# Patient Record
Sex: Male | Born: 1940 | Race: Black or African American | Hispanic: No | Smoking: Never smoker
Health system: Southern US, Community
[De-identification: ages and names within clinical notes are randomized; demographics above are authoritative.]

## PROBLEM LIST (undated history)

## (undated) DIAGNOSIS — E119 Type 2 diabetes mellitus without complications: Secondary | ICD-10-CM

## (undated) DIAGNOSIS — R519 Headache, unspecified: Secondary | ICD-10-CM

## (undated) DIAGNOSIS — R351 Nocturia: Secondary | ICD-10-CM

## (undated) DIAGNOSIS — N401 Enlarged prostate with lower urinary tract symptoms: Secondary | ICD-10-CM

## (undated) DIAGNOSIS — H409 Unspecified glaucoma: Secondary | ICD-10-CM

## (undated) DIAGNOSIS — M1990 Unspecified osteoarthritis, unspecified site: Secondary | ICD-10-CM

## (undated) DIAGNOSIS — N138 Other obstructive and reflux uropathy: Secondary | ICD-10-CM

## (undated) DIAGNOSIS — N210 Calculus in bladder: Secondary | ICD-10-CM

## (undated) DIAGNOSIS — C61 Malignant neoplasm of prostate: Secondary | ICD-10-CM

## (undated) DIAGNOSIS — R51 Headache: Secondary | ICD-10-CM

## (undated) DIAGNOSIS — N481 Balanitis: Secondary | ICD-10-CM

## (undated) HISTORY — DX: Benign prostatic hyperplasia with lower urinary tract symptoms: N13.8

## (undated) HISTORY — PX: COLONOSCOPY: SHX174

## (undated) HISTORY — DX: Unspecified glaucoma: H40.9

## (undated) HISTORY — DX: Malignant neoplasm of prostate: C61

## (undated) HISTORY — DX: Calculus in bladder: N21.0

## (undated) HISTORY — DX: Other obstructive and reflux uropathy: N40.1

## (undated) HISTORY — PX: EYE SURGERY: SHX253

## (undated) HISTORY — DX: Nocturia: R35.1

## (undated) HISTORY — DX: Balanitis: N48.1

## (undated) HISTORY — DX: Type 2 diabetes mellitus without complications: E11.9

---
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
## 1965-11-02 HISTORY — PX: HERNIA REPAIR: SHX51

## 2000-11-02 HISTORY — PX: CARDIAC CATHETERIZATION: SHX172

## 2004-08-02 ENCOUNTER — Encounter: Payer: Self-pay | Admitting: Internal Medicine

## 2004-09-02 ENCOUNTER — Encounter: Payer: Self-pay | Admitting: Internal Medicine

## 2004-10-06 ENCOUNTER — Encounter: Payer: Self-pay | Admitting: Internal Medicine

## 2004-10-08 ENCOUNTER — Ambulatory Visit: Payer: Self-pay

## 2004-11-02 ENCOUNTER — Encounter: Payer: Self-pay | Admitting: Internal Medicine

## 2004-12-03 ENCOUNTER — Encounter: Payer: Self-pay | Admitting: Internal Medicine

## 2004-12-31 ENCOUNTER — Encounter: Payer: Self-pay | Admitting: Internal Medicine

## 2005-01-31 ENCOUNTER — Encounter: Payer: Self-pay | Admitting: Internal Medicine

## 2005-03-02 ENCOUNTER — Encounter: Payer: Self-pay | Admitting: Internal Medicine

## 2005-04-02 ENCOUNTER — Encounter: Payer: Self-pay | Admitting: Internal Medicine

## 2005-05-02 ENCOUNTER — Encounter: Payer: Self-pay | Admitting: Internal Medicine

## 2005-06-02 ENCOUNTER — Encounter: Payer: Self-pay | Admitting: Internal Medicine

## 2005-07-03 ENCOUNTER — Encounter: Payer: Self-pay | Admitting: Internal Medicine

## 2005-08-02 ENCOUNTER — Encounter: Payer: Self-pay | Admitting: Internal Medicine

## 2005-09-02 ENCOUNTER — Encounter: Payer: Self-pay | Admitting: Internal Medicine

## 2005-10-02 ENCOUNTER — Encounter: Payer: Self-pay | Admitting: Internal Medicine

## 2005-11-02 ENCOUNTER — Encounter: Payer: Self-pay | Admitting: Internal Medicine

## 2005-12-03 ENCOUNTER — Encounter: Payer: Self-pay | Admitting: Internal Medicine

## 2005-12-31 ENCOUNTER — Encounter: Payer: Self-pay | Admitting: Internal Medicine

## 2006-01-31 ENCOUNTER — Encounter: Payer: Self-pay | Admitting: Internal Medicine

## 2006-03-02 ENCOUNTER — Encounter: Payer: Self-pay | Admitting: Internal Medicine

## 2006-04-02 ENCOUNTER — Encounter: Payer: Self-pay | Admitting: Internal Medicine

## 2007-09-12 DIAGNOSIS — I10 Essential (primary) hypertension: Secondary | ICD-10-CM | POA: Insufficient documentation

## 2008-01-04 ENCOUNTER — Emergency Department: Payer: Self-pay | Admitting: Emergency Medicine

## 2008-01-12 DIAGNOSIS — R6882 Decreased libido: Secondary | ICD-10-CM | POA: Insufficient documentation

## 2008-02-21 ENCOUNTER — Ambulatory Visit (HOSPITAL_COMMUNITY): Admission: RE | Admit: 2008-02-21 | Discharge: 2008-02-21 | Payer: Self-pay | Admitting: Specialist

## 2009-02-11 DIAGNOSIS — N400 Enlarged prostate without lower urinary tract symptoms: Secondary | ICD-10-CM | POA: Insufficient documentation

## 2009-04-03 DIAGNOSIS — I2510 Atherosclerotic heart disease of native coronary artery without angina pectoris: Secondary | ICD-10-CM | POA: Insufficient documentation

## 2009-06-21 DIAGNOSIS — K2100 Gastro-esophageal reflux disease with esophagitis, without bleeding: Secondary | ICD-10-CM | POA: Insufficient documentation

## 2010-05-13 ENCOUNTER — Ambulatory Visit: Payer: Self-pay | Admitting: Family Medicine

## 2010-06-25 ENCOUNTER — Ambulatory Visit: Payer: Self-pay | Admitting: Ophthalmology

## 2010-08-20 DIAGNOSIS — R634 Abnormal weight loss: Secondary | ICD-10-CM | POA: Insufficient documentation

## 2011-02-25 LAB — HM COLONOSCOPY: HM Colonoscopy: ABNORMAL

## 2011-03-17 NOTE — Procedures (Signed)
EEG NUMBER:  [id number].   HISTORY:  The patient is a 70 year old with a post concussive headache  and occipital neuralgia.  The patient fell on ice January 04, 2008,  striking the back of his head and the right side and has had a daily  headache since that time. (784.0)   PROCEDURE:  The tracing is carried out on a 32-channel digital Cadwell  recorder reformatted into 16 channel montages with 1 devoted to EKG.  The patient was awake and asleep during the recording.  Medications  include Fortamet , Zetia and Altace.   DESCRIPTION OF FINDINGS:  The dominant frequency is a 9 Hz, 40-50  microvolt activity that is well-regulated.  Background activity is  predominantly alpha and beta range components with essentially  predominant 10 Hz, 80 microvolt activity.  The patient drifts into  natural sleep with polymorphic delta range activity, vertex sharp waves  and fragmentary sleep spindles.  Prior to that the patient undergoes  hyperventilation, which caused rhythmic slowing into the delta range.   There was no interictal epileptiform activity in the form of spikes or  sharp waves.  EKG showed a regular sinus rhythm with ventricular  responsive 66 beats per minute.   IMPRESSION:  Normal record with the patient awake and asleep.      Deanna Artis. Sharene Skeans, M.D.  Electronically Signed     ZOX:[id number]  D:  02/21/2008 20:57:04  T:  02/21/2008 21:57:08  Job #:  [id number]   cc:   Annia Friendly. Loleta Chance, MD  Fax: [phone]

## 2011-04-08 ENCOUNTER — Ambulatory Visit: Payer: Self-pay | Admitting: Family Medicine

## 2011-10-24 ENCOUNTER — Emergency Department: Payer: Self-pay | Admitting: Emergency Medicine

## 2011-11-06 DIAGNOSIS — N210 Calculus in bladder: Secondary | ICD-10-CM | POA: Diagnosis not present

## 2011-11-25 DIAGNOSIS — K4020 Bilateral inguinal hernia, without obstruction or gangrene, not specified as recurrent: Secondary | ICD-10-CM | POA: Diagnosis not present

## 2011-12-16 DIAGNOSIS — N210 Calculus in bladder: Secondary | ICD-10-CM | POA: Diagnosis not present

## 2011-12-16 DIAGNOSIS — N200 Calculus of kidney: Secondary | ICD-10-CM | POA: Diagnosis not present

## 2011-12-16 DIAGNOSIS — R310 Gross hematuria: Secondary | ICD-10-CM | POA: Diagnosis not present

## 2011-12-16 DIAGNOSIS — N281 Cyst of kidney, acquired: Secondary | ICD-10-CM | POA: Diagnosis not present

## 2012-01-12 DIAGNOSIS — E119 Type 2 diabetes mellitus without complications: Secondary | ICD-10-CM | POA: Diagnosis not present

## 2012-01-12 DIAGNOSIS — J018 Other acute sinusitis: Secondary | ICD-10-CM | POA: Diagnosis not present

## 2012-01-12 DIAGNOSIS — H6690 Otitis media, unspecified, unspecified ear: Secondary | ICD-10-CM | POA: Diagnosis not present

## 2012-01-12 DIAGNOSIS — J029 Acute pharyngitis, unspecified: Secondary | ICD-10-CM | POA: Diagnosis not present

## 2012-01-13 DIAGNOSIS — M79609 Pain in unspecified limb: Secondary | ICD-10-CM | POA: Diagnosis not present

## 2012-01-13 DIAGNOSIS — B351 Tinea unguium: Secondary | ICD-10-CM | POA: Diagnosis not present

## 2012-01-14 DIAGNOSIS — H4010X0 Unspecified open-angle glaucoma, stage unspecified: Secondary | ICD-10-CM | POA: Diagnosis not present

## 2012-01-18 DIAGNOSIS — H6690 Otitis media, unspecified, unspecified ear: Secondary | ICD-10-CM | POA: Diagnosis not present

## 2012-02-02 DIAGNOSIS — E7800 Pure hypercholesterolemia, unspecified: Secondary | ICD-10-CM | POA: Diagnosis not present

## 2012-02-02 DIAGNOSIS — E119 Type 2 diabetes mellitus without complications: Secondary | ICD-10-CM | POA: Diagnosis not present

## 2012-02-02 DIAGNOSIS — I10 Essential (primary) hypertension: Secondary | ICD-10-CM | POA: Diagnosis not present

## 2012-02-02 DIAGNOSIS — N400 Enlarged prostate without lower urinary tract symptoms: Secondary | ICD-10-CM | POA: Diagnosis not present

## 2012-02-03 DIAGNOSIS — H2510 Age-related nuclear cataract, unspecified eye: Secondary | ICD-10-CM | POA: Diagnosis not present

## 2012-02-05 ENCOUNTER — Ambulatory Visit: Payer: Self-pay | Admitting: Family Medicine

## 2012-02-05 DIAGNOSIS — R918 Other nonspecific abnormal finding of lung field: Secondary | ICD-10-CM | POA: Diagnosis not present

## 2012-02-05 DIAGNOSIS — R911 Solitary pulmonary nodule: Secondary | ICD-10-CM | POA: Diagnosis not present

## 2012-02-09 DIAGNOSIS — K4090 Unilateral inguinal hernia, without obstruction or gangrene, not specified as recurrent: Secondary | ICD-10-CM | POA: Diagnosis not present

## 2012-02-10 ENCOUNTER — Ambulatory Visit: Payer: Self-pay | Admitting: Ophthalmology

## 2012-02-10 DIAGNOSIS — Z79899 Other long term (current) drug therapy: Secondary | ICD-10-CM | POA: Diagnosis not present

## 2012-02-10 DIAGNOSIS — E7800 Pure hypercholesterolemia, unspecified: Secondary | ICD-10-CM | POA: Diagnosis not present

## 2012-02-10 DIAGNOSIS — R12 Heartburn: Secondary | ICD-10-CM | POA: Diagnosis not present

## 2012-02-10 DIAGNOSIS — Z7982 Long term (current) use of aspirin: Secondary | ICD-10-CM | POA: Diagnosis not present

## 2012-02-10 DIAGNOSIS — H5703 Miosis: Secondary | ICD-10-CM | POA: Diagnosis not present

## 2012-02-10 DIAGNOSIS — M542 Cervicalgia: Secondary | ICD-10-CM | POA: Diagnosis not present

## 2012-02-10 DIAGNOSIS — I2510 Atherosclerotic heart disease of native coronary artery without angina pectoris: Secondary | ICD-10-CM | POA: Diagnosis not present

## 2012-02-10 DIAGNOSIS — M47812 Spondylosis without myelopathy or radiculopathy, cervical region: Secondary | ICD-10-CM | POA: Diagnosis not present

## 2012-02-10 DIAGNOSIS — Z9109 Other allergy status, other than to drugs and biological substances: Secondary | ICD-10-CM | POA: Diagnosis not present

## 2012-02-10 DIAGNOSIS — H2510 Age-related nuclear cataract, unspecified eye: Secondary | ICD-10-CM | POA: Diagnosis not present

## 2012-02-10 DIAGNOSIS — Z9861 Coronary angioplasty status: Secondary | ICD-10-CM | POA: Diagnosis not present

## 2012-02-10 DIAGNOSIS — K219 Gastro-esophageal reflux disease without esophagitis: Secondary | ICD-10-CM | POA: Diagnosis not present

## 2012-03-16 DIAGNOSIS — N411 Chronic prostatitis: Secondary | ICD-10-CM | POA: Diagnosis not present

## 2012-03-16 DIAGNOSIS — N410 Acute prostatitis: Secondary | ICD-10-CM | POA: Diagnosis not present

## 2012-03-16 DIAGNOSIS — K4090 Unilateral inguinal hernia, without obstruction or gangrene, not specified as recurrent: Secondary | ICD-10-CM | POA: Diagnosis not present

## 2012-03-16 DIAGNOSIS — N400 Enlarged prostate without lower urinary tract symptoms: Secondary | ICD-10-CM | POA: Diagnosis not present

## 2012-03-16 DIAGNOSIS — H20039 Secondary infectious iridocyclitis, unspecified eye: Secondary | ICD-10-CM | POA: Diagnosis not present

## 2012-03-24 DIAGNOSIS — H20049 Secondary noninfectious iridocyclitis, unspecified eye: Secondary | ICD-10-CM | POA: Diagnosis not present

## 2012-04-06 DIAGNOSIS — S0180XA Unspecified open wound of other part of head, initial encounter: Secondary | ICD-10-CM | POA: Diagnosis not present

## 2012-04-06 DIAGNOSIS — W2209XA Striking against other stationary object, initial encounter: Secondary | ICD-10-CM | POA: Diagnosis not present

## 2012-04-08 DIAGNOSIS — I2510 Atherosclerotic heart disease of native coronary artery without angina pectoris: Secondary | ICD-10-CM | POA: Diagnosis not present

## 2012-04-08 DIAGNOSIS — R0609 Other forms of dyspnea: Secondary | ICD-10-CM | POA: Diagnosis not present

## 2012-04-08 DIAGNOSIS — R011 Cardiac murmur, unspecified: Secondary | ICD-10-CM | POA: Diagnosis not present

## 2012-04-18 DIAGNOSIS — B351 Tinea unguium: Secondary | ICD-10-CM | POA: Diagnosis not present

## 2012-04-18 DIAGNOSIS — M79609 Pain in unspecified limb: Secondary | ICD-10-CM | POA: Diagnosis not present

## 2012-05-06 DIAGNOSIS — H4010X0 Unspecified open-angle glaucoma, stage unspecified: Secondary | ICD-10-CM | POA: Diagnosis not present

## 2012-05-06 DIAGNOSIS — I2510 Atherosclerotic heart disease of native coronary artery without angina pectoris: Secondary | ICD-10-CM | POA: Diagnosis not present

## 2012-05-06 DIAGNOSIS — E7800 Pure hypercholesterolemia, unspecified: Secondary | ICD-10-CM | POA: Diagnosis not present

## 2012-05-06 DIAGNOSIS — I10 Essential (primary) hypertension: Secondary | ICD-10-CM | POA: Diagnosis not present

## 2012-06-02 DIAGNOSIS — M542 Cervicalgia: Secondary | ICD-10-CM | POA: Diagnosis not present

## 2012-06-02 DIAGNOSIS — R490 Dysphonia: Secondary | ICD-10-CM | POA: Diagnosis not present

## 2012-06-06 DIAGNOSIS — H4010X0 Unspecified open-angle glaucoma, stage unspecified: Secondary | ICD-10-CM | POA: Diagnosis not present

## 2012-06-13 ENCOUNTER — Observation Stay: Payer: Self-pay | Admitting: Internal Medicine

## 2012-06-13 DIAGNOSIS — H409 Unspecified glaucoma: Secondary | ICD-10-CM | POA: Diagnosis not present

## 2012-06-13 DIAGNOSIS — Z87820 Personal history of traumatic brain injury: Secondary | ICD-10-CM | POA: Diagnosis not present

## 2012-06-13 DIAGNOSIS — E119 Type 2 diabetes mellitus without complications: Secondary | ICD-10-CM | POA: Diagnosis not present

## 2012-06-13 DIAGNOSIS — N400 Enlarged prostate without lower urinary tract symptoms: Secondary | ICD-10-CM | POA: Diagnosis not present

## 2012-06-13 DIAGNOSIS — Z79899 Other long term (current) drug therapy: Secondary | ICD-10-CM | POA: Diagnosis not present

## 2012-06-13 DIAGNOSIS — E785 Hyperlipidemia, unspecified: Secondary | ICD-10-CM | POA: Diagnosis not present

## 2012-06-13 DIAGNOSIS — R55 Syncope and collapse: Secondary | ICD-10-CM | POA: Diagnosis not present

## 2012-06-13 DIAGNOSIS — Z880 Allergy status to penicillin: Secondary | ICD-10-CM | POA: Diagnosis not present

## 2012-06-13 DIAGNOSIS — I10 Essential (primary) hypertension: Secondary | ICD-10-CM | POA: Diagnosis not present

## 2012-06-13 LAB — CBC
HCT: 42.2 % (ref 40.0–52.0)
HGB: 14.3 g/dL (ref 13.0–18.0)
MCH: 29.9 pg (ref 26.0–34.0)
MCHC: 33.8 g/dL (ref 32.0–36.0)
MCV: 89 fL (ref 80–100)
Platelet: 209 10*3/uL (ref 150–440)
RBC: 4.77 10*6/uL (ref 4.40–5.90)
RDW: 13.7 % (ref 11.5–14.5)
WBC: 4.6 10*3/uL (ref 3.8–10.6)

## 2012-06-13 LAB — COMPREHENSIVE METABOLIC PANEL
Albumin: 3.9 g/dL (ref 3.4–5.0)
Alkaline Phosphatase: 90 U/L (ref 50–136)
Anion Gap: 11 (ref 7–16)
BUN: 15 mg/dL (ref 7–18)
Bilirubin,Total: 0.4 mg/dL (ref 0.2–1.0)
Calcium, Total: 8.9 mg/dL (ref 8.5–10.1)
Chloride: 103 mmol/L (ref 98–107)
Co2: 25 mmol/L (ref 21–32)
Creatinine: 0.9 mg/dL (ref 0.60–1.30)
EGFR (African American): >60
EGFR (Non-African Amer.): >60
Glucose: 180 mg/dL — ABNORMAL HIGH (ref 65–99)
Osmolality: 283 (ref 275–301)
Potassium: 3.5 mmol/L (ref 3.5–5.1)
SGOT(AST): 29 U/L (ref 15–37)
SGPT (ALT): 20 U/L (ref 12–78)
Sodium: 139 mmol/L (ref 136–145)
Total Protein: 7.7 g/dL (ref 6.4–8.2)

## 2012-06-13 LAB — URINALYSIS, COMPLETE
Bacteria: NONE SEEN
Bilirubin,UR: NEGATIVE
Blood: NEGATIVE
Glucose,UR: 50 mg/dL (ref 0–75)
Leukocyte Esterase: NEGATIVE
Nitrite: NEGATIVE
Ph: 5 (ref 4.5–8.0)
Protein: NEGATIVE
RBC,UR: 1 /HPF (ref 0–5)
Specific Gravity: 1.021 (ref 1.003–1.030)
Squamous Epithelial: <1
WBC UR: <1 /HPF (ref 0–5)

## 2012-06-13 LAB — TROPONIN I: Troponin-I: <0.02 ng/mL

## 2012-06-14 DIAGNOSIS — R55 Syncope and collapse: Secondary | ICD-10-CM | POA: Diagnosis not present

## 2012-06-14 DIAGNOSIS — E119 Type 2 diabetes mellitus without complications: Secondary | ICD-10-CM | POA: Diagnosis not present

## 2012-06-14 DIAGNOSIS — I658 Occlusion and stenosis of other precerebral arteries: Secondary | ICD-10-CM | POA: Diagnosis not present

## 2012-06-14 DIAGNOSIS — I6529 Occlusion and stenosis of unspecified carotid artery: Secondary | ICD-10-CM | POA: Diagnosis not present

## 2012-06-14 DIAGNOSIS — I10 Essential (primary) hypertension: Secondary | ICD-10-CM | POA: Diagnosis not present

## 2012-06-14 DIAGNOSIS — N400 Enlarged prostate without lower urinary tract symptoms: Secondary | ICD-10-CM | POA: Diagnosis not present

## 2012-06-14 LAB — CK TOTAL AND CKMB (NOT AT ARMC)
CK, Total: 137 U/L (ref 35–232)
CK, Total: 129 U/L (ref 35–232)
CK, Total: 122 U/L (ref 35–232)
CK-MB: 1.9 ng/mL (ref 0.5–3.6)
CK-MB: 1.9 ng/mL (ref 0.5–3.6)
CK-MB: 1.6 ng/mL (ref 0.5–3.6)

## 2012-06-14 LAB — TROPONIN I
Troponin-I: <0.02 ng/mL
Troponin-I: <0.02 ng/mL

## 2012-06-15 DIAGNOSIS — I10 Essential (primary) hypertension: Secondary | ICD-10-CM | POA: Diagnosis not present

## 2012-06-15 DIAGNOSIS — I2510 Atherosclerotic heart disease of native coronary artery without angina pectoris: Secondary | ICD-10-CM | POA: Diagnosis not present

## 2012-06-15 DIAGNOSIS — E7800 Pure hypercholesterolemia, unspecified: Secondary | ICD-10-CM | POA: Diagnosis not present

## 2012-06-15 DIAGNOSIS — B356 Tinea cruris: Secondary | ICD-10-CM | POA: Diagnosis not present

## 2012-06-21 DIAGNOSIS — F529 Unspecified sexual dysfunction not due to a substance or known physiological condition: Secondary | ICD-10-CM | POA: Diagnosis not present

## 2012-06-21 DIAGNOSIS — N400 Enlarged prostate without lower urinary tract symptoms: Secondary | ICD-10-CM | POA: Diagnosis not present

## 2012-07-13 DIAGNOSIS — R490 Dysphonia: Secondary | ICD-10-CM | POA: Diagnosis not present

## 2012-07-13 DIAGNOSIS — M542 Cervicalgia: Secondary | ICD-10-CM | POA: Diagnosis not present

## 2012-07-15 ENCOUNTER — Encounter: Payer: Self-pay | Admitting: Unknown Physician Specialty

## 2012-07-15 DIAGNOSIS — R490 Dysphonia: Secondary | ICD-10-CM | POA: Diagnosis not present

## 2012-07-15 DIAGNOSIS — IMO0001 Reserved for inherently not codable concepts without codable children: Secondary | ICD-10-CM | POA: Diagnosis not present

## 2012-07-21 DIAGNOSIS — R351 Nocturia: Secondary | ICD-10-CM | POA: Diagnosis not present

## 2012-07-21 DIAGNOSIS — F529 Unspecified sexual dysfunction not due to a substance or known physiological condition: Secondary | ICD-10-CM | POA: Diagnosis not present

## 2012-07-21 DIAGNOSIS — N400 Enlarged prostate without lower urinary tract symptoms: Secondary | ICD-10-CM | POA: Diagnosis not present

## 2012-07-22 DIAGNOSIS — M7989 Other specified soft tissue disorders: Secondary | ICD-10-CM | POA: Diagnosis not present

## 2012-07-22 DIAGNOSIS — Z23 Encounter for immunization: Secondary | ICD-10-CM | POA: Diagnosis not present

## 2012-07-22 DIAGNOSIS — R42 Dizziness and giddiness: Secondary | ICD-10-CM | POA: Diagnosis not present

## 2012-07-22 DIAGNOSIS — E7800 Pure hypercholesterolemia, unspecified: Secondary | ICD-10-CM | POA: Diagnosis not present

## 2012-07-22 DIAGNOSIS — I10 Essential (primary) hypertension: Secondary | ICD-10-CM | POA: Diagnosis not present

## 2012-07-27 DIAGNOSIS — R4789 Other speech disturbances: Secondary | ICD-10-CM | POA: Diagnosis not present

## 2012-07-27 DIAGNOSIS — M542 Cervicalgia: Secondary | ICD-10-CM | POA: Diagnosis not present

## 2012-07-27 DIAGNOSIS — R51 Headache: Secondary | ICD-10-CM | POA: Diagnosis not present

## 2012-08-02 ENCOUNTER — Encounter: Payer: Self-pay | Admitting: Unknown Physician Specialty

## 2012-08-02 DIAGNOSIS — R911 Solitary pulmonary nodule: Secondary | ICD-10-CM | POA: Diagnosis not present

## 2012-08-02 DIAGNOSIS — R490 Dysphonia: Secondary | ICD-10-CM | POA: Diagnosis not present

## 2012-08-02 DIAGNOSIS — IMO0001 Reserved for inherently not codable concepts without codable children: Secondary | ICD-10-CM | POA: Diagnosis not present

## 2012-08-08 ENCOUNTER — Ambulatory Visit: Payer: Self-pay | Admitting: Family Medicine

## 2012-08-08 DIAGNOSIS — R911 Solitary pulmonary nodule: Secondary | ICD-10-CM | POA: Diagnosis not present

## 2012-08-08 LAB — CREATININE, SERUM
Creatinine: 0.84 mg/dL (ref 0.60–1.30)
EGFR (African American): >60
EGFR (Non-African Amer.): >60

## 2012-08-15 DIAGNOSIS — B351 Tinea unguium: Secondary | ICD-10-CM | POA: Diagnosis not present

## 2012-08-15 DIAGNOSIS — M79609 Pain in unspecified limb: Secondary | ICD-10-CM | POA: Diagnosis not present

## 2012-08-24 DIAGNOSIS — M542 Cervicalgia: Secondary | ICD-10-CM | POA: Diagnosis not present

## 2012-08-24 DIAGNOSIS — R490 Dysphonia: Secondary | ICD-10-CM | POA: Diagnosis not present

## 2012-09-06 DIAGNOSIS — I2510 Atherosclerotic heart disease of native coronary artery without angina pectoris: Secondary | ICD-10-CM | POA: Diagnosis not present

## 2012-09-06 DIAGNOSIS — I10 Essential (primary) hypertension: Secondary | ICD-10-CM | POA: Diagnosis not present

## 2012-09-06 DIAGNOSIS — E7800 Pure hypercholesterolemia, unspecified: Secondary | ICD-10-CM | POA: Diagnosis not present

## 2012-09-07 DIAGNOSIS — R51 Headache: Secondary | ICD-10-CM | POA: Diagnosis not present

## 2012-09-07 DIAGNOSIS — IMO0001 Reserved for inherently not codable concepts without codable children: Secondary | ICD-10-CM | POA: Diagnosis not present

## 2012-09-07 DIAGNOSIS — M542 Cervicalgia: Secondary | ICD-10-CM | POA: Diagnosis not present

## 2012-09-16 ENCOUNTER — Ambulatory Visit: Payer: Self-pay | Admitting: Neurology

## 2012-09-16 DIAGNOSIS — M4802 Spinal stenosis, cervical region: Secondary | ICD-10-CM | POA: Diagnosis not present

## 2012-09-16 DIAGNOSIS — M47812 Spondylosis without myelopathy or radiculopathy, cervical region: Secondary | ICD-10-CM | POA: Diagnosis not present

## 2012-09-16 DIAGNOSIS — M5020 Other cervical disc displacement, unspecified cervical region: Secondary | ICD-10-CM | POA: Diagnosis not present

## 2012-09-16 DIAGNOSIS — R51 Headache: Secondary | ICD-10-CM | POA: Diagnosis not present

## 2012-09-17 ENCOUNTER — Encounter: Payer: Self-pay | Admitting: Unknown Physician Specialty

## 2012-09-22 DIAGNOSIS — R51 Headache: Secondary | ICD-10-CM | POA: Diagnosis not present

## 2012-09-22 DIAGNOSIS — E785 Hyperlipidemia, unspecified: Secondary | ICD-10-CM | POA: Diagnosis not present

## 2012-09-22 DIAGNOSIS — M531 Cervicobrachial syndrome: Secondary | ICD-10-CM | POA: Diagnosis not present

## 2012-09-22 DIAGNOSIS — I10 Essential (primary) hypertension: Secondary | ICD-10-CM | POA: Diagnosis not present

## 2012-10-20 DIAGNOSIS — E291 Testicular hypofunction: Secondary | ICD-10-CM | POA: Diagnosis not present

## 2012-10-20 DIAGNOSIS — N210 Calculus in bladder: Secondary | ICD-10-CM | POA: Diagnosis not present

## 2012-10-20 DIAGNOSIS — Z125 Encounter for screening for malignant neoplasm of prostate: Secondary | ICD-10-CM | POA: Diagnosis not present

## 2012-10-20 DIAGNOSIS — N400 Enlarged prostate without lower urinary tract symptoms: Secondary | ICD-10-CM | POA: Diagnosis not present

## 2012-11-15 DIAGNOSIS — M531 Cervicobrachial syndrome: Secondary | ICD-10-CM | POA: Diagnosis not present

## 2012-11-15 DIAGNOSIS — M542 Cervicalgia: Secondary | ICD-10-CM | POA: Diagnosis not present

## 2012-11-15 DIAGNOSIS — R51 Headache: Secondary | ICD-10-CM | POA: Diagnosis not present

## 2012-11-15 DIAGNOSIS — IMO0001 Reserved for inherently not codable concepts without codable children: Secondary | ICD-10-CM | POA: Diagnosis not present

## 2012-11-16 DIAGNOSIS — B351 Tinea unguium: Secondary | ICD-10-CM | POA: Diagnosis not present

## 2012-11-16 DIAGNOSIS — M79609 Pain in unspecified limb: Secondary | ICD-10-CM | POA: Diagnosis not present

## 2012-11-28 DIAGNOSIS — R011 Cardiac murmur, unspecified: Secondary | ICD-10-CM | POA: Diagnosis not present

## 2012-11-28 DIAGNOSIS — I10 Essential (primary) hypertension: Secondary | ICD-10-CM | POA: Diagnosis not present

## 2012-11-28 DIAGNOSIS — J00 Acute nasopharyngitis [common cold]: Secondary | ICD-10-CM | POA: Diagnosis not present

## 2012-11-29 DIAGNOSIS — I2510 Atherosclerotic heart disease of native coronary artery without angina pectoris: Secondary | ICD-10-CM | POA: Diagnosis not present

## 2012-11-29 DIAGNOSIS — I428 Other cardiomyopathies: Secondary | ICD-10-CM | POA: Diagnosis not present

## 2012-11-29 DIAGNOSIS — R011 Cardiac murmur, unspecified: Secondary | ICD-10-CM | POA: Diagnosis not present

## 2012-12-05 DIAGNOSIS — H4010X0 Unspecified open-angle glaucoma, stage unspecified: Secondary | ICD-10-CM | POA: Diagnosis not present

## 2012-12-06 DIAGNOSIS — M542 Cervicalgia: Secondary | ICD-10-CM | POA: Diagnosis not present

## 2012-12-06 DIAGNOSIS — M531 Cervicobrachial syndrome: Secondary | ICD-10-CM | POA: Diagnosis not present

## 2012-12-06 DIAGNOSIS — E119 Type 2 diabetes mellitus without complications: Secondary | ICD-10-CM | POA: Diagnosis not present

## 2012-12-06 DIAGNOSIS — R51 Headache: Secondary | ICD-10-CM | POA: Diagnosis not present

## 2013-01-03 ENCOUNTER — Emergency Department: Payer: Self-pay | Admitting: Emergency Medicine

## 2013-01-03 DIAGNOSIS — R079 Chest pain, unspecified: Secondary | ICD-10-CM | POA: Diagnosis not present

## 2013-01-03 DIAGNOSIS — Z79899 Other long term (current) drug therapy: Secondary | ICD-10-CM | POA: Diagnosis not present

## 2013-01-03 DIAGNOSIS — IMO0002 Reserved for concepts with insufficient information to code with codable children: Secondary | ICD-10-CM | POA: Diagnosis not present

## 2013-01-03 DIAGNOSIS — R55 Syncope and collapse: Secondary | ICD-10-CM | POA: Diagnosis not present

## 2013-01-03 DIAGNOSIS — E119 Type 2 diabetes mellitus without complications: Secondary | ICD-10-CM | POA: Diagnosis not present

## 2013-01-03 DIAGNOSIS — R5381 Other malaise: Secondary | ICD-10-CM | POA: Diagnosis not present

## 2013-01-03 LAB — COMPREHENSIVE METABOLIC PANEL
Albumin: 3.7 g/dL (ref 3.4–5.0)
Alkaline Phosphatase: 83 U/L (ref 50–136)
Anion Gap: 4 — ABNORMAL LOW (ref 7–16)
BUN: 18 mg/dL (ref 7–18)
Bilirubin,Total: 0.3 mg/dL (ref 0.2–1.0)
Calcium, Total: 8.8 mg/dL (ref 8.5–10.1)
Chloride: 104 mmol/L (ref 98–107)
Co2: 30 mmol/L (ref 21–32)
Creatinine: 0.84 mg/dL (ref 0.60–1.30)
EGFR (African American): >60
EGFR (Non-African Amer.): >60
Glucose: 185 mg/dL — ABNORMAL HIGH (ref 65–99)
Osmolality: 282 (ref 275–301)
Potassium: 3.8 mmol/L (ref 3.5–5.1)
SGOT(AST): 23 U/L (ref 15–37)
SGPT (ALT): 20 U/L (ref 12–78)
Sodium: 138 mmol/L (ref 136–145)
Total Protein: 7.8 g/dL (ref 6.4–8.2)

## 2013-01-03 LAB — APTT: Activated PTT: 29.3 secs (ref 23.6–35.9)

## 2013-01-03 LAB — CBC
HCT: 42.9 % (ref 40.0–52.0)
HGB: 13.9 g/dL (ref 13.0–18.0)
MCH: 28.1 pg (ref 26.0–34.0)
MCHC: 32.4 g/dL (ref 32.0–36.0)
MCV: 87 fL (ref 80–100)
Platelet: 192 10*3/uL (ref 150–440)
RBC: 4.96 10*6/uL (ref 4.40–5.90)
RDW: 14.6 % — ABNORMAL HIGH (ref 11.5–14.5)
WBC: 4.9 10*3/uL (ref 3.8–10.6)

## 2013-01-03 LAB — CK TOTAL AND CKMB (NOT AT ARMC)
CK, Total: 113 U/L (ref 35–232)
CK-MB: 1.5 ng/mL (ref 0.5–3.6)

## 2013-01-03 LAB — PROTIME-INR
INR: 0.9
Prothrombin Time: 12.8 secs (ref 11.5–14.7)

## 2013-01-03 LAB — TROPONIN I: Troponin-I: <0.02 ng/mL

## 2013-01-10 DIAGNOSIS — E119 Type 2 diabetes mellitus without complications: Secondary | ICD-10-CM | POA: Diagnosis not present

## 2013-01-10 DIAGNOSIS — R51 Headache: Secondary | ICD-10-CM | POA: Diagnosis not present

## 2013-01-10 DIAGNOSIS — M531 Cervicobrachial syndrome: Secondary | ICD-10-CM | POA: Diagnosis not present

## 2013-01-10 DIAGNOSIS — M542 Cervicalgia: Secondary | ICD-10-CM | POA: Diagnosis not present

## 2013-01-13 DIAGNOSIS — I10 Essential (primary) hypertension: Secondary | ICD-10-CM | POA: Diagnosis not present

## 2013-01-13 DIAGNOSIS — E7800 Pure hypercholesterolemia, unspecified: Secondary | ICD-10-CM | POA: Diagnosis not present

## 2013-01-13 DIAGNOSIS — IMO0001 Reserved for inherently not codable concepts without codable children: Secondary | ICD-10-CM | POA: Diagnosis not present

## 2013-01-16 DIAGNOSIS — E7800 Pure hypercholesterolemia, unspecified: Secondary | ICD-10-CM | POA: Diagnosis not present

## 2013-01-20 DIAGNOSIS — M9981 Other biomechanical lesions of cervical region: Secondary | ICD-10-CM | POA: Diagnosis not present

## 2013-01-20 DIAGNOSIS — M542 Cervicalgia: Secondary | ICD-10-CM | POA: Diagnosis not present

## 2013-01-20 DIAGNOSIS — M546 Pain in thoracic spine: Secondary | ICD-10-CM | POA: Diagnosis not present

## 2013-01-20 DIAGNOSIS — M545 Low back pain, unspecified: Secondary | ICD-10-CM | POA: Diagnosis not present

## 2013-01-20 DIAGNOSIS — M5020 Other cervical disc displacement, unspecified cervical region: Secondary | ICD-10-CM | POA: Diagnosis not present

## 2013-01-20 DIAGNOSIS — M999 Biomechanical lesion, unspecified: Secondary | ICD-10-CM | POA: Diagnosis not present

## 2013-01-23 DIAGNOSIS — M999 Biomechanical lesion, unspecified: Secondary | ICD-10-CM | POA: Diagnosis not present

## 2013-01-23 DIAGNOSIS — M545 Low back pain, unspecified: Secondary | ICD-10-CM | POA: Diagnosis not present

## 2013-01-23 DIAGNOSIS — M546 Pain in thoracic spine: Secondary | ICD-10-CM | POA: Diagnosis not present

## 2013-01-23 DIAGNOSIS — M9981 Other biomechanical lesions of cervical region: Secondary | ICD-10-CM | POA: Diagnosis not present

## 2013-01-23 DIAGNOSIS — M5020 Other cervical disc displacement, unspecified cervical region: Secondary | ICD-10-CM | POA: Diagnosis not present

## 2013-01-23 DIAGNOSIS — M542 Cervicalgia: Secondary | ICD-10-CM | POA: Diagnosis not present

## 2013-01-25 DIAGNOSIS — M545 Low back pain, unspecified: Secondary | ICD-10-CM | POA: Diagnosis not present

## 2013-01-25 DIAGNOSIS — M546 Pain in thoracic spine: Secondary | ICD-10-CM | POA: Diagnosis not present

## 2013-01-25 DIAGNOSIS — M542 Cervicalgia: Secondary | ICD-10-CM | POA: Diagnosis not present

## 2013-01-25 DIAGNOSIS — M999 Biomechanical lesion, unspecified: Secondary | ICD-10-CM | POA: Diagnosis not present

## 2013-01-25 DIAGNOSIS — M9981 Other biomechanical lesions of cervical region: Secondary | ICD-10-CM | POA: Diagnosis not present

## 2013-01-25 DIAGNOSIS — M5020 Other cervical disc displacement, unspecified cervical region: Secondary | ICD-10-CM | POA: Diagnosis not present

## 2013-01-27 DIAGNOSIS — M542 Cervicalgia: Secondary | ICD-10-CM | POA: Diagnosis not present

## 2013-01-27 DIAGNOSIS — M545 Low back pain, unspecified: Secondary | ICD-10-CM | POA: Diagnosis not present

## 2013-01-27 DIAGNOSIS — M5020 Other cervical disc displacement, unspecified cervical region: Secondary | ICD-10-CM | POA: Diagnosis not present

## 2013-01-27 DIAGNOSIS — M999 Biomechanical lesion, unspecified: Secondary | ICD-10-CM | POA: Diagnosis not present

## 2013-01-27 DIAGNOSIS — M546 Pain in thoracic spine: Secondary | ICD-10-CM | POA: Diagnosis not present

## 2013-01-27 DIAGNOSIS — M9981 Other biomechanical lesions of cervical region: Secondary | ICD-10-CM | POA: Diagnosis not present

## 2013-01-30 DIAGNOSIS — M999 Biomechanical lesion, unspecified: Secondary | ICD-10-CM | POA: Diagnosis not present

## 2013-01-30 DIAGNOSIS — M545 Low back pain, unspecified: Secondary | ICD-10-CM | POA: Diagnosis not present

## 2013-01-30 DIAGNOSIS — M546 Pain in thoracic spine: Secondary | ICD-10-CM | POA: Diagnosis not present

## 2013-01-30 DIAGNOSIS — M5020 Other cervical disc displacement, unspecified cervical region: Secondary | ICD-10-CM | POA: Diagnosis not present

## 2013-01-30 DIAGNOSIS — M9981 Other biomechanical lesions of cervical region: Secondary | ICD-10-CM | POA: Diagnosis not present

## 2013-01-30 DIAGNOSIS — M542 Cervicalgia: Secondary | ICD-10-CM | POA: Diagnosis not present

## 2013-02-01 DIAGNOSIS — M546 Pain in thoracic spine: Secondary | ICD-10-CM | POA: Diagnosis not present

## 2013-02-01 DIAGNOSIS — M542 Cervicalgia: Secondary | ICD-10-CM | POA: Diagnosis not present

## 2013-02-01 DIAGNOSIS — M545 Low back pain, unspecified: Secondary | ICD-10-CM | POA: Diagnosis not present

## 2013-02-01 DIAGNOSIS — M999 Biomechanical lesion, unspecified: Secondary | ICD-10-CM | POA: Diagnosis not present

## 2013-02-01 DIAGNOSIS — M9981 Other biomechanical lesions of cervical region: Secondary | ICD-10-CM | POA: Diagnosis not present

## 2013-02-01 DIAGNOSIS — M5020 Other cervical disc displacement, unspecified cervical region: Secondary | ICD-10-CM | POA: Diagnosis not present

## 2013-02-03 DIAGNOSIS — M9981 Other biomechanical lesions of cervical region: Secondary | ICD-10-CM | POA: Diagnosis not present

## 2013-02-03 DIAGNOSIS — M546 Pain in thoracic spine: Secondary | ICD-10-CM | POA: Diagnosis not present

## 2013-02-03 DIAGNOSIS — M545 Low back pain, unspecified: Secondary | ICD-10-CM | POA: Diagnosis not present

## 2013-02-03 DIAGNOSIS — M999 Biomechanical lesion, unspecified: Secondary | ICD-10-CM | POA: Diagnosis not present

## 2013-02-03 DIAGNOSIS — M542 Cervicalgia: Secondary | ICD-10-CM | POA: Diagnosis not present

## 2013-02-03 DIAGNOSIS — M5020 Other cervical disc displacement, unspecified cervical region: Secondary | ICD-10-CM | POA: Diagnosis not present

## 2013-02-06 DIAGNOSIS — M546 Pain in thoracic spine: Secondary | ICD-10-CM | POA: Diagnosis not present

## 2013-02-06 DIAGNOSIS — M5020 Other cervical disc displacement, unspecified cervical region: Secondary | ICD-10-CM | POA: Diagnosis not present

## 2013-02-06 DIAGNOSIS — M542 Cervicalgia: Secondary | ICD-10-CM | POA: Diagnosis not present

## 2013-02-06 DIAGNOSIS — M545 Low back pain, unspecified: Secondary | ICD-10-CM | POA: Diagnosis not present

## 2013-02-06 DIAGNOSIS — M999 Biomechanical lesion, unspecified: Secondary | ICD-10-CM | POA: Diagnosis not present

## 2013-02-06 DIAGNOSIS — M9981 Other biomechanical lesions of cervical region: Secondary | ICD-10-CM | POA: Diagnosis not present

## 2013-02-08 DIAGNOSIS — M545 Low back pain, unspecified: Secondary | ICD-10-CM | POA: Diagnosis not present

## 2013-02-08 DIAGNOSIS — M5020 Other cervical disc displacement, unspecified cervical region: Secondary | ICD-10-CM | POA: Diagnosis not present

## 2013-02-08 DIAGNOSIS — M999 Biomechanical lesion, unspecified: Secondary | ICD-10-CM | POA: Diagnosis not present

## 2013-02-08 DIAGNOSIS — M542 Cervicalgia: Secondary | ICD-10-CM | POA: Diagnosis not present

## 2013-02-08 DIAGNOSIS — M546 Pain in thoracic spine: Secondary | ICD-10-CM | POA: Diagnosis not present

## 2013-02-08 DIAGNOSIS — M9981 Other biomechanical lesions of cervical region: Secondary | ICD-10-CM | POA: Diagnosis not present

## 2013-02-10 DIAGNOSIS — M542 Cervicalgia: Secondary | ICD-10-CM | POA: Diagnosis not present

## 2013-02-10 DIAGNOSIS — M5020 Other cervical disc displacement, unspecified cervical region: Secondary | ICD-10-CM | POA: Diagnosis not present

## 2013-02-10 DIAGNOSIS — M9981 Other biomechanical lesions of cervical region: Secondary | ICD-10-CM | POA: Diagnosis not present

## 2013-02-10 DIAGNOSIS — M545 Low back pain, unspecified: Secondary | ICD-10-CM | POA: Diagnosis not present

## 2013-02-10 DIAGNOSIS — M546 Pain in thoracic spine: Secondary | ICD-10-CM | POA: Diagnosis not present

## 2013-02-10 DIAGNOSIS — M999 Biomechanical lesion, unspecified: Secondary | ICD-10-CM | POA: Diagnosis not present

## 2013-02-13 DIAGNOSIS — M542 Cervicalgia: Secondary | ICD-10-CM | POA: Diagnosis not present

## 2013-02-13 DIAGNOSIS — M545 Low back pain, unspecified: Secondary | ICD-10-CM | POA: Diagnosis not present

## 2013-02-13 DIAGNOSIS — M546 Pain in thoracic spine: Secondary | ICD-10-CM | POA: Diagnosis not present

## 2013-02-13 DIAGNOSIS — M5020 Other cervical disc displacement, unspecified cervical region: Secondary | ICD-10-CM | POA: Diagnosis not present

## 2013-02-13 DIAGNOSIS — M9981 Other biomechanical lesions of cervical region: Secondary | ICD-10-CM | POA: Diagnosis not present

## 2013-02-13 DIAGNOSIS — M999 Biomechanical lesion, unspecified: Secondary | ICD-10-CM | POA: Diagnosis not present

## 2013-02-15 DIAGNOSIS — M5020 Other cervical disc displacement, unspecified cervical region: Secondary | ICD-10-CM | POA: Diagnosis not present

## 2013-02-15 DIAGNOSIS — M545 Low back pain, unspecified: Secondary | ICD-10-CM | POA: Diagnosis not present

## 2013-02-15 DIAGNOSIS — M542 Cervicalgia: Secondary | ICD-10-CM | POA: Diagnosis not present

## 2013-02-15 DIAGNOSIS — M9981 Other biomechanical lesions of cervical region: Secondary | ICD-10-CM | POA: Diagnosis not present

## 2013-02-15 DIAGNOSIS — M999 Biomechanical lesion, unspecified: Secondary | ICD-10-CM | POA: Diagnosis not present

## 2013-02-15 DIAGNOSIS — M546 Pain in thoracic spine: Secondary | ICD-10-CM | POA: Diagnosis not present

## 2013-02-17 DIAGNOSIS — M542 Cervicalgia: Secondary | ICD-10-CM | POA: Diagnosis not present

## 2013-02-17 DIAGNOSIS — M546 Pain in thoracic spine: Secondary | ICD-10-CM | POA: Diagnosis not present

## 2013-02-17 DIAGNOSIS — M545 Low back pain, unspecified: Secondary | ICD-10-CM | POA: Diagnosis not present

## 2013-02-17 DIAGNOSIS — M999 Biomechanical lesion, unspecified: Secondary | ICD-10-CM | POA: Diagnosis not present

## 2013-02-17 DIAGNOSIS — M5020 Other cervical disc displacement, unspecified cervical region: Secondary | ICD-10-CM | POA: Diagnosis not present

## 2013-02-17 DIAGNOSIS — M9981 Other biomechanical lesions of cervical region: Secondary | ICD-10-CM | POA: Diagnosis not present

## 2013-02-22 DIAGNOSIS — M999 Biomechanical lesion, unspecified: Secondary | ICD-10-CM | POA: Diagnosis not present

## 2013-02-22 DIAGNOSIS — M545 Low back pain, unspecified: Secondary | ICD-10-CM | POA: Diagnosis not present

## 2013-02-22 DIAGNOSIS — M542 Cervicalgia: Secondary | ICD-10-CM | POA: Diagnosis not present

## 2013-02-22 DIAGNOSIS — M546 Pain in thoracic spine: Secondary | ICD-10-CM | POA: Diagnosis not present

## 2013-02-22 DIAGNOSIS — M5020 Other cervical disc displacement, unspecified cervical region: Secondary | ICD-10-CM | POA: Diagnosis not present

## 2013-02-22 DIAGNOSIS — M9981 Other biomechanical lesions of cervical region: Secondary | ICD-10-CM | POA: Diagnosis not present

## 2013-02-24 DIAGNOSIS — M5020 Other cervical disc displacement, unspecified cervical region: Secondary | ICD-10-CM | POA: Diagnosis not present

## 2013-02-24 DIAGNOSIS — M542 Cervicalgia: Secondary | ICD-10-CM | POA: Diagnosis not present

## 2013-02-24 DIAGNOSIS — M546 Pain in thoracic spine: Secondary | ICD-10-CM | POA: Diagnosis not present

## 2013-02-24 DIAGNOSIS — M9981 Other biomechanical lesions of cervical region: Secondary | ICD-10-CM | POA: Diagnosis not present

## 2013-02-24 DIAGNOSIS — M999 Biomechanical lesion, unspecified: Secondary | ICD-10-CM | POA: Diagnosis not present

## 2013-02-24 DIAGNOSIS — M545 Low back pain, unspecified: Secondary | ICD-10-CM | POA: Diagnosis not present

## 2013-02-27 DIAGNOSIS — M542 Cervicalgia: Secondary | ICD-10-CM | POA: Diagnosis not present

## 2013-02-27 DIAGNOSIS — M5020 Other cervical disc displacement, unspecified cervical region: Secondary | ICD-10-CM | POA: Diagnosis not present

## 2013-02-27 DIAGNOSIS — M545 Low back pain, unspecified: Secondary | ICD-10-CM | POA: Diagnosis not present

## 2013-02-27 DIAGNOSIS — M546 Pain in thoracic spine: Secondary | ICD-10-CM | POA: Diagnosis not present

## 2013-02-27 DIAGNOSIS — M9981 Other biomechanical lesions of cervical region: Secondary | ICD-10-CM | POA: Diagnosis not present

## 2013-02-27 DIAGNOSIS — M999 Biomechanical lesion, unspecified: Secondary | ICD-10-CM | POA: Diagnosis not present

## 2013-03-01 DIAGNOSIS — M542 Cervicalgia: Secondary | ICD-10-CM | POA: Diagnosis not present

## 2013-03-01 DIAGNOSIS — M546 Pain in thoracic spine: Secondary | ICD-10-CM | POA: Diagnosis not present

## 2013-03-01 DIAGNOSIS — M545 Low back pain, unspecified: Secondary | ICD-10-CM | POA: Diagnosis not present

## 2013-03-01 DIAGNOSIS — M9981 Other biomechanical lesions of cervical region: Secondary | ICD-10-CM | POA: Diagnosis not present

## 2013-03-01 DIAGNOSIS — M5020 Other cervical disc displacement, unspecified cervical region: Secondary | ICD-10-CM | POA: Diagnosis not present

## 2013-03-01 DIAGNOSIS — M999 Biomechanical lesion, unspecified: Secondary | ICD-10-CM | POA: Diagnosis not present

## 2013-03-03 DIAGNOSIS — M542 Cervicalgia: Secondary | ICD-10-CM | POA: Diagnosis not present

## 2013-03-03 DIAGNOSIS — M545 Low back pain, unspecified: Secondary | ICD-10-CM | POA: Diagnosis not present

## 2013-03-03 DIAGNOSIS — I10 Essential (primary) hypertension: Secondary | ICD-10-CM | POA: Diagnosis not present

## 2013-03-03 DIAGNOSIS — M999 Biomechanical lesion, unspecified: Secondary | ICD-10-CM | POA: Diagnosis not present

## 2013-03-03 DIAGNOSIS — R011 Cardiac murmur, unspecified: Secondary | ICD-10-CM | POA: Diagnosis not present

## 2013-03-03 DIAGNOSIS — M5020 Other cervical disc displacement, unspecified cervical region: Secondary | ICD-10-CM | POA: Diagnosis not present

## 2013-03-03 DIAGNOSIS — E119 Type 2 diabetes mellitus without complications: Secondary | ICD-10-CM | POA: Diagnosis not present

## 2013-03-03 DIAGNOSIS — M546 Pain in thoracic spine: Secondary | ICD-10-CM | POA: Diagnosis not present

## 2013-03-03 DIAGNOSIS — M9981 Other biomechanical lesions of cervical region: Secondary | ICD-10-CM | POA: Diagnosis not present

## 2013-03-03 DIAGNOSIS — I2510 Atherosclerotic heart disease of native coronary artery without angina pectoris: Secondary | ICD-10-CM | POA: Diagnosis not present

## 2013-03-07 DIAGNOSIS — M5020 Other cervical disc displacement, unspecified cervical region: Secondary | ICD-10-CM | POA: Diagnosis not present

## 2013-03-07 DIAGNOSIS — M9981 Other biomechanical lesions of cervical region: Secondary | ICD-10-CM | POA: Diagnosis not present

## 2013-03-07 DIAGNOSIS — M999 Biomechanical lesion, unspecified: Secondary | ICD-10-CM | POA: Diagnosis not present

## 2013-03-07 DIAGNOSIS — M545 Low back pain, unspecified: Secondary | ICD-10-CM | POA: Diagnosis not present

## 2013-03-07 DIAGNOSIS — M542 Cervicalgia: Secondary | ICD-10-CM | POA: Diagnosis not present

## 2013-03-07 DIAGNOSIS — M546 Pain in thoracic spine: Secondary | ICD-10-CM | POA: Diagnosis not present

## 2013-03-08 DIAGNOSIS — M531 Cervicobrachial syndrome: Secondary | ICD-10-CM | POA: Diagnosis not present

## 2013-03-08 DIAGNOSIS — M542 Cervicalgia: Secondary | ICD-10-CM | POA: Diagnosis not present

## 2013-03-08 DIAGNOSIS — E119 Type 2 diabetes mellitus without complications: Secondary | ICD-10-CM | POA: Diagnosis not present

## 2013-03-08 DIAGNOSIS — R51 Headache: Secondary | ICD-10-CM | POA: Diagnosis not present

## 2013-03-10 DIAGNOSIS — M999 Biomechanical lesion, unspecified: Secondary | ICD-10-CM | POA: Diagnosis not present

## 2013-03-10 DIAGNOSIS — M9981 Other biomechanical lesions of cervical region: Secondary | ICD-10-CM | POA: Diagnosis not present

## 2013-03-10 DIAGNOSIS — M545 Low back pain, unspecified: Secondary | ICD-10-CM | POA: Diagnosis not present

## 2013-03-10 DIAGNOSIS — M546 Pain in thoracic spine: Secondary | ICD-10-CM | POA: Diagnosis not present

## 2013-03-10 DIAGNOSIS — M5020 Other cervical disc displacement, unspecified cervical region: Secondary | ICD-10-CM | POA: Diagnosis not present

## 2013-03-10 DIAGNOSIS — M542 Cervicalgia: Secondary | ICD-10-CM | POA: Diagnosis not present

## 2013-03-13 DIAGNOSIS — B351 Tinea unguium: Secondary | ICD-10-CM | POA: Diagnosis not present

## 2013-03-13 DIAGNOSIS — M79609 Pain in unspecified limb: Secondary | ICD-10-CM | POA: Diagnosis not present

## 2013-03-14 DIAGNOSIS — M545 Low back pain, unspecified: Secondary | ICD-10-CM | POA: Diagnosis not present

## 2013-03-14 DIAGNOSIS — M546 Pain in thoracic spine: Secondary | ICD-10-CM | POA: Diagnosis not present

## 2013-03-14 DIAGNOSIS — M5020 Other cervical disc displacement, unspecified cervical region: Secondary | ICD-10-CM | POA: Diagnosis not present

## 2013-03-14 DIAGNOSIS — M999 Biomechanical lesion, unspecified: Secondary | ICD-10-CM | POA: Diagnosis not present

## 2013-03-14 DIAGNOSIS — M9981 Other biomechanical lesions of cervical region: Secondary | ICD-10-CM | POA: Diagnosis not present

## 2013-03-14 DIAGNOSIS — M542 Cervicalgia: Secondary | ICD-10-CM | POA: Diagnosis not present

## 2013-03-17 DIAGNOSIS — M542 Cervicalgia: Secondary | ICD-10-CM | POA: Diagnosis not present

## 2013-03-17 DIAGNOSIS — M9981 Other biomechanical lesions of cervical region: Secondary | ICD-10-CM | POA: Diagnosis not present

## 2013-03-17 DIAGNOSIS — M545 Low back pain, unspecified: Secondary | ICD-10-CM | POA: Diagnosis not present

## 2013-03-17 DIAGNOSIS — M5020 Other cervical disc displacement, unspecified cervical region: Secondary | ICD-10-CM | POA: Diagnosis not present

## 2013-03-17 DIAGNOSIS — M546 Pain in thoracic spine: Secondary | ICD-10-CM | POA: Diagnosis not present

## 2013-03-17 DIAGNOSIS — M999 Biomechanical lesion, unspecified: Secondary | ICD-10-CM | POA: Diagnosis not present

## 2013-03-31 DIAGNOSIS — R634 Abnormal weight loss: Secondary | ICD-10-CM | POA: Diagnosis not present

## 2013-03-31 DIAGNOSIS — F0781 Postconcussional syndrome: Secondary | ICD-10-CM | POA: Diagnosis not present

## 2013-03-31 DIAGNOSIS — R51 Headache: Secondary | ICD-10-CM | POA: Diagnosis not present

## 2013-04-06 DIAGNOSIS — H4010X0 Unspecified open-angle glaucoma, stage unspecified: Secondary | ICD-10-CM | POA: Diagnosis not present

## 2013-04-20 DIAGNOSIS — N400 Enlarged prostate without lower urinary tract symptoms: Secondary | ICD-10-CM | POA: Diagnosis not present

## 2013-04-20 DIAGNOSIS — R351 Nocturia: Secondary | ICD-10-CM | POA: Diagnosis not present

## 2013-05-02 DIAGNOSIS — S1096XA Insect bite of unspecified part of neck, initial encounter: Secondary | ICD-10-CM | POA: Diagnosis not present

## 2013-05-17 DIAGNOSIS — IMO0001 Reserved for inherently not codable concepts without codable children: Secondary | ICD-10-CM | POA: Diagnosis not present

## 2013-05-17 DIAGNOSIS — E7800 Pure hypercholesterolemia, unspecified: Secondary | ICD-10-CM | POA: Diagnosis not present

## 2013-05-17 DIAGNOSIS — N529 Male erectile dysfunction, unspecified: Secondary | ICD-10-CM | POA: Diagnosis not present

## 2013-05-17 DIAGNOSIS — I10 Essential (primary) hypertension: Secondary | ICD-10-CM | POA: Diagnosis not present

## 2013-06-12 DIAGNOSIS — B351 Tinea unguium: Secondary | ICD-10-CM | POA: Diagnosis not present

## 2013-06-12 DIAGNOSIS — M79609 Pain in unspecified limb: Secondary | ICD-10-CM | POA: Diagnosis not present

## 2013-06-16 DIAGNOSIS — I10 Essential (primary) hypertension: Secondary | ICD-10-CM | POA: Diagnosis not present

## 2013-06-16 DIAGNOSIS — M531 Cervicobrachial syndrome: Secondary | ICD-10-CM | POA: Diagnosis not present

## 2013-06-16 DIAGNOSIS — E119 Type 2 diabetes mellitus without complications: Secondary | ICD-10-CM | POA: Diagnosis not present

## 2013-06-16 DIAGNOSIS — R51 Headache: Secondary | ICD-10-CM | POA: Diagnosis not present

## 2013-07-11 DIAGNOSIS — Z23 Encounter for immunization: Secondary | ICD-10-CM | POA: Diagnosis not present

## 2013-08-10 DIAGNOSIS — H4010X0 Unspecified open-angle glaucoma, stage unspecified: Secondary | ICD-10-CM | POA: Diagnosis not present

## 2013-08-18 DIAGNOSIS — M542 Cervicalgia: Secondary | ICD-10-CM | POA: Diagnosis not present

## 2013-08-18 DIAGNOSIS — E119 Type 2 diabetes mellitus without complications: Secondary | ICD-10-CM | POA: Diagnosis not present

## 2013-08-18 DIAGNOSIS — I10 Essential (primary) hypertension: Secondary | ICD-10-CM | POA: Diagnosis not present

## 2013-08-18 DIAGNOSIS — R51 Headache: Secondary | ICD-10-CM | POA: Diagnosis not present

## 2013-09-08 DIAGNOSIS — H4010X0 Unspecified open-angle glaucoma, stage unspecified: Secondary | ICD-10-CM | POA: Diagnosis not present

## 2013-09-13 ENCOUNTER — Encounter: Payer: Self-pay | Admitting: Podiatry

## 2013-09-13 ENCOUNTER — Ambulatory Visit (INDEPENDENT_AMBULATORY_CARE_PROVIDER_SITE_OTHER): Payer: Medicare Other | Admitting: Podiatry

## 2013-09-13 VITALS — BP 138/72 | HR 72 | Resp 18 | Ht 69.0 in | Wt 144.0 lb

## 2013-09-13 DIAGNOSIS — B351 Tinea unguium: Secondary | ICD-10-CM

## 2013-09-13 DIAGNOSIS — M79609 Pain in unspecified limb: Secondary | ICD-10-CM | POA: Diagnosis not present

## 2013-09-13 NOTE — Progress Notes (Signed)
Austin Hunt presents today with a chief complaint of painful toenails bilateral.  Objective: Vital signs are stable he is alert and oriented x3. Nails are thick yellow dystrophic clinically mycotic.  Assessment: Pain in limb secondary to onychomycosis bilateral.  Plan: Debridement of nails thickness and length as a covered service secondary to pain followup with him in 3 months

## 2013-09-19 DIAGNOSIS — R5381 Other malaise: Secondary | ICD-10-CM | POA: Diagnosis not present

## 2013-09-19 DIAGNOSIS — E119 Type 2 diabetes mellitus without complications: Secondary | ICD-10-CM | POA: Diagnosis not present

## 2013-09-19 DIAGNOSIS — I10 Essential (primary) hypertension: Secondary | ICD-10-CM | POA: Diagnosis not present

## 2013-09-21 DIAGNOSIS — R5381 Other malaise: Secondary | ICD-10-CM | POA: Diagnosis not present

## 2013-09-21 DIAGNOSIS — M2550 Pain in unspecified joint: Secondary | ICD-10-CM | POA: Diagnosis not present

## 2013-09-21 DIAGNOSIS — I10 Essential (primary) hypertension: Secondary | ICD-10-CM | POA: Diagnosis not present

## 2013-09-22 LAB — LIPID PANEL
Cholesterol: 240 mg/dL — AB (ref 0–200)
HDL: 106 mg/dL — AB (ref 35–70)
LDL Cholesterol: 161 mg/dL
Triglycerides: 58 mg/dL (ref 40–160)

## 2013-10-18 DIAGNOSIS — R52 Pain, unspecified: Secondary | ICD-10-CM | POA: Diagnosis not present

## 2013-10-18 DIAGNOSIS — E119 Type 2 diabetes mellitus without complications: Secondary | ICD-10-CM | POA: Diagnosis not present

## 2013-10-18 DIAGNOSIS — I10 Essential (primary) hypertension: Secondary | ICD-10-CM | POA: Diagnosis not present

## 2013-10-18 DIAGNOSIS — R109 Unspecified abdominal pain: Secondary | ICD-10-CM | POA: Diagnosis not present

## 2013-10-19 ENCOUNTER — Ambulatory Visit: Payer: Self-pay | Admitting: Family Medicine

## 2013-10-19 DIAGNOSIS — R351 Nocturia: Secondary | ICD-10-CM | POA: Diagnosis not present

## 2013-10-19 DIAGNOSIS — R109 Unspecified abdominal pain: Secondary | ICD-10-CM | POA: Diagnosis not present

## 2013-10-19 DIAGNOSIS — N281 Cyst of kidney, acquired: Secondary | ICD-10-CM | POA: Diagnosis not present

## 2013-10-19 DIAGNOSIS — N400 Enlarged prostate without lower urinary tract symptoms: Secondary | ICD-10-CM | POA: Diagnosis not present

## 2013-10-19 DIAGNOSIS — E119 Type 2 diabetes mellitus without complications: Secondary | ICD-10-CM | POA: Diagnosis not present

## 2013-10-19 DIAGNOSIS — Q619 Cystic kidney disease, unspecified: Secondary | ICD-10-CM | POA: Diagnosis not present

## 2013-11-01 ENCOUNTER — Ambulatory Visit: Payer: Self-pay | Admitting: Gastroenterology

## 2013-11-01 DIAGNOSIS — A048 Other specified bacterial intestinal infections: Secondary | ICD-10-CM | POA: Diagnosis not present

## 2013-11-01 DIAGNOSIS — R748 Abnormal levels of other serum enzymes: Secondary | ICD-10-CM | POA: Diagnosis not present

## 2013-11-01 LAB — HEPATIC FUNCTION PANEL A (ARMC)
Albumin: 3.9 g/dL (ref 3.4–5.0)
Alkaline Phosphatase: 91 U/L
Bilirubin, Direct: 0.1 mg/dL (ref 0.00–0.20)
Bilirubin,Total: 0.4 mg/dL (ref 0.2–1.0)
SGOT(AST): 22 U/L (ref 15–37)
SGPT (ALT): 24 U/L (ref 12–78)
Total Protein: 7.9 g/dL (ref 6.4–8.2)

## 2013-11-01 LAB — CREATININE, SERUM
Creatinine: 0.93 mg/dL (ref 0.60–1.30)
EGFR (African American): >60
EGFR (Non-African Amer.): >60

## 2013-11-01 LAB — LIPASE, BLOOD: Lipase: 234 U/L (ref 73–393)

## 2013-11-01 LAB — AMYLASE: Amylase: 119 U/L — ABNORMAL HIGH (ref 25–115)

## 2013-11-07 ENCOUNTER — Ambulatory Visit: Payer: Self-pay | Admitting: Gastroenterology

## 2013-11-07 DIAGNOSIS — I7090 Unspecified atherosclerosis: Secondary | ICD-10-CM | POA: Diagnosis not present

## 2013-11-07 DIAGNOSIS — N200 Calculus of kidney: Secondary | ICD-10-CM | POA: Diagnosis not present

## 2013-11-07 DIAGNOSIS — R748 Abnormal levels of other serum enzymes: Secondary | ICD-10-CM | POA: Diagnosis not present

## 2013-11-07 DIAGNOSIS — N281 Cyst of kidney, acquired: Secondary | ICD-10-CM | POA: Diagnosis not present

## 2013-11-07 DIAGNOSIS — N400 Enlarged prostate without lower urinary tract symptoms: Secondary | ICD-10-CM | POA: Diagnosis not present

## 2013-11-09 ENCOUNTER — Ambulatory Visit: Payer: Self-pay | Admitting: Gastroenterology

## 2013-11-09 DIAGNOSIS — K5732 Diverticulitis of large intestine without perforation or abscess without bleeding: Secondary | ICD-10-CM | POA: Diagnosis not present

## 2013-11-09 LAB — CREATININE, SERUM
Creatinine: 0.99 mg/dL (ref 0.60–1.30)
EGFR (African American): >60
EGFR (Non-African Amer.): >60

## 2013-12-13 ENCOUNTER — Ambulatory Visit (INDEPENDENT_AMBULATORY_CARE_PROVIDER_SITE_OTHER): Payer: Medicare Other | Admitting: Podiatry

## 2013-12-13 DIAGNOSIS — M79609 Pain in unspecified limb: Secondary | ICD-10-CM

## 2013-12-13 DIAGNOSIS — B351 Tinea unguium: Secondary | ICD-10-CM

## 2013-12-13 NOTE — Progress Notes (Signed)
He presents today with a chief complaint of painful toenails bilateral. He denies fever chills nausea vomiting muscle aches and pains.  Objective: Vital signs are stable he is alert and oriented x3. Pulses are palpable bilateral. His nails are thick yellow dystrophic clinically mycotic and painful palpation.  Assessment: Pain in limb secondary to onychomycosis 1 through 5 bilateral.  Plan: Debridement of nails 1 through 5 bilateral is cover service secondary to pain.

## 2013-12-18 DIAGNOSIS — I10 Essential (primary) hypertension: Secondary | ICD-10-CM | POA: Diagnosis not present

## 2013-12-18 DIAGNOSIS — R51 Headache: Secondary | ICD-10-CM | POA: Diagnosis not present

## 2013-12-18 DIAGNOSIS — M531 Cervicobrachial syndrome: Secondary | ICD-10-CM | POA: Diagnosis not present

## 2013-12-18 DIAGNOSIS — IMO0001 Reserved for inherently not codable concepts without codable children: Secondary | ICD-10-CM | POA: Diagnosis not present

## 2014-01-10 DIAGNOSIS — M9981 Other biomechanical lesions of cervical region: Secondary | ICD-10-CM | POA: Diagnosis not present

## 2014-01-10 DIAGNOSIS — M546 Pain in thoracic spine: Secondary | ICD-10-CM | POA: Diagnosis not present

## 2014-01-10 DIAGNOSIS — M545 Low back pain, unspecified: Secondary | ICD-10-CM | POA: Diagnosis not present

## 2014-01-10 DIAGNOSIS — M5020 Other cervical disc displacement, unspecified cervical region: Secondary | ICD-10-CM | POA: Diagnosis not present

## 2014-01-10 DIAGNOSIS — M542 Cervicalgia: Secondary | ICD-10-CM | POA: Diagnosis not present

## 2014-01-10 DIAGNOSIS — M999 Biomechanical lesion, unspecified: Secondary | ICD-10-CM | POA: Diagnosis not present

## 2014-01-15 DIAGNOSIS — M546 Pain in thoracic spine: Secondary | ICD-10-CM | POA: Diagnosis not present

## 2014-01-15 DIAGNOSIS — M545 Low back pain, unspecified: Secondary | ICD-10-CM | POA: Diagnosis not present

## 2014-01-15 DIAGNOSIS — M999 Biomechanical lesion, unspecified: Secondary | ICD-10-CM | POA: Diagnosis not present

## 2014-01-15 DIAGNOSIS — M5020 Other cervical disc displacement, unspecified cervical region: Secondary | ICD-10-CM | POA: Diagnosis not present

## 2014-01-15 DIAGNOSIS — M542 Cervicalgia: Secondary | ICD-10-CM | POA: Diagnosis not present

## 2014-01-15 DIAGNOSIS — M9981 Other biomechanical lesions of cervical region: Secondary | ICD-10-CM | POA: Diagnosis not present

## 2014-01-16 DIAGNOSIS — H269 Unspecified cataract: Secondary | ICD-10-CM | POA: Insufficient documentation

## 2014-01-16 DIAGNOSIS — H409 Unspecified glaucoma: Secondary | ICD-10-CM | POA: Insufficient documentation

## 2014-01-17 DIAGNOSIS — M546 Pain in thoracic spine: Secondary | ICD-10-CM | POA: Diagnosis not present

## 2014-01-17 DIAGNOSIS — M5020 Other cervical disc displacement, unspecified cervical region: Secondary | ICD-10-CM | POA: Diagnosis not present

## 2014-01-17 DIAGNOSIS — M545 Low back pain, unspecified: Secondary | ICD-10-CM | POA: Diagnosis not present

## 2014-01-17 DIAGNOSIS — M542 Cervicalgia: Secondary | ICD-10-CM | POA: Diagnosis not present

## 2014-01-17 DIAGNOSIS — M999 Biomechanical lesion, unspecified: Secondary | ICD-10-CM | POA: Diagnosis not present

## 2014-01-17 DIAGNOSIS — M9981 Other biomechanical lesions of cervical region: Secondary | ICD-10-CM | POA: Diagnosis not present

## 2014-01-19 DIAGNOSIS — M545 Low back pain, unspecified: Secondary | ICD-10-CM | POA: Diagnosis not present

## 2014-01-19 DIAGNOSIS — M546 Pain in thoracic spine: Secondary | ICD-10-CM | POA: Diagnosis not present

## 2014-01-19 DIAGNOSIS — M999 Biomechanical lesion, unspecified: Secondary | ICD-10-CM | POA: Diagnosis not present

## 2014-01-19 DIAGNOSIS — M9981 Other biomechanical lesions of cervical region: Secondary | ICD-10-CM | POA: Diagnosis not present

## 2014-01-19 DIAGNOSIS — M542 Cervicalgia: Secondary | ICD-10-CM | POA: Diagnosis not present

## 2014-01-19 DIAGNOSIS — M5020 Other cervical disc displacement, unspecified cervical region: Secondary | ICD-10-CM | POA: Diagnosis not present

## 2014-01-22 ENCOUNTER — Encounter: Payer: Self-pay | Admitting: General Surgery

## 2014-01-22 DIAGNOSIS — E119 Type 2 diabetes mellitus without complications: Secondary | ICD-10-CM | POA: Diagnosis not present

## 2014-01-22 DIAGNOSIS — E785 Hyperlipidemia, unspecified: Secondary | ICD-10-CM | POA: Diagnosis not present

## 2014-01-22 DIAGNOSIS — I2510 Atherosclerotic heart disease of native coronary artery without angina pectoris: Secondary | ICD-10-CM | POA: Diagnosis not present

## 2014-01-22 DIAGNOSIS — I10 Essential (primary) hypertension: Secondary | ICD-10-CM | POA: Diagnosis not present

## 2014-01-22 DIAGNOSIS — Z23 Encounter for immunization: Secondary | ICD-10-CM | POA: Diagnosis not present

## 2014-01-23 DIAGNOSIS — M542 Cervicalgia: Secondary | ICD-10-CM | POA: Diagnosis not present

## 2014-01-23 DIAGNOSIS — M9981 Other biomechanical lesions of cervical region: Secondary | ICD-10-CM | POA: Diagnosis not present

## 2014-01-23 DIAGNOSIS — M546 Pain in thoracic spine: Secondary | ICD-10-CM | POA: Diagnosis not present

## 2014-01-23 DIAGNOSIS — M5020 Other cervical disc displacement, unspecified cervical region: Secondary | ICD-10-CM | POA: Diagnosis not present

## 2014-01-23 DIAGNOSIS — M545 Low back pain, unspecified: Secondary | ICD-10-CM | POA: Diagnosis not present

## 2014-01-23 DIAGNOSIS — M999 Biomechanical lesion, unspecified: Secondary | ICD-10-CM | POA: Diagnosis not present

## 2014-01-24 DIAGNOSIS — M542 Cervicalgia: Secondary | ICD-10-CM | POA: Diagnosis not present

## 2014-01-24 DIAGNOSIS — M9981 Other biomechanical lesions of cervical region: Secondary | ICD-10-CM | POA: Diagnosis not present

## 2014-01-24 DIAGNOSIS — M546 Pain in thoracic spine: Secondary | ICD-10-CM | POA: Diagnosis not present

## 2014-01-24 DIAGNOSIS — M5020 Other cervical disc displacement, unspecified cervical region: Secondary | ICD-10-CM | POA: Diagnosis not present

## 2014-01-24 DIAGNOSIS — M999 Biomechanical lesion, unspecified: Secondary | ICD-10-CM | POA: Diagnosis not present

## 2014-01-24 DIAGNOSIS — M545 Low back pain, unspecified: Secondary | ICD-10-CM | POA: Diagnosis not present

## 2014-01-26 DIAGNOSIS — M545 Low back pain, unspecified: Secondary | ICD-10-CM | POA: Diagnosis not present

## 2014-01-26 DIAGNOSIS — M546 Pain in thoracic spine: Secondary | ICD-10-CM | POA: Diagnosis not present

## 2014-01-26 DIAGNOSIS — M999 Biomechanical lesion, unspecified: Secondary | ICD-10-CM | POA: Diagnosis not present

## 2014-01-26 DIAGNOSIS — M542 Cervicalgia: Secondary | ICD-10-CM | POA: Diagnosis not present

## 2014-01-26 DIAGNOSIS — M9981 Other biomechanical lesions of cervical region: Secondary | ICD-10-CM | POA: Diagnosis not present

## 2014-01-26 DIAGNOSIS — M5020 Other cervical disc displacement, unspecified cervical region: Secondary | ICD-10-CM | POA: Diagnosis not present

## 2014-01-29 DIAGNOSIS — M545 Low back pain, unspecified: Secondary | ICD-10-CM | POA: Diagnosis not present

## 2014-01-29 DIAGNOSIS — M546 Pain in thoracic spine: Secondary | ICD-10-CM | POA: Diagnosis not present

## 2014-01-29 DIAGNOSIS — M542 Cervicalgia: Secondary | ICD-10-CM | POA: Diagnosis not present

## 2014-01-29 DIAGNOSIS — M5020 Other cervical disc displacement, unspecified cervical region: Secondary | ICD-10-CM | POA: Diagnosis not present

## 2014-01-29 DIAGNOSIS — M9981 Other biomechanical lesions of cervical region: Secondary | ICD-10-CM | POA: Diagnosis not present

## 2014-01-29 DIAGNOSIS — M999 Biomechanical lesion, unspecified: Secondary | ICD-10-CM | POA: Diagnosis not present

## 2014-01-31 DIAGNOSIS — M545 Low back pain, unspecified: Secondary | ICD-10-CM | POA: Diagnosis not present

## 2014-01-31 DIAGNOSIS — M542 Cervicalgia: Secondary | ICD-10-CM | POA: Diagnosis not present

## 2014-01-31 DIAGNOSIS — M546 Pain in thoracic spine: Secondary | ICD-10-CM | POA: Diagnosis not present

## 2014-01-31 DIAGNOSIS — M5020 Other cervical disc displacement, unspecified cervical region: Secondary | ICD-10-CM | POA: Diagnosis not present

## 2014-01-31 DIAGNOSIS — M999 Biomechanical lesion, unspecified: Secondary | ICD-10-CM | POA: Diagnosis not present

## 2014-01-31 DIAGNOSIS — M9981 Other biomechanical lesions of cervical region: Secondary | ICD-10-CM | POA: Diagnosis not present

## 2014-02-01 DIAGNOSIS — H4010X0 Unspecified open-angle glaucoma, stage unspecified: Secondary | ICD-10-CM | POA: Diagnosis not present

## 2014-02-05 ENCOUNTER — Encounter: Payer: Self-pay | Admitting: General Surgery

## 2014-02-05 ENCOUNTER — Ambulatory Visit (INDEPENDENT_AMBULATORY_CARE_PROVIDER_SITE_OTHER): Payer: Medicare Other | Admitting: General Surgery

## 2014-02-05 VITALS — BP 120/60 | HR 78 | Resp 14 | Ht 69.0 in | Wt 144.0 lb

## 2014-02-05 DIAGNOSIS — K4090 Unilateral inguinal hernia, without obstruction or gangrene, not specified as recurrent: Secondary | ICD-10-CM | POA: Diagnosis not present

## 2014-02-05 NOTE — Progress Notes (Signed)
Patient ID: Austin Hunt, male   DOB: Aug 06, 1941, 73 y.o.   MRN: [id number]  Chief Complaint  Patient presents with  . Other    left inguinal hernia    HPI Austin Hunt is a 73 y.o. male here for right inguinal hernia. He first noticed this about 3 years ago. He reports that the right hernia started to hurt about 2 months ago. He states that the last 2 days it has not been bothering him. The patient was seen here in April 2013 for a suspected right inguinal hernia. At that time there were significant prostate symptoms for which deferred repair was recommended. He reports that he is no longer having nocturia x3 and has no difficulty with his urinary stream. He reports his last PSA was less than 1.0. At that time had had intermittent mild discomfort in the right groin and had never had any left-sided symptoms after his 1967 repair. 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 No history of mumps. HPI  Past Medical History  Diagnosis Date  . Diabetes mellitus without complication   . Glaucoma     Past Surgical History  Procedure Laterality Date  . Appendectomy  1954  . Eye surgery  2011, 2013  . Hernia repair Left 1967    No family history on file.  Social History History  Substance Use Topics  . Smoking status: Never Smoker   . Smokeless tobacco: Never Used  . Alcohol Use: No    Allergies  Allergen Reactions  . Penicillins Itching    Current Outpatient Prescriptions  Medication Sig Dispense Refill  . aspirin 81 MG tablet Take 81 mg by mouth daily.      . brimonidine-timolol (COMBIGAN) 0.2-0.5 % ophthalmic solution Place 1 drop into both eyes every 12 (twelve) hours.      . Cholecalciferol (VITAMIN D PO) Take by mouth daily.      . Cyanocobalamin (VITAMIN B-12 PO) Take by mouth daily.      . dorzolamide (TRUSOPT) 2 % ophthalmic solution 1 drop 2 (two) times daily.      . Dutasteride-Tamsulosin HCl (JALYN) 0.5-0.4 MG CAPS Take by mouth daily.      Marland Kitchen  erythromycin with ethanol (EMGEL) 2 % gel Apply topically daily.      . metFORMIN (GLUCOPHAGE) 500 MG tablet Take 500 mg by mouth 3 (three) times daily. One in the morning and two at bedtime      . ramipril (ALTACE) 2.5 MG capsule Take 2.5 mg by mouth daily.      . travoprost, benzalkonium, (TRAVATAN) 0.004 % ophthalmic solution 1 drop at bedtime.      . gabapentin (NEURONTIN) 600 MG tablet Take 600 mg by mouth 2 (two) times daily.       No current facility-administered medications for this visit.    Review of Systems Review of Systems  Constitutional: Negative.   Respiratory: Negative.   Cardiovascular: Negative.   Gastrointestinal: Positive for constipation. Negative for nausea, vomiting, abdominal pain, diarrhea, blood in stool, abdominal distention, anal bleeding and rectal pain.    Blood pressure 120/60, pulse 78, resp. rate 14, height 5\' 9"  (1.753 m), weight 144 lb (65.318 kg).  Physical Exam Physical Exam  Constitutional: He is oriented to person, place, and time. He appears well-developed and well-nourished.  Neck: Neck supple.  Cardiovascular: Normal rate, regular rhythm and normal heart sounds.   Pulmonary/Chest: Effort normal and breath sounds normal.  Abdominal: A hernia is present.  Hernia confirmed positive in the right inguinal area.    Genitourinary:  Right testicle of normal size and consistency, nontender. Left side markedly diminished. No left groin hernias appreciated.  Lymphadenopathy:    He has no cervical adenopathy.  Neurological: He is alert and oriented to person, place, and time.    Data Reviewed 10/22/2011 CT showed prostate enlargement, a large left renal cyst and no bowel abnormalities.  Assessment    Symptomatic right inguinal hernia appeared    Plan    Elective repair is warranted. The procedure was reviewed. Plans for prosthetic mesh placement was discussed.     Patient's surgery has been scheduled for 02-22-14 at [facility]. It is okay for  patient to continue 81 mg aspirin.   PCP& REF Phy: Dr Salvatore Marvel    Robert Bellow 02/06/2014, 12:52 PM

## 2014-02-05 NOTE — Patient Instructions (Addendum)

## 2014-02-06 ENCOUNTER — Other Ambulatory Visit: Payer: Self-pay | Admitting: General Surgery

## 2014-02-06 DIAGNOSIS — K4090 Unilateral inguinal hernia, without obstruction or gangrene, not specified as recurrent: Secondary | ICD-10-CM

## 2014-02-08 DIAGNOSIS — E119 Type 2 diabetes mellitus without complications: Secondary | ICD-10-CM | POA: Diagnosis not present

## 2014-02-08 DIAGNOSIS — N419 Inflammatory disease of prostate, unspecified: Secondary | ICD-10-CM | POA: Diagnosis not present

## 2014-02-12 ENCOUNTER — Ambulatory Visit: Payer: Self-pay | Admitting: General Surgery

## 2014-02-12 DIAGNOSIS — Z01810 Encounter for preprocedural cardiovascular examination: Secondary | ICD-10-CM | POA: Diagnosis not present

## 2014-02-12 DIAGNOSIS — I10 Essential (primary) hypertension: Secondary | ICD-10-CM | POA: Diagnosis not present

## 2014-02-16 DIAGNOSIS — H4010X0 Unspecified open-angle glaucoma, stage unspecified: Secondary | ICD-10-CM | POA: Diagnosis not present

## 2014-02-19 ENCOUNTER — Emergency Department: Payer: Self-pay | Admitting: Emergency Medicine

## 2014-02-19 DIAGNOSIS — S0280XA Fracture of other specified skull and facial bones, unspecified side, initial encounter for closed fracture: Secondary | ICD-10-CM | POA: Diagnosis not present

## 2014-02-19 DIAGNOSIS — S1093XA Contusion of unspecified part of neck, initial encounter: Secondary | ICD-10-CM | POA: Diagnosis not present

## 2014-02-19 DIAGNOSIS — S020XXA Fracture of vault of skull, initial encounter for closed fracture: Secondary | ICD-10-CM | POA: Diagnosis not present

## 2014-02-19 DIAGNOSIS — E785 Hyperlipidemia, unspecified: Secondary | ICD-10-CM | POA: Diagnosis not present

## 2014-02-19 DIAGNOSIS — S0003XA Contusion of scalp, initial encounter: Secondary | ICD-10-CM | POA: Diagnosis not present

## 2014-02-19 DIAGNOSIS — N400 Enlarged prostate without lower urinary tract symptoms: Secondary | ICD-10-CM | POA: Diagnosis not present

## 2014-02-19 DIAGNOSIS — I10 Essential (primary) hypertension: Secondary | ICD-10-CM | POA: Diagnosis not present

## 2014-02-19 DIAGNOSIS — Z79899 Other long term (current) drug therapy: Secondary | ICD-10-CM | POA: Diagnosis not present

## 2014-02-19 DIAGNOSIS — E119 Type 2 diabetes mellitus without complications: Secondary | ICD-10-CM | POA: Diagnosis not present

## 2014-02-20 ENCOUNTER — Telehealth: Payer: Self-pay | Admitting: *Deleted

## 2014-02-20 NOTE — Telephone Encounter (Signed)
Message left for patient to call back to speak about his eye bone fracture.

## 2014-02-20 NOTE — Telephone Encounter (Signed)
Message copied by Lesly Rubenstein on Tue Feb 20, 2014  4:33 PM ------      Message from: Robert Bellow      Created: Tue Feb 20, 2014  4:14 PM       Please notify the patient that I spoke to the anesthesia staff, and that it will be best if we postpone surgery for 2-3 weeks until things are healing.  If the ENT doctors will need to do any surgery, we can coordinate with them to do the hernia at that same time.  Ask him to let us know what Dr. Tami Ribas thinks about the timing of surgery.      Thanks.  ------

## 2014-02-20 NOTE — Telephone Encounter (Signed)
Pt called and stated that he is having surgery on Thursday 02/22/14 with Dr.Byrnett, he may need to reschedule, but he wasn't sure yet, he fell and fracture his left eye bone. He just wanted to talk to you about this.

## 2014-02-20 NOTE — Telephone Encounter (Signed)
Spoke with patient about postponing his surgery for 2-3 weeks while he heals and he is amendable to this. He will be seeing Dr Beverly Gust on 02/23/14 and will ask him about this timeline for his surgery. He will call us once his appointment with Dr Tami Ribas is complete to let us know what his recommendations are.

## 2014-02-23 DIAGNOSIS — S022XXA Fracture of nasal bones, initial encounter for closed fracture: Secondary | ICD-10-CM | POA: Diagnosis not present

## 2014-02-23 DIAGNOSIS — S0003XA Contusion of scalp, initial encounter: Secondary | ICD-10-CM | POA: Diagnosis not present

## 2014-02-23 DIAGNOSIS — S1093XA Contusion of unspecified part of neck, initial encounter: Secondary | ICD-10-CM | POA: Diagnosis not present

## 2014-02-26 ENCOUNTER — Ambulatory Visit: Payer: Self-pay | Admitting: Unknown Physician Specialty

## 2014-02-26 DIAGNOSIS — S02109A Fracture of base of skull, unspecified side, initial encounter for closed fracture: Secondary | ICD-10-CM | POA: Diagnosis not present

## 2014-02-26 DIAGNOSIS — S0280XA Fracture of other specified skull and facial bones, unspecified side, initial encounter for closed fracture: Secondary | ICD-10-CM | POA: Diagnosis not present

## 2014-03-08 ENCOUNTER — Telehealth: Payer: Self-pay | Admitting: General Surgery

## 2014-03-08 NOTE — Telephone Encounter (Signed)
x

## 2014-03-09 ENCOUNTER — Telehealth: Payer: Self-pay

## 2014-03-09 NOTE — Telephone Encounter (Signed)
Left message for patient to return my call about his wanting to reschedule his postponed surgery. Patient will need a short pre op exam in office prior to his surgery.

## 2014-03-12 ENCOUNTER — Telehealth: Payer: Self-pay

## 2014-03-12 NOTE — Telephone Encounter (Signed)
Patient has been rescheduled for surgery at [facility] on 05/10/14. He will have a pre op appointment with Dr Bary Castilla in office on 05/01/14 at 9:00 am. He will pre admit by phone on 05/02/14. Patient is aware of dates, times, and all instructions.

## 2014-03-13 DIAGNOSIS — I209 Angina pectoris, unspecified: Secondary | ICD-10-CM | POA: Diagnosis not present

## 2014-03-13 DIAGNOSIS — I10 Essential (primary) hypertension: Secondary | ICD-10-CM | POA: Diagnosis not present

## 2014-03-13 DIAGNOSIS — I2510 Atherosclerotic heart disease of native coronary artery without angina pectoris: Secondary | ICD-10-CM | POA: Diagnosis not present

## 2014-03-13 DIAGNOSIS — E785 Hyperlipidemia, unspecified: Secondary | ICD-10-CM | POA: Diagnosis not present

## 2014-03-14 ENCOUNTER — Encounter: Payer: Self-pay | Admitting: Podiatry

## 2014-03-14 ENCOUNTER — Ambulatory Visit (INDEPENDENT_AMBULATORY_CARE_PROVIDER_SITE_OTHER): Payer: Medicare Other | Admitting: Podiatry

## 2014-03-14 VITALS — BP 116/86 | HR 88 | Resp 18

## 2014-03-14 DIAGNOSIS — B351 Tinea unguium: Secondary | ICD-10-CM

## 2014-03-14 DIAGNOSIS — M79609 Pain in unspecified limb: Secondary | ICD-10-CM

## 2014-03-14 NOTE — Progress Notes (Signed)
He presents today with a chief complaint of painful elongated toenails. ° °Objective: Nails are thick yellow dystrophic clinically mycotic and painful on palpation and debridement. ° °Assessment: Pain in limb secondary to onychomycosis 1 through 5 bilateral. ° °Plan: Debridement of nails in thickness and length as cover service. °

## 2014-03-22 DIAGNOSIS — K5900 Constipation, unspecified: Secondary | ICD-10-CM | POA: Diagnosis not present

## 2014-03-22 DIAGNOSIS — N419 Inflammatory disease of prostate, unspecified: Secondary | ICD-10-CM | POA: Diagnosis not present

## 2014-03-22 DIAGNOSIS — S022XXA Fracture of nasal bones, initial encounter for closed fracture: Secondary | ICD-10-CM | POA: Diagnosis not present

## 2014-03-22 DIAGNOSIS — F411 Generalized anxiety disorder: Secondary | ICD-10-CM | POA: Diagnosis not present

## 2014-03-28 IMAGING — CT CT HEAD WITHOUT CONTRAST
2 series · 16 of 30 positions shown, 20 images · non-contrast
Comparison: none

REASON FOR EXAM: syncope
COMMENTS:

[Series 2: without · axial · non-contrast · 0.41mm/px · z∈[-22,+98]mm · 13 of 30 slices shown, 17 images]
[im 3/30  brain]
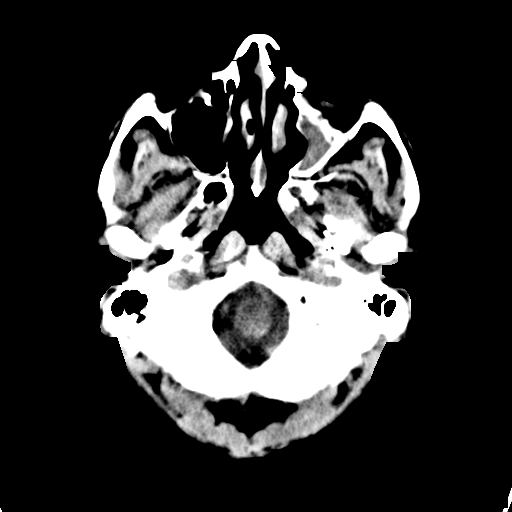
[im 3/30  bone]
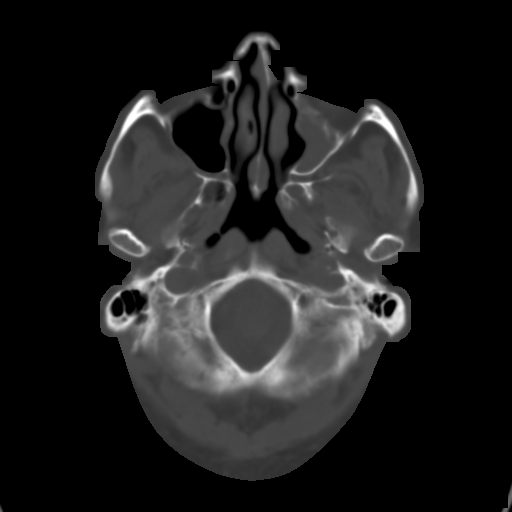
[im 5/30  brain]
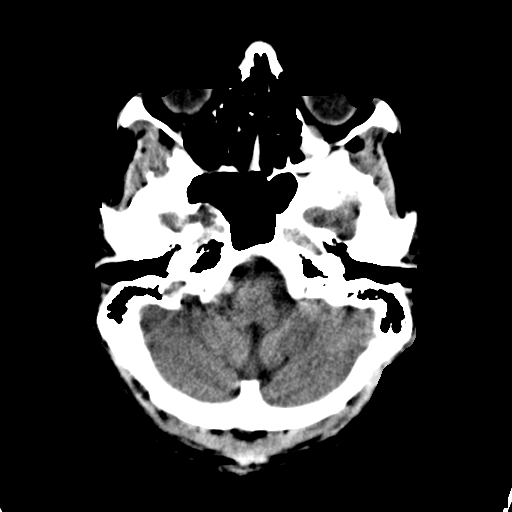
[im 7/30  brain]
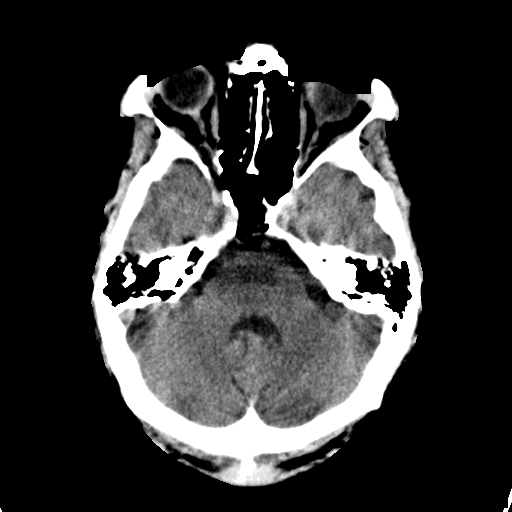
[im 9/30  brain]
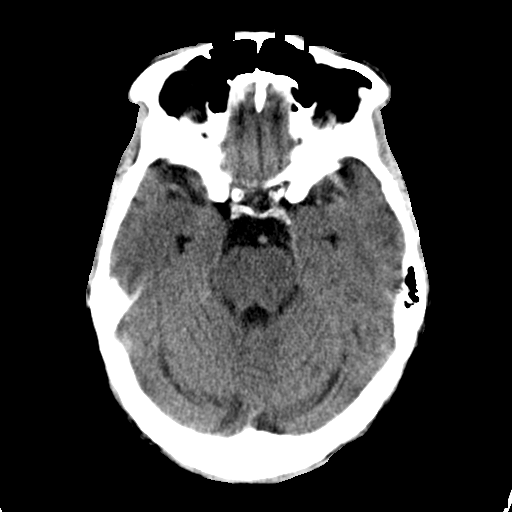
[im 11/30  brain]
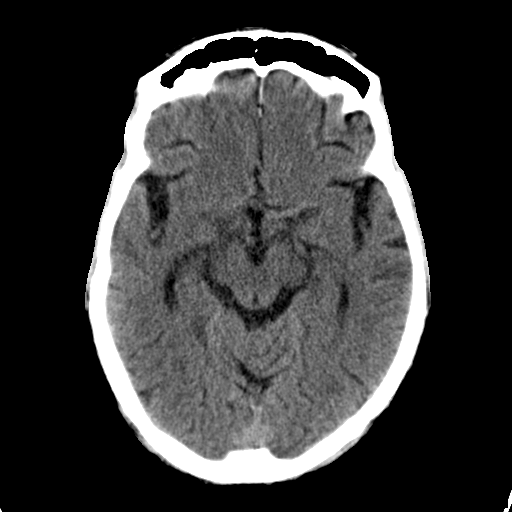
[im 11/30  bone]
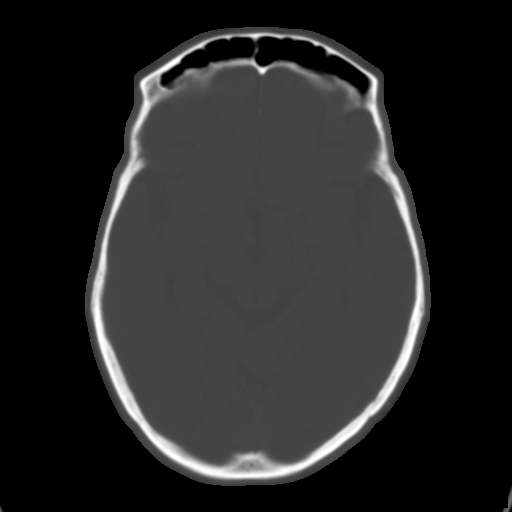
[im 13/30  brain]
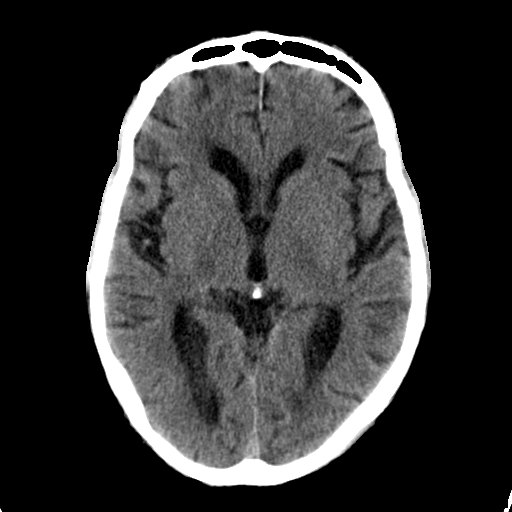
[im 15/30  brain]
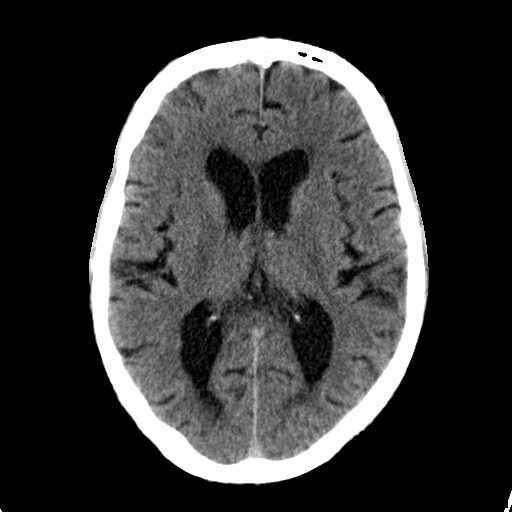
[im 17/30  brain]
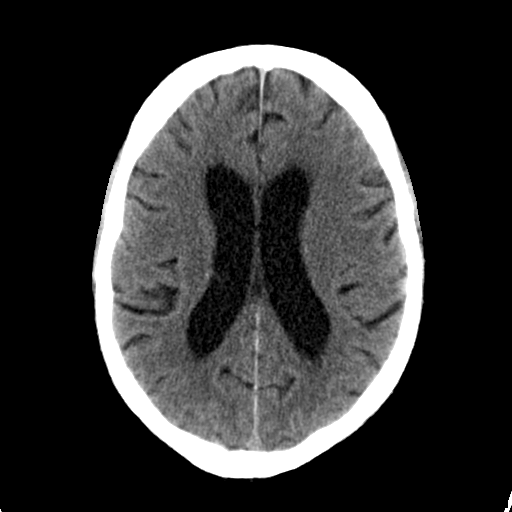
[im 19/30  brain]
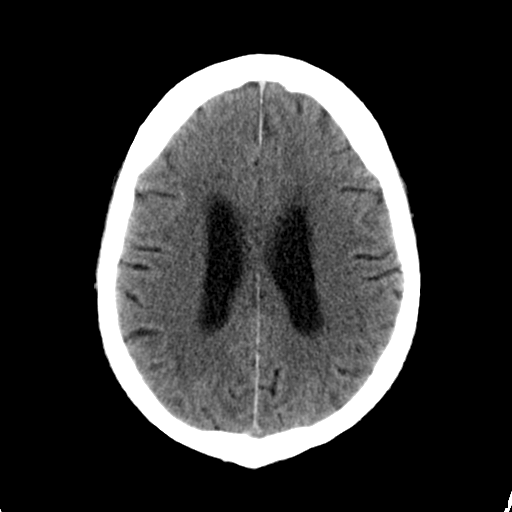
[im 19/30  bone]
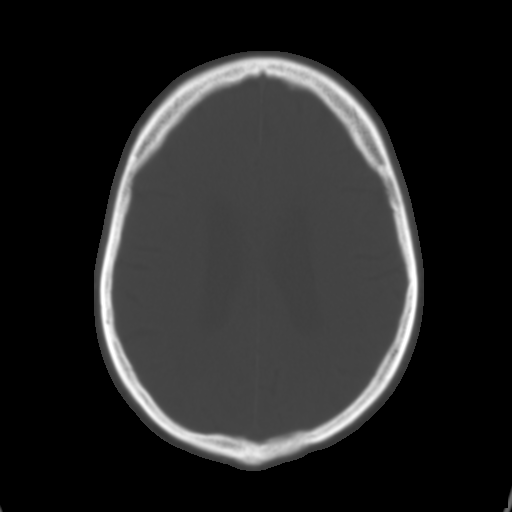
[im 21/30  brain]
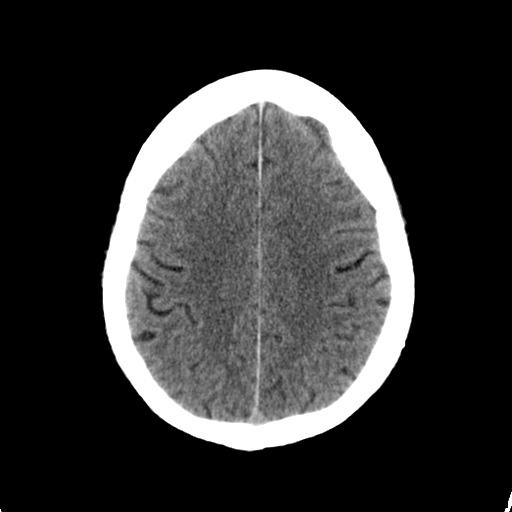
[im 23/30  brain]
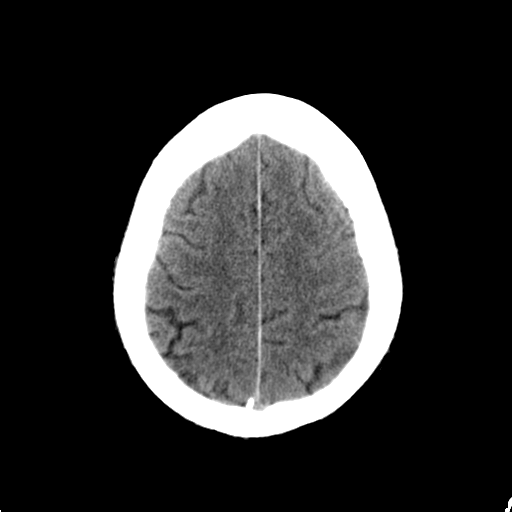
[im 25/30  brain]
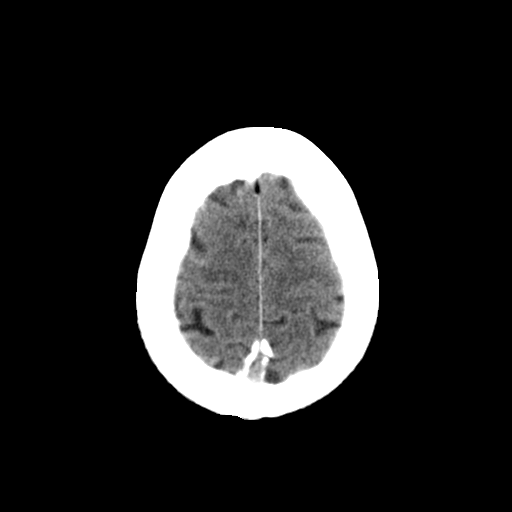
[im 27/30  brain]
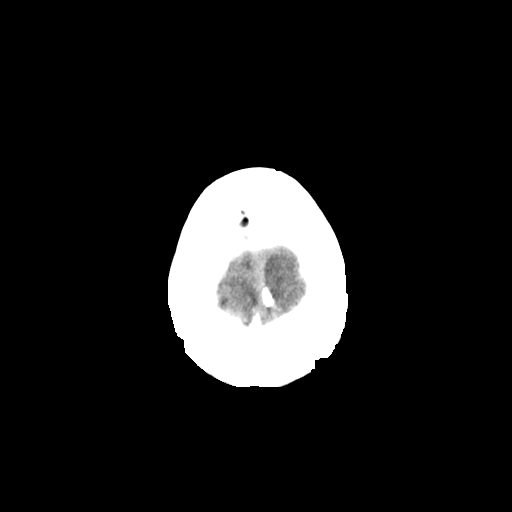
[im 27/30  bone]
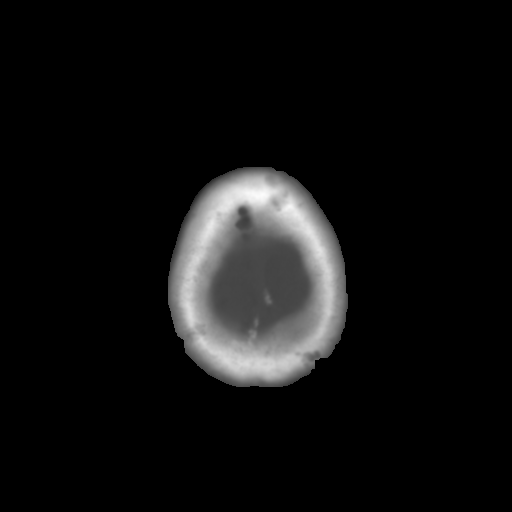

[Series 3: bone · axial · 0.41mm/px · z∈[-22,+18]mm · 3 of 30 slices shown]
[im 3/30  bone]
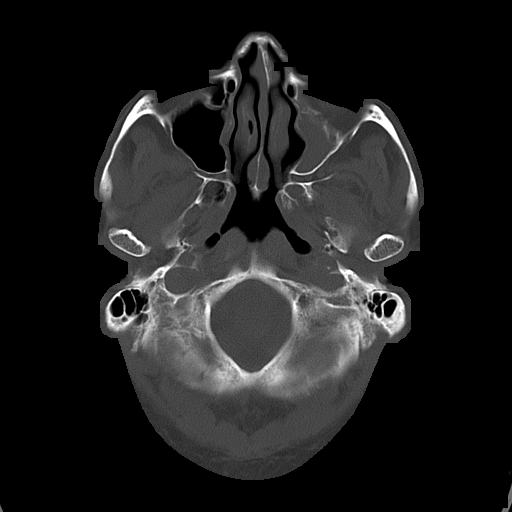
[im 7/30  bone]
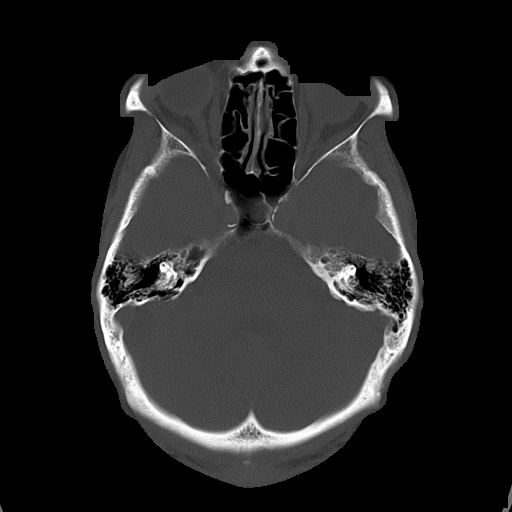
[im 11/30  bone]
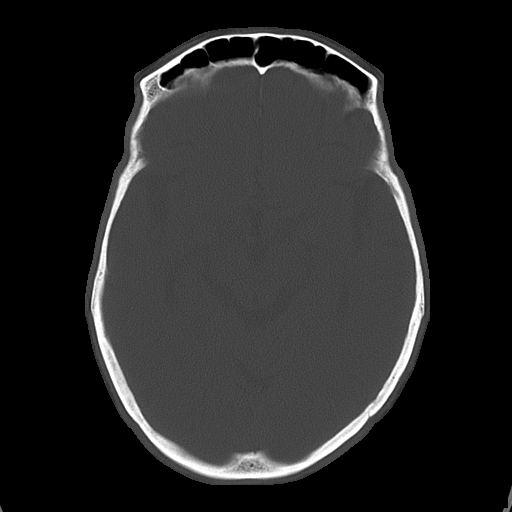

[16 of 30 positions shown; findings below may reference images not displayed]

PROCEDURE:     CT  - CT HEAD WITHOUT CONTRAST  - June 13, 2012  [DATE]

RESULT:     Axial noncontrast CT scanning was performed through the brain
with reconstructions at 5 mm intervals and slice thicknesses.

The ventricles are normal in size and position or age. There is no
intracranial hemorrhage nor intracranial mass effect. There are no abnormal
intracranial calcifications. There is no evidence of an evolving ischemic or
hemorrhagic infarction. The cerebellum and brainstem are normal in density.

At bone window settings there is opacification the visualized portions of
the left maxillary sinus. There is no evidence of an acute skull fracture.
IMPRESSION: 1. The appearance of the brain is normal for age.
2. There is opacification of the left maxillary sinus which may be acute or
chronic.

[REDACTED]

## 2014-03-29 IMAGING — US US CAROTID DUPLEX BILAT
1 series · 14 of 24 positions shown · non-contrast
Comparison: none

REASON FOR EXAM: Syncope
COMMENTS:

[Series 1: us carotid duplex bilat · 14 of 59 slices shown]
[im 1/59]
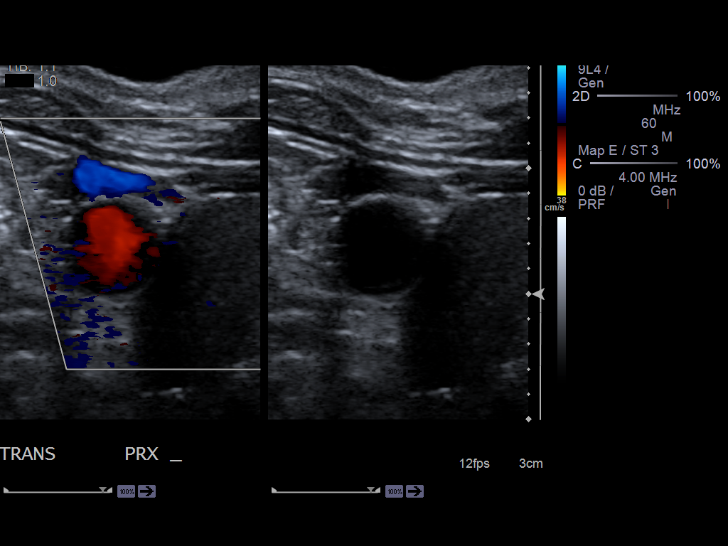
[im 6/59]
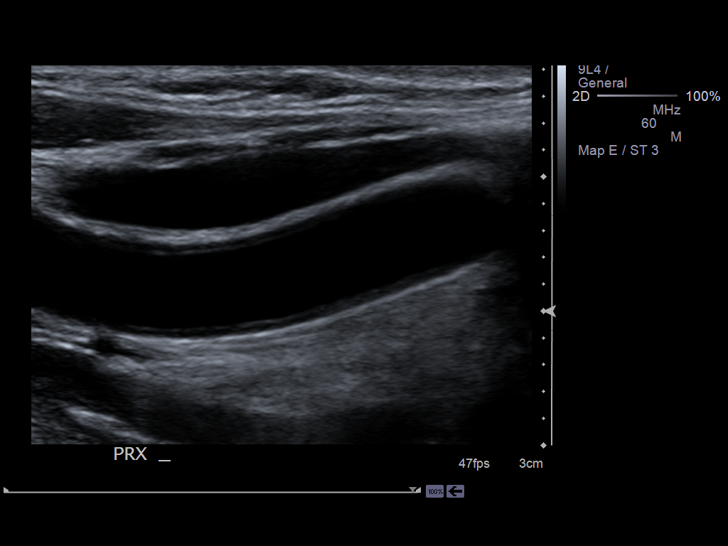
[im 11/59]
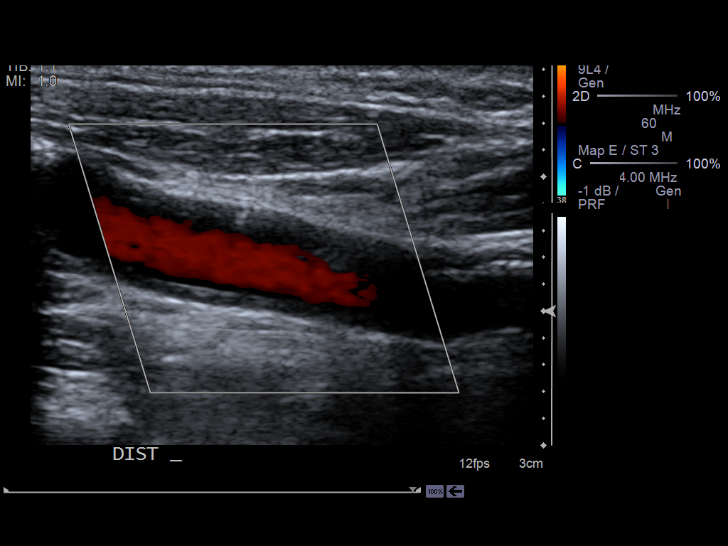
[im 16/59]
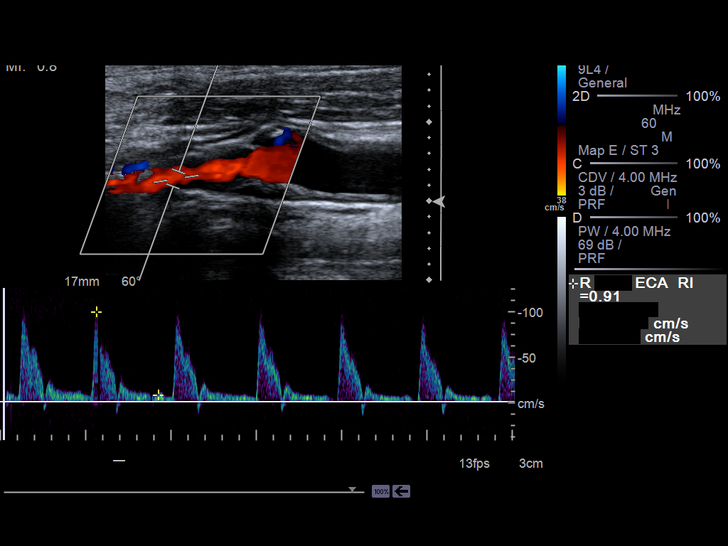
[im 18/59]
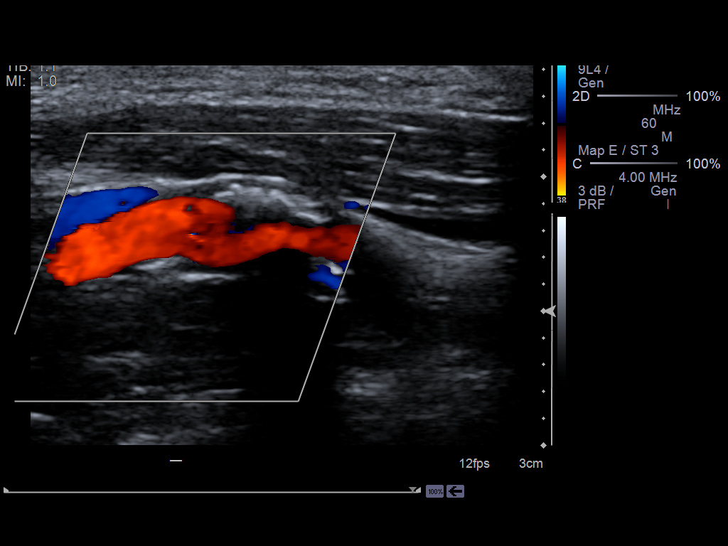
[im 23/59]
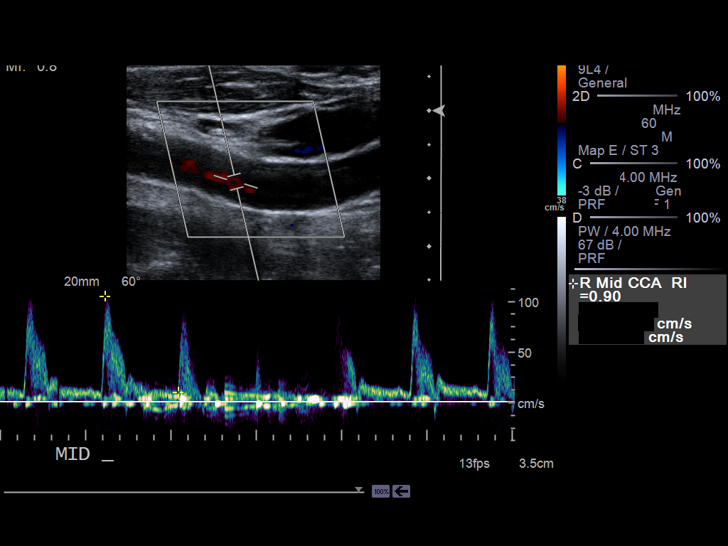
[im 28/59]
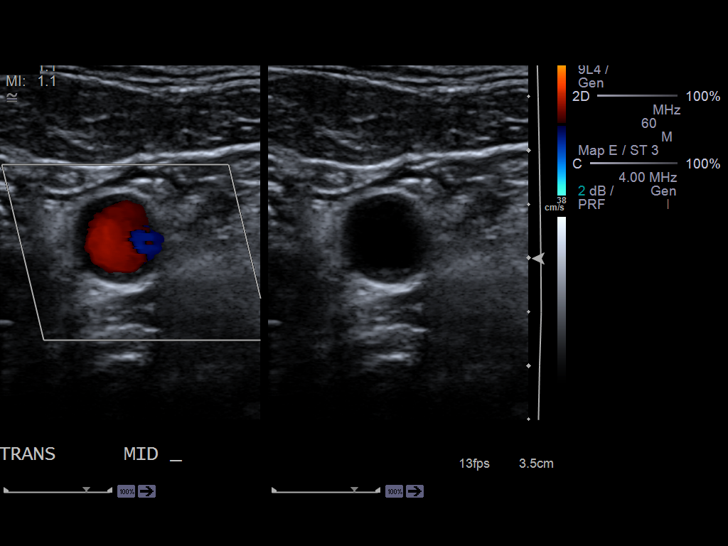
[im 31/59]
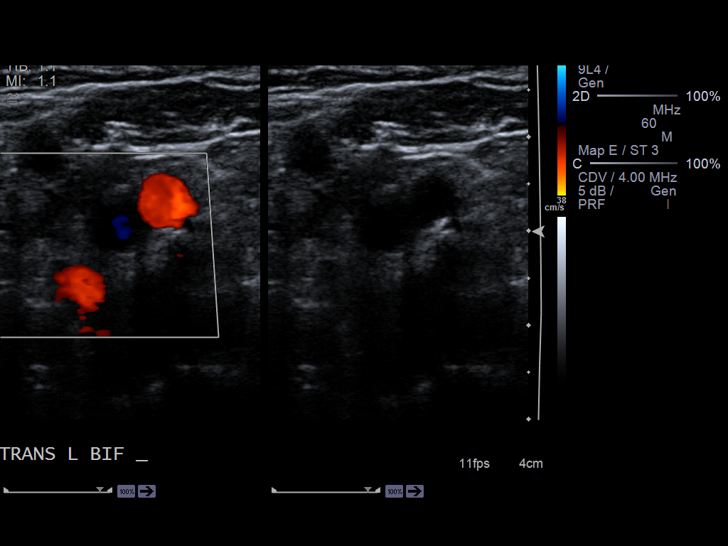
[im 36/59]
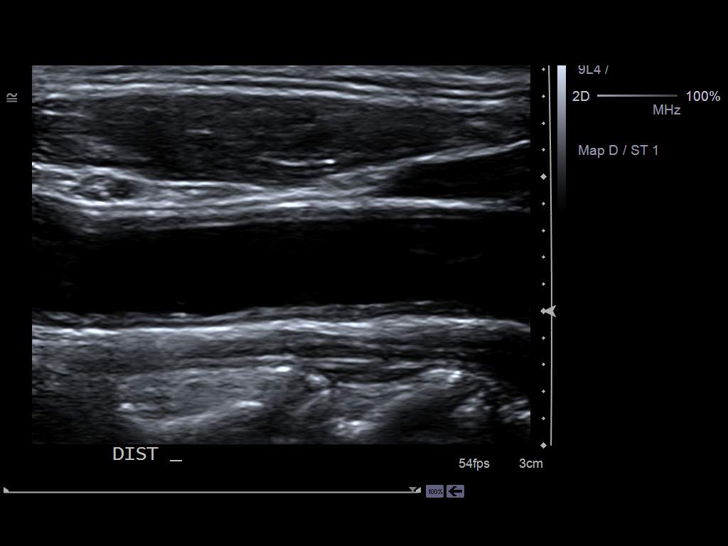
[im 41/59]
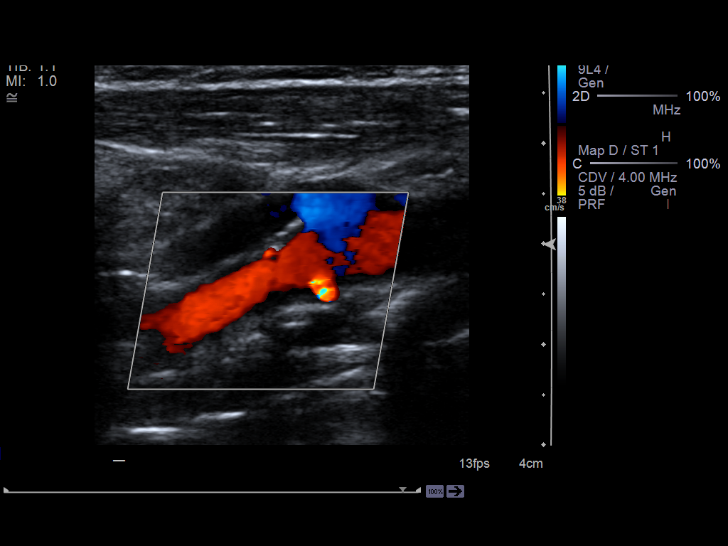
[im 46/59]
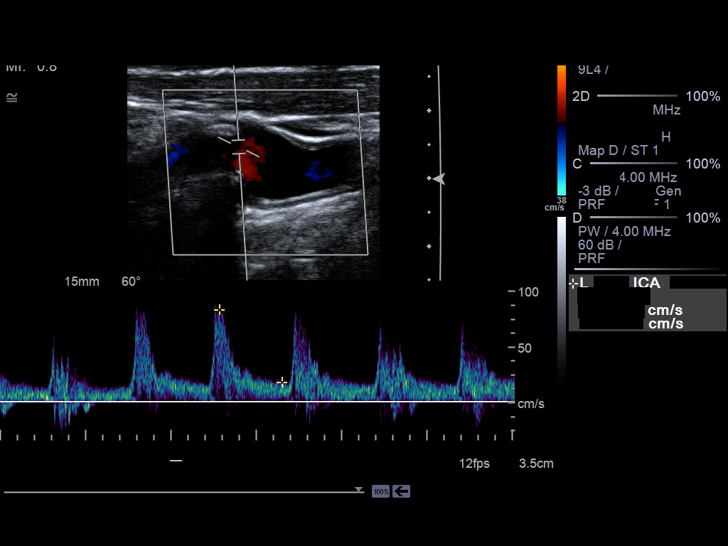
[im 48/59]
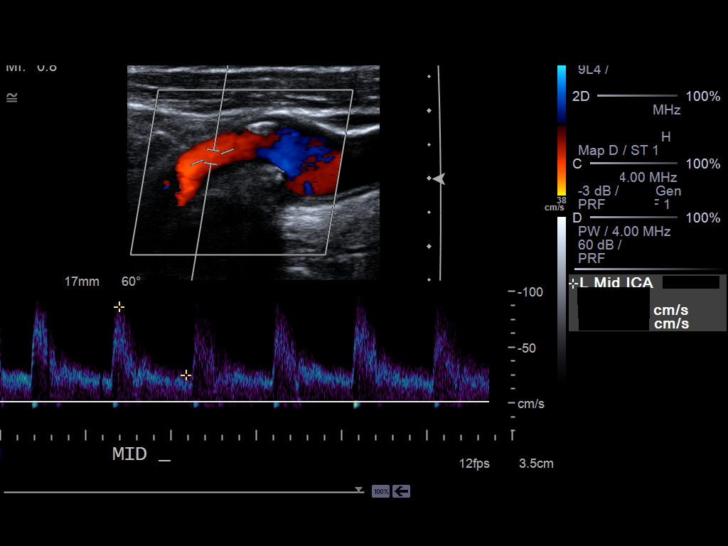
[im 53/59]
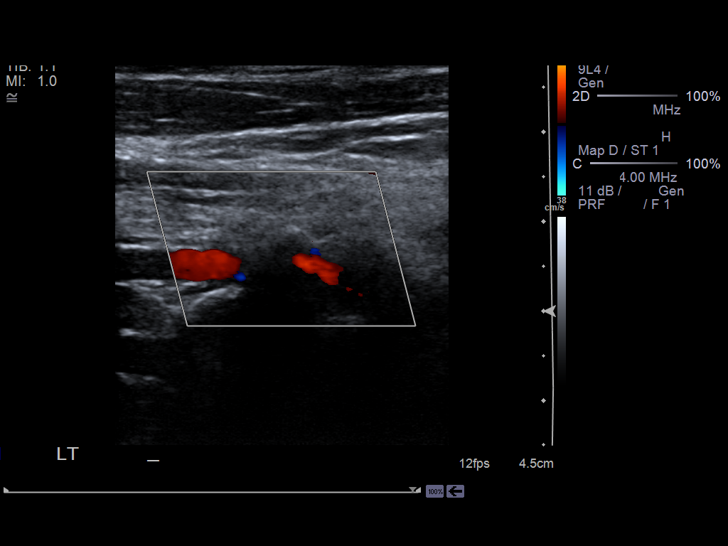
[im 59/59]
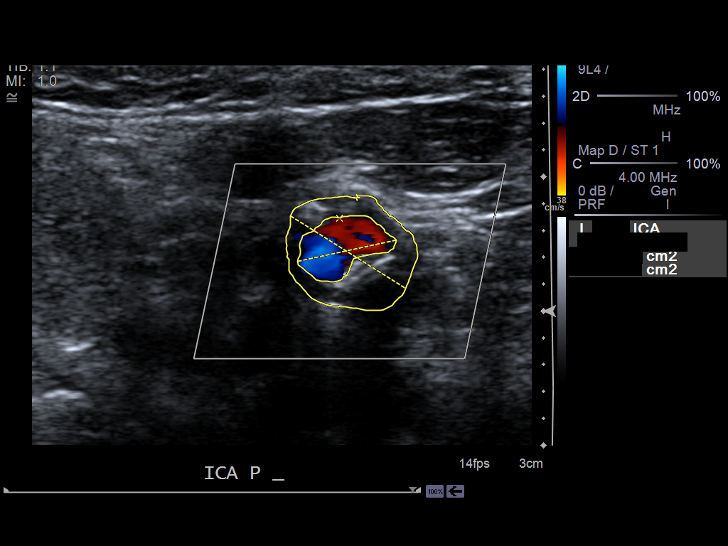

[14 of 24 positions shown; findings below may reference images not displayed]

PROCEDURE:     US  - US CAROTID DOPPLER BILATERAL  - June 14, 2012 [DATE]

RESULT:     Carotid Doppler interrogation demonstrates the presence of
atherosclerotic plaque bilaterally in the carotids without a high degree of
stenosis. There does not appear to be significant ulceration. Calcified and
noncalcified plaque is present. Antegrade flow is seen in both vertebral
arteries without flow reversal. There does not appear to be increased flow
velocity. Measurements of the lumen of the right and left internal carotids
show a percent stenosis of 57% in the right and 65% in the left proximal
internal carotids. The peak systolic velocities and peak systolic velocity
ratios are normal measuring 1.22 on the right and 1.25 on the left.
IMPRESSION: Atherosclerotic disease with stenosis. No evidence of elevation of the peak
systolic velocity or velocity ratio. Right proximal ICA stenosis is measured
sonographically at 57% and in the left proximal ICA at 65%.

[REDACTED]

## 2014-04-19 DIAGNOSIS — R351 Nocturia: Secondary | ICD-10-CM | POA: Diagnosis not present

## 2014-04-19 DIAGNOSIS — R300 Dysuria: Secondary | ICD-10-CM | POA: Diagnosis not present

## 2014-04-19 DIAGNOSIS — N476 Balanoposthitis: Secondary | ICD-10-CM | POA: Diagnosis not present

## 2014-04-19 DIAGNOSIS — N400 Enlarged prostate without lower urinary tract symptoms: Secondary | ICD-10-CM | POA: Diagnosis not present

## 2014-05-01 ENCOUNTER — Encounter: Payer: Self-pay | Admitting: General Surgery

## 2014-05-01 ENCOUNTER — Ambulatory Visit (INDEPENDENT_AMBULATORY_CARE_PROVIDER_SITE_OTHER): Payer: Medicare Other | Admitting: General Surgery

## 2014-05-01 VITALS — BP 122/74 | HR 64 | Resp 12 | Ht 69.0 in | Wt 141.0 lb

## 2014-05-01 DIAGNOSIS — K4090 Unilateral inguinal hernia, without obstruction or gangrene, not specified as recurrent: Secondary | ICD-10-CM | POA: Diagnosis not present

## 2014-05-01 NOTE — Patient Instructions (Addendum)
Patient scheduled for hernia repair on 05/10/14. It is okay for patient to continue 81 mg aspirin. The patient is aware to call back for any questions or concerns.

## 2014-05-01 NOTE — Progress Notes (Signed)
Patient ID: Austin Hunt, male   DOB: [birth year], 73 y.o.   MRN: [id number]  Chief Complaint  Patient presents with  . Follow-up    Pre Op open inguinal hernia repair     HPI Austin Hunt is a 73 y.o. male who presents for a Pre Op evaluation for an open inguinal hernia repair. The patient denies any new problems at this time.    HPI  Past Medical History  Diagnosis Date  . Diabetes mellitus without complication   . Glaucoma     Past Surgical History  Procedure Laterality Date  . Appendectomy  1954  . Eye surgery  2011, 2013  . Hernia repair Left 1967    History reviewed. No pertinent family history.  Social History History  Substance Use Topics  . Smoking status: Never Smoker   . Smokeless tobacco: Never Used  . Alcohol Use: No    Allergies  Allergen Reactions  . Penicillins Itching    Current Outpatient Prescriptions  Medication Sig Dispense Refill  . aspirin 81 MG tablet Take 81 mg by mouth daily.      . brimonidine-timolol (COMBIGAN) 0.2-0.5 % ophthalmic solution Place 1 drop into both eyes every 12 (twelve) hours.      . Cyanocobalamin (VITAMIN B-12 PO) Take by mouth daily.      . Dutasteride-Tamsulosin HCl (JALYN) 0.5-0.4 MG CAPS Take by mouth daily.      . metFORMIN (GLUCOPHAGE) 500 MG tablet Take 500 mg by mouth 3 (three) times daily. One in the morning and two at bedtime      . Multiple Vitamin (MULTIVITAMIN) tablet Take 1 tablet by mouth daily.      . ramipril (ALTACE) 2.5 MG capsule Take 2.5 mg by mouth daily.      . travoprost, benzalkonium, (TRAVATAN) 0.004 % ophthalmic solution 1 drop at bedtime.      . Zinc 50 MG CAPS Take 1 capsule by mouth daily.       No current facility-administered medications for this visit.    Review of Systems Review of Systems  Constitutional: Negative.   Respiratory: Negative.   Cardiovascular: Negative.   Gastrointestinal: Negative.   Genitourinary: Negative for difficulty urinating (The patient reports  nocturia 1-2 times per night, less with decreased oral intake in the evening.. No sense of incomplete emptying.).    Blood pressure 122/74, pulse 64, resp. rate 12, height 5\' 9"  (1.753 m), weight 141 lb (63.957 kg).  Physical Exam Physical Exam  Constitutional: He is oriented to person, place, and time. He appears well-developed and well-nourished.  Cardiovascular: Normal rate, regular rhythm and normal heart sounds.   No murmur heard. Pulmonary/Chest: Effort normal and breath sounds normal.  Abdominal: Soft. Normal appearance and bowel sounds are normal. There is no hepatosplenomegaly. There is no tenderness. A hernia is present. Hernia confirmed positive in the right inguinal area (reducible in the standing position.).  Genitourinary:  The left testicle was significantly smaller than the right, dating back to childhood by patient report.  Neurological: He is alert and oriented to person, place, and time.  Skin: Skin is warm and dry.     Assessment    Right inguinal hernia.    Plan    Indications for surgical repair were reviewed. The roll of prosthetic mesh for hernia repair was reviewed. Risks associated with the procedure including those of bleeding, infection pain were discussed.    Patient's surgery has been scheduled for 05-10-14 at [facility]. It is okay for  patient to continue 81 mg aspirin.   PCP: Mamie Laurel 05/02/2014, 7:20 AM

## 2014-05-02 ENCOUNTER — Other Ambulatory Visit: Payer: Self-pay | Admitting: General Surgery

## 2014-05-02 DIAGNOSIS — K4090 Unilateral inguinal hernia, without obstruction or gangrene, not specified as recurrent: Secondary | ICD-10-CM

## 2014-05-10 ENCOUNTER — Ambulatory Visit: Payer: Self-pay | Admitting: General Surgery

## 2014-05-10 DIAGNOSIS — H409 Unspecified glaucoma: Secondary | ICD-10-CM | POA: Diagnosis not present

## 2014-05-10 DIAGNOSIS — I2510 Atherosclerotic heart disease of native coronary artery without angina pectoris: Secondary | ICD-10-CM | POA: Diagnosis not present

## 2014-05-10 DIAGNOSIS — E119 Type 2 diabetes mellitus without complications: Secondary | ICD-10-CM | POA: Diagnosis not present

## 2014-05-10 DIAGNOSIS — K4090 Unilateral inguinal hernia, without obstruction or gangrene, not specified as recurrent: Secondary | ICD-10-CM | POA: Diagnosis not present

## 2014-05-10 DIAGNOSIS — M129 Arthropathy, unspecified: Secondary | ICD-10-CM | POA: Diagnosis not present

## 2014-05-10 DIAGNOSIS — I10 Essential (primary) hypertension: Secondary | ICD-10-CM | POA: Diagnosis not present

## 2014-05-10 DIAGNOSIS — Z7982 Long term (current) use of aspirin: Secondary | ICD-10-CM | POA: Diagnosis not present

## 2014-05-10 DIAGNOSIS — Z880 Allergy status to penicillin: Secondary | ICD-10-CM | POA: Diagnosis not present

## 2014-05-10 DIAGNOSIS — IMO0001 Reserved for inherently not codable concepts without codable children: Secondary | ICD-10-CM | POA: Diagnosis not present

## 2014-05-10 DIAGNOSIS — K4190 Unilateral femoral hernia, without obstruction or gangrene, not specified as recurrent: Secondary | ICD-10-CM | POA: Diagnosis not present

## 2014-05-10 DIAGNOSIS — Z79899 Other long term (current) drug therapy: Secondary | ICD-10-CM | POA: Diagnosis not present

## 2014-05-10 DIAGNOSIS — N400 Enlarged prostate without lower urinary tract symptoms: Secondary | ICD-10-CM | POA: Diagnosis not present

## 2014-05-10 HISTORY — PX: HERNIA REPAIR: SHX51

## 2014-05-11 ENCOUNTER — Encounter: Payer: Self-pay | Admitting: General Surgery

## 2014-05-14 ENCOUNTER — Telehealth: Payer: Self-pay | Admitting: General Surgery

## 2014-05-14 NOTE — Telephone Encounter (Signed)
PATIENT CALLED TODAY STATING HE  WAS CONSTIPATED. HE HAS HAD A GLASS OF MIRALAX  YESTERDAY AND TODAY ALONG WITH SOME PRUNES. HE HAD HERNIA SX LAST Wednesday. WHAT DOES HE NEED TO DO? HE USES [name].

## 2014-05-14 NOTE — Telephone Encounter (Signed)
Patient to make use of Milk of Magnesia, 30 cc at bedtime, repeat in AM if needed.

## 2014-05-15 ENCOUNTER — Telehealth: Payer: Self-pay | Admitting: *Deleted

## 2014-05-15 NOTE — Telephone Encounter (Signed)
Pt called and said he spoke with you yesterday about being constipated and you told him to take Milk of Magnesia and he took it last night and this morning and its not helping, he said it feels packed in there.

## 2014-05-15 NOTE — Telephone Encounter (Signed)
The patient was encouraged to make use of a fleets enema this afternoon and taken additional 1 ounce dose of milk of magnesia this evening.

## 2014-05-22 ENCOUNTER — Encounter: Payer: Self-pay | Admitting: General Surgery

## 2014-05-22 ENCOUNTER — Ambulatory Visit (INDEPENDENT_AMBULATORY_CARE_PROVIDER_SITE_OTHER): Payer: Self-pay | Admitting: General Surgery

## 2014-05-22 VITALS — BP 118/64 | HR 62 | Resp 12 | Ht 69.0 in | Wt 137.0 lb

## 2014-05-22 DIAGNOSIS — K4090 Unilateral inguinal hernia, without obstruction or gangrene, not specified as recurrent: Secondary | ICD-10-CM

## 2014-05-22 DIAGNOSIS — K4191 Unilateral femoral hernia, without obstruction or gangrene, recurrent: Secondary | ICD-10-CM

## 2014-05-22 NOTE — Progress Notes (Signed)
Patient ID: Austin Hunt, male   DOB: 1941/09/16, 73 y.o.   MRN: [id number]  Chief Complaint  Patient presents with  . Routine Post Op    right inguinal hernia    HPI Austin Hunt is a 73 y.o. male.  Here today for postoperative visit, he had right femoral hernia repair on 05-10-14. States he finally had a BM on Wednesday after taking MOM and miralax and fleets enema. Occasional pain, no nausea or vomiting.  On clinical exam prior to surgery he was felt to have a inguinal hernia. Intraoperatively he was found to have a femoral hernia. No evidence of indirect or direct inguinal hernia identified during groin exploration.  HPI  Past Medical History  Diagnosis Date  . Diabetes mellitus without complication   . Glaucoma     Past Surgical History  Procedure Laterality Date  . Appendectomy  1954  . Eye surgery  2011, 2013  . Hernia repair Left 1967  . Hernia repair Right 05-10-14    Right femoral hernia repair Dr Bary Castilla with PerFix plug.     No family history on file.  Social History History  Substance Use Topics  . Smoking status: Never Smoker   . Smokeless tobacco: Never Used  . Alcohol Use: No    Allergies  Allergen Reactions  . Penicillins Itching    Current Outpatient Prescriptions  Medication Sig Dispense Refill  . HYDROCODONE-ACETAMINOPHEN PO Take by mouth as needed.      Marland Kitchen aspirin 81 MG tablet Take 81 mg by mouth daily.      . brimonidine-timolol (COMBIGAN) 0.2-0.5 % ophthalmic solution Place 1 drop into both eyes every 12 (twelve) hours.      . Cyanocobalamin (VITAMIN B-12 PO) Take by mouth daily.      . Dutasteride-Tamsulosin HCl (JALYN) 0.5-0.4 MG CAPS Take by mouth daily.      . metFORMIN (GLUCOPHAGE) 500 MG tablet Take 500 mg by mouth 3 (three) times daily. One in the morning and two at bedtime      . Multiple Vitamin (MULTIVITAMIN) tablet Take 1 tablet by mouth daily.      . ramipril (ALTACE) 2.5 MG capsule Take 2.5 mg by mouth daily.      . travoprost,  benzalkonium, (TRAVATAN) 0.004 % ophthalmic solution 1 drop at bedtime.      . Zinc 50 MG CAPS Take 1 capsule by mouth daily.       No current facility-administered medications for this visit.    Review of Systems Review of Systems  Constitutional: Negative.   Respiratory: Negative.   Cardiovascular: Negative.   Gastrointestinal: Positive for abdominal pain. Negative for diarrhea and constipation.    Blood pressure 118/64, pulse 62, resp. rate 12, height 5\' 9"  (1.753 m), weight 137 lb (62.143 kg).  Physical Exam Physical Exam  Constitutional: He is oriented to person, place, and time. He appears well-developed and well-nourished.  Genitourinary:  Incision healing well left groin  Neurological: He is alert and oriented to person, place, and time.  Skin: Skin is warm and dry.       Assessment    Doing well status post right femoral hernia repair    Plan    The patient was encouraged to increase his activity as tolerated. Upper lifting technique was demonstrated. Followup will be on an as-needed basis.     PCP: Mamie Laurel 05/23/2014, 12:37 PM

## 2014-05-22 NOTE — Patient Instructions (Addendum)
The patient is aware to call back for any questions or concerns. steri strips to the incision will come off gradually

## 2014-05-23 ENCOUNTER — Encounter: Payer: Self-pay | Admitting: General Surgery

## 2014-05-23 DIAGNOSIS — K4190 Unilateral femoral hernia, without obstruction or gangrene, not specified as recurrent: Secondary | ICD-10-CM | POA: Insufficient documentation

## 2014-05-23 DIAGNOSIS — K4191 Unilateral femoral hernia, without obstruction or gangrene, recurrent: Secondary | ICD-10-CM | POA: Insufficient documentation

## 2014-05-28 DIAGNOSIS — I10 Essential (primary) hypertension: Secondary | ICD-10-CM | POA: Diagnosis not present

## 2014-05-28 DIAGNOSIS — IMO0001 Reserved for inherently not codable concepts without codable children: Secondary | ICD-10-CM | POA: Diagnosis not present

## 2014-05-28 DIAGNOSIS — E7800 Pure hypercholesterolemia, unspecified: Secondary | ICD-10-CM | POA: Diagnosis not present

## 2014-05-28 DIAGNOSIS — Z9889 Other specified postprocedural states: Secondary | ICD-10-CM | POA: Diagnosis not present

## 2014-06-06 ENCOUNTER — Telehealth: Payer: Self-pay | Admitting: *Deleted

## 2014-06-06 NOTE — Telephone Encounter (Signed)
Pt had hernia surgery on the 9th of July and was told not to lift anything over 10 pounds. He was wondering can he go above that now? He has some stuff to do around the house that would require lifting more than 10 pounds.

## 2014-06-08 NOTE — Telephone Encounter (Signed)
The patient may increase his weight limit has is comfortable, care was lifting his to be exercised.

## 2014-06-13 ENCOUNTER — Ambulatory Visit: Payer: Medicare Other | Admitting: Podiatry

## 2014-06-14 DIAGNOSIS — M531 Cervicobrachial syndrome: Secondary | ICD-10-CM | POA: Diagnosis not present

## 2014-06-14 DIAGNOSIS — I158 Other secondary hypertension: Secondary | ICD-10-CM | POA: Diagnosis not present

## 2014-06-14 DIAGNOSIS — R51 Headache: Secondary | ICD-10-CM | POA: Diagnosis not present

## 2014-06-14 DIAGNOSIS — E118 Type 2 diabetes mellitus with unspecified complications: Secondary | ICD-10-CM | POA: Diagnosis not present

## 2014-06-22 DIAGNOSIS — H4010X0 Unspecified open-angle glaucoma, stage unspecified: Secondary | ICD-10-CM | POA: Diagnosis not present

## 2014-07-05 ENCOUNTER — Telehealth: Payer: Self-pay

## 2014-07-05 NOTE — Telephone Encounter (Signed)
Instructed to gradually increase physical activities, including sexually activity gradually, pt agrees.

## 2014-07-05 NOTE — Telephone Encounter (Signed)
Patient called with questions about allowed activities after his surgery. He would like to speak with someone about this.

## 2014-07-11 DIAGNOSIS — N401 Enlarged prostate with lower urinary tract symptoms: Secondary | ICD-10-CM | POA: Diagnosis not present

## 2014-07-11 DIAGNOSIS — N138 Other obstructive and reflux uropathy: Secondary | ICD-10-CM | POA: Diagnosis not present

## 2014-07-27 DIAGNOSIS — M5412 Radiculopathy, cervical region: Secondary | ICD-10-CM | POA: Diagnosis not present

## 2014-07-27 DIAGNOSIS — M5030 Other cervical disc degeneration, unspecified cervical region: Secondary | ICD-10-CM | POA: Diagnosis not present

## 2014-07-27 DIAGNOSIS — R51 Headache: Secondary | ICD-10-CM | POA: Diagnosis not present

## 2014-07-27 DIAGNOSIS — M9981 Other biomechanical lesions of cervical region: Secondary | ICD-10-CM | POA: Diagnosis not present

## 2014-07-30 DIAGNOSIS — M9981 Other biomechanical lesions of cervical region: Secondary | ICD-10-CM | POA: Diagnosis not present

## 2014-07-30 DIAGNOSIS — R51 Headache: Secondary | ICD-10-CM | POA: Diagnosis not present

## 2014-07-30 DIAGNOSIS — M5412 Radiculopathy, cervical region: Secondary | ICD-10-CM | POA: Diagnosis not present

## 2014-07-30 DIAGNOSIS — M5030 Other cervical disc degeneration, unspecified cervical region: Secondary | ICD-10-CM | POA: Diagnosis not present

## 2014-07-31 DIAGNOSIS — M5412 Radiculopathy, cervical region: Secondary | ICD-10-CM | POA: Diagnosis not present

## 2014-07-31 DIAGNOSIS — M9981 Other biomechanical lesions of cervical region: Secondary | ICD-10-CM | POA: Diagnosis not present

## 2014-07-31 DIAGNOSIS — M5030 Other cervical disc degeneration, unspecified cervical region: Secondary | ICD-10-CM | POA: Diagnosis not present

## 2014-07-31 DIAGNOSIS — R51 Headache: Secondary | ICD-10-CM | POA: Diagnosis not present

## 2014-08-01 DIAGNOSIS — M5412 Radiculopathy, cervical region: Secondary | ICD-10-CM | POA: Diagnosis not present

## 2014-08-01 DIAGNOSIS — R51 Headache: Secondary | ICD-10-CM | POA: Diagnosis not present

## 2014-08-01 DIAGNOSIS — M9981 Other biomechanical lesions of cervical region: Secondary | ICD-10-CM | POA: Diagnosis not present

## 2014-08-01 DIAGNOSIS — M5030 Other cervical disc degeneration, unspecified cervical region: Secondary | ICD-10-CM | POA: Diagnosis not present

## 2014-08-02 HISTORY — PX: BLADDER SURGERY: SHX569

## 2014-08-02 HISTORY — PX: PROSTATE SURGERY: SHX751

## 2014-08-03 DIAGNOSIS — R51 Headache: Secondary | ICD-10-CM | POA: Diagnosis not present

## 2014-08-03 DIAGNOSIS — M5030 Other cervical disc degeneration, unspecified cervical region: Secondary | ICD-10-CM | POA: Diagnosis not present

## 2014-08-03 DIAGNOSIS — M5413 Radiculopathy, cervicothoracic region: Secondary | ICD-10-CM | POA: Diagnosis not present

## 2014-08-03 DIAGNOSIS — M9901 Segmental and somatic dysfunction of cervical region: Secondary | ICD-10-CM | POA: Diagnosis not present

## 2014-08-03 DIAGNOSIS — Z23 Encounter for immunization: Secondary | ICD-10-CM | POA: Diagnosis not present

## 2014-08-06 DIAGNOSIS — M5413 Radiculopathy, cervicothoracic region: Secondary | ICD-10-CM | POA: Diagnosis not present

## 2014-08-06 DIAGNOSIS — M9901 Segmental and somatic dysfunction of cervical region: Secondary | ICD-10-CM | POA: Diagnosis not present

## 2014-08-06 DIAGNOSIS — R51 Headache: Secondary | ICD-10-CM | POA: Diagnosis not present

## 2014-08-06 DIAGNOSIS — M5030 Other cervical disc degeneration, unspecified cervical region: Secondary | ICD-10-CM | POA: Diagnosis not present

## 2014-08-08 DIAGNOSIS — R51 Headache: Secondary | ICD-10-CM | POA: Diagnosis not present

## 2014-08-08 DIAGNOSIS — M5413 Radiculopathy, cervicothoracic region: Secondary | ICD-10-CM | POA: Diagnosis not present

## 2014-08-08 DIAGNOSIS — M9901 Segmental and somatic dysfunction of cervical region: Secondary | ICD-10-CM | POA: Diagnosis not present

## 2014-08-08 DIAGNOSIS — M5030 Other cervical disc degeneration, unspecified cervical region: Secondary | ICD-10-CM | POA: Diagnosis not present

## 2014-08-10 DIAGNOSIS — M9901 Segmental and somatic dysfunction of cervical region: Secondary | ICD-10-CM | POA: Diagnosis not present

## 2014-08-10 DIAGNOSIS — M5413 Radiculopathy, cervicothoracic region: Secondary | ICD-10-CM | POA: Diagnosis not present

## 2014-08-10 DIAGNOSIS — M5030 Other cervical disc degeneration, unspecified cervical region: Secondary | ICD-10-CM | POA: Diagnosis not present

## 2014-08-10 DIAGNOSIS — R51 Headache: Secondary | ICD-10-CM | POA: Diagnosis not present

## 2014-08-13 DIAGNOSIS — M5030 Other cervical disc degeneration, unspecified cervical region: Secondary | ICD-10-CM | POA: Diagnosis not present

## 2014-08-13 DIAGNOSIS — M9901 Segmental and somatic dysfunction of cervical region: Secondary | ICD-10-CM | POA: Diagnosis not present

## 2014-08-13 DIAGNOSIS — R51 Headache: Secondary | ICD-10-CM | POA: Diagnosis not present

## 2014-08-13 DIAGNOSIS — M5413 Radiculopathy, cervicothoracic region: Secondary | ICD-10-CM | POA: Diagnosis not present

## 2014-08-15 DIAGNOSIS — M5030 Other cervical disc degeneration, unspecified cervical region: Secondary | ICD-10-CM | POA: Diagnosis not present

## 2014-08-15 DIAGNOSIS — M9901 Segmental and somatic dysfunction of cervical region: Secondary | ICD-10-CM | POA: Diagnosis not present

## 2014-08-15 DIAGNOSIS — R51 Headache: Secondary | ICD-10-CM | POA: Diagnosis not present

## 2014-08-15 DIAGNOSIS — M5413 Radiculopathy, cervicothoracic region: Secondary | ICD-10-CM | POA: Diagnosis not present

## 2014-08-17 DIAGNOSIS — Z01818 Encounter for other preprocedural examination: Secondary | ICD-10-CM | POA: Diagnosis not present

## 2014-08-17 DIAGNOSIS — M9901 Segmental and somatic dysfunction of cervical region: Secondary | ICD-10-CM | POA: Diagnosis not present

## 2014-08-17 DIAGNOSIS — N210 Calculus in bladder: Secondary | ICD-10-CM | POA: Diagnosis not present

## 2014-08-17 DIAGNOSIS — R51 Headache: Secondary | ICD-10-CM | POA: Diagnosis not present

## 2014-08-17 DIAGNOSIS — N401 Enlarged prostate with lower urinary tract symptoms: Secondary | ICD-10-CM | POA: Diagnosis not present

## 2014-08-17 DIAGNOSIS — M5413 Radiculopathy, cervicothoracic region: Secondary | ICD-10-CM | POA: Diagnosis not present

## 2014-08-17 DIAGNOSIS — M5030 Other cervical disc degeneration, unspecified cervical region: Secondary | ICD-10-CM | POA: Diagnosis not present

## 2014-08-20 ENCOUNTER — Ambulatory Visit: Payer: Self-pay | Admitting: Urology

## 2014-08-20 DIAGNOSIS — M5030 Other cervical disc degeneration, unspecified cervical region: Secondary | ICD-10-CM | POA: Diagnosis not present

## 2014-08-20 DIAGNOSIS — Z955 Presence of coronary angioplasty implant and graft: Secondary | ICD-10-CM | POA: Diagnosis not present

## 2014-08-20 DIAGNOSIS — R51 Headache: Secondary | ICD-10-CM | POA: Diagnosis not present

## 2014-08-20 DIAGNOSIS — N210 Calculus in bladder: Secondary | ICD-10-CM | POA: Diagnosis not present

## 2014-08-20 DIAGNOSIS — E119 Type 2 diabetes mellitus without complications: Secondary | ICD-10-CM | POA: Diagnosis not present

## 2014-08-20 DIAGNOSIS — Z01812 Encounter for preprocedural laboratory examination: Secondary | ICD-10-CM | POA: Diagnosis not present

## 2014-08-20 DIAGNOSIS — M9901 Segmental and somatic dysfunction of cervical region: Secondary | ICD-10-CM | POA: Diagnosis not present

## 2014-08-20 DIAGNOSIS — M5413 Radiculopathy, cervicothoracic region: Secondary | ICD-10-CM | POA: Diagnosis not present

## 2014-08-20 DIAGNOSIS — N401 Enlarged prostate with lower urinary tract symptoms: Secondary | ICD-10-CM | POA: Diagnosis not present

## 2014-08-20 LAB — BASIC METABOLIC PANEL
Anion Gap: 4 — ABNORMAL LOW (ref 7–16)
BUN: 15 mg/dL (ref 7–18)
Calcium, Total: 9.1 mg/dL (ref 8.5–10.1)
Chloride: 103 mmol/L (ref 98–107)
Co2: 31 mmol/L (ref 21–32)
Creatinine: 0.85 mg/dL (ref 0.60–1.30)
EGFR (African American): >60
EGFR (Non-African Amer.): >60
Glucose: 144 mg/dL — ABNORMAL HIGH (ref 65–99)
Osmolality: 279 (ref 275–301)
Potassium: 3.9 mmol/L (ref 3.5–5.1)
Sodium: 138 mmol/L (ref 136–145)

## 2014-08-20 LAB — URINALYSIS, COMPLETE
Bacteria: NONE SEEN
Bilirubin,UR: NEGATIVE
Blood: NEGATIVE
Glucose,UR: >=500 mg/dL (ref 0–75)
Ketone: NEGATIVE
Leukocyte Esterase: NEGATIVE
Nitrite: NEGATIVE
Ph: 5 (ref 4.5–8.0)
Protein: NEGATIVE
RBC,UR: 1 /HPF (ref 0–5)
Specific Gravity: 1.013 (ref 1.003–1.030)
Squamous Epithelial: <1
WBC UR: <1 /HPF (ref 0–5)

## 2014-08-20 LAB — PROTIME-INR
INR: 1
Prothrombin Time: 12.9 secs (ref 11.5–14.7)

## 2014-08-20 LAB — HEMOGLOBIN: HGB: 14.2 g/dL (ref 13.0–18.0)

## 2014-08-20 LAB — APTT: Activated PTT: 30 secs (ref 23.6–35.9)

## 2014-08-22 DIAGNOSIS — M5413 Radiculopathy, cervicothoracic region: Secondary | ICD-10-CM | POA: Diagnosis not present

## 2014-08-22 DIAGNOSIS — M5030 Other cervical disc degeneration, unspecified cervical region: Secondary | ICD-10-CM | POA: Diagnosis not present

## 2014-08-22 DIAGNOSIS — M9901 Segmental and somatic dysfunction of cervical region: Secondary | ICD-10-CM | POA: Diagnosis not present

## 2014-08-22 DIAGNOSIS — R51 Headache: Secondary | ICD-10-CM | POA: Diagnosis not present

## 2014-08-22 LAB — URINE CULTURE

## 2014-08-27 ENCOUNTER — Ambulatory Visit: Payer: Self-pay | Admitting: Urology

## 2014-08-27 DIAGNOSIS — I10 Essential (primary) hypertension: Secondary | ICD-10-CM | POA: Diagnosis not present

## 2014-08-27 DIAGNOSIS — E119 Type 2 diabetes mellitus without complications: Secondary | ICD-10-CM | POA: Diagnosis not present

## 2014-08-27 DIAGNOSIS — C61 Malignant neoplasm of prostate: Secondary | ICD-10-CM | POA: Diagnosis not present

## 2014-08-27 DIAGNOSIS — Z7982 Long term (current) use of aspirin: Secondary | ICD-10-CM | POA: Diagnosis not present

## 2014-08-27 DIAGNOSIS — N323 Diverticulum of bladder: Secondary | ICD-10-CM | POA: Diagnosis not present

## 2014-08-27 DIAGNOSIS — N320 Bladder-neck obstruction: Secondary | ICD-10-CM | POA: Diagnosis not present

## 2014-08-27 DIAGNOSIS — I2510 Atherosclerotic heart disease of native coronary artery without angina pectoris: Secondary | ICD-10-CM | POA: Diagnosis not present

## 2014-08-27 DIAGNOSIS — Z79899 Other long term (current) drug therapy: Secondary | ICD-10-CM | POA: Diagnosis not present

## 2014-08-27 DIAGNOSIS — N401 Enlarged prostate with lower urinary tract symptoms: Secondary | ICD-10-CM | POA: Diagnosis not present

## 2014-08-27 DIAGNOSIS — D400 Neoplasm of uncertain behavior of prostate: Secondary | ICD-10-CM | POA: Diagnosis not present

## 2014-08-27 DIAGNOSIS — N210 Calculus in bladder: Secondary | ICD-10-CM | POA: Diagnosis not present

## 2014-08-28 DIAGNOSIS — N210 Calculus in bladder: Secondary | ICD-10-CM | POA: Diagnosis not present

## 2014-08-28 DIAGNOSIS — N401 Enlarged prostate with lower urinary tract symptoms: Secondary | ICD-10-CM | POA: Diagnosis not present

## 2014-08-28 DIAGNOSIS — I2510 Atherosclerotic heart disease of native coronary artery without angina pectoris: Secondary | ICD-10-CM | POA: Diagnosis not present

## 2014-08-28 DIAGNOSIS — N323 Diverticulum of bladder: Secondary | ICD-10-CM | POA: Diagnosis not present

## 2014-08-28 DIAGNOSIS — N320 Bladder-neck obstruction: Secondary | ICD-10-CM | POA: Diagnosis not present

## 2014-08-28 DIAGNOSIS — C61 Malignant neoplasm of prostate: Secondary | ICD-10-CM | POA: Diagnosis not present

## 2014-08-28 LAB — BASIC METABOLIC PANEL
Anion Gap: 10 (ref 7–16)
BUN: 9 mg/dL (ref 7–18)
Calcium, Total: 8.3 mg/dL — ABNORMAL LOW (ref 8.5–10.1)
Chloride: 109 mmol/L — ABNORMAL HIGH (ref 98–107)
Co2: 26 mmol/L (ref 21–32)
Creatinine: 0.87 mg/dL (ref 0.60–1.30)
EGFR (African American): >60
EGFR (Non-African Amer.): >60
Glucose: 118 mg/dL — ABNORMAL HIGH (ref 65–99)
Osmolality: 288 (ref 275–301)
Potassium: 3.9 mmol/L (ref 3.5–5.1)
Sodium: 145 mmol/L (ref 136–145)

## 2014-08-28 LAB — CBC WITH DIFFERENTIAL/PLATELET
Basophil #: 0 10*3/uL (ref 0.0–0.1)
Basophil %: 0.4 %
Eosinophil #: 0.1 10*3/uL (ref 0.0–0.7)
Eosinophil %: 2.4 %
HCT: 41.3 % (ref 40.0–52.0)
HGB: 13.3 g/dL (ref 13.0–18.0)
Lymphocyte #: 1.2 10*3/uL (ref 1.0–3.6)
Lymphocyte %: 21.7 %
MCH: 28.8 pg (ref 26.0–34.0)
MCHC: 32.2 g/dL (ref 32.0–36.0)
MCV: 90 fL (ref 80–100)
Monocyte #: 0.5 x10 3/mm (ref 0.2–1.0)
Monocyte %: 8.1 %
Neutrophil #: 3.8 10*3/uL (ref 1.4–6.5)
Neutrophil %: 67.4 %
Platelet: 181 10*3/uL (ref 150–440)
RBC: 4.61 10*6/uL (ref 4.40–5.90)
RDW: 13.8 % (ref 11.5–14.5)
WBC: 5.7 10*3/uL (ref 3.8–10.6)

## 2014-09-11 DIAGNOSIS — N400 Enlarged prostate without lower urinary tract symptoms: Secondary | ICD-10-CM | POA: Diagnosis not present

## 2014-09-11 DIAGNOSIS — N210 Calculus in bladder: Secondary | ICD-10-CM | POA: Diagnosis not present

## 2014-09-11 DIAGNOSIS — N401 Enlarged prostate with lower urinary tract symptoms: Secondary | ICD-10-CM | POA: Diagnosis not present

## 2014-09-13 DIAGNOSIS — I10 Essential (primary) hypertension: Secondary | ICD-10-CM | POA: Diagnosis not present

## 2014-09-13 DIAGNOSIS — I209 Angina pectoris, unspecified: Secondary | ICD-10-CM | POA: Diagnosis not present

## 2014-09-13 DIAGNOSIS — E785 Hyperlipidemia, unspecified: Secondary | ICD-10-CM | POA: Diagnosis not present

## 2014-09-13 DIAGNOSIS — E119 Type 2 diabetes mellitus without complications: Secondary | ICD-10-CM | POA: Diagnosis not present

## 2014-10-01 DIAGNOSIS — E780 Pure hypercholesterolemia: Secondary | ICD-10-CM | POA: Diagnosis not present

## 2014-10-01 DIAGNOSIS — E1165 Type 2 diabetes mellitus with hyperglycemia: Secondary | ICD-10-CM | POA: Diagnosis not present

## 2014-10-01 DIAGNOSIS — E1129 Type 2 diabetes mellitus with other diabetic kidney complication: Secondary | ICD-10-CM | POA: Diagnosis not present

## 2014-10-01 DIAGNOSIS — I10 Essential (primary) hypertension: Secondary | ICD-10-CM | POA: Diagnosis not present

## 2014-10-01 DIAGNOSIS — R110 Nausea: Secondary | ICD-10-CM | POA: Diagnosis not present

## 2014-10-01 DIAGNOSIS — R42 Dizziness and giddiness: Secondary | ICD-10-CM | POA: Diagnosis not present

## 2014-10-01 LAB — HEMOGLOBIN A1C: Hgb A1c MFr Bld: 7.1 % — AB (ref 4.0–6.0)

## 2014-10-23 DIAGNOSIS — C61 Malignant neoplasm of prostate: Secondary | ICD-10-CM | POA: Diagnosis not present

## 2014-10-23 DIAGNOSIS — N401 Enlarged prostate with lower urinary tract symptoms: Secondary | ICD-10-CM | POA: Diagnosis not present

## 2014-10-29 ENCOUNTER — Ambulatory Visit (INDEPENDENT_AMBULATORY_CARE_PROVIDER_SITE_OTHER): Payer: Medicare Other | Admitting: Podiatry

## 2014-10-29 DIAGNOSIS — M79676 Pain in unspecified toe(s): Secondary | ICD-10-CM | POA: Diagnosis not present

## 2014-10-29 DIAGNOSIS — B351 Tinea unguium: Secondary | ICD-10-CM | POA: Diagnosis not present

## 2014-10-29 NOTE — Progress Notes (Signed)
He presents today with a chief complaint of painful elongated toenails.  Objective: Nails are thick yellow dystrophic clinically mycotic and painful on palpation and debridement.  Assessment: Pain in limb secondary to onychomycosis 1 through 5 bilateral.  Plan: Debridement of nails in thickness and length as cover service.

## 2014-11-07 DIAGNOSIS — R100 Acute abdomen: Secondary | ICD-10-CM | POA: Diagnosis not present

## 2014-11-07 DIAGNOSIS — N419 Inflammatory disease of prostate, unspecified: Secondary | ICD-10-CM | POA: Diagnosis not present

## 2014-11-07 DIAGNOSIS — S335XXA Sprain of ligaments of lumbar spine, initial encounter: Secondary | ICD-10-CM | POA: Diagnosis not present

## 2014-11-07 DIAGNOSIS — M545 Low back pain: Secondary | ICD-10-CM | POA: Diagnosis not present

## 2014-11-10 ENCOUNTER — Emergency Department: Payer: Self-pay | Admitting: Emergency Medicine

## 2014-11-10 DIAGNOSIS — E119 Type 2 diabetes mellitus without complications: Secondary | ICD-10-CM | POA: Diagnosis not present

## 2014-11-10 DIAGNOSIS — Z880 Allergy status to penicillin: Secondary | ICD-10-CM | POA: Diagnosis not present

## 2014-11-10 DIAGNOSIS — K5900 Constipation, unspecified: Secondary | ICD-10-CM | POA: Diagnosis not present

## 2014-11-10 DIAGNOSIS — I10 Essential (primary) hypertension: Secondary | ICD-10-CM | POA: Diagnosis not present

## 2014-11-10 DIAGNOSIS — Z9089 Acquired absence of other organs: Secondary | ICD-10-CM | POA: Diagnosis not present

## 2014-12-03 DIAGNOSIS — R110 Nausea: Secondary | ICD-10-CM | POA: Diagnosis not present

## 2014-12-03 DIAGNOSIS — K589 Irritable bowel syndrome without diarrhea: Secondary | ICD-10-CM | POA: Diagnosis not present

## 2014-12-19 DIAGNOSIS — H4011X3 Primary open-angle glaucoma, severe stage: Secondary | ICD-10-CM | POA: Diagnosis not present

## 2014-12-24 DIAGNOSIS — H4011X3 Primary open-angle glaucoma, severe stage: Secondary | ICD-10-CM | POA: Diagnosis not present

## 2014-12-31 DIAGNOSIS — M9901 Segmental and somatic dysfunction of cervical region: Secondary | ICD-10-CM | POA: Diagnosis not present

## 2014-12-31 DIAGNOSIS — R51 Headache: Secondary | ICD-10-CM | POA: Diagnosis not present

## 2014-12-31 DIAGNOSIS — M5413 Radiculopathy, cervicothoracic region: Secondary | ICD-10-CM | POA: Diagnosis not present

## 2014-12-31 DIAGNOSIS — M5030 Other cervical disc degeneration, unspecified cervical region: Secondary | ICD-10-CM | POA: Diagnosis not present

## 2015-01-02 DIAGNOSIS — M5030 Other cervical disc degeneration, unspecified cervical region: Secondary | ICD-10-CM | POA: Diagnosis not present

## 2015-01-02 DIAGNOSIS — R51 Headache: Secondary | ICD-10-CM | POA: Diagnosis not present

## 2015-01-02 DIAGNOSIS — M9901 Segmental and somatic dysfunction of cervical region: Secondary | ICD-10-CM | POA: Diagnosis not present

## 2015-01-02 DIAGNOSIS — M5413 Radiculopathy, cervicothoracic region: Secondary | ICD-10-CM | POA: Diagnosis not present

## 2015-01-04 DIAGNOSIS — H4011X3 Primary open-angle glaucoma, severe stage: Secondary | ICD-10-CM | POA: Diagnosis not present

## 2015-01-07 DIAGNOSIS — R51 Headache: Secondary | ICD-10-CM | POA: Diagnosis not present

## 2015-01-07 DIAGNOSIS — M9901 Segmental and somatic dysfunction of cervical region: Secondary | ICD-10-CM | POA: Diagnosis not present

## 2015-01-07 DIAGNOSIS — M5030 Other cervical disc degeneration, unspecified cervical region: Secondary | ICD-10-CM | POA: Diagnosis not present

## 2015-01-07 DIAGNOSIS — M5413 Radiculopathy, cervicothoracic region: Secondary | ICD-10-CM | POA: Diagnosis not present

## 2015-01-09 DIAGNOSIS — M5030 Other cervical disc degeneration, unspecified cervical region: Secondary | ICD-10-CM | POA: Diagnosis not present

## 2015-01-09 DIAGNOSIS — M9901 Segmental and somatic dysfunction of cervical region: Secondary | ICD-10-CM | POA: Diagnosis not present

## 2015-01-09 DIAGNOSIS — R51 Headache: Secondary | ICD-10-CM | POA: Diagnosis not present

## 2015-01-09 DIAGNOSIS — M5413 Radiculopathy, cervicothoracic region: Secondary | ICD-10-CM | POA: Diagnosis not present

## 2015-01-11 DIAGNOSIS — R51 Headache: Secondary | ICD-10-CM | POA: Diagnosis not present

## 2015-01-11 DIAGNOSIS — M5413 Radiculopathy, cervicothoracic region: Secondary | ICD-10-CM | POA: Diagnosis not present

## 2015-01-11 DIAGNOSIS — M9901 Segmental and somatic dysfunction of cervical region: Secondary | ICD-10-CM | POA: Diagnosis not present

## 2015-01-11 DIAGNOSIS — M5030 Other cervical disc degeneration, unspecified cervical region: Secondary | ICD-10-CM | POA: Diagnosis not present

## 2015-01-15 DIAGNOSIS — M5030 Other cervical disc degeneration, unspecified cervical region: Secondary | ICD-10-CM | POA: Diagnosis not present

## 2015-01-15 DIAGNOSIS — M5413 Radiculopathy, cervicothoracic region: Secondary | ICD-10-CM | POA: Diagnosis not present

## 2015-01-15 DIAGNOSIS — R51 Headache: Secondary | ICD-10-CM | POA: Diagnosis not present

## 2015-01-15 DIAGNOSIS — M9901 Segmental and somatic dysfunction of cervical region: Secondary | ICD-10-CM | POA: Diagnosis not present

## 2015-01-16 DIAGNOSIS — M9901 Segmental and somatic dysfunction of cervical region: Secondary | ICD-10-CM | POA: Diagnosis not present

## 2015-01-16 DIAGNOSIS — R51 Headache: Secondary | ICD-10-CM | POA: Diagnosis not present

## 2015-01-16 DIAGNOSIS — M5413 Radiculopathy, cervicothoracic region: Secondary | ICD-10-CM | POA: Diagnosis not present

## 2015-01-16 DIAGNOSIS — M5030 Other cervical disc degeneration, unspecified cervical region: Secondary | ICD-10-CM | POA: Diagnosis not present

## 2015-01-18 DIAGNOSIS — M5030 Other cervical disc degeneration, unspecified cervical region: Secondary | ICD-10-CM | POA: Diagnosis not present

## 2015-01-18 DIAGNOSIS — R51 Headache: Secondary | ICD-10-CM | POA: Diagnosis not present

## 2015-01-18 DIAGNOSIS — M9901 Segmental and somatic dysfunction of cervical region: Secondary | ICD-10-CM | POA: Diagnosis not present

## 2015-01-18 DIAGNOSIS — M5413 Radiculopathy, cervicothoracic region: Secondary | ICD-10-CM | POA: Diagnosis not present

## 2015-01-21 DIAGNOSIS — M5030 Other cervical disc degeneration, unspecified cervical region: Secondary | ICD-10-CM | POA: Diagnosis not present

## 2015-01-21 DIAGNOSIS — M5413 Radiculopathy, cervicothoracic region: Secondary | ICD-10-CM | POA: Diagnosis not present

## 2015-01-21 DIAGNOSIS — R51 Headache: Secondary | ICD-10-CM | POA: Diagnosis not present

## 2015-01-21 DIAGNOSIS — M9901 Segmental and somatic dysfunction of cervical region: Secondary | ICD-10-CM | POA: Diagnosis not present

## 2015-01-22 DIAGNOSIS — M9901 Segmental and somatic dysfunction of cervical region: Secondary | ICD-10-CM | POA: Diagnosis not present

## 2015-01-22 DIAGNOSIS — M5030 Other cervical disc degeneration, unspecified cervical region: Secondary | ICD-10-CM | POA: Diagnosis not present

## 2015-01-22 DIAGNOSIS — R51 Headache: Secondary | ICD-10-CM | POA: Diagnosis not present

## 2015-01-22 DIAGNOSIS — M5413 Radiculopathy, cervicothoracic region: Secondary | ICD-10-CM | POA: Diagnosis not present

## 2015-01-23 DIAGNOSIS — M5030 Other cervical disc degeneration, unspecified cervical region: Secondary | ICD-10-CM | POA: Diagnosis not present

## 2015-01-23 DIAGNOSIS — R51 Headache: Secondary | ICD-10-CM | POA: Diagnosis not present

## 2015-01-23 DIAGNOSIS — M5413 Radiculopathy, cervicothoracic region: Secondary | ICD-10-CM | POA: Diagnosis not present

## 2015-01-23 DIAGNOSIS — M9901 Segmental and somatic dysfunction of cervical region: Secondary | ICD-10-CM | POA: Diagnosis not present

## 2015-01-28 ENCOUNTER — Ambulatory Visit: Payer: Medicare Other

## 2015-01-28 DIAGNOSIS — R51 Headache: Secondary | ICD-10-CM | POA: Diagnosis not present

## 2015-01-28 DIAGNOSIS — M5030 Other cervical disc degeneration, unspecified cervical region: Secondary | ICD-10-CM | POA: Diagnosis not present

## 2015-01-28 DIAGNOSIS — M5413 Radiculopathy, cervicothoracic region: Secondary | ICD-10-CM | POA: Diagnosis not present

## 2015-01-28 DIAGNOSIS — M9901 Segmental and somatic dysfunction of cervical region: Secondary | ICD-10-CM | POA: Diagnosis not present

## 2015-01-30 DIAGNOSIS — M9901 Segmental and somatic dysfunction of cervical region: Secondary | ICD-10-CM | POA: Diagnosis not present

## 2015-01-30 DIAGNOSIS — M5030 Other cervical disc degeneration, unspecified cervical region: Secondary | ICD-10-CM | POA: Diagnosis not present

## 2015-01-30 DIAGNOSIS — M5413 Radiculopathy, cervicothoracic region: Secondary | ICD-10-CM | POA: Diagnosis not present

## 2015-01-30 DIAGNOSIS — R51 Headache: Secondary | ICD-10-CM | POA: Diagnosis not present

## 2015-01-31 DIAGNOSIS — H4011X3 Primary open-angle glaucoma, severe stage: Secondary | ICD-10-CM | POA: Diagnosis not present

## 2015-02-04 ENCOUNTER — Ambulatory Visit (INDEPENDENT_AMBULATORY_CARE_PROVIDER_SITE_OTHER): Payer: Medicare Other | Admitting: Podiatry

## 2015-02-04 DIAGNOSIS — B351 Tinea unguium: Secondary | ICD-10-CM | POA: Diagnosis not present

## 2015-02-04 DIAGNOSIS — M79676 Pain in unspecified toe(s): Secondary | ICD-10-CM | POA: Diagnosis not present

## 2015-02-04 NOTE — Progress Notes (Signed)
He presents today with a chief complaint of painful elongated toenails. Been using Topicort 0.5% for R lat. Foot pruritis.  Objective: Nails are thick yellow dystrophic clinically mycotic and painful on palpation and debridement.  Assessment: Pain in limb secondary to onychomycosis 1 through 5 bilateral.  Mild B hallux pinch callus.  Plan: Debridement of nails in thickness and length as cover service.

## 2015-02-06 DIAGNOSIS — R51 Headache: Secondary | ICD-10-CM | POA: Diagnosis not present

## 2015-02-06 DIAGNOSIS — M9901 Segmental and somatic dysfunction of cervical region: Secondary | ICD-10-CM | POA: Diagnosis not present

## 2015-02-06 DIAGNOSIS — M5030 Other cervical disc degeneration, unspecified cervical region: Secondary | ICD-10-CM | POA: Diagnosis not present

## 2015-02-06 DIAGNOSIS — M5413 Radiculopathy, cervicothoracic region: Secondary | ICD-10-CM | POA: Diagnosis not present

## 2015-02-19 NOTE — H&P (Signed)
PATIENT NAME:  Austin Hunt, Austin Hunt MR#:  [id number] DATE OF BIRTH:  [birth year]  DATE OF ADMISSION:  06/13/2012  PRIMARY CARE PHYSICIAN: Dr. Rutherford Nail   CHIEF COMPLAINT: Syncope.   HISTORY OF PRESENT ILLNESS: This is a 74 year old male who presents to the Emergency Room via EMS secondary to a syncopal episode. The patient said that he was feeling a little bit lightheaded and dizzy this afternoon, thought his blood sugar was low, and therefore decided to eat some potato chips and juice.  Then while he was drinking the juice and eating the chips he became more and more diaphoretic. As per his son, the patient then kind of had a syncopal episode, as he was out for about 30 seconds and his eyes rolled backwards and he was not arousable. When the patient awoke, his son had already called EMS and he notified his father, and father could not recall any of these events. The patient denies any chest pain or any shortness of breath. He did have two episodes of vomiting when he woke up from the syncopal state. There was no seizure-type activity and there was no incontinence. He was brought to the ER for further evaluation. Clinically the patient feels fine now with no acute symptoms and has had no further syncopal episodes or any episodes of nausea or vomiting since being in the Emergency Room.   REVIEW OF SYSTEMS:  CONSTITUTIONAL: No documented fever. No weight gain or weight loss. EYES: No blurred or double vision. ENT: No tinnitus. No postnasal drip. No redness of the oropharynx.  RESPIRATORY: No cough, no wheeze, no hemoptysis, no dyspnea.  CARDIOVASCULAR: No chest pain, no orthopnea, no palpitations. Positive syncope. GI: Positive nausea. Positive vomiting. No diarrhea. No abdominal pain. No melena or hematochezia.  GU: No dysuria or hematuria. ENDOCRINE: No polyuria or nocturia. No heat or cold intolerance. HEME: No anemia, no bruising, no bleeding. INTEGUMENT: No rashes. No lesions. MUSCULOSKELETAL: No arthritis, no  swelling, no gout. NEUROLOGIC: No numbness, no tingling, no ataxia, no seizure-type activity. PSYCH: No anxiety, no insomnia, no ADD.   PAST MEDICAL HISTORY:  1. Hypertension.  2. Glaucoma.  3. Diabetes.  4. Benign prostatic hypertrophy.  5. Hyperlipidemia.  6. History of concussion.   ALLERGIES: Penicillin, reaction is unknown.   SOCIAL HISTORY: No smoking. No alcohol abuse. No illicit drug abuse. Lives at home with his wife.   FAMILY HISTORY: His father died from suicide attempt. He cannot recall what his mother died from.   CURRENT MEDICATIONS:  1. Altace 5 mg daily.  2. Combigan, one drop to each eye twice a day.  3. Fish oil 1 tab daily.  4. Jalyn 0.5/0.4 mg, 1 tab daily.  5. Metformin 500 mg b.i.d.  6. Travatan eyedrops 0.004%, one drop to each eye at bedtime. 7. Zetia 10 mg daily.   PHYSICAL EXAMINATION ON ADMISSION:  VITAL SIGNS: Temperature 96, pulse 72, respirations 16, blood pressure 151/90, sats 99 percent on room air.   GENERAL: He is a pleasant appearing male in no apparent distress.   HEENT: Atraumatic, normocephalic. Extraocular muscles are intact. Pupils are round and reactive to light. Sclerae anicteric. No conjunctival injection. No pharyngeal erythema.   NECK: Supple. No jugular venous distention. No bruits, no lymphadenopathy or thyromegaly.   HEART: Regular rate and rhythm. No murmurs, rubs, or clicks.   LUNGS: Clear to auscultation bilaterally. No rales, no rhonchi, no wheezes.   ABDOMEN: Soft, flat, nontender, nondistended. Has good bowel sounds. No hepatosplenomegaly appreciated.  EXTREMITIES: No evidence of any cyanosis, clubbing, or peripheral edema. Has +2 pedal and radial pulses bilaterally.   NEUROLOGICAL: The patient is alert, awake, and oriented times three with no focal motor or sensory deficits appreciated bilaterally.   SKIN: Moist and warm with no rash appreciated.   LYMPHATIC: There is no cervical or axillary lymphadenopathy.    LABORATORY, DIAGNOSTIC, AND RADIOLOGICAL DATA:  Serum glucose 180, BUN 15, creatinine 0.9, sodium 139, potassium 3.5, chloride 103, bicarbonate 25. The patient's LFTs are within normal limits. Troponin less than 0.02. White cell count 4.6, hemoglobin 14.3, hematocrit 42.2, platelet count 209. Urinalysis is within normal limits.   The patient did have a CT of the head done which showed no evidence of any acute intracranial abnormality. The patient also had an EKG done which showed normal sinus rhythm with normal axes and no evidence of any acute ST or T-wave changes.   ASSESSMENT AND PLAN: This is a 74 year old male with past medical history of glaucoma, diabetes, hypertension, hyperlipidemia, and benign prostatic hypertrophy who presents to the hospital with a syncopal episode.  1. Syncope: The exact etiology of the syncope is currently unclear. Questionable if this hypoglycemia-related, as the patient was feeling a bit woozy and diaphoretic and this happened shortly after when he was trying to get his blood sugar up, although when EMS arrived they did a fingerstick and it was 177. Also questionable if this is vasovagal in nature. I do not see any acute evidence of neural or cardiogenic syncope. The patient's orthostatics are negative. His CT of the head is normal. I will observe him overnight on telemetry, get serial cardiac markers, get a two-dimensional echocardiogram, also get a carotid duplex and follow for any further episodes of syncope.  2. Hypertension: As mentioned, no evidence of any orthostatic hypotension. Continue with his Altace.  3. Diabetes: Continue metformin.  4. Hyperlipidemia: Continue Zetia.  5. Benign prostatic hypertrophy: Continue with Flomax and dutasteride.   CODE STATUS: The patient is a FULL CODE.    TIME SPENT: 45 minutes.   ____________________________ Belia Heman. Verdell Carmine, MD vjs:bjt D: 06/13/2012 21:31:46 ET T: 06/14/2012 08:47:47 ET JOB#: [id number]  cc: Belia Heman.  Verdell Carmine, MD, <Dictator> Ashok Norris, MD Henreitta Leber MD ELECTRONICALLY SIGNED 06/18/2012 10:48

## 2015-02-19 NOTE — Discharge Summary (Signed)
PATIENT NAME:  Austin Hunt, Austin Hunt MR#:  [id number] DATE OF BIRTH:  02-Oct-1941  DATE OF ADMISSION:  06/13/2012 DATE OF DISCHARGE:  06/14/2012  DISCHARGE DIAGNOSIS: Syncope likely vasovagal with negative serial cardiac enzymes, normal echocardiogram and negative carotid Doppler's.   SECONDARY DIAGNOSES:  1. Hypertension.  2. Glaucoma. 3. Diabetes.  4. Benign prostatic hypertrophy.   5. Hyperlipidemia.  6. History of concussion.   CONSULTATION: None.   LABORATORY, DIAGNOSTIC AND RADIOLOGICAL DATA: Chest x-ray 08/12 showed no acute cardiopulmonary disease.   CT scan of the head without contrast on 08/12 showed no acute disease.   Bilateral carotid Doppler's on 08/13 showed atherosclerotic disease with stenosis. Right proximal ICA 57% and left proximal ICA 65% stenosed.   2-D echocardiogram on 08/13 showed normal chamber size and function with mild left ventricular hypertrophy, mild mitral regurgitation. No evidence of atrial septal defect or hypertrophic cardiomyopathy. Ejection fraction more than 55%.   Urinalysis on admission was negative.   HISTORY AND SHORT HOSPITAL COURSE: Patient is a 74 year old male with above-mentioned medical problems was admitted for syncope, was thought to be secondary to vasovagal in nature. Underwent echocardiogram and carotid Doppler's which were within normal limits. He was also ruled out with three negative sets of cardiac enzymes. He was feeling much better. He was monitored on telemetry and did not have any arrhythmias while in the hospital. He was discharged home in stable condition. He had minimal orthostasis. Overall orthostatic vitals were negative.   PHYSICAL EXAMINATION: VITAL SIGNS: On the date of discharge his vital signs were as follows: Temperature 97.7, heart rate 60 per minute, respirations 18 per minute, blood pressure 128/78 mmHg. He was saturating 95% on room air. Pertinent Physical Examination on the date of discharge: CARDIOVASCULAR: S1, S2  normal. No murmurs, rubs, gallop. LUNGS: Clear to auscultation bilaterally. No wheezes, rales, rhonchi, crepitation. ABDOMEN: Soft, benign. NEUROLOGIC: Nonfocal examination. All other physical examination remained at the baseline.   DISCHARGE MEDICATIONS:  1. Metformin 500 mg p.o. b.i.d.  2. Altace 5 mg p.o. daily. 3. Zetia 10 mg p.o. daily.  4. Fish oil once daily.  5. Jalyn 1 capsule p.o. daily.  6. Combigan eyedrops to each eye twice a day.  7. Travatan eyedrops to each eye at bedtime.   DISCHARGE DIET: Low sodium, 1800 ADA.   DISCHARGE ACTIVITY: As tolerated.   DISCHARGE INSTRUCTIONS AND FOLLOW UP: Patient was instructed to follow up with his primary care physician, Dr. Ashok Norris, in 1 to 2 weeks. He will need follow up with [facility] neurology in 4 to 6 weeks.   TOTAL TIME DISCHARGING THIS PATIENT: 45 minutes.   ____________________________ Lucina Mellow. Manuella Ghazi, MD vss:cms D: 06/14/2012 23:45:24 ET T: 06/16/2012 11:21:00 ET JOB#: [id number] cc: Khasir Woodrome S. Manuella Ghazi, MD, <Dictator> Ashok Norris, MD Hemang K. Manuella Ghazi, MD Algernon Huxley, MD Lucina Mellow [facility] MD ELECTRONICALLY SIGNED 06/16/2012 15:56

## 2015-02-20 DIAGNOSIS — M5413 Radiculopathy, cervicothoracic region: Secondary | ICD-10-CM | POA: Diagnosis not present

## 2015-02-20 DIAGNOSIS — M5030 Other cervical disc degeneration, unspecified cervical region: Secondary | ICD-10-CM | POA: Diagnosis not present

## 2015-02-20 DIAGNOSIS — M9901 Segmental and somatic dysfunction of cervical region: Secondary | ICD-10-CM | POA: Diagnosis not present

## 2015-02-20 DIAGNOSIS — R51 Headache: Secondary | ICD-10-CM | POA: Diagnosis not present

## 2015-02-23 NOTE — Discharge Summary (Signed)
Dates of Admission and Diagnosis:  Date of Admission 27-Aug-2014   Date of Discharge 28-Aug-2014   Admitting Diagnosis BPH with outlet obstruction, bladder stones   Final Diagnosis BPH with outlet obstruction, bladder stones    Chief Complaint/History of Present Illness Admitted post op TURP   Allergies:  Penicillin: Itching  Routine Chem:  27-Oct-15 05:16   Glucose, Serum  118  BUN 9  Creatinine (comp) 0.87  Sodium, Serum 145  Potassium, Serum 3.9  Chloride, Serum  109  CO2, Serum 26  Calcium (Total), Serum  8.3  Anion Gap 10  Osmolality (calc) 288  eGFR (African American) >60  eGFR (Non-African American) >60 (eGFR values <62m/min/1.73 m2 may be an indication of chronic kidney disease (CKD). Calculated eGFR, using the MRDR Study equation, is useful in  patients with stable renal function. The eGFR calculation will not be reliable in acutely ill patients when serum creatinine is changing rapidly. It is not useful in patients on dialysis. The eGFR calculation may not be applicable to patients at the low and high extremes of body sizes, pregnant women, and vetetarians.)  Routine Hem:  27-Oct-15 05:16   WBC (CBC) 5.7  RBC (CBC) 4.61  Hemoglobin (CBC) 13.3  Hematocrit (CBC) 41.3  Platelet Count (CBC) 181  MCV 90  MCH 28.8  MCHC 32.2  RDW 13.8  Neutrophil % 67.4  Lymphocyte % 21.7  Monocyte % 8.1  Eosinophil % 2.4  Basophil % 0.4  Neutrophil # 3.8  Lymphocyte # 1.2  Monocyte # 0.5  Eosinophil # 0.1  Basophil # 0.0 (Result(s) reported on 28 Aug 2014 at 06:11AM.)   Pertinent Past History:  Pertinent Past History see H&P   Hospital Course:  Hospital Course Patient was admitted s/p TURP, evacuationi of bladder stones for overnight observation, CBI.  In the am, his CBI was stopped and he underwent a sucessful voiding trial. Labs and vitals were stable.  He was discharged home in stable condition.   Condition on Discharge Good   Code Status:  Code Status  Full Code   DISCHARGE INSTRUCTIONS HOME MEDS:  Medication Reconciliation: Patient's Home Medications at Discharge:     Medication Instructions  jalyn 0.5 mg-0.4 mg oral capsule  1 cap(s) orally once a day   combigan 0.2%-0.5% ophthalmic solution  1 drop(s) to each eye 2 times a day   travatan z 0.004% ophthalmic solution  1 drop(s) to each eye once a day (at bedtime)   vitamin b12 100 mcg oral tablet  1 tab(s) orally once a week   dorzolamide ophthalmic 2% ophthalmic solution  1 drop(s) to each affected eye 2 times a day   aspirin 81 mg oral tablet  1 tab(s) orally once a day   vitamin d3 400 intl units oral tablet  1 tab(s) orally once a day   zinc  tab(s) orally once a day   milk of magnesia  dose(s) orally , As Needed - for Constipation   metformin extended release 1000 mg oral tablet, extended release  2 tab(s) orally pm   metformin extended release 1000 mg oral tablet, extended release  1000 milligram(s) orally once a day (in the morning)   metformin 1000 mg oral tablet, extended release  2 tab(s) orally    metformin 1000 mg oral tablet, extended release  1 tab(s) orally once a day   acetaminophen-oxycodone 325 mg-5 mg oral tablet  1 tab(s) orally every 4 hours, As Needed, moderate pain (4-6/10) - for Pain , As needed,  moderate pain (4-6/10)   colace sodium 100 mg oral capsule  1 cap(s) orally 2 times a day, As Needed - for Constipation    PRESCRIPTIONS: PRINTED AND PLACED ON CHART   Physician's Instructions:  Home Health? No   Treatments None   Home Oxygen? No   Diet Carbohydrate Controlled (ADA) Diet   Dietary Supplements None   Diet Consistency Regular Consistency   Activity Limitations No exertional activity  for 2 weeks   Return to Work after follow up visit with MD   Time frame for Follow Up Appointment 4-6 weeks     Hollice Espy J(Attending Physician): [place], [address], [place], [place], [place] [address], [place] [phone]  TIME  SPENT:  Total Time: 30 minutes or less   Electronic Signatures: Sherlynn Stalls (MD)  (Signed 27-Oct-15 08:01)  Authored: ADMISSION DATE AND DIAGNOSIS, CHIEF COMPLAINT/HPI, Allergies, PERTINENT LABS, PERTINENT PAST HISTORY, HOSPITAL COURSE, DISCHARGE INSTRUCTIONS HOME MEDS, PATIENT INSTRUCTIONS, Follow Up Physician, TIME SPENT   Last Updated: 27-Oct-15 08:01 by Sherlynn Stalls (MD)

## 2015-02-23 NOTE — Op Note (Signed)
PATIENT NAME:  Austin Hunt, WHITWELL MR#:  [id number] DATE OF BIRTH:  [birth year]  DATE OF PROCEDURE:  08/27/2014   PREOPERATIVE DIAGNOSIS: Benign prostatic hypertrophy with bladder outlet obstruction, bladder stones.   POSTOPERATIVE DIAGNOSIS: Benign prostatic hypertrophy with bladder outlet obstruction, bladder stones.    PROCEDURE PERFORMED:  Transurethral resection of the prostate (bipolar), evacuation of bladder stones.   ATTENDING SURGEON: Sherlynn Stalls, MD   ANESTHESIA: General.   ESTIMATED BLOOD LOSS: 50 mL.   SPECIMENS: Bladder stone, prostate chips.   DRAINS: A 20 French 3-way Foley catheter with CBI.   COMPLICATIONS: None.   INDICATION: This is a 74 year old male with a significant obstruction voiding symptoms and history of bladder stones status post previous cystolitholapaxy who returned to clinic with new bladder stones and significant obstructing voiding symptoms. Cystoscopy revealed a large bi-lobed median lobe and significantly trabeculated bladder with some small stones.  He was counseled to undergo an outlet procedure with evacuation of the stones at the same time.  Risks and benefits of the procedure explained in detail. The patient agreed to proceed as planned.   PROCEDURE: The patient was correctly identified in the preoperative holding area and informed consent was confirmed.  He was brought to the operating suite and placed on the table in the supine position. At this time, universal timeout protocol was performed. All team members were identified. Venodyne boots were placed and he was administered 500 mg IV Levaquin in the perioperative period.  He was then placed under general anesthesia, repositioned lower on the bed in the dorsal lithotomy position and prepped and draped in standard surgical fashion.  A 26 French Olympus resectoscope was then advanced per urethra into the bladder and the bladder was inspected.  This revealed several small stones, no greater than 7 mm in  diameter throughout the bladder. These were evacuated out without difficulty or need for additional fractionation.  The bladder was severely trabeculated with 1 diverticulum noted on the right lateral posterior wall.  The trigone was able to be clearly visualized and was a good distance from the bladder neck.  The bladder neck was noted to be elevated with a bi-lobed median lobe. There was no significant lateral lobe coaptation. The prostatic length was relatively short, approximately 3 cm in length. At this point in time, using saline as the irrigant, the bipolar loop was used to resect both of these median lobes down to the level of the bladder neck fibers. Care was taken not undermine the bladder neck and each lateral lobe was then resected down to the level of the capsule. A small portion of anterior tissue was also resected. Care was taken to avoid any resection distal to the verumontanum.  Hemostasis then was achieved using the coag setting of the loop.  The prostate chips were irrigated out as well as any remaining bladder stones. Once the bladder was free and adequate hemostasis was achieved with irrigation off, the scope was then removed.  A 20 French 3-way Foley catheter was then advanced per urethra into the bladder and CBI was initiated with a slow drip to keep the urine clear.  The patient was then repositioned in the supine position, reversed from anesthesia and taken to the PACU in stable condition. There were no complications in this case.     ____________________________ Sherlynn Stalls, MD ajb:DT D: 08/27/2014 12:18:38 ET T: 08/27/2014 20:43:45 ET JOB#: [id number]  cc: Sherlynn Stalls, MD, <Dictator> Sherlynn Stalls MD ELECTRONICALLY SIGNED 09/20/2014 15:12

## 2015-02-23 NOTE — Op Note (Signed)
PATIENT NAME:  Austin Hunt, Austin Hunt MR#:  [id number] DATE OF BIRTH:  1941-07-29  DATE OF PROCEDURE:  05/10/2014  PREOPERATIVE DIAGNOSIS: Right inguinal hernia.   POSTOPERATIVE DIAGNOSIS: Right femoral hernia.   OPERATIVE PROCEDURE: Right femoral hernia repair with Bard plug.   SURGEON: Hervey Ard, MD    ANESTHESIA: General by LMA under Dr. Marcello Moores, Marcaine 0.5% with 1:200,000 units epinephrine, 30 mL local infiltration, Toradol 30 mg.   CLINICAL NOTE: This 74 year old male has developed a symptomatic bulge in the right groin. Clinical exam was consistent with an inguinal hernia. He was admitted for elective repair.   OPERATIVE NOTE: The patient received Kefzol prior to the procedure. After undergoing general anesthesia, the lower abdomen was prepped with ChloraPrep and draped. Hair had previously been removed with clippers. A 5 cm skin line incision along the anticipated course of the inguinal canal was carried down through the skin and subcutaneous tissue with hemostasis achieved by electrocautery. The skin was incised sharply and the remaining dissection completed with electrocautery. The external oblique was opened in the direction of its fibers. The cord was examined at the level of the internal inguinal ring and there was no evidence of a direct defect. The medial floor was intact. Moving below the inguinal ligament, a femoral hernia was identified. This was freed circumferentially and was subsequently reduced. The defect was approximately 1 cm in diameter. A medium Bard PerFix plug was placed and anchored into position with 0 Surgilon sutures x 2. The floor of the inguinal canal was again examined and there was no evidence of weakness and, for that reason, an onlay patch was not placed. The external oblique was closed with a running 2-0 Vicryl followed by instillation of 30 mg of Toradol. Scarpa's fascia was closed with a running 3-0 Vicryl and the skin closed with a running 4-0 Vicryl  subcuticular suture. Benzoin, Steri-Strips, Telfa, and Tegaderm dressing were applied.   The patient tolerated the procedure well and was taken to the recovery room in stable condition.      ____________________________ Robert Bellow, MD jwb:dd D: 05/10/2014 21:10:13 ET T: 05/11/2014 02:47:32 ET JOB#: [id number]  cc: Robert Bellow, MD, <Dictator> Ashok Norris, MD JEFFREY Amedeo Kinsman MD ELECTRONICALLY SIGNED 05/11/2014 13:06

## 2015-02-25 LAB — SURGICAL PATHOLOGY

## 2015-03-03 HISTORY — PX: EYE SURGERY: SHX253

## 2015-03-12 DIAGNOSIS — I10 Essential (primary) hypertension: Secondary | ICD-10-CM | POA: Diagnosis not present

## 2015-03-12 DIAGNOSIS — E119 Type 2 diabetes mellitus without complications: Secondary | ICD-10-CM | POA: Diagnosis not present

## 2015-03-12 DIAGNOSIS — I209 Angina pectoris, unspecified: Secondary | ICD-10-CM | POA: Diagnosis not present

## 2015-03-12 DIAGNOSIS — I2510 Atherosclerotic heart disease of native coronary artery without angina pectoris: Secondary | ICD-10-CM | POA: Diagnosis not present

## 2015-03-14 DIAGNOSIS — H4011X3 Primary open-angle glaucoma, severe stage: Secondary | ICD-10-CM | POA: Diagnosis not present

## 2015-03-19 DIAGNOSIS — C61 Malignant neoplasm of prostate: Secondary | ICD-10-CM | POA: Diagnosis not present

## 2015-03-19 DIAGNOSIS — N401 Enlarged prostate with lower urinary tract symptoms: Secondary | ICD-10-CM | POA: Diagnosis not present

## 2015-04-08 ENCOUNTER — Ambulatory Visit (INDEPENDENT_AMBULATORY_CARE_PROVIDER_SITE_OTHER): Payer: Medicare Other | Admitting: Family Medicine

## 2015-04-08 ENCOUNTER — Encounter: Payer: Self-pay | Admitting: Family Medicine

## 2015-04-08 ENCOUNTER — Encounter (INDEPENDENT_AMBULATORY_CARE_PROVIDER_SITE_OTHER): Payer: Self-pay

## 2015-04-08 VITALS — BP 142/82 | HR 64 | Temp 97.4°F | Resp 16 | Ht 67.0 in | Wt 143.3 lb

## 2015-04-08 DIAGNOSIS — K589 Irritable bowel syndrome without diarrhea: Secondary | ICD-10-CM | POA: Insufficient documentation

## 2015-04-08 DIAGNOSIS — N2889 Other specified disorders of kidney and ureter: Secondary | ICD-10-CM | POA: Insufficient documentation

## 2015-04-08 DIAGNOSIS — K5900 Constipation, unspecified: Secondary | ICD-10-CM | POA: Diagnosis not present

## 2015-04-08 DIAGNOSIS — Z9889 Other specified postprocedural states: Secondary | ICD-10-CM

## 2015-04-08 DIAGNOSIS — M545 Low back pain, unspecified: Secondary | ICD-10-CM | POA: Insufficient documentation

## 2015-04-08 DIAGNOSIS — I6529 Occlusion and stenosis of unspecified carotid artery: Secondary | ICD-10-CM | POA: Insufficient documentation

## 2015-04-08 DIAGNOSIS — Z889 Allergy status to unspecified drugs, medicaments and biological substances status: Secondary | ICD-10-CM | POA: Diagnosis not present

## 2015-04-08 DIAGNOSIS — E1165 Type 2 diabetes mellitus with hyperglycemia: Secondary | ICD-10-CM

## 2015-04-08 DIAGNOSIS — K2100 Gastro-esophageal reflux disease with esophagitis, without bleeding: Secondary | ICD-10-CM

## 2015-04-08 DIAGNOSIS — Z8719 Personal history of other diseases of the digestive system: Secondary | ICD-10-CM | POA: Insufficient documentation

## 2015-04-08 DIAGNOSIS — F0781 Postconcussional syndrome: Secondary | ICD-10-CM | POA: Insufficient documentation

## 2015-04-08 DIAGNOSIS — I340 Nonrheumatic mitral (valve) insufficiency: Secondary | ICD-10-CM | POA: Insufficient documentation

## 2015-04-08 DIAGNOSIS — I10 Essential (primary) hypertension: Secondary | ICD-10-CM

## 2015-04-08 DIAGNOSIS — K5909 Other constipation: Secondary | ICD-10-CM | POA: Insufficient documentation

## 2015-04-08 DIAGNOSIS — I6523 Occlusion and stenosis of bilateral carotid arteries: Secondary | ICD-10-CM

## 2015-04-08 DIAGNOSIS — F419 Anxiety disorder, unspecified: Secondary | ICD-10-CM | POA: Insufficient documentation

## 2015-04-08 DIAGNOSIS — I071 Rheumatic tricuspid insufficiency: Secondary | ICD-10-CM | POA: Insufficient documentation

## 2015-04-08 DIAGNOSIS — E785 Hyperlipidemia, unspecified: Secondary | ICD-10-CM | POA: Diagnosis not present

## 2015-04-08 DIAGNOSIS — IMO0002 Reserved for concepts with insufficient information to code with codable children: Secondary | ICD-10-CM

## 2015-04-08 DIAGNOSIS — N529 Male erectile dysfunction, unspecified: Secondary | ICD-10-CM | POA: Insufficient documentation

## 2015-04-08 DIAGNOSIS — K5904 Chronic idiopathic constipation: Secondary | ICD-10-CM | POA: Insufficient documentation

## 2015-04-08 DIAGNOSIS — IMO0001 Reserved for inherently not codable concepts without codable children: Secondary | ICD-10-CM | POA: Insufficient documentation

## 2015-04-08 DIAGNOSIS — N400 Enlarged prostate without lower urinary tract symptoms: Secondary | ICD-10-CM | POA: Diagnosis not present

## 2015-04-08 DIAGNOSIS — R5383 Other fatigue: Secondary | ICD-10-CM | POA: Insufficient documentation

## 2015-04-08 DIAGNOSIS — Z789 Other specified health status: Secondary | ICD-10-CM | POA: Insufficient documentation

## 2015-04-08 LAB — POCT GLYCOSYLATED HEMOGLOBIN (HGB A1C): Hemoglobin A1C: 7.5

## 2015-04-08 NOTE — Patient Instructions (Signed)
F/u in 4 mo 

## 2015-04-08 NOTE — Progress Notes (Signed)
Name: Austin Hunt   MRN: [id number]    DOB: 26-Dec-1940   Date:04/08/2015       Progress Note  Subjective  Chief Complaint  Chief Complaint  Patient presents with  . Hypertension  . Diabetes  . Hyperlipidemia    Hypertension This is a chronic problem. The current episode started more than 1 year ago. The problem has been gradually improving since onset. The problem is uncontrolled. Associated symptoms include anxiety, blurred vision (glaucoma) and headaches. Pertinent negatives include no chest pain, orthopnea or palpitations. There are no associated agents to hypertension. Risk factors for coronary artery disease include diabetes mellitus and dyslipidemia. Past treatments include ACE inhibitors. The current treatment provides moderate improvement. There are no compliance problems.   Diabetes He presents for his follow-up diabetic visit. He has type 2 diabetes mellitus. No MedicAlert identification noted. His disease course has been stable. Hypoglycemia symptoms include confusion, dizziness, headaches, hunger and nervousness/anxiousness. Associated symptoms include blurred vision (glaucoma) and visual change. Pertinent negatives for diabetes include no chest pain, no polydipsia and no polyphagia. There are no hypoglycemic complications. Symptoms are improving. Diabetic complications include nephropathy. Pertinent negatives for diabetic complications include no peripheral neuropathy. Risk factors for coronary artery disease include diabetes mellitus, dyslipidemia, male sex and hypertension. Current diabetic treatment includes oral agent (monotherapy) (metformin). He is following a diabetic diet. He participates in exercise three times a week. There is no change in his home blood glucose trend. An ACE inhibitor/angiotensin II receptor blocker is being taken. He sees a podiatrist.Eye exam is current.  Hyperlipidemia This is a chronic problem. The current episode started more than 1 year ago. The  problem is uncontrolled. Recent lipid tests were reviewed and are high. Exacerbating diseases include diabetes. Pertinent negatives include no chest pain. Current antihyperlipidemic treatment includes diet change. The current treatment provides mild improvement of lipids. Compliance problems include medication side effects.  Risk factors for coronary artery disease include diabetes mellitus, dyslipidemia and hypertension.      Past Medical History  Diagnosis Date  . Diabetes mellitus without complication   . Glaucoma     History  Substance Use Topics  . Smoking status: Never Smoker   . Smokeless tobacco: Never Used  . Alcohol Use: No     Current outpatient prescriptions:  .  aspirin EC 81 MG tablet, Take 1 tablet by mouth daily., Disp: , Rfl:  .  brimonidine-timolol (COMBIGAN) 0.2-0.5 % ophthalmic solution, Apply 1 tablet to eye daily., Disp: , Rfl:  .  Cyanocobalamin 1000 MCG TBCR, Take 1 tablet by mouth daily., Disp: , Rfl:  .  Dutasteride-Tamsulosin HCl 0.5-0.4 MG CAPS, Take 1 tablet by mouth daily., Disp: , Rfl:  .  metFORMIN (GLUCOPHAGE) 500 MG tablet, Take 1 tablet by mouth 2 (two) times daily., Disp: , Rfl:  .  Multiple Vitamins tablet, Take 1 tablet by mouth daily., Disp: , Rfl:  .  ramipril (ALTACE) 2.5 MG capsule, Take 1 tablet by mouth every other day., Disp: , Rfl:  .  travoprost, benzalkonium, (TRAVATAN) 0.004 % ophthalmic solution, Apply 1 drop to eye at bedtime., Disp: , Rfl:  .  Zinc Acetate 50 MG CAPS, Take 1 capsule by mouth daily., Disp: , Rfl:  .  aspirin 81 MG tablet, Take 81 mg by mouth daily., Disp: , Rfl:  .  brimonidine-timolol (COMBIGAN) 0.2-0.5 % ophthalmic solution, Place 1 drop into both eyes every 12 (twelve) hours., Disp: , Rfl:  .  Cyanocobalamin (VITAMIN B-12 PO), Take by  mouth daily., Disp: , Rfl:  .  Dutasteride-Tamsulosin HCl (JALYN) 0.5-0.4 MG CAPS, Take by mouth daily., Disp: , Rfl:  .  HYDROCODONE-ACETAMINOPHEN PO, Take by mouth as needed.,  Disp: , Rfl:  .  metFORMIN (GLUCOPHAGE) 500 MG tablet, Take 500 mg by mouth 3 (three) times daily. One in the morning and two at bedtime, Disp: , Rfl:  .  Multiple Vitamin (MULTIVITAMIN) tablet, Take 1 tablet by mouth daily., Disp: , Rfl:  .  OMEGA-3 FATTY ACIDS PO, Take 1 tablet by mouth daily., Disp: , Rfl:  .  ramipril (ALTACE) 2.5 MG capsule, Take 2.5 mg by mouth daily., Disp: , Rfl:  .  travoprost, benzalkonium, (TRAVATAN) 0.004 % ophthalmic solution, 1 drop at bedtime., Disp: , Rfl:  .  Zinc 50 MG CAPS, Take 1 capsule by mouth daily., Disp: , Rfl:   Allergies  Allergen Reactions  . Penicillins Itching    Review of Systems  Constitutional: Negative.   Eyes: Positive for blurred vision (glaucoma).  Respiratory: Negative.   Cardiovascular: Negative.  Negative for chest pain, palpitations, orthopnea and claudication.  Gastrointestinal: Negative.   Genitourinary: Negative.   Musculoskeletal: Positive for back pain.  Neurological: Positive for dizziness and headaches. Negative for tingling.  Endo/Heme/Allergies: Negative for polydipsia and polyphagia. Does not bruise/bleed easily.  Psychiatric/Behavioral: Positive for confusion. Negative for depression. The patient is nervous/anxious.       Objective  Filed Vitals:   04/08/15 0837  BP: 142/82  Pulse: 64  Temp: 97.4 F (36.3 C)  Resp: 16  Height: 5' 7"  (1.702 m)  Weight: 143 lb 5 oz (65.006 kg)  SpO2: 98%     Physical Exam  Constitutional: He is oriented to person, place, and time and well-developed, well-nourished, and in no distress.  HENT:  Head: Normocephalic and atraumatic.  Eyes: Pupils are equal, round, and reactive to light.  Neck: No tracheal deviation present. No thyromegaly present.  Cardiovascular: Normal rate, regular rhythm and normal heart sounds.   No murmur heard. Pulmonary/Chest: Breath sounds normal. No respiratory distress. He has no wheezes.  Musculoskeletal: He exhibits no edema.   Lymphadenopathy:    He has no cervical adenopathy.  Neurological: He is alert and oriented to person, place, and time.  Skin: Skin is warm and dry.  Psychiatric: Affect normal.  Vitals reviewed.      Assessment & Plan  1. Diabetes mellitus type 2, uncontrolled Stable diarrhe as side effect Split dosage and take mid meal - POCT Glucose (CBG) - POCT HgB A1C - POCT UA - Glucose/Protein  2. Benign essential HTN controlled  3. HLD (hyperlipidemia) Statin intolerance - Lipid Profile - Comp Met (CMET) - TSH  4. Carotid artery narrowing, bilateral  5. Statin intolerance  6. CAD STABLE

## 2015-04-09 LAB — COMPREHENSIVE METABOLIC PANEL
ALT: 12 IU/L (ref 0–44)
AST: 18 IU/L (ref 0–40)
Albumin/Globulin Ratio: 1.4 (ref 1.1–2.5)
Albumin: 4.4 g/dL (ref 3.5–4.8)
Alkaline Phosphatase: 87 IU/L (ref 39–117)
BUN/Creatinine Ratio: 21 (ref 10–22)
BUN: 18 mg/dL (ref 8–27)
Bilirubin Total: 0.3 mg/dL (ref 0.0–1.2)
CO2: 27 mmol/L (ref 18–29)
Calcium: 9.9 mg/dL (ref 8.6–10.2)
Chloride: 101 mmol/L (ref 97–108)
Creatinine, Ser: 0.84 mg/dL (ref 0.76–1.27)
GFR calc Af Amer: 99 mL/min/{1.73_m2} (ref >59)
GFR calc non Af Amer: 86 mL/min/{1.73_m2} (ref >59)
Globulin, Total: 3.1 g/dL (ref 1.5–4.5)
Glucose: 142 mg/dL — ABNORMAL HIGH (ref 65–99)
Potassium: 4.5 mmol/L (ref 3.5–5.2)
Sodium: 141 mmol/L (ref 134–144)
Total Protein: 7.5 g/dL (ref 6.0–8.5)

## 2015-04-09 LAB — LIPID PANEL
Chol/HDL Ratio: 4.4 ratio units (ref 0.0–5.0)
Cholesterol, Total: 243 mg/dL — ABNORMAL HIGH (ref 100–199)
HDL: 55 mg/dL (ref >39)
LDL Calculated: 167 mg/dL — ABNORMAL HIGH (ref 0–99)
Triglycerides: 107 mg/dL (ref 0–149)
VLDL Cholesterol Cal: 21 mg/dL (ref 5–40)

## 2015-04-09 LAB — TSH: TSH: 2.97 u[IU]/mL (ref 0.450–4.500)

## 2015-04-10 NOTE — Progress Notes (Signed)
Pt aware of results 

## 2015-04-18 ENCOUNTER — Encounter: Payer: Self-pay | Admitting: General Surgery

## 2015-04-18 ENCOUNTER — Ambulatory Visit (INDEPENDENT_AMBULATORY_CARE_PROVIDER_SITE_OTHER): Payer: Medicare Other | Admitting: General Surgery

## 2015-04-18 VITALS — BP 128/72 | HR 72 | Resp 12 | Ht 67.0 in | Wt 145.0 lb

## 2015-04-18 DIAGNOSIS — I6523 Occlusion and stenosis of bilateral carotid arteries: Secondary | ICD-10-CM

## 2015-04-18 DIAGNOSIS — R1031 Right lower quadrant pain: Secondary | ICD-10-CM

## 2015-04-18 DIAGNOSIS — R1030 Lower abdominal pain, unspecified: Secondary | ICD-10-CM

## 2015-04-18 NOTE — Progress Notes (Signed)
Patient ID: Austin Hunt, male   DOB: 1941-05-21, 74 y.o.   MRN: [id number]  Chief Complaint  Patient presents with  . Abdominal Pain    HPI Austin Hunt is a 74 y.o. male here today for a evaluation of right abdominal pain. He states he has had occasional right side abdomen pain (twice a week) since his hernia surgery 05-10-14. He states it last about 5 minutes described as a stinging. No nausea or vomiting. He states the area is sore to touch with firm pressure. Bowels may move once a day then he could go 2-3 days before they move. He does use miralax twice a week. He is not associated any relationship between bowel movements, urination and the groin discomfort. The area can be uncomfortable with or without activity.   HPI  Past Medical History  Diagnosis Date  . Diabetes mellitus without complication   . Glaucoma     Past Surgical History  Procedure Laterality Date  . Appendectomy  1954  . Hernia repair Left 1967  . Hernia repair Right 05-10-14    Right femoral hernia repair Dr Bary Castilla with PerFix plug.   . Eye surgery  2011, 2013    cataracts  . Eye surgery Right May 2016    glaucoma  . Bladder surgery  Oct 2015  . Prostate surgery  Oct 2015  . Colonoscopy      No family history on file.  Social History History  Substance Use Topics  . Smoking status: Never Smoker   . Smokeless tobacco: Never Used  . Alcohol Use: No    Allergies  Allergen Reactions  . Penicillins Itching  . Statins Other (See Comments)    myalgia  . Welchol [Colesevelam Hcl]     Current Outpatient Prescriptions  Medication Sig Dispense Refill  . aspirin 81 MG tablet Take 81 mg by mouth daily.    . brimonidine-timolol (COMBIGAN) 0.2-0.5 % ophthalmic solution Place 1 drop into both eyes every 12 (twelve) hours.    . Cyanocobalamin 1000 MCG TBCR Take 1 tablet by mouth daily.    . Dutasteride-Tamsulosin HCl (JALYN) 0.5-0.4 MG CAPS Take by mouth daily.    . metFORMIN (GLUCOPHAGE) 500 MG tablet  Take 500 mg by mouth 3 (three) times daily. 1 1/2 in the morning and 1 1/2 at bedtime    . Multiple Vitamin (MULTIVITAMIN) tablet Take 1 tablet by mouth daily.    . OMEGA-3 FATTY ACIDS PO Take 1 tablet by mouth daily.    . Polyethylene Glycol 3350 (MIRALAX PO) Take by mouth as needed.    . ramipril (ALTACE) 2.5 MG capsule Take 2.5 mg by mouth daily.    . travoprost, benzalkonium, (TRAVATAN) 0.004 % ophthalmic solution 1 drop at bedtime.    . Zinc 50 MG CAPS Take 1 capsule by mouth daily.     No current facility-administered medications for this visit.    Review of Systems Review of Systems  Constitutional: Negative.   HENT: Negative.   Respiratory: Negative.   Cardiovascular: Negative.   Gastrointestinal: Positive for abdominal pain and constipation. Negative for nausea, vomiting and diarrhea.  Skin: Negative.   Neurological: Negative.     Blood pressure 128/72, pulse 72, resp. rate 12, height 5\' 7"  (1.702 m), weight 145 lb (65.772 kg).  Physical Exam Physical Exam  Constitutional: He is oriented to person, place, and time. He appears well-developed and well-nourished.  Eyes: Conjunctivae are normal. No scleral icterus.  Neck: Neck supple.  Cardiovascular: Normal  rate.   Pulmonary/Chest: Effort normal.  Abdominal: Soft. Normal appearance. There is tenderness. No hernia.    Lymphadenopathy:    He has no cervical adenopathy.  Neurological: He is alert and oriented to person, place, and time.  Skin: Skin is warm and dry.    Data Reviewed Operative report.  Assessment    Right groin pain post femoral hernia repair.    Plan    An onlay mesh was not utilized, and no sutures were placed of the pubic tubercle. A mesh plug was placed into the femoral defect.    Discussed options of observation or cortisone injection. Patient prefers observation at this time. The patient is aware to call back for any new questions or concerns.     PCP:  Mamie Laurel 04/19/2015, 2:39 PM

## 2015-04-18 NOTE — Patient Instructions (Signed)
The patient is aware to call back for any questions or concerns.  

## 2015-04-19 ENCOUNTER — Encounter: Payer: Self-pay | Admitting: General Surgery

## 2015-04-19 DIAGNOSIS — R1031 Right lower quadrant pain: Secondary | ICD-10-CM | POA: Insufficient documentation

## 2015-05-20 ENCOUNTER — Ambulatory Visit: Payer: Medicare Other

## 2015-05-30 ENCOUNTER — Other Ambulatory Visit: Payer: Self-pay | Admitting: Family Medicine

## 2015-06-03 ENCOUNTER — Ambulatory Visit: Payer: Medicare Other

## 2015-06-04 ENCOUNTER — Ambulatory Visit (INDEPENDENT_AMBULATORY_CARE_PROVIDER_SITE_OTHER): Payer: Medicare Other | Admitting: Podiatry

## 2015-06-04 DIAGNOSIS — M79676 Pain in unspecified toe(s): Secondary | ICD-10-CM

## 2015-06-04 DIAGNOSIS — B351 Tinea unguium: Secondary | ICD-10-CM | POA: Diagnosis not present

## 2015-06-04 NOTE — Progress Notes (Signed)
Subjective: 74 y.o. returns the office today for painful, elongated, thickened toenails which he is unable to trim himself. Denies any redness or drainage around the nails. Denies any acute changes since last appointment and no new complaints today. Denies any systemic complaints such as fevers, chills, nausea, vomiting.   Objective: AAO 3, NAD DP/PT pulses palpable, CRT less than 3 seconds Nails hypertrophic, dystrophic, elongated, brittle, discolored 10. There is tenderness overlying the nails 15 bilaterally. There is no surrounding erythema or drainage along the nail sites. No open lesions or pre-ulcerative lesions are identified. No other areas of tenderness bilateral lower extremities. No overlying edema, erythema, increased warmth. No pain with calf compression, swelling, warmth, erythema.  Assessment: Patient presents with symptomatic onychomycosis  Plan: -Treatment options including alternatives, risks, complications were discussed -Nails sharply debrided 10 without complication/bleeding. -Discussed daily foot inspection. If there are any changes, to call the office immediately.  -Follow-up in 3 months or sooner if any problems are to arise. In the meantime, encouraged to call the office with any questions, concerns, changes symptoms.   Celesta Gentile, DPM

## 2015-06-13 DIAGNOSIS — E138 Other specified diabetes mellitus with unspecified complications: Secondary | ICD-10-CM | POA: Diagnosis not present

## 2015-06-13 DIAGNOSIS — I159 Secondary hypertension, unspecified: Secondary | ICD-10-CM | POA: Diagnosis not present

## 2015-06-13 DIAGNOSIS — M5481 Occipital neuralgia: Secondary | ICD-10-CM | POA: Diagnosis not present

## 2015-07-24 DIAGNOSIS — H4011X3 Primary open-angle glaucoma, severe stage: Secondary | ICD-10-CM | POA: Diagnosis not present

## 2015-08-08 ENCOUNTER — Ambulatory Visit: Payer: Medicare Other | Admitting: Family Medicine

## 2015-08-14 ENCOUNTER — Ambulatory Visit (INDEPENDENT_AMBULATORY_CARE_PROVIDER_SITE_OTHER): Payer: Medicare Other | Admitting: Family Medicine

## 2015-08-14 ENCOUNTER — Encounter: Payer: Self-pay | Admitting: Family Medicine

## 2015-08-14 VITALS — BP 122/64 | HR 61 | Temp 98.2°F | Resp 16 | Ht 67.0 in | Wt 142.2 lb

## 2015-08-14 DIAGNOSIS — B350 Tinea barbae and tinea capitis: Secondary | ICD-10-CM | POA: Diagnosis not present

## 2015-08-14 DIAGNOSIS — Z23 Encounter for immunization: Secondary | ICD-10-CM

## 2015-08-14 DIAGNOSIS — I6523 Occlusion and stenosis of bilateral carotid arteries: Secondary | ICD-10-CM | POA: Diagnosis not present

## 2015-08-14 DIAGNOSIS — E785 Hyperlipidemia, unspecified: Secondary | ICD-10-CM | POA: Diagnosis not present

## 2015-08-14 DIAGNOSIS — E1169 Type 2 diabetes mellitus with other specified complication: Secondary | ICD-10-CM | POA: Diagnosis not present

## 2015-08-14 DIAGNOSIS — I10 Essential (primary) hypertension: Secondary | ICD-10-CM | POA: Diagnosis not present

## 2015-08-14 DIAGNOSIS — B354 Tinea corporis: Secondary | ICD-10-CM | POA: Diagnosis not present

## 2015-08-14 DIAGNOSIS — F419 Anxiety disorder, unspecified: Secondary | ICD-10-CM

## 2015-08-14 LAB — POCT GLYCOSYLATED HEMOGLOBIN (HGB A1C): Hemoglobin A1C: 7.5

## 2015-08-14 LAB — GLUCOSE, POCT (MANUAL RESULT ENTRY): POC Glucose: 131 mg/dl — AB (ref 70–99)

## 2015-08-14 MED ORDER — CLOTRIMAZOLE-BETAMETHASONE 1-0.05 % EX CREA: 1.0000 "application " | TOPICAL_CREAM | Freq: Two times a day (BID) | CUTANEOUS | Status: DC

## 2015-08-14 NOTE — Progress Notes (Addendum)
Name: Austin Hunt   MRN: [id number]    DOB: 09-30-1941   Date:08/14/2015       Progress Note  Subjective  Chief Complaint  Chief Complaint  Patient presents with  . Diabetes  . Hyperlipidemia  . Anxiety    HPI  Diabetes  Patient presents for follow-up of diabetes which is present for over 5 years. Is currently on a regimen of metformin 5 mg daily . Patient states good compliance with their diet and exercise. There's been no hypoglycemic episodes and there no episodes of polyuria polydipsia polyphagia. His average fasting glucoses been in the low around 120  with a high around 150 . There is no end organ disease.  Last diabetic eye exam was a few months ago.   Last visit with dietitian was over year ago. Last microalbumin was obtained 100 on 11/20 2016 .   Hypertension   Patient presents for follow-up of hypertension. It has been present for over over 5 years.  Patient states that there is compliance with medical regimen which consists of 2.5 mg daily. . There is no end organ disease. Cardiac risk factors include hypertension hyperlipidemia and diabetes.  Exercise regimen consist of some walking .  Diet consist of low-salt . He is  Hyperlipidemia history of present illness  Patient has a history of hyperlipidemia for over 5  years.  Current medical regimen consist of some herbal medicines as he is not tolerate statins .  Compliance is good .  Diet and exercise are currently followed regularly .  Risk factors for cardiovascular disease include hyperlipidemia , diabetes, age .   There have been no side effects from the medication.   Anxiety history of present illness patien  Anxiety symptoms are much improved. He is not taking any medications currently  Past Medical History  Diagnosis Date  . Diabetes mellitus without complication ([place])   . Glaucoma     Social History  Substance Use Topics  . Smoking status: Never Smoker   . Smokeless tobacco: Never Used  . Alcohol Use: No      Current outpatient prescriptions:  .  ALTACE 2.5 MG capsule, TAKE 1 CAPSULE BY MOUTH ONCE EVERY OTHERDAY., Disp: 45 capsule, Rfl: 2 .  aspirin 81 MG tablet, Take 81 mg by mouth daily., Disp: , Rfl:  .  brimonidine-timolol (COMBIGAN) 0.2-0.5 % ophthalmic solution, Place 1 drop into both eyes every 12 (twelve) hours., Disp: , Rfl:  .  Cyanocobalamin 1000 MCG TBCR, Take 1 tablet by mouth daily., Disp: , Rfl:  .  Dutasteride-Tamsulosin HCl (JALYN) 0.5-0.4 MG CAPS, Take by mouth daily., Disp: , Rfl:  .  Garlic 10 MG CAPS, Take by mouth., Disp: , Rfl:  .  metFORMIN (GLUCOPHAGE) 500 MG tablet, Take 500 mg by mouth 3 (three) times daily. 1 1/2 in the morning and 1 1/2 at bedtime, Disp: , Rfl:  .  Multiple Vitamin (MULTIVITAMIN) tablet, Take 1 tablet by mouth daily., Disp: , Rfl:  .  OMEGA-3 FATTY ACIDS PO, Take 1 tablet by mouth daily., Disp: , Rfl:  .  Polyethylene Glycol 3350 (MIRALAX PO), Take by mouth as needed., Disp: , Rfl:  .  travoprost, benzalkonium, (TRAVATAN) 0.004 % ophthalmic solution, 1 drop at bedtime., Disp: , Rfl:  .  Zinc 50 MG CAPS, Take 1 capsule by mouth daily., Disp: , Rfl:   Allergies  Allergen Reactions  . Penicillins Itching  . Statins Other (See Comments)    myalgia  . Welchol [employer]  Hcl]     Review of Systems  Constitutional: Negative for fever, chills and weight loss.  HENT: Negative for congestion, hearing loss, sore throat and tinnitus.   Eyes: Negative for blurred vision, double vision and redness.  Respiratory: Negative for cough, hemoptysis and shortness of breath.   Cardiovascular: Negative for chest pain, palpitations, orthopnea, claudication and leg swelling.  Gastrointestinal: Negative for heartburn, nausea, vomiting, diarrhea, constipation and blood in stool.  Genitourinary: Negative for dysuria, urgency, frequency and hematuria.  Musculoskeletal: Negative for myalgias, back pain, joint pain, falls and neck pain.  Skin: Negative for itching.   Neurological: Positive for weakness (occasional weakness with standingB indicative of orthostasis). Negative for dizziness, tingling, tremors, focal weakness, seizures, loss of consciousness and headaches.  Endo/Heme/Allergies: Does not bruise/bleed easily.  Psychiatric/Behavioral: Negative for depression and substance abuse. The patient is not nervous/anxious and does not have insomnia.      Objective  Filed Vitals:   08/14/15 0825  BP: 122/64  Pulse: 61  Temp: 98.2 F (36.8 C)  Resp: 16  Height: 5\' 7"  (1.702 m)  Weight: 142 lb 4 oz (64.524 kg)  SpO2: 94%     Physical Exam  Constitutional: He is oriented to person, place, and time and well-developed, well-nourished, and in no distress.  HENT:  Head: Normocephalic.  Eyes: EOM are normal. Pupils are equal, round, and reactive to light.  Neck: Normal range of motion. Neck supple. No thyromegaly present.  Cardiovascular: Normal rate, regular rhythm, normal heart sounds and intact distal pulses.   No murmur heard. Pulmonary/Chest: Effort normal and breath sounds normal. No respiratory distress. He has no wheezes.  Abdominal: Soft. Bowel sounds are normal.  Musculoskeletal: Normal range of motion. He exhibits no edema.  Lymphadenopathy:    He has no cervical adenopathy.  Neurological: He is alert and oriented to person, place, and time. No cranial nerve deficit. Gait normal. Coordination normal.  Skin: Skin is warm and dry. No rash noted.  Psychiatric: Affect and judgment normal.      Assessment & Plan  1. Type 2 diabetes mellitus with other specified complication (HCC) Continue current regimen - POCT Glucose (CBG) - POCT HgB A1C  2. Tinea corporis Treatment as below - clotrimazole-betamethasone (LOTRISONE) cream; Apply 1 application topically 2 (two) times daily.  Dispense: 30 g; Refill: 0  3. Need for influenza vaccination Given - Flu vaccine HIGH DOSE PF (Fluzone High dose)  4. Essential  hypertension Well-controlled  5. Hyperlipidemia Statin intolerant  6. Acute anxiety When necessary alprazolam if needed

## 2015-08-28 NOTE — Addendum Note (Signed)
Addended by: Ashok Norris on: 08/28/2015 01:10 AM   Modules accepted: Miquel Dunn

## 2015-09-04 ENCOUNTER — Ambulatory Visit (INDEPENDENT_AMBULATORY_CARE_PROVIDER_SITE_OTHER): Payer: Medicare Other | Admitting: Podiatry

## 2015-09-04 ENCOUNTER — Encounter: Payer: Self-pay | Admitting: Podiatry

## 2015-09-04 DIAGNOSIS — B351 Tinea unguium: Secondary | ICD-10-CM | POA: Diagnosis not present

## 2015-09-04 DIAGNOSIS — M79676 Pain in unspecified toe(s): Secondary | ICD-10-CM

## 2015-09-04 NOTE — Progress Notes (Signed)
He presents today with chief complaint of painful elongated toenails bilaterally.  Objective: Vital signs are stable he is alert and oriented 3. Pulses are strongly palpable. Nails are thick yellow dystrophic onychomycotic and painful palpation as well as debridement.  Assessment: Pain in limb secondary to onychomycosis.  Plan: Discussed etiology pathology conservative versus surgical therapies. Debrided nails 1 through 5 bilateral. Return to clinic 3 months.  Roselind Messier DPM

## 2015-09-10 ENCOUNTER — Other Ambulatory Visit: Payer: Self-pay | Admitting: Family Medicine

## 2015-09-12 DIAGNOSIS — I209 Angina pectoris, unspecified: Secondary | ICD-10-CM | POA: Diagnosis not present

## 2015-09-12 DIAGNOSIS — E119 Type 2 diabetes mellitus without complications: Secondary | ICD-10-CM | POA: Diagnosis not present

## 2015-09-12 DIAGNOSIS — E784 Other hyperlipidemia: Secondary | ICD-10-CM | POA: Diagnosis not present

## 2015-09-12 DIAGNOSIS — I2510 Atherosclerotic heart disease of native coronary artery without angina pectoris: Secondary | ICD-10-CM | POA: Diagnosis not present

## 2015-09-12 DIAGNOSIS — I10 Essential (primary) hypertension: Secondary | ICD-10-CM | POA: Diagnosis not present

## 2015-09-12 DIAGNOSIS — N400 Enlarged prostate without lower urinary tract symptoms: Secondary | ICD-10-CM | POA: Diagnosis not present

## 2015-09-12 DIAGNOSIS — M1990 Unspecified osteoarthritis, unspecified site: Secondary | ICD-10-CM | POA: Diagnosis not present

## 2015-09-12 DIAGNOSIS — R5383 Other fatigue: Secondary | ICD-10-CM | POA: Diagnosis not present

## 2015-09-12 DIAGNOSIS — R51 Headache: Secondary | ICD-10-CM | POA: Diagnosis not present

## 2015-09-18 ENCOUNTER — Other Ambulatory Visit: Payer: Self-pay | Admitting: Family Medicine

## 2015-10-04 ENCOUNTER — Encounter: Payer: Self-pay | Admitting: Urology

## 2015-10-04 ENCOUNTER — Ambulatory Visit (INDEPENDENT_AMBULATORY_CARE_PROVIDER_SITE_OTHER): Payer: Medicare Other | Admitting: Urology

## 2015-10-04 VITALS — BP 148/75 | HR 72 | Ht 69.0 in | Wt 141.6 lb

## 2015-10-04 DIAGNOSIS — I6523 Occlusion and stenosis of bilateral carotid arteries: Secondary | ICD-10-CM

## 2015-10-04 DIAGNOSIS — C61 Malignant neoplasm of prostate: Secondary | ICD-10-CM

## 2015-10-04 DIAGNOSIS — N138 Other obstructive and reflux uropathy: Secondary | ICD-10-CM

## 2015-10-04 DIAGNOSIS — N401 Enlarged prostate with lower urinary tract symptoms: Secondary | ICD-10-CM

## 2015-10-04 LAB — URINALYSIS, COMPLETE
Bilirubin, UA: NEGATIVE
Ketones, UA: NEGATIVE
Leukocytes, UA: NEGATIVE
Nitrite, UA: NEGATIVE
Protein, UA: NEGATIVE
Specific Gravity, UA: 1.01 (ref 1.005–1.030)
Urobilinogen, Ur: 0.2 mg/dL (ref 0.2–1.0)
pH, UA: 5.5 (ref 5.0–7.5)

## 2015-10-04 LAB — MICROSCOPIC EXAMINATION
Bacteria, UA: NONE SEEN
Renal Epithel, UA: NONE SEEN /hpf

## 2015-10-04 LAB — BLADDER SCAN AMB NON-IMAGING

## 2015-10-04 NOTE — Progress Notes (Signed)
10/04/2015 11:44 AM   Austin Hunt [birth year] [id number]  Referring provider: Ashok Norris, MD [address] [place], [place] [address]  Chief Complaint  Patient presents with  . Prostate Cancer    7month with PSA, PVR, IPSS    HPI:  74 year old male with a history of BPH and incidental prostate cancer. He underwent TURP on 08/27/2014 and senses been able to stop his Flomax and dutasteride. Prior to surgery, he did have a history of elevated postvoid residuals as well as bladder stones.  Pathology at the time of TURP showed incidental small focus of Gleason 3+3 prostate cancer. His PSA at the time of diagnosis was 0.7 on dutasteride.  No voiding complaints today other than occational foul smelling urine.  No associated dysuria or gross hematuria.    He is no longer taking any BPH medications and quite pleased.    PVR today 69.  He returns today for rectal exam/PSA as well as reassessment of his voiding symptoms.       IPSS      10/04/15 1100       International Prostate Symptom Score   How often have you had the sensation of not emptying your bladder? Less than 1 in 5     How often have you had to urinate less than every two hours? Less than half the time     How often have you found you stopped and started again several times when you urinated? Not at All     How often have you found it difficult to postpone urination? Not at All     How often have you had a weak urinary stream? Less than 1 in 5 times     How often have you had to strain to start urination? Not at All     How many times did you typically get up at night to urinate? 1 Time     Total IPSS Score 5     Quality of Life due to urinary symptoms   If you were to spend the rest of your life with your urinary condition just the way it is now how would you feel about that? Pleased         PMH: Past Medical History  Diagnosis Date  . Diabetes mellitus without complication ([place])   . Glaucoma    . BPH (benign prostatic hypertrophy) with urinary obstruction   . Prostate cancer ([place])   . 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 Nocturia   . Balanitis     Surgical History: Past Surgical History  Procedure Laterality Date  . Appendectomy  1954  . Eye surgery  2011, 2013    cataracts  . Eye surgery Right May 2016    glaucoma  . Bladder surgery  Oct 2015  . Prostate surgery  Oct 2015  . Colonoscopy    . Hernia repair Left 1967  . Hernia repair Right 05-10-14    Right femoral hernia repair Dr Bary Castilla with PerFix plug, no onlay mesh..     Home Medications:    Medication List       This list is accurate as of: 10/04/15 11:44 AM.  Always use your most recent med list.               ACCU-CHEK COMPACT PLUS test strip  Generic drug:  glucose blood  USE TO TEST BLOOD SUGAR TWICE DAILY.     ACCU-CHEK SOFTCLIX LANCETS lancets  USE TO TEST BLOOD  SUGAR TWICE DAILY.     ALTACE 2.5 MG capsule  Generic drug:  ramipril  TAKE 1 CAPSULE BY MOUTH ONCE EVERY OTHERDAY.     aspirin 81 MG tablet  Take 81 mg by mouth daily.     clotrimazole-betamethasone cream  Commonly known as:  LOTRISONE  Apply 1 application topically 2 (two) times daily.     COMBIGAN 0.2-0.5 % ophthalmic solution  Generic drug:  brimonidine-timolol  Place 1 drop into both eyes every 12 (twelve) hours.     Cyanocobalamin 1000 MCG Tbcr  Take 1 tablet by mouth daily.     Garlic 10 MG Caps  Take by mouth.     metFORMIN 500 MG 24 hr tablet  Commonly known as:  GLUCOPHAGE-XR  TAKE 1 TABLET BY MOUTH EVERY MORNING AND 2 TABLETS EVERY EVENING.     MIRALAX PO  Take by mouth as needed.     multivitamin tablet  Take 1 tablet by mouth daily.     OMEGA-3 FATTY ACIDS PO  Take 1 tablet by mouth daily.     travoprost (benzalkonium) 0.004 % ophthalmic solution  Commonly known as:  TRAVATAN  1 drop at bedtime.     Zinc 50 MG Caps  Take 1 capsule by mouth daily.        Allergies:  Allergies  Allergen Reactions  .  Penicillins Itching  . Statins Other (See Comments)    myalgia  . Welchol [Colesevelam Hcl]     Family History: Family History  Problem Relation Age of Onset  . Prostate cancer Neg Hx   . Bladder Cancer Neg Hx     Social History:  reports that he has never smoked. He has never used smokeless tobacco. He reports that he does not drink alcohol or use illicit drugs.  ROS: UROLOGY Frequent Urination?: No Hard to postpone urination?: No Burning/pain with urination?: No Get up at night to urinate?: No Leakage of urine?: No Urine stream starts and stops?: No Trouble starting stream?: No Do you have to strain to urinate?: No Blood in urine?: No Urinary tract infection?: No Sexually transmitted disease?: No Injury to kidneys or bladder?: No Painful intercourse?: No Weak stream?: No Erection problems?: No Penile pain?: No  Gastrointestinal Nausea?: No Vomiting?: No Indigestion/heartburn?: No Diarrhea?: No Constipation?: No  Constitutional Fever: No Night sweats?: No Weight loss?: No Fatigue?: No  Skin Skin rash/lesions?: No Itching?: No  Eyes Blurred vision?: No Double vision?: No  Ears/Nose/Throat Sore throat?: No Sinus problems?: No  Hematologic/Lymphatic Swollen glands?: No Easy bruising?: No  Cardiovascular Leg swelling?: No Chest pain?: No  Respiratory Cough?: No Shortness of breath?: No  Endocrine Excessive thirst?: No  Musculoskeletal Back pain?: No Joint pain?: No  Neurological Headaches?: Yes Dizziness?: No  Psychologic Depression?: No Anxiety?: No  Physical Exam: BP 148/75 mmHg  Pulse 72  Ht 5\' 9"  (1.753 m)  Wt 141 lb 9.6 oz (64.229 kg)  BMI 20.90 kg/m2  Constitutional:  Alert and oriented, No acute distress. HEENT: [place] AT, moist mucus membranes.  Trachea midline, no masses. Cardiovascular: No clubbing, cyanosis, or edema. Respiratory: Normal respiratory effort, no increased work of breathing. GI: Abdomen is soft,  nontender, nondistended, no abdominal masses GU: No CVA tenderness.  Rectal exam: Mildly decreased sphincter tone. Normal prostate, nontender, 30 cc gland, no nodules.   Skin: No rashes, bruises or suspicious lesions. Neurologic: Grossly intact, no focal deficits, moving all 4 extremities. Psychiatric: Normal mood and affect.  Laboratory Data: Lab Results  Component Value Date   WBC 5.7 08/28/2014   HGB 13.3 08/28/2014   HCT 41.3 08/28/2014   MCV 90 08/28/2014   PLT 181 08/28/2014    Lab Results  Component Value Date   CREATININE 0.84 04/08/2015    Lab Results  Component Value Date   HGBA1C 7.5 08/14/2015    Urinalysis Urinalysis today shows 3+ glucose, trace blood on dip, otherwise negative. Microscopic exam is completely negative without evidence of WBCs or RBCs.  Pertinent Imaging: n/a  Assessment & Plan:    1. Prostate cancer [facility]) Incidental small focus of Gleason 3+3 prostate cancer for TURP 08/2014, iPSA 0.7 on dutasteride.  Recommend active surveillance given age and pathology.  Rectal exam today remains benign. - PSA today, will call if worrisome  2. BPH with urinary obstruction Symptoms minimal, on no medications. Overall doing very well. - Urinalysis, Complete - BLADDER SCAN AMB NON-IMAGING   Return in about 1 year (around 10/03/2016) for PSA/ DRE.  Hollice Espy, MD  [facility] [address], [place], [place] [address] [phone]

## 2015-10-05 LAB — PSA: Prostate Specific Ag, Serum: 1.1 ng/mL (ref 0.0–4.0)

## 2015-10-08 ENCOUNTER — Telehealth: Payer: Self-pay | Admitting: Urology

## 2015-10-08 NOTE — Telephone Encounter (Signed)
-----   Message from Hollice Espy, MD sent at 10/08/2015  1:18 PM EST ----- PSA 1.1, previously 0.7 but expected rise due to stopping of finasteride. No concern, Plan to follow in 6 months as scheduled.  Please let him know.  Hollice Espy, MD

## 2015-10-08 NOTE — Telephone Encounter (Signed)
Left pt mess to call 

## 2015-10-09 NOTE — Telephone Encounter (Signed)
Austin Hunt spoke to patient and gave him the message re his PSA.  Dana set him up an appt for 6 months to come in for repeat PSA level draw and an appt.

## 2015-10-17 ENCOUNTER — Other Ambulatory Visit: Payer: Self-pay | Admitting: Unknown Physician Specialty

## 2015-10-17 DIAGNOSIS — R221 Localized swelling, mass and lump, neck: Secondary | ICD-10-CM | POA: Diagnosis not present

## 2015-10-17 DIAGNOSIS — M542 Cervicalgia: Secondary | ICD-10-CM

## 2015-10-23 ENCOUNTER — Ambulatory Visit
Admission: RE | Admit: 2015-10-23 | Discharge: 2015-10-23 | Disposition: A | Discharge status: Home / Self Care | Payer: Medicare Other | Source: Ambulatory Visit | Attending: Unknown Physician Specialty | Admitting: Unknown Physician Specialty

## 2015-10-23 DIAGNOSIS — R221 Localized swelling, mass and lump, neck: Secondary | ICD-10-CM | POA: Diagnosis not present

## 2015-10-23 DIAGNOSIS — M542 Cervicalgia: Secondary | ICD-10-CM | POA: Insufficient documentation

## 2015-11-01 DIAGNOSIS — R221 Localized swelling, mass and lump, neck: Secondary | ICD-10-CM | POA: Diagnosis not present

## 2015-11-21 DIAGNOSIS — H401133 Primary open-angle glaucoma, bilateral, severe stage: Secondary | ICD-10-CM | POA: Diagnosis not present

## 2015-11-26 DIAGNOSIS — H401133 Primary open-angle glaucoma, bilateral, severe stage: Secondary | ICD-10-CM | POA: Diagnosis not present

## 2015-12-09 ENCOUNTER — Ambulatory Visit: Payer: Medicare Other | Admitting: Podiatry

## 2015-12-11 ENCOUNTER — Encounter: Payer: Self-pay | Admitting: *Deleted

## 2015-12-11 ENCOUNTER — Ambulatory Visit: Payer: Medicare Other | Admitting: Podiatry

## 2015-12-13 ENCOUNTER — Encounter: Payer: Self-pay | Admitting: Sports Medicine

## 2015-12-13 ENCOUNTER — Ambulatory Visit (INDEPENDENT_AMBULATORY_CARE_PROVIDER_SITE_OTHER): Payer: Medicare Other | Admitting: Sports Medicine

## 2015-12-13 DIAGNOSIS — M79671 Pain in right foot: Secondary | ICD-10-CM

## 2015-12-13 DIAGNOSIS — B353 Tinea pedis: Secondary | ICD-10-CM

## 2015-12-13 DIAGNOSIS — M79672 Pain in left foot: Secondary | ICD-10-CM | POA: Diagnosis not present

## 2015-12-13 DIAGNOSIS — E119 Type 2 diabetes mellitus without complications: Secondary | ICD-10-CM

## 2015-12-13 DIAGNOSIS — B351 Tinea unguium: Secondary | ICD-10-CM

## 2015-12-13 MED ORDER — TERBINAFINE HCL 1 % EX CREA: 1.0000 "application " | TOPICAL_CREAM | Freq: Two times a day (BID) | CUTANEOUS | Status: DC

## 2015-12-13 NOTE — Discharge Instructions (Signed)

## 2015-12-13 NOTE — Progress Notes (Signed)
Patient ID: Austin Hunt, male   DOB: July 16, 1941, 75 y.o.   MRN: [id number] Subjective: Austin Hunt is a 75 y.o. male patient with history of type 2 diabetes who presents to office today complaining of long, painful nails  while ambulating in shoes; unable to trim. Patient states that the glucose reading this morning was 125 mg/dl. Patient denies any new changes in medication or new problems. Patient denies any new cramping, numbness, burning or tingling in the legs.  Patient Active Problem List   Diagnosis Date Noted  . Right groin pain 04/19/2015  . Anxiety 04/08/2015  . 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 Chronic constipation 04/08/2015  . 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 Diabetes mellitus type 2, uncontrolled ([place]) 04/08/2015  . Failure of erection 04/08/2015  . Fatigue 04/08/2015  . H/O inguinal hernia repair 04/08/2015  . HLD (hyperlipidemia) 04/08/2015  . 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 LBP (low back pain) 04/08/2015  . 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 Drug intolerance 04/08/2015  . Kidney lump 04/08/2015  . TI (tricuspid incompetence) 04/08/2015  . Unilateral recurrent femoral hernia without obstruction or gangrene 05/23/2014  . Femoral hernia 05/23/2014  . Right inguinal hernia 02/06/2014  . Cataract 01/16/2014  . Glaucoma 01/16/2014  . Abnormal loss of weight 08/20/2010  . Reflux esophagitis 06/21/2009  . Coronary atherosclerosis 04/03/2009  . Benign enlargement of prostate 02/11/2009  . Decreased libido 01/12/2008  . Benign essential HTN 09/12/2007   Current Outpatient Prescriptions on File Prior to Visit  Medication Sig Dispense Refill  . ACCU-CHEK COMPACT PLUS test strip USE TO TEST BLOOD SUGAR TWICE DAILY. 102 each 11  . ACCU-CHEK SOFTCLIX LANCETS lancets USE TO TEST BLOOD SUGAR TWICE DAILY. 100 each 11  . ALTACE 2.5 MG capsule TAKE 1 CAPSULE BY MOUTH ONCE EVERY OTHERDAY. 45 capsule 2  . aspirin 81 MG tablet Take 81 mg by mouth daily.    .  brimonidine-timolol (COMBIGAN) 0.2-0.5 % ophthalmic solution Place 1 drop into both eyes every 12 (twelve) hours.    . clotrimazole-betamethasone (LOTRISONE) cream Apply 1 application topically 2 (two) times daily. 30 g 0  . Cyanocobalamin 1000 MCG TBCR Take 1 tablet by mouth daily.    . Garlic 10 MG CAPS Take by mouth.    . metFORMIN (GLUCOPHAGE-XR) 500 MG 24 hr tablet TAKE 1 TABLET BY MOUTH EVERY MORNING AND 2 TABLETS EVERY EVENING. 270 tablet 1  . Multiple Vitamin (MULTIVITAMIN) tablet Take 1 tablet by mouth daily.    . nortriptyline (PAMELOR) 10 MG capsule Take 10 mg by mouth at bedtime.    . OMEGA-3 FATTY ACIDS PO Take 1 tablet by mouth daily.    . Polyethylene Glycol 3350 (MIRALAX PO) Take by mouth as needed.    . travoprost, benzalkonium, (TRAVATAN) 0.004 % ophthalmic solution 1 drop at bedtime.    . Zinc 50 MG CAPS Take 1 capsule by mouth daily.     No current facility-administered medications on file prior to visit.   Allergies  Allergen Reactions  . Penicillins Itching  . Statins Other (See Comments)    myalgia  . Welchol [Colesevelam Hcl]     Objective: General: Patient is awake, alert, and oriented x 3 and in no acute distress.  Integument: Skin is warm, dry and supple bilateral. Nails are tender, long, thickened and  dystrophic with subungual debris, consistent with onychomycosis, 1-5 bilateral. No signs of infection. + scaly skin in annular fashion suggestive of tinea. No open lesions or preulcerative  lesions present bilateral. Remaining integument unremarkable.  Vasculature:  Dorsalis Pedis pulse 2/4 bilateral. Posterior Tibial pulse  1/4 bilateral.  Capillary fill time <3 sec 1-5 bilateral. Scant hair growth to the level of the digits. Temperature gradient within normal limits. No varicosities present bilateral. No edema present bilateral.   Neurology: The patient has intact sensation measured with a 5.07/10g Semmes Weinstein Monofilament at all pedal sites bilateral .  Vibratory intact diminished bilateral with tuning fork. No Babinski sign present bilateral.   Musculoskeletal: Mild asymptomatic hammertoe pedal deformities noted bilateral. Muscular strength 5/5 in all lower extremity muscular groups bilateral without pain or limitation on range of motion . No tenderness with calf compression bilateral.  Assessment and Plan: Problem List Items Addressed This Visit    None    Visit Diagnoses    Tinea pedis of both feet    -  Primary    Relevant Medications    terbinafine (LAMISIL AT) 1 % cream    Dermatophytosis of nail        Relevant Medications    terbinafine (LAMISIL AT) 1 % cream    Diabetes mellitus without complication ([facility])        Foot pain, bilateral          -Examined patient. -Discussed and educated patient on diabetic foot care, especially with  regards to the vascular, neurological and musculoskeletal systems.  -Stressed the importance of good glycemic control and the detriment of not  controlling glucose levels in relation to the foot. -Mechanically debrided all nails 1-5 bilateral using sterile nail nipper and filed with dremel without incident  -Rx Lamisil AT cream to use to bottom of both feet daily  -Answered all patient questions -Patient to return as needed or in 3 months for at risk foot care -Patient advised to call the office if any problems or questions arise in the  Meantime.  Landis Martins, DPM

## 2015-12-16 ENCOUNTER — Ambulatory Visit: Payer: Medicare Other | Admitting: Family Medicine

## 2015-12-19 ENCOUNTER — Ambulatory Visit (INDEPENDENT_AMBULATORY_CARE_PROVIDER_SITE_OTHER): Payer: Medicare Other | Admitting: Family Medicine

## 2015-12-19 ENCOUNTER — Ambulatory Visit
Admission: RE | Admit: 2015-12-19 | Discharge: 2015-12-19 | Disposition: A | Discharge status: Home / Self Care | Payer: Medicare Other | Source: Ambulatory Visit | Attending: Family Medicine | Admitting: Family Medicine

## 2015-12-19 ENCOUNTER — Encounter: Payer: Self-pay | Admitting: Family Medicine

## 2015-12-19 VITALS — BP 130/60 | HR 72 | Temp 98.1°F | Resp 14 | Ht 69.0 in | Wt 140.2 lb

## 2015-12-19 DIAGNOSIS — R6883 Chills (without fever): Secondary | ICD-10-CM

## 2015-12-19 DIAGNOSIS — E1165 Type 2 diabetes mellitus with hyperglycemia: Secondary | ICD-10-CM

## 2015-12-19 DIAGNOSIS — R05 Cough: Secondary | ICD-10-CM | POA: Insufficient documentation

## 2015-12-19 DIAGNOSIS — R52 Pain, unspecified: Secondary | ICD-10-CM | POA: Diagnosis not present

## 2015-12-19 DIAGNOSIS — R059 Cough, unspecified: Secondary | ICD-10-CM

## 2015-12-19 DIAGNOSIS — K5909 Other constipation: Secondary | ICD-10-CM

## 2015-12-19 DIAGNOSIS — K5900 Constipation, unspecified: Secondary | ICD-10-CM | POA: Diagnosis not present

## 2015-12-19 DIAGNOSIS — R509 Fever, unspecified: Secondary | ICD-10-CM | POA: Diagnosis not present

## 2015-12-19 DIAGNOSIS — IMO0001 Reserved for inherently not codable concepts without codable children: Secondary | ICD-10-CM

## 2015-12-19 DIAGNOSIS — M2550 Pain in unspecified joint: Secondary | ICD-10-CM | POA: Diagnosis not present

## 2015-12-19 LAB — POCT INFLUENZA A/B
Influenza A, POC: NEGATIVE
Influenza B, POC: NEGATIVE

## 2015-12-19 MED ORDER — LUBIPROSTONE 24 MCG PO CAPS: 24.0000 ug | ORAL_CAPSULE | Freq: Two times a day (BID) | ORAL | Status: DC

## 2015-12-19 NOTE — Patient Instructions (Signed)
Polymyalgia Rheumatica Polymyalgia rheumatica (also called PMR or polymyalgia) is a rheumatologic (arthritic) condition that causes pain and morning stiffness in your neck, shoulders, and hips. It is an inflammatory condition. In some people, inflammation of certain structures in the shoulder, hips, or other joints can be seen on special testing. It does not cause joint destruction, as occurs in other arthritic conditions. It usually occurs after 77 years of age, and is more common as you age. It can be confused with several other diseases, but it is usually easily treated. People with PMR often have, or can develop, a more severe rheumatologic condition called giant cell arteritis (also called CGA or temporal arteritis).  CAUSES  The exact cause of PMR is not known.   There are genetic factors involved.  Viruses have been suspected in the cause of PMR. This has not been proven. SYMPTOMS   Aching, pain, and morning stiffness your neck, both shoulders, or both hips.  Symptoms usually start slowly and build gradually.  Morning stiffness usually lasts at least 30 minutes.  Swelling and tenderness in other joints of the arms, hands, legs, and feet may occur.  Swelling and inflammation in the wrists can cause nerve inflammation at the wrist (carpal tunnel syndrome).  You may also have low grade fever, fatigue, weakness, decreased appetite and weight loss. DIAGNOSIS   Your caregiver may suspect that you have PMR based on your description of your symptoms and on your exam.  Your caregiver will examine you to be sure you do not have diseases that can be confused with PMR. These diseases include rheumatoid arthritis, fibromyalgia, or thyroid disease.  Your caregiver should check for signs of giant cell arteritis. This can cause serious complications such as blindness.  Lab tests can help confirm that you have PMR and not other diseases, but are sometimes inconclusive.  X-rays cannot show PMR.  However, it can identify other diseases like rheumatoid arthritis. Your caregiver may have you see a specialist in arthritis and inflammatory diseases (rheumatologist). TREATMENT  The goal of treatment is relief of symptoms. Treatment does not shorten the course of the illness or prevent complications. With proper treatment, you usually feel better almost right away.   The initial treatment of PMR is usually a cortisone (steroid) medication. Your caregiver will help determine a starting dose. The dose is gradually reduced every few weeks to months. Treatment usually lasts one to three years.  Other stronger medications are rarely needed. They will only be prescribed if your symptoms do not get better on cortisone medication alone, or if they recur as the dose is reduced.  Cortisone medication can have different side effects. With the doses of cortisone needed for PMR, the side effects can affect bones and joints, blood sugar control in diabetes, and mood changes. Discuss this with your caregiver.  Your caregiver will evaluate you regularly during your treatment. They will do this in order to assess progress and to check for complications of the illness or treatment.  Physical therapy is sometimes useful. This is especially true if your joints are still stiff after other symptoms have improved. HOME CARE INSTRUCTIONS   Follow your caregiver's instructions. Do not change your dose of cortisone medication on your own.  Keep your appointments for follow-up lab tests and caregiver visits. Your lab tests need to be monitored. You must get checked periodically for giant cell arteritis.  Follow your caregiver's guidance regarding physical activity (usually no restrictions are needed) or physical therapy.  Your caregiver  may have instructions to prevent or check for side effects from cortisone medication (including bone density testing or treatment). Follow their instructions carefully. SEEK MEDICAL  CARE IF:   You develop any side effects from treatment. Side effects can include:  Elevated blood pressure.  High blood sugar (or worsening of diabetes, if you are diabetic).  Difficulty fighting off infections.  Weight gain.  Weakness of the bones (osteoporosis).  Your aches, pains, morning stiffness, or other symptoms get worse with time. This is especially true after your dose of cortisone is reduced.  You develop new joint symptoms (pain, swelling, etc.) SEEK IMMEDIATE MEDICAL CARE IF:   You develop a severe headache.  You start vomiting.  You have problems with your vision.  You have an oral temperature above 102 F (38.9 C), not controlled by medicine.   This information is not intended to replace advice given to you by your health care provider. Make sure you discuss any questions you have with your health care provider.   Document Released: 11/26/2004 Document Revised: 10/05/2012 Document Reviewed: 05/01/2015 Elsevier Interactive Patient Education [employer].

## 2015-12-19 NOTE — Progress Notes (Signed)
Name: Austin Hunt   MRN: [id number]    DOB: 1940/12/06   Date:12/19/2015       Progress Note  Subjective  Chief Complaint  Chief Complaint  Patient presents with  . Generalized Body Aches  . Constipation    patient states miralax is not working, he only had 1 bowel movement since Sunday    HPI  Body aches and arthralgia: he states that he was feeling fine on Saturday and woke up on Sunday feeling stiff on his hips and with aching sensation on both shoulders, both hips, lower back and both legs feels tired. He had one episode of chills. He lost his appetite but blood sugar has been higher than usual. Fasting used to be in the 120's but now is close to 150's. He denies polyphagia, polydipsia or polyuria.   Constipation: he takes Miralax prn but no longer working, last bowel movement only twice over the past 5 days. No blood in stools, no abdominal pain.   URI:  Clear rhinorrhea, mild sore throat,  cough occasionally, usually a dry cough, occasionally a sputum. Symptoms very mild - he can go one day without coughing. Never a smoker, no history of asthma. One episode of chills, but has noticed severe fatigue, stiffness and body aches.   Patient Active Problem List   Diagnosis Date Noted  . Right groin pain 04/19/2015  . Anxiety 04/08/2015  . 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 Chronic constipation 04/08/2015  . 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 Diabetes mellitus type 2, uncontrolled ([place]) 04/08/2015  . Failure of erection 04/08/2015  . Fatigue 04/08/2015  . H/O inguinal hernia repair 04/08/2015  . HLD (hyperlipidemia) 04/08/2015  . 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 LBP (low back pain) 04/08/2015  . 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 Drug intolerance 04/08/2015  . Kidney lump 04/08/2015  . TI (tricuspid incompetence) 04/08/2015  . Unilateral recurrent femoral hernia without obstruction or gangrene 05/23/2014  . Femoral hernia 05/23/2014  . Right inguinal hernia  02/06/2014  . Cataract 01/16/2014  . Glaucoma 01/16/2014  . Abnormal loss of weight 08/20/2010  . Reflux esophagitis 06/21/2009  . Coronary atherosclerosis 04/03/2009  . Benign enlargement of prostate 02/11/2009  . Decreased libido 01/12/2008  . Benign essential HTN 09/12/2007    Past Surgical History  Procedure Laterality Date  . Appendectomy  1954  . Eye surgery  2011, 2013    cataracts  . Eye surgery Right May 2016    glaucoma  . Bladder surgery  Oct 2015  . Prostate surgery  Oct 2015  . Colonoscopy    . Hernia repair Left 1967  . Hernia repair Right 05-10-14    Right femoral hernia repair Dr Bary Castilla with PerFix plug, no onlay mesh..   . Cardiac catheterization  2002    1 stent    Family History  Problem Relation Age of Onset  . Prostate cancer Neg Hx   . Bladder Cancer Neg Hx     Social History   Social History  . Marital Status: Married    Spouse Name: N/A  . Number of Children: N/A  . Years of Education: N/A   Occupational History  . Not on file.   Social History Main Topics  . Smoking status: Never Smoker   . Smokeless tobacco: Never Used  . Alcohol Use: No  . Drug Use: No  . Sexual Activity: Not on file   Other Topics Concern  . Not on file   Social History  Narrative     Current outpatient prescriptions:  .  ACCU-CHEK COMPACT PLUS test strip, USE TO TEST BLOOD SUGAR TWICE DAILY., Disp: 102 each, Rfl: 11 .  ACCU-CHEK SOFTCLIX LANCETS lancets, USE TO TEST BLOOD SUGAR TWICE DAILY., Disp: 100 each, Rfl: 11 .  ALTACE 2.5 MG capsule, TAKE 1 CAPSULE BY MOUTH ONCE EVERY OTHERDAY., Disp: 45 capsule, Rfl: 2 .  aspirin 81 MG tablet, Take 81 mg by mouth daily., Disp: , Rfl:  .  brimonidine-timolol (COMBIGAN) 0.2-0.5 % ophthalmic solution, Place 1 drop into both eyes every 12 (twelve) hours., Disp: , Rfl:  .  dorzolamide (TRUSOPT) 2 % ophthalmic solution, , Disp: , Rfl:  .  Garlic 10 MG CAPS, Take by mouth., Disp: , Rfl:  .  Garlic 10 MG CAPS, Take by  mouth., Disp: , Rfl:  .  metFORMIN (GLUCOPHAGE-XR) 500 MG 24 hr tablet, TAKE 1 TABLET BY MOUTH EVERY MORNING AND 2 TABLETS EVERY EVENING., Disp: 270 tablet, Rfl: 1 .  Multiple Vitamin (MULTIVITAMIN) tablet, Take 1 tablet by mouth daily., Disp: , Rfl:  .  OMEGA-3 FATTY ACIDS PO, Take 1 tablet by mouth daily., Disp: , Rfl:  .  Polyethylene Glycol 3350 (MIRALAX PO), Take by mouth as needed., Disp: , Rfl:  .  travoprost, benzalkonium, (TRAVATAN) 0.004 % ophthalmic solution, 1 drop at bedtime., Disp: , Rfl:   Allergies  Allergen Reactions  . Penicillins Itching  . Statins Other (See Comments)    myalgia  . Welchol [Colesevelam Hcl]      ROS  Ten systems reviewed and is negative except as mentioned in HPI   Objective  Filed Vitals:   12/19/15 1156  BP: 130/60  Pulse: 72  Temp: 98.1 F (36.7 C)  TempSrc: Oral  Resp: 14  Height: 5\' 9"  (1.753 m)  Weight: 140 lb 3.2 oz (63.594 kg)  SpO2: 98%    Body mass index is 20.69 kg/(m^2).  Physical Exam  Constitutional: Patient appears well-developed and thin  No distress.  HEENT: head atraumatic, normocephalic, pupils equal and reactive to light, ears TM normal , neck supple, throat within normal limits Cardiovascular: Normal rate, regular rhythm and normal heart sounds.  No murmur heard. No BLE edema. Pulmonary/Chest: Effort normal and breath sounds normal. No respiratory distress. Abdominal: Soft.  There is no tenderness. Psychiatric: Patient has a normal mood and affect. behavior is normal. Judgment and thought content normal.  Recent Results (from the past 2160 hour(s))  PSA     Status: None   Collection Time: 10/04/15 11:14 AM  Result Value Ref Range   Prostate Specific Ag, Serum 1.1 0.0 - 4.0 ng/mL    Comment: Roche ECLIA methodology. According to the American Urological Association, Serum PSA should decrease and remain at undetectable levels after radical prostatectomy. The AUA defines biochemical recurrence as an  initial PSA value 0.2 ng/mL or greater followed by a subsequent confirmatory PSA value 0.2 ng/mL or greater. Values obtained with different assay methods or kits cannot be used interchangeably. Results cannot be interpreted as absolute evidence of the presence or absence of malignant disease.   Urinalysis, Complete     Status: Abnormal   Collection Time: 10/04/15 11:18 AM  Result Value Ref Range   Specific Gravity, UA 1.010 1.005 - 1.030   pH, UA 5.5 5.0 - 7.5   Color, UA Yellow Yellow   Appearance Ur Clear Clear   Leukocytes, UA Negative Negative   Protein, UA Negative Negative/Trace   Glucose, UA 3+ (A) Negative  Ketones, UA Negative Negative   RBC, UA Trace (A) Negative   Bilirubin, UA Negative Negative   Urobilinogen, Ur 0.2 0.2 - 1.0 mg/dL   Nitrite, UA Negative Negative   Microscopic Examination See below:   Microscopic Examination     Status: None   Collection Time: 10/04/15 11:18 AM  Result Value Ref Range   WBC, UA 0-5 0 -  5 /hpf   RBC, UA 0-2 0 -  2 /hpf   Epithelial Cells (non renal) 0-10 0 - 10 /hpf   Renal Epithel, UA None seen None seen /hpf   Bacteria, UA None seen None seen/Few  BLADDER SCAN AMB NON-IMAGING     Status: None   Collection Time: 10/04/15 11:27 AM  Result Value Ref Range   Scan Result 10ml      PHQ2/9: Depression screen [facility] 2/9 12/19/2015 08/14/2015 04/08/2015  Decreased Interest 0 0 0  Down, Depressed, Hopeless 0 0 0  PHQ - 2 Score 0 0 0     Fall Risk: Fall Risk  12/19/2015 08/14/2015 04/08/2015  Falls in the past year? No No No     Functional Status Survey: Is the patient deaf or have difficulty hearing?: No Does the patient have difficulty seeing, even when wearing glasses/contacts?: No Does the patient have difficulty concentrating, remembering, or making decisions?: No Does the patient have difficulty walking or climbing stairs?: No Does the patient have difficulty dressing or bathing?: No Does the patient have difficulty doing  errands alone such as visiting a doctor's office or shopping?: No    Assessment & Plan  1. Body aches  He was not a good historian, the facts kept changed throughout the visit, initially did not complain of URI symptoms.  - CBC with Differential/Platelet - C-reactive protein - Sedimentation rate - Comprehensive metabolic panel  2. Chills  resolved  3. Arthralgia  - CBC with Differential/Platelet - C-reactive protein - Sedimentation rate - Comprehensive metabolic panel  4. Chronic constipation  - lubiprostone (AMITIZA) 24 MCG capsule; Take 1 capsule (24 mcg total) by mouth 2 (two) times daily with a meal.  Dispense: 60 capsule; Refill: 2  5. Uncontrolled type 2 diabetes mellitus without complication, without long-term current use of insulin (HCC)  - Hemoglobin A1c  6. Cough  - DG Chest 2 View; Future

## 2015-12-20 ENCOUNTER — Other Ambulatory Visit: Payer: Self-pay | Admitting: Family Medicine

## 2015-12-20 LAB — COMPREHENSIVE METABOLIC PANEL
ALT: 14 IU/L (ref 0–44)
AST: 28 IU/L (ref 0–40)
Albumin/Globulin Ratio: 1.5 (ref 1.1–2.5)
Albumin: 4.4 g/dL (ref 3.5–4.8)
Alkaline Phosphatase: 74 IU/L (ref 39–117)
BUN/Creatinine Ratio: 15 (ref 10–22)
BUN: 14 mg/dL (ref 8–27)
Bilirubin Total: 0.5 mg/dL (ref 0.0–1.2)
CO2: 24 mmol/L (ref 18–29)
Calcium: 9.4 mg/dL (ref 8.6–10.2)
Chloride: 94 mmol/L — ABNORMAL LOW (ref 96–106)
Creatinine, Ser: 0.93 mg/dL (ref 0.76–1.27)
GFR calc Af Amer: 92 mL/min/{1.73_m2} (ref >59)
GFR calc non Af Amer: 79 mL/min/{1.73_m2} (ref >59)
Globulin, Total: 2.9 g/dL (ref 1.5–4.5)
Glucose: 207 mg/dL — ABNORMAL HIGH (ref 65–99)
Potassium: 4.8 mmol/L (ref 3.5–5.2)
Sodium: 135 mmol/L (ref 134–144)
Total Protein: 7.3 g/dL (ref 6.0–8.5)

## 2015-12-20 LAB — CBC WITH DIFFERENTIAL/PLATELET
Basophils Absolute: 0 10*3/uL (ref 0.0–0.2)
Basos: 1 %
EOS (ABSOLUTE): 0 10*3/uL (ref 0.0–0.4)
Eos: 1 %
Hematocrit: 43.8 % (ref 37.5–51.0)
Hemoglobin: 14.7 g/dL (ref 12.6–17.7)
Immature Grans (Abs): 0 10*3/uL (ref 0.0–0.1)
Immature Granulocytes: 1 %
Lymphocytes Absolute: 1.2 10*3/uL (ref 0.7–3.1)
Lymphs: 55 %
MCH: 29.3 pg (ref 26.6–33.0)
MCHC: 33.6 g/dL (ref 31.5–35.7)
MCV: 87 fL (ref 79–97)
Monocytes Absolute: 0.3 10*3/uL (ref 0.1–0.9)
Monocytes: 11 %
Neutrophils Absolute: 0.7 10*3/uL — ABNORMAL LOW (ref 1.4–7.0)
Neutrophils: 31 %
Platelets: 197 10*3/uL (ref 150–379)
RBC: 5.01 x10E6/uL (ref 4.14–5.80)
RDW: 12.3 % (ref 12.3–15.4)
WBC: 2.2 10*3/uL — CL (ref 3.4–10.8)

## 2015-12-20 LAB — SEDIMENTATION RATE: Sed Rate: 19 mm/hr (ref 0–30)

## 2015-12-20 LAB — HEMOGLOBIN A1C
Est. average glucose Bld gHb Est-mCnc: 186 mg/dL
Hgb A1c MFr Bld: 8.1 % — ABNORMAL HIGH (ref 4.8–5.6)

## 2015-12-20 LAB — C-REACTIVE PROTEIN: CRP: 45.5 mg/L — ABNORMAL HIGH (ref 0.0–4.9)

## 2015-12-20 MED ORDER — PREDNISONE 10 MG PO TABS: 10.0000 mg | ORAL_TABLET | Freq: Three times a day (TID) | ORAL | Status: DC

## 2015-12-24 ENCOUNTER — Ambulatory Visit (INDEPENDENT_AMBULATORY_CARE_PROVIDER_SITE_OTHER): Payer: Medicare Other | Admitting: Family Medicine

## 2015-12-24 ENCOUNTER — Ambulatory Visit: Payer: Medicare Other | Admitting: Family Medicine

## 2015-12-24 ENCOUNTER — Encounter: Payer: Self-pay | Admitting: Family Medicine

## 2015-12-24 VITALS — BP 128/60 | HR 63 | Temp 97.5°F | Resp 12 | Wt 136.2 lb

## 2015-12-24 DIAGNOSIS — D72819 Decreased white blood cell count, unspecified: Secondary | ICD-10-CM

## 2015-12-24 DIAGNOSIS — M353 Polymyalgia rheumatica: Secondary | ICD-10-CM | POA: Diagnosis not present

## 2015-12-24 NOTE — Progress Notes (Signed)
Name: Austin Hunt   MRN: [id number]    DOB: 31-Mar-1941   Date:12/24/2015       Progress Note  Subjective  Chief Complaint  Chief Complaint  Patient presents with  . Abnormal Lab    patient is here for a 1 week f/u and a review of labs    HPI  Body aches and arthralgia: he states that he was feeling fine on Saturday Feb 11th  and woke up on Sunday the 12 th feeling stiff on his hips and with aching sensation on both shoulders, both hips, lower back and both legs were feeling  Tired. He lost his appetite but blood sugar was higher than usual. Fasting used to be in the 120's but went up to the 150's. He denies polyphagia, polydipsia or polyuria. He was started on prednisone on 12/20/2015 and is feeling 80 % better, aching sensation has decrease significantly. Glucose has been stable - not going above 150's. Still losing weight. Four lbs since last visit 5 days ago.   Constipation:He has been taking Amitiza now and it worked too well for him, he will take it prn now. No abdominal pain.   URI:he is feeling well now, mild rhinorrhea now, no longer has a sore throat, no chills, very mild cough. No SOB.   Leukopenia: WBC was low , but now on prednisone we will hold off on rechecking at this time since it will go up on Prednisone.    Patient Active Problem List   Diagnosis Date Noted  . Leukopenia 12/24/2015  . Polymyalgia rheumatica ([place]) 12/24/2015  . Right groin pain 04/19/2015  . Anxiety 04/08/2015  . 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 Chronic constipation 04/08/2015  . 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 Diabetes mellitus type 2, uncontrolled, without complications ([place]) 123456  . Failure of erection 04/08/2015  . Fatigue 04/08/2015  . H/O inguinal hernia repair 04/08/2015  . HLD (hyperlipidemia) 04/08/2015  . 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 LBP (low back pain) 04/08/2015  . 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 Drug intolerance 04/08/2015  . Kidney lump  04/08/2015  . TI (tricuspid incompetence) 04/08/2015  . Unilateral recurrent femoral hernia without obstruction or gangrene 05/23/2014  . Femoral hernia 05/23/2014  . Right inguinal hernia 02/06/2014  . Cataract 01/16/2014  . Glaucoma 01/16/2014  . Abnormal loss of weight 08/20/2010  . Reflux esophagitis 06/21/2009  . Coronary atherosclerosis 04/03/2009  . Benign enlargement of prostate 02/11/2009  . Decreased libido 01/12/2008  . Benign essential HTN 09/12/2007    Past Surgical History  Procedure Laterality Date  . Appendectomy  1954  . Eye surgery  2011, 2013    cataracts  . Eye surgery Right May 2016    glaucoma  . Bladder surgery  Oct 2015  . Prostate surgery  Oct 2015  . Colonoscopy    . Hernia repair Left 1967  . Hernia repair Right 05-10-14    Right femoral hernia repair Dr Bary Castilla with PerFix plug, no onlay mesh..   . Cardiac catheterization  2002    1 stent    Family History  Problem Relation Age of Onset  . Prostate cancer Neg Hx   . Bladder Cancer Neg Hx     Social History   Social History  . Marital Status: Married    Spouse Name: N/A  . Number of Children: N/A  . Years of Education: N/A   Occupational History  . Not on file.   Social History Main Topics  .  Smoking status: Never Smoker   . Smokeless tobacco: Never Used  . Alcohol Use: No  . Drug Use: No  . Sexual Activity: Not on file   Other Topics Concern  . Not on file   Social History Narrative     Current outpatient prescriptions:  .  ACCU-CHEK COMPACT PLUS test strip, USE TO TEST BLOOD SUGAR TWICE DAILY., Disp: 102 each, Rfl: 11 .  ACCU-CHEK SOFTCLIX LANCETS lancets, USE TO TEST BLOOD SUGAR TWICE DAILY., Disp: 100 each, Rfl: 11 .  ALTACE 2.5 MG capsule, TAKE 1 CAPSULE BY MOUTH ONCE EVERY OTHERDAY., Disp: 45 capsule, Rfl: 2 .  aspirin 81 MG tablet, Take 81 mg by mouth daily., Disp: , Rfl:  .  brimonidine-timolol (COMBIGAN) 0.2-0.5 % ophthalmic solution, Place 1 drop into both eyes  every 12 (twelve) hours., Disp: , Rfl:  .  dorzolamide (TRUSOPT) 2 % ophthalmic solution, , Disp: , Rfl:  .  Garlic 10 MG CAPS, Take by mouth., Disp: , Rfl:  .  lubiprostone (AMITIZA) 24 MCG capsule, Take 1 capsule (24 mcg total) by mouth 2 (two) times daily with a meal., Disp: 60 capsule, Rfl: 2 .  metFORMIN (GLUCOPHAGE-XR) 500 MG 24 hr tablet, TAKE 1 TABLET BY MOUTH EVERY MORNING AND 2 TABLETS EVERY EVENING., Disp: 270 tablet, Rfl: 1 .  Multiple Vitamin (MULTIVITAMIN) tablet, Take 1 tablet by mouth daily., Disp: , Rfl:  .  OMEGA-3 FATTY ACIDS PO, Take 1 tablet by mouth daily., Disp: , Rfl:  .  Polyethylene Glycol 3350 (MIRALAX PO), Take by mouth as needed., Disp: , Rfl:  .  predniSONE (DELTASONE) 10 MG tablet, Take 1 tablet (10 mg total) by mouth 3 (three) times daily. For 7 days after that try going down to one daily, Disp: 30 tablet, Rfl: 0 .  travoprost, benzalkonium, (TRAVATAN) 0.004 % ophthalmic solution, 1 drop at bedtime., Disp: , Rfl:   Allergies  Allergen Reactions  . Penicillins Itching  . Statins Other (See Comments)    myalgia  . Welchol [Colesevelam Hcl]      ROS   Ten systems reviewed and is negative except as mentioned in HPI   Objective  Filed Vitals:   12/24/15 1001  BP: 128/60  Pulse: 63  Temp: 97.5 F (36.4 C)  TempSrc: Oral  Resp: 12  Weight: 136 lb 3.2 oz (61.78 kg)  SpO2: 98%    Body mass index is 20.1 kg/(m^2).  Physical Exam  Constitutional: Patient appears well-developed and thin No distress.  HEENT: head atraumatic, normocephalic, pupils equal and reactive to light, ears TM normal , neck supple, throat within normal limits Cardiovascular: Normal rate, regular rhythm and normal heart sounds. No murmur heard. No BLE edema. Pulmonary/Chest: Effort normal and breath sounds normal. No respiratory distress. Abdominal: Soft. There is no tenderness. Psychiatric: Patient has a normal mood and affect. behavior is normal. Judgment and thought  content normal. Muscular Skeletal: no pain during palpation of joints, no synovitis, normal rom, mild pain during palpation of lumbar spine.    Recent Results (from the past 2160 hour(s))  PSA     Status: None   Collection Time: 10/04/15 11:14 AM  Result Value Ref Range   Prostate Specific Ag, Serum 1.1 0.0 - 4.0 ng/mL    Comment: Roche ECLIA methodology. According to the American Urological Association, Serum PSA should decrease and remain at undetectable levels after radical prostatectomy. The AUA defines biochemical recurrence as an initial PSA value 0.2 ng/mL or greater followed by a subsequent  confirmatory PSA value 0.2 ng/mL or greater. Values obtained with different assay methods or kits cannot be used interchangeably. Results cannot be interpreted as absolute evidence of the presence or absence of malignant disease.   Urinalysis, Complete     Status: Abnormal   Collection Time: 10/04/15 11:18 AM  Result Value Ref Range   Specific Gravity, UA 1.010 1.005 - 1.030   pH, UA 5.5 5.0 - 7.5   Color, UA Yellow Yellow   Appearance Ur Clear Clear   Leukocytes, UA Negative Negative   Protein, UA Negative Negative/Trace   Glucose, UA 3+ (A) Negative   Ketones, UA Negative Negative   RBC, UA Trace (A) Negative   Bilirubin, UA Negative Negative   Urobilinogen, Ur 0.2 0.2 - 1.0 mg/dL   Nitrite, UA Negative Negative   Microscopic Examination See below:   Microscopic Examination     Status: None   Collection Time: 10/04/15 11:18 AM  Result Value Ref Range   WBC, UA 0-5 0 -  5 /hpf   RBC, UA 0-2 0 -  2 /hpf   Epithelial Cells (non renal) 0-10 0 - 10 /hpf   Renal Epithel, UA None seen None seen /hpf   Bacteria, UA None seen None seen/Few  BLADDER SCAN AMB NON-IMAGING     Status: None   Collection Time: 10/04/15 11:27 AM  Result Value Ref Range   Scan Result 59ml   POCT Influenza A/B     Status: Normal   Collection Time: 12/19/15 12:54 PM  Result Value Ref Range   Influenza A,  POC Negative Negative   Influenza B, POC Negative Negative  CBC with Differential/Platelet     Status: Abnormal   Collection Time: 12/19/15  2:20 PM  Result Value Ref Range   WBC 2.2 (LL) 3.4 - 10.8 x10E3/uL   RBC 5.01 4.14 - 5.80 x10E6/uL   Hemoglobin 14.7 12.6 - 17.7 g/dL   Hematocrit 43.8 37.5 - 51.0 %   MCV 87 79 - 97 fL   MCH 29.3 26.6 - 33.0 pg   MCHC 33.6 31.5 - 35.7 g/dL   RDW 12.3 12.3 - 15.4 %   Platelets 197 150 - 379 x10E3/uL   Neutrophils 31 %   Lymphs 55 %   Monocytes 11 %   Eos 1 %   Basos 1 %   Neutrophils Absolute 0.7 (L) 1.4 - 7.0 x10E3/uL   Lymphocytes Absolute 1.2 0.7 - 3.1 x10E3/uL   Monocytes Absolute 0.3 0.1 - 0.9 x10E3/uL   EOS (ABSOLUTE) 0.0 0.0 - 0.4 x10E3/uL   Basophils Absolute 0.0 0.0 - 0.2 x10E3/uL   Immature Granulocytes 1 %   Immature Grans (Abs) 0.0 0.0 - 0.1 x10E3/uL   Hematology Comments: Note:     Comment: Verified by microscopic examination.  C-reactive protein     Status: Abnormal   Collection Time: 12/19/15  2:20 PM  Result Value Ref Range   CRP 45.5 (H) 0.0 - 4.9 mg/L  Sedimentation rate     Status: None   Collection Time: 12/19/15  2:20 PM  Result Value Ref Range   Sed Rate 19 0 - 30 mm/hr  Comprehensive metabolic panel     Status: Abnormal   Collection Time: 12/19/15  2:20 PM  Result Value Ref Range   Glucose 207 (H) 65 - 99 mg/dL   BUN 14 8 - 27 mg/dL   Creatinine, Ser 0.93 0.76 - 1.27 mg/dL   GFR calc non Af Amer 79 >59 mL/min/1.73  GFR calc Af Amer 92 >59 mL/min/1.73   BUN/Creatinine Ratio 15 10 - 22   Sodium 135 134 - 144 mmol/L   Potassium 4.8 3.5 - 5.2 mmol/L   Chloride 94 (L) 96 - 106 mmol/L   CO2 24 18 - 29 mmol/L   Calcium 9.4 8.6 - 10.2 mg/dL   Total Protein 7.3 6.0 - 8.5 g/dL   Albumin 4.4 3.5 - 4.8 g/dL   Globulin, Total 2.9 1.5 - 4.5 g/dL   Albumin/Globulin Ratio 1.5 1.1 - 2.5    Comment: **Effective January 13, 2016 the reference interval**   for A/G Ratio will be changing to:              Age                 Male          Male           0 -  7 days       1.1 - 2.3       1.1 - 2.3           8 - 30 days       1.2 - 2.8       1.2 - 2.8           1 -  6 months     1.3 - 3.6       1.3 - 3.6    7 months -  5 years      1.5 - 2.6       1.5 - 2.6              > 5 years      1.2 - 2.2       1.2 - 2.2    Bilirubin Total 0.5 0.0 - 1.2 mg/dL   Alkaline Phosphatase 74 39 - 117 IU/L   AST 28 0 - 40 IU/L   ALT 14 0 - 44 IU/L  Hemoglobin A1c     Status: Abnormal   Collection Time: 12/19/15  2:20 PM  Result Value Ref Range   Hgb A1c MFr Bld 8.1 (H) 4.8 - 5.6 %    Comment:          Pre-diabetes: 5.7 - 6.4          Diabetes: >6.4          Glycemic control for adults with diabetes: <7.0    Est. average glucose Bld gHb Est-mCnc 186 mg/dL      PHQ2/9: Depression screen [facility] 2/9 12/24/2015 12/19/2015 08/14/2015 04/08/2015  Decreased Interest 0 0 0 0  Down, Depressed, Hopeless 0 0 0 0  PHQ - 2 Score 0 0 0 0     Fall Risk: Fall Risk  12/24/2015 12/19/2015 08/14/2015 04/08/2015  Falls in the past year? No No No No     Functional Status Survey: Is the patient deaf or have difficulty hearing?: No Does the patient have difficulty seeing, even when wearing glasses/contacts?: No Does the patient have difficulty concentrating, remembering, or making decisions?: No Does the patient have difficulty walking or climbing stairs?: No Does the patient have difficulty dressing or bathing?: No Does the patient have difficulty doing errands alone such as visiting a doctor's office or shopping?: No    Assessment & Plan  1. Polymyalgia rheumatica ([place])  - Ambulatory referral to Rheumatology, likely the cause of his symptoms - we will continue prednisone. Explained he will like be on medication  for the next 6 months. Discussed risk of long term use of prednisone and importance of following up with Rheumatologist He is supposed to have glaucoma repair surgery on Friday, but advised him to discuss with ophthalmologist  that he is on Prednisone  2. Leukopenia  Recheck later.

## 2015-12-24 NOTE — Patient Instructions (Signed)
Polymyalgia Rheumatica Polymyalgia rheumatica (also called PMR or polymyalgia) is a rheumatologic (arthritic) condition that causes pain and morning stiffness in your neck, shoulders, and hips. It is an inflammatory condition. In some people, inflammation of certain structures in the shoulder, hips, or other joints can be seen on special testing. It does not cause joint destruction, as occurs in other arthritic conditions. It usually occurs after 77 years of age, and is more common as you age. It can be confused with several other diseases, but it is usually easily treated. People with PMR often have, or can develop, a more severe rheumatologic condition called giant cell arteritis (also called CGA or temporal arteritis).  CAUSES  The exact cause of PMR is not known.   There are genetic factors involved.  Viruses have been suspected in the cause of PMR. This has not been proven. SYMPTOMS   Aching, pain, and morning stiffness your neck, both shoulders, or both hips.  Symptoms usually start slowly and build gradually.  Morning stiffness usually lasts at least 30 minutes.  Swelling and tenderness in other joints of the arms, hands, legs, and feet may occur.  Swelling and inflammation in the wrists can cause nerve inflammation at the wrist (carpal tunnel syndrome).  You may also have low grade fever, fatigue, weakness, decreased appetite and weight loss. DIAGNOSIS   Your caregiver may suspect that you have PMR based on your description of your symptoms and on your exam.  Your caregiver will examine you to be sure you do not have diseases that can be confused with PMR. These diseases include rheumatoid arthritis, fibromyalgia, or thyroid disease.  Your caregiver should check for signs of giant cell arteritis. This can cause serious complications such as blindness.  Lab tests can help confirm that you have PMR and not other diseases, but are sometimes inconclusive.  X-rays cannot show PMR.  However, it can identify other diseases like rheumatoid arthritis. Your caregiver may have you see a specialist in arthritis and inflammatory diseases (rheumatologist). TREATMENT  The goal of treatment is relief of symptoms. Treatment does not shorten the course of the illness or prevent complications. With proper treatment, you usually feel better almost right away.   The initial treatment of PMR is usually a cortisone (steroid) medication. Your caregiver will help determine a starting dose. The dose is gradually reduced every few weeks to months. Treatment usually lasts one to three years.  Other stronger medications are rarely needed. They will only be prescribed if your symptoms do not get better on cortisone medication alone, or if they recur as the dose is reduced.  Cortisone medication can have different side effects. With the doses of cortisone needed for PMR, the side effects can affect bones and joints, blood sugar control in diabetes, and mood changes. Discuss this with your caregiver.  Your caregiver will evaluate you regularly during your treatment. They will do this in order to assess progress and to check for complications of the illness or treatment.  Physical therapy is sometimes useful. This is especially true if your joints are still stiff after other symptoms have improved. HOME CARE INSTRUCTIONS   Follow your caregiver's instructions. Do not change your dose of cortisone medication on your own.  Keep your appointments for follow-up lab tests and caregiver visits. Your lab tests need to be monitored. You must get checked periodically for giant cell arteritis.  Follow your caregiver's guidance regarding physical activity (usually no restrictions are needed) or physical therapy.  Your caregiver  may have instructions to prevent or check for side effects from cortisone medication (including bone density testing or treatment). Follow their instructions carefully. SEEK MEDICAL  CARE IF:   You develop any side effects from treatment. Side effects can include:  Elevated blood pressure.  High blood sugar (or worsening of diabetes, if you are diabetic).  Difficulty fighting off infections.  Weight gain.  Weakness of the bones (osteoporosis).  Your aches, pains, morning stiffness, or other symptoms get worse with time. This is especially true after your dose of cortisone is reduced.  You develop new joint symptoms (pain, swelling, etc.) SEEK IMMEDIATE MEDICAL CARE IF:   You develop a severe headache.  You start vomiting.  You have problems with your vision.  You have an oral temperature above 102 F (38.9 C), not controlled by medicine.   This information is not intended to replace advice given to you by your health care provider. Make sure you discuss any questions you have with your health care provider.   Document Released: 11/26/2004 Document Revised: 10/05/2012 Document Reviewed: 05/01/2015 Elsevier Interactive Patient Education [employer].

## 2015-12-25 ENCOUNTER — Telehealth: Payer: Self-pay | Admitting: Family Medicine

## 2015-12-25 NOTE — Telephone Encounter (Signed)
Patient is requesting a refill on Accu-Check Plus test strips to be sent to [facility]. Please contact patient once complete.

## 2015-12-30 ENCOUNTER — Ambulatory Visit
Admission: RE | Admit: 2015-12-30 | Discharge: 2015-12-30 | Disposition: A | Discharge status: Home / Self Care | Payer: Medicare Other | Source: Ambulatory Visit | Attending: Ophthalmology | Admitting: Ophthalmology

## 2015-12-30 ENCOUNTER — Encounter: Payer: Self-pay | Admitting: Student in an Organized Health Care Education/Training Program

## 2015-12-30 ENCOUNTER — Encounter
Admission: RE | Disposition: A | Discharge status: Home / Self Care | Payer: Self-pay | Source: Ambulatory Visit | Attending: Ophthalmology

## 2015-12-30 DIAGNOSIS — Z9889 Other specified postprocedural states: Secondary | ICD-10-CM | POA: Insufficient documentation

## 2015-12-30 DIAGNOSIS — Z79899 Other long term (current) drug therapy: Secondary | ICD-10-CM | POA: Diagnosis not present

## 2015-12-30 DIAGNOSIS — Z9049 Acquired absence of other specified parts of digestive tract: Secondary | ICD-10-CM | POA: Insufficient documentation

## 2015-12-30 DIAGNOSIS — H401113 Primary open-angle glaucoma, right eye, severe stage: Secondary | ICD-10-CM | POA: Diagnosis not present

## 2015-12-30 DIAGNOSIS — Z880 Allergy status to penicillin: Secondary | ICD-10-CM | POA: Diagnosis not present

## 2015-12-30 DIAGNOSIS — Z95818 Presence of other cardiac implants and grafts: Secondary | ICD-10-CM | POA: Insufficient documentation

## 2015-12-30 DIAGNOSIS — Z881 Allergy status to other antibiotic agents status: Secondary | ICD-10-CM | POA: Insufficient documentation

## 2015-12-30 DIAGNOSIS — Z888 Allergy status to other drugs, medicaments and biological substances status: Secondary | ICD-10-CM | POA: Diagnosis not present

## 2015-12-30 DIAGNOSIS — Z87442 Personal history of urinary calculi: Secondary | ICD-10-CM | POA: Diagnosis not present

## 2015-12-30 DIAGNOSIS — M1990 Unspecified osteoarthritis, unspecified site: Secondary | ICD-10-CM | POA: Diagnosis not present

## 2015-12-30 DIAGNOSIS — Z7982 Long term (current) use of aspirin: Secondary | ICD-10-CM | POA: Insufficient documentation

## 2015-12-30 DIAGNOSIS — H401133 Primary open-angle glaucoma, bilateral, severe stage: Secondary | ICD-10-CM | POA: Diagnosis not present

## 2015-12-30 DIAGNOSIS — N400 Enlarged prostate without lower urinary tract symptoms: Secondary | ICD-10-CM | POA: Diagnosis not present

## 2015-12-30 HISTORY — PX: PHOTOCOAGULATION WITH LASER: SHX6027

## 2015-12-30 HISTORY — DX: Unspecified osteoarthritis, unspecified site: M19.90

## 2015-12-30 HISTORY — DX: Headache, unspecified: R51.9

## 2015-12-30 HISTORY — DX: Headache: R51

## 2015-12-30 LAB — GLUCOSE, CAPILLARY
Glucose-Capillary: 118 mg/dL — ABNORMAL HIGH (ref 65–99)
Glucose-Capillary: 107 mg/dL — ABNORMAL HIGH (ref 65–99)

## 2015-12-30 SURGERY — PHOTOCOAGULATION, EYE, USING LASER
Anesthesia: Monitor Anesthesia Care | Laterality: Right | Wound class: Clean

## 2015-12-30 MED ORDER — LACTATED RINGERS IV SOLN: INTRAVENOUS | Status: DC

## 2015-12-30 MED ORDER — ACETAMINOPHEN 160 MG/5ML PO SOLN: 325.0000 mg | ORAL | Status: DC | PRN

## 2015-12-30 MED ORDER — MIDAZOLAM HCL 2 MG/2ML IJ SOLN
INTRAMUSCULAR | Status: DC | PRN
  Administered 2015-12-30: 1 mg via INTRAVENOUS

## 2015-12-30 MED ORDER — ACETAMINOPHEN 325 MG PO TABS: 325.0000 mg | ORAL_TABLET | ORAL | Status: DC | PRN

## 2015-12-30 MED ORDER — ERYTHROMYCIN 5 MG/GM OP OINT
TOPICAL_OINTMENT | OPHTHALMIC | Status: DC | PRN
  Administered 2015-12-30: 1 via OPHTHALMIC

## 2015-12-30 MED ORDER — LIDOCAINE HCL 2 % IJ SOLN
INTRAMUSCULAR | Status: DC | PRN
  Administered 2015-12-30: 4 mL via OPHTHALMIC

## 2015-12-30 MED ORDER — ALFENTANIL 500 MCG/ML IJ INJ
INJECTION | INTRAMUSCULAR | Status: DC | PRN
  Administered 2015-12-30: 500 ug via INTRAVENOUS

## 2015-12-30 MED ORDER — TETRACAINE HCL 0.5 % OP SOLN
OPHTHALMIC | Status: DC | PRN
  Administered 2015-12-30: 4 [drp] via OPHTHALMIC

## 2015-12-30 SURGICAL SUPPLY — 11 items
BANDAGE EYE OVAL (MISCELLANEOUS) ×4 IMPLANT
DEVICE G-PROBE SGL USE (Laser) IMPLANT
DEVICE MICRO PULS P3 SGL USE (Laser) ×2 IMPLANT
G-PROBE SGL USE (Laser)
GAUZE SPONGE 4X4 12PLY STRL (GAUZE/BANDAGES/DRESSINGS) ×2 IMPLANT
NDL RETROBULBAR .5 NSTRL (NEEDLE) ×2 IMPLANT
NEEDLE FILTER BLUNT 18X 1/2SAF (NEEDLE) ×1
NEEDLE FILTER BLUNT 18X1 1/2 (NEEDLE) ×1 IMPLANT
SYRINGE 10CC LL (SYRINGE) ×2 IMPLANT
WATER STERILE IRR 250ML POUR (IV SOLUTION) ×2 IMPLANT
WATER STERILE IRR 500ML POUR (IV SOLUTION) IMPLANT

## 2015-12-30 NOTE — Anesthesia Postprocedure Evaluation (Signed)
Anesthesia Post Note  Patient: Austin Hunt  Procedure(s) Performed: Procedure(s) (LRB): PHOTOCOAGULATION WITH LASER (Right)  Patient location during evaluation: PACU Anesthesia Type: MAC Level of consciousness: awake and alert and oriented Pain management: satisfactory to patient Vital Signs Assessment: post-procedure vital signs reviewed and stable Respiratory status: spontaneous breathing, nonlabored ventilation and respiratory function stable Cardiovascular status: blood pressure returned to baseline and stable Postop Assessment: Adequate PO intake and No signs of nausea or vomiting Anesthetic complications: no    Raliegh Ip

## 2015-12-30 NOTE — Op Note (Signed)
DATE OF SURGERY: 12/30/2015  PREOPERATIVE DIAGNOSES: Severe stage primary open angle glaucoma, right eye  POSTOPERATIVE DIAGNOSES: Same  PROCEDURES PERFORMED: Transscleral diode cyclophotocoagulation, right eye  SURGEON: Almon Hercules, M.D.  ANESTHESIA: MAC, retrobulbar  COMPLICATIONS: None.  INDICATIONS FOR PROCEDURE: Austin Hunt is a 75 y.o. year-old male with uncontrolled primary open angle glaucoma. The risks and benefits of glaucoma surgery were discussed with the patient, and he consented for a diode laser surgery.  PROCEDURE IN DETAIL: The eye for surgery was verified during the time-out procedure in the operating room. A retrobulbar block of lidocaine and Marcaine was done for anesthesia. A micropulse probe was applied to each hemilimbus with the following settings: 2060mW, 31.3% duty cycle, 80 seconds superior limbus and 160 seconds inferior limbus.  The eye was pressure patched closed. The patient tolerated the procedure well and was transferred to the Post-operative Care Unit in stable condition.

## 2015-12-30 NOTE — Transfer of Care (Signed)
Immediate Anesthesia Transfer of Care Note  Patient: Austin Hunt  Procedure(s) Performed: Procedure(s) with comments: PHOTOCOAGULATION WITH LASER (Right) - DIABETIC - oral meds IVA BLOCK  Patient Location: PACU  Anesthesia Type: MAC  Level of Consciousness: awake, alert  and patient cooperative  Airway and Oxygen Therapy: Patient Spontanous Breathing and Patient connected to supplemental oxygen  Post-op Assessment: Post-op Vital signs reviewed, Patient's Cardiovascular Status Stable, Respiratory Function Stable, Patent Airway and No signs of Nausea or vomiting  Post-op Vital Signs: Reviewed and stable  Complications: No apparent anesthesia complications

## 2015-12-30 NOTE — Telephone Encounter (Signed)
Patient has accu chel script on file for 1 year

## 2015-12-30 NOTE — H&P (Signed)
H+P reviewed and is up to date, please see paper chart.  

## 2015-12-30 NOTE — OR Nursing (Signed)
A micropulse probe was applied to each hemilimbus with the following settings: 2078mW, 31.3% duty cycle, 80 seconds superior limbus and 160 seconds inferior limbus.

## 2015-12-30 NOTE — Anesthesia Procedure Notes (Signed)
Procedure Name: MAC Performed by: Duru Reiger Pre-anesthesia Checklist: Patient identified, Emergency Drugs available, Suction available, Timeout performed and Patient being monitored Patient Re-evaluated:Patient Re-evaluated prior to inductionOxygen Delivery Method: Nasal cannula Placement Confirmation: positive ETCO2       

## 2015-12-30 NOTE — Anesthesia Preprocedure Evaluation (Signed)
Anesthesia Evaluation  Patient identified by MRN, date of birth, ID band  Reviewed: Allergy & Precautions, H&P , NPO status , Patient's Chart, lab work & pertinent test results  Airway Mallampati: II  TM Distance: >3 FB Neck ROM: full    Dental no notable dental hx.    Pulmonary    Pulmonary exam normal        Cardiovascular + Peripheral Vascular Disease   Rhythm:regular Rate:Normal     Neuro/Psych    GI/Hepatic   Endo/Other  diabetes  Renal/GU      Musculoskeletal   Abdominal   Peds  Hematology   Anesthesia Other Findings   Reproductive/Obstetrics                             Anesthesia Physical Anesthesia Plan  ASA: II  Anesthesia Plan: MAC   Post-op Pain Management:    Induction:   Airway Management Planned:   Additional Equipment:   Intra-op Plan:   Post-operative Plan:   Informed Consent: I have reviewed the patients History and Physical, chart, labs and discussed the procedure including the risks, benefits and alternatives for the proposed anesthesia with the patient or authorized representative who has indicated his/her understanding and acceptance.     Plan Discussed with: CRNA  Anesthesia Plan Comments:         Anesthesia Quick Evaluation

## 2015-12-31 ENCOUNTER — Encounter: Payer: Self-pay | Admitting: Ophthalmology

## 2016-01-07 DIAGNOSIS — D709 Neutropenia, unspecified: Secondary | ICD-10-CM | POA: Diagnosis not present

## 2016-01-07 DIAGNOSIS — M810 Age-related osteoporosis without current pathological fracture: Secondary | ICD-10-CM | POA: Diagnosis not present

## 2016-01-07 DIAGNOSIS — E138 Other specified diabetes mellitus with unspecified complications: Secondary | ICD-10-CM | POA: Diagnosis not present

## 2016-01-07 DIAGNOSIS — E7800 Pure hypercholesterolemia, unspecified: Secondary | ICD-10-CM | POA: Diagnosis not present

## 2016-01-07 DIAGNOSIS — I159 Secondary hypertension, unspecified: Secondary | ICD-10-CM | POA: Diagnosis not present

## 2016-01-07 DIAGNOSIS — D708 Other neutropenia: Secondary | ICD-10-CM | POA: Insufficient documentation

## 2016-01-07 DIAGNOSIS — Z7952 Long term (current) use of systemic steroids: Secondary | ICD-10-CM | POA: Insufficient documentation

## 2016-01-07 DIAGNOSIS — M353 Polymyalgia rheumatica: Secondary | ICD-10-CM | POA: Diagnosis not present

## 2016-01-13 ENCOUNTER — Telehealth: Payer: Self-pay | Admitting: Family Medicine

## 2016-01-13 NOTE — Telephone Encounter (Signed)
Requesting return call. Patient was seen a few weeks ago and was prescribed Prednisone. It elevated his blood pressure to 200 (it has never been that high).

## 2016-01-13 NOTE — Telephone Encounter (Signed)
He should have finished the medication, if bp is very high, he needs to be seen

## 2016-01-14 NOTE — Telephone Encounter (Signed)
Patient stated that he has finished the medication but his BP was still elevated. He was then asked if he could come in tomorrow for a f/u visit regarding his BP and he said yes.   His appt is 01/15/16 at 1:40pm.

## 2016-01-15 ENCOUNTER — Ambulatory Visit: Payer: Medicare Other | Admitting: Family Medicine

## 2016-01-16 ENCOUNTER — Other Ambulatory Visit: Payer: Self-pay

## 2016-01-16 MED ORDER — GLUCOSE BLOOD VI STRP: ORAL_STRIP | Status: DC

## 2016-01-17 ENCOUNTER — Ambulatory Visit: Payer: Medicare Other | Admitting: Family Medicine

## 2016-01-22 ENCOUNTER — Ambulatory Visit (INDEPENDENT_AMBULATORY_CARE_PROVIDER_SITE_OTHER): Payer: Medicare Other | Admitting: Family Medicine

## 2016-01-22 ENCOUNTER — Encounter: Payer: Self-pay | Admitting: Family Medicine

## 2016-01-22 ENCOUNTER — Ambulatory Visit: Payer: Medicare Other | Admitting: Family Medicine

## 2016-01-22 VITALS — BP 118/74 | HR 76 | Temp 97.7°F | Resp 14 | Wt 138.6 lb

## 2016-01-22 DIAGNOSIS — I10 Essential (primary) hypertension: Secondary | ICD-10-CM | POA: Diagnosis not present

## 2016-01-22 DIAGNOSIS — M353 Polymyalgia rheumatica: Secondary | ICD-10-CM

## 2016-01-22 DIAGNOSIS — E1165 Type 2 diabetes mellitus with hyperglycemia: Secondary | ICD-10-CM | POA: Diagnosis not present

## 2016-01-22 DIAGNOSIS — IMO0001 Reserved for inherently not codable concepts without codable children: Secondary | ICD-10-CM

## 2016-01-22 MED ORDER — GLIPIZIDE 5 MG PO TABS: 2.5000 mg | ORAL_TABLET | Freq: Two times a day (BID) | ORAL | Status: DC

## 2016-01-22 MED ORDER — PREDNISONE 5 MG PO TABS: 15.0000 mg | ORAL_TABLET | Freq: Every day | ORAL | Status: DC

## 2016-01-22 NOTE — Progress Notes (Signed)
Name: Austin Hunt   MRN: [id number]    DOB: 1941/08/01   Date:01/22/2016       Progress Note  Subjective  Chief Complaint  Chief Complaint  Patient presents with  . Follow-up    patient went to the Rheumatologist and was told to increase his prednisone to 1 1/2 tablets.    HPI  Polymyalgia Rheumatica: he was seen by Rheumatologist and advised to take 15 mg of prednisone daily, however he stopped medication because glucose was going up to 200. He states pain is not as intense. Only having pain on his shoulders now.   DM type II: glucose was in the 200's range when he was taking prednisone, he stopped taking prednisone on 03/08 and glucose is gradually coming down. Yesterday it was down to 190's and this morning was 153. He has been taking protein supplementation.   HTN: bp is back to normal, no chest pain or palpitation   Patient Active Problem List   Diagnosis Date Noted  . Long term current use of systemic steroids 01/07/2016  . Leukopenia 12/24/2015  . Polymyalgia rheumatica ([place]) 12/24/2015  . Right groin pain 04/19/2015  . Anxiety 04/08/2015  . 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 Chronic constipation 04/08/2015  . 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 Diabetes mellitus type 2, uncontrolled, without complications ([place]) 123456  . Failure of erection 04/08/2015  . Fatigue 04/08/2015  . H/O inguinal hernia repair 04/08/2015  . HLD (hyperlipidemia) 04/08/2015  . 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 LBP (low back pain) 04/08/2015  . 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 Drug intolerance 04/08/2015  . Kidney lump 04/08/2015  . TI (tricuspid incompetence) 04/08/2015  . Unilateral recurrent femoral hernia without obstruction or gangrene 05/23/2014  . Femoral hernia 05/23/2014  . Right inguinal hernia 02/06/2014  . Cataract 01/16/2014  . Glaucoma 01/16/2014  . Abnormal loss of weight 08/20/2010  . Reflux esophagitis 06/21/2009  . Coronary atherosclerosis  04/03/2009  . Benign enlargement of prostate 02/11/2009  . Decreased libido 01/12/2008  . Benign essential HTN 09/12/2007    Past Surgical History  Procedure Laterality Date  . Appendectomy  1954  . Eye surgery  2011, 2013    cataracts  . Eye surgery Right May 2016    glaucoma  . Bladder surgery  Oct 2015  . Prostate surgery  Oct 2015  . Colonoscopy    . Hernia repair Left 1967  . Hernia repair Right 05-10-14    Right femoral hernia repair Dr Bary Castilla with PerFix plug, no onlay mesh..   . Cardiac catheterization  2002    1 stent  . Photocoagulation with laser Right 12/30/2015    Procedure: PHOTOCOAGULATION WITH LASER;  Surgeon: Ronnell Freshwater, MD;  Location: [facility];  Service: Ophthalmology;  Laterality: Right;  DIABETIC - oral meds IVA BLOCK    Family History  Problem Relation Age of Onset  . Prostate cancer Neg Hx   . Bladder Cancer Neg Hx     Social History   Social History  . Marital Status: Married    Spouse Name: N/A  . Number of Children: N/A  . Years of Education: N/A   Occupational History  . Not on file.   Social History Main Topics  . Smoking status: Never Smoker   . Smokeless tobacco: Never Used  . Alcohol Use: No  . Drug Use: No  . Sexual Activity: Not on file   Other Topics Concern  . Not on file  Social History Narrative     Current outpatient prescriptions:  .  ACCU-CHEK SOFTCLIX LANCETS lancets, USE TO TEST BLOOD SUGAR TWICE DAILY., Disp: 100 each, Rfl: 11 .  ALTACE 2.5 MG capsule, TAKE 1 CAPSULE BY MOUTH ONCE EVERY OTHERDAY., Disp: 45 capsule, Rfl: 2 .  aspirin 81 MG tablet, Take 81 mg by mouth daily., Disp: , Rfl:  .  brimonidine-timolol (COMBIGAN) 0.2-0.5 % ophthalmic solution, Place 1 drop into both eyes every 12 (twelve) hours., Disp: , Rfl:  .  dorzolamide (TRUSOPT) 2 % ophthalmic solution, , Disp: , Rfl:  .  Garlic 10 MG CAPS, Take by mouth., Disp: , Rfl:  .  glucose blood (ACCU-CHEK AVIVA PLUS) test strip, Use  as instructed, Disp: 100 each, Rfl: 11 .  lubiprostone (AMITIZA) 24 MCG capsule, Take 1 capsule (24 mcg total) by mouth 2 (two) times daily with a meal., Disp: 60 capsule, Rfl: 2 .  metFORMIN (GLUCOPHAGE-XR) 500 MG 24 hr tablet, TAKE 1 TABLET BY MOUTH EVERY MORNING AND 2 TABLETS EVERY EVENING., Disp: 270 tablet, Rfl: 1 .  Multiple Vitamin (MULTIVITAMIN) tablet, Take 1 tablet by mouth daily., Disp: , Rfl:  .  OMEGA-3 FATTY ACIDS PO, Take 1 tablet by mouth daily., Disp: , Rfl:  .  Polyethylene Glycol 3350 (MIRALAX PO), Take by mouth as needed., Disp: , Rfl:  .  travoprost, benzalkonium, (TRAVATAN) 0.004 % ophthalmic solution, 1 drop at bedtime., Disp: , Rfl:  .  glipiZIDE (GLUCOTROL) 5 MG tablet, Take 0.5-1 tablets (2.5-5 mg total) by mouth 2 (two) times daily before a meal., Disp: 60 tablet, Rfl: 0 .  predniSONE (DELTASONE) 5 MG tablet, Take 3 tablets (15 mg total) by mouth daily. Reported on 01/22/2016, Disp: 90 tablet, Rfl: 0  Allergies  Allergen Reactions  . Penicillins Itching  . Statins Other (See Comments)    myalgia  . Welchol [Colesevelam Hcl]      ROS  Ten systems reviewed and is negative except as mentioned in HPI   Objective  Filed Vitals:   01/22/16 1137  BP: 118/74  Pulse: 76  Temp: 97.7 F (36.5 C)  TempSrc: Oral  Resp: 14  Weight: 138 lb 9.6 oz (62.869 kg)  SpO2: 99%    Body mass index is 20.46 kg/(m^2).  Physical Exam  Constitutional: Patient appears well-developed and thin No distress.  HEENT: head atraumatic, normocephalic, pupils equal and reactive to light, ears TM normal , neck supple, throat within normal limits Cardiovascular: Normal rate, regular rhythm and normal heart sounds. No murmur heard. No BLE edema. Pulmonary/Chest: Effort normal and breath sounds normal. No respiratory distress. Abdominal: Soft. There is no tenderness. Psychiatric: Patient has a normal mood and affect. behavior is normal. Judgment and thought content normal. Muscular  Skeletal: no pain during palpation of joints, no synovitis, normal rom, mild pain during palpation of lumbar spine.  Recent Results (from the past 2160 hour(s))  POCT Influenza A/B     Status: Normal   Collection Time: 12/19/15 12:54 PM  Result Value Ref Range   Influenza A, POC Negative Negative   Influenza B, POC Negative Negative  CBC with Differential/Platelet     Status: Abnormal   Collection Time: 12/19/15  2:20 PM  Result Value Ref Range   WBC 2.2 (LL) 3.4 - 10.8 x10E3/uL   RBC 5.01 4.14 - 5.80 x10E6/uL   Hemoglobin 14.7 12.6 - 17.7 g/dL   Hematocrit 43.8 37.5 - 51.0 %   MCV 87 79 - 97 fL   MCH  29.3 26.6 - 33.0 pg   MCHC 33.6 31.5 - 35.7 g/dL   RDW 12.3 12.3 - 15.4 %   Platelets 197 150 - 379 x10E3/uL   Neutrophils 31 %   Lymphs 55 %   Monocytes 11 %   Eos 1 %   Basos 1 %   Neutrophils Absolute 0.7 (L) 1.4 - 7.0 x10E3/uL   Lymphocytes Absolute 1.2 0.7 - 3.1 x10E3/uL   Monocytes Absolute 0.3 0.1 - 0.9 x10E3/uL   EOS (ABSOLUTE) 0.0 0.0 - 0.4 x10E3/uL   Basophils Absolute 0.0 0.0 - 0.2 x10E3/uL   Immature Granulocytes 1 %   Immature Grans (Abs) 0.0 0.0 - 0.1 x10E3/uL   Hematology Comments: Note:     Comment: Verified by microscopic examination.  C-reactive protein     Status: Abnormal   Collection Time: 12/19/15  2:20 PM  Result Value Ref Range   CRP 45.5 (H) 0.0 - 4.9 mg/L  Sedimentation rate     Status: None   Collection Time: 12/19/15  2:20 PM  Result Value Ref Range   Sed Rate 19 0 - 30 mm/hr  Comprehensive metabolic panel     Status: Abnormal   Collection Time: 12/19/15  2:20 PM  Result Value Ref Range   Glucose 207 (H) 65 - 99 mg/dL   BUN 14 8 - 27 mg/dL   Creatinine, Ser 0.93 0.76 - 1.27 mg/dL   GFR calc non Af Amer 79 >59 mL/min/1.73   GFR calc Af Amer 92 >59 mL/min/1.73   BUN/Creatinine Ratio 15 10 - 22   Sodium 135 134 - 144 mmol/L   Potassium 4.8 3.5 - 5.2 mmol/L   Chloride 94 (L) 96 - 106 mmol/L   CO2 24 18 - 29 mmol/L   Calcium 9.4 8.6 - 10.2  mg/dL   Total Protein 7.3 6.0 - 8.5 g/dL   Albumin 4.4 3.5 - 4.8 g/dL   Globulin, Total 2.9 1.5 - 4.5 g/dL   Albumin/Globulin Ratio 1.5 1.1 - 2.5    Comment: **Effective January 13, 2016 the reference interval**   for A/G Ratio will be changing to:              Age                Male          Male           0 -  7 days       1.1 - 2.3       1.1 - 2.3           8 - 30 days       1.2 - 2.8       1.2 - 2.8           1 -  6 months     1.3 - 3.6       1.3 - 3.6    7 months -  5 years      1.5 - 2.6       1.5 - 2.6              > 5 years      1.2 - 2.2       1.2 - 2.2    Bilirubin Total 0.5 0.0 - 1.2 mg/dL   Alkaline Phosphatase 74 39 - 117 IU/L   AST 28 0 - 40 IU/L   ALT 14 0 - 44 IU/L  Hemoglobin A1c  Status: Abnormal   Collection Time: 12/19/15  2:20 PM  Result Value Ref Range   Hgb A1c MFr Bld 8.1 (H) 4.8 - 5.6 %    Comment:          Pre-diabetes: 5.7 - 6.4          Diabetes: >6.4          Glycemic control for adults with diabetes: <7.0    Est. average glucose Bld gHb Est-mCnc 186 mg/dL  Glucose, capillary     Status: Abnormal   Collection Time: 12/30/15  6:50 AM  Result Value Ref Range   Glucose-Capillary 118 (H) 65 - 99 mg/dL  Glucose, capillary     Status: Abnormal   Collection Time: 12/30/15  8:13 AM  Result Value Ref Range   Glucose-Capillary 107 (H) 65 - 99 mg/dL     PHQ2/9: Depression screen [facility] 2/9 01/22/2016 12/24/2015 12/19/2015 08/14/2015 04/08/2015  Decreased Interest 0 0 0 0 0  Down, Depressed, Hopeless 0 0 0 0 0  PHQ - 2 Score 0 0 0 0 0     Fall Risk: Fall Risk  01/22/2016 12/24/2015 12/19/2015 08/14/2015 04/08/2015  Falls in the past year? No No No No No     Functional Status Survey: Is the patient deaf or have difficulty hearing?: No Does the patient have difficulty seeing, even when wearing glasses/contacts?: No Does the patient have difficulty concentrating, remembering, or making decisions?: No Does the patient have difficulty walking or climbing  stairs?: No Does the patient have difficulty dressing or bathing?: No Does the patient have difficulty doing errands alone such as visiting a doctor's office or shopping?: No    Assessment & Plan  1. Polymyalgia rheumatica (HCC)  Discussed importance of taking medication as prescribed, and we will adjust DM medications so his glucose does not go up - predniSONE (DELTASONE) 5 MG tablet; Take 3 tablets (15 mg total) by mouth daily. Reported on 01/22/2016  Dispense: 90 tablet; Refill: 0  2. Uncontrolled type 2 diabetes mellitus without complication, without long-term current use of insulin ([place])  Explained that if he will take prednisone, take it twice daily, otherwise can take prn if glucose elevated prior to meals. Risk of hypoglycemic discussed with patient.  - glipiZIDE (GLUCOTROL) 5 MG tablet; Take 0.5-1 tablets (2.5-5 mg total) by mouth 2 (two) times daily before a meal.  Dispense: 60 tablet; Refill: 0  3. Essential hypertension  He takes it every other day, bp has been at goal now

## 2016-01-29 DIAGNOSIS — R221 Localized swelling, mass and lump, neck: Secondary | ICD-10-CM | POA: Diagnosis not present

## 2016-02-05 ENCOUNTER — Ambulatory Visit: Payer: Medicare Other | Admitting: Family Medicine

## 2016-02-11 DIAGNOSIS — Z7952 Long term (current) use of systemic steroids: Secondary | ICD-10-CM | POA: Diagnosis not present

## 2016-02-11 DIAGNOSIS — M353 Polymyalgia rheumatica: Secondary | ICD-10-CM | POA: Diagnosis not present

## 2016-02-15 ENCOUNTER — Other Ambulatory Visit: Payer: Self-pay | Admitting: Family Medicine

## 2016-02-28 ENCOUNTER — Encounter: Payer: Self-pay | Admitting: Family Medicine

## 2016-02-28 ENCOUNTER — Ambulatory Visit (INDEPENDENT_AMBULATORY_CARE_PROVIDER_SITE_OTHER): Payer: Medicare Other | Admitting: Family Medicine

## 2016-02-28 VITALS — BP 110/66 | HR 68 | Temp 97.6°F | Resp 12 | Wt 140.1 lb

## 2016-02-28 DIAGNOSIS — M353 Polymyalgia rheumatica: Secondary | ICD-10-CM | POA: Diagnosis not present

## 2016-02-28 DIAGNOSIS — D72819 Decreased white blood cell count, unspecified: Secondary | ICD-10-CM

## 2016-02-28 DIAGNOSIS — I10 Essential (primary) hypertension: Secondary | ICD-10-CM | POA: Diagnosis not present

## 2016-02-28 DIAGNOSIS — E119 Type 2 diabetes mellitus without complications: Secondary | ICD-10-CM | POA: Diagnosis not present

## 2016-02-28 NOTE — Progress Notes (Signed)
Name: Austin Hunt   MRN: [id number]    DOB: 1941-02-17   Date:02/28/2016       Progress Note  Subjective  Chief Complaint  Chief Complaint  Patient presents with  . polymyalgia rheumatica    patient is here for his 65-month f/u.  . Diabetes    patient checks his blood sugar everyday. highest: 212 (with prednisone) & lowest:85. normally runs aroungd 110-115.  Marland Kitchen Hypertension    patient present with no neg sx.    HPI  PMR: he states that he was feeling fine on Saturday Feb 11th and woke up on Sunday the 12 th feeling stiff on his hips and with aching sensation on both shoulders, both hips, lower back and both legs were feeling Tired. He lost his appetite but blood sugar was higher than usual.  He was started on prednisone on 12/20/2015 and is feeling and has seen Rheumatologist, sed rate is normal, C-reactive protein is back to normal, no longer has pain and weaning down on prednisone now and is doing very well. No longer losing weight.   DMII: glucose was very high when he first started on prednisone, but he is taking Glipizide twice daily and titrating down on prednisone and his glucose has been at goal now, fasting around 110's. Denies hypoglycemia. He denies polyphagia, polydipsia or polyuria.   Leukopenia: WBC was low , but rechecked by Rheumatologist and is back to normal now  HTN: patient is taking medication and denies side effects of medication   Patient Active Problem List   Diagnosis Date Noted  . Long term current use of systemic steroids 01/07/2016  . Leukopenia 12/24/2015  . Polymyalgia rheumatica ([place]) 12/24/2015  . Right groin pain 04/19/2015  . Anxiety 04/08/2015  . 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 Chronic constipation 04/08/2015  . 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 Diabetes mellitus type 2, uncontrolled, without complications ([place]) 123456  . Failure of erection 04/08/2015  . Fatigue 04/08/2015  . H/O inguinal hernia repair 04/08/2015  .  HLD (hyperlipidemia) 04/08/2015  . 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 LBP (low back pain) 04/08/2015  . 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 Drug intolerance 04/08/2015  . Kidney lump 04/08/2015  . TI (tricuspid incompetence) 04/08/2015  . Unilateral recurrent femoral hernia without obstruction or gangrene 05/23/2014  . Femoral hernia 05/23/2014  . Right inguinal hernia 02/06/2014  . Cataract 01/16/2014  . Glaucoma 01/16/2014  . Abnormal loss of weight 08/20/2010  . Reflux esophagitis 06/21/2009  . Coronary atherosclerosis 04/03/2009  . Benign enlargement of prostate 02/11/2009  . Decreased libido 01/12/2008  . Benign essential HTN 09/12/2007    Past Surgical History  Procedure Laterality Date  . Appendectomy  1954  . Eye surgery  2011, 2013    cataracts  . Eye surgery Right May 2016    glaucoma  . Bladder surgery  Oct 2015  . Prostate surgery  Oct 2015  . Colonoscopy    . Hernia repair Left 1967  . Hernia repair Right 05-10-14    Right femoral hernia repair Dr Bary Castilla with PerFix plug, no onlay mesh..   . Cardiac catheterization  2002    1 stent  . Photocoagulation with laser Right 12/30/2015    Procedure: PHOTOCOAGULATION WITH LASER;  Surgeon: Ronnell Freshwater, MD;  Location: [facility];  Service: Ophthalmology;  Laterality: Right;  DIABETIC - oral meds IVA BLOCK    Family History  Problem Relation Age of Onset  . Prostate cancer Neg Hx   .  Bladder Cancer Neg Hx     Social History   Social History  . Marital Status: Married    Spouse Name: N/A  . Number of Children: N/A  . Years of Education: N/A   Occupational History  . Not on file.   Social History Main Topics  . Smoking status: Never Smoker   . Smokeless tobacco: Never Used  . Alcohol Use: No  . Drug Use: No  . Sexual Activity: Not on file   Other Topics Concern  . Not on file   Social History Narrative     Current outpatient prescriptions:  .  ACCU-CHEK SOFTCLIX LANCETS  lancets, USE TO TEST BLOOD SUGAR TWICE DAILY., Disp: 100 each, Rfl: 11 .  ALTACE 2.5 MG capsule, TAKE 1 CAPSULE BY MOUTH ONCE EVERY OTHERDAY., Disp: 45 capsule, Rfl: 2 .  aspirin 81 MG tablet, Take 81 mg by mouth daily., Disp: , Rfl:  .  brimonidine-timolol (COMBIGAN) 0.2-0.5 % ophthalmic solution, Place 1 drop into both eyes every 12 (twelve) hours., Disp: , Rfl:  .  dorzolamide (TRUSOPT) 2 % ophthalmic solution, , Disp: , Rfl:  .  Garlic 10 MG CAPS, Take by mouth., Disp: , Rfl:  .  glipiZIDE (GLUCOTROL) 5 MG tablet, TAKE 1/2 TO 1 TABLET BY MOUTH TWICE DAILY BEFORE A MEAL, Disp: 60 tablet, Rfl: 0 .  glucose blood (ACCU-CHEK AVIVA PLUS) test strip, Use as instructed, Disp: 100 each, Rfl: 11 .  lubiprostone (AMITIZA) 24 MCG capsule, Take 1 capsule (24 mcg total) by mouth 2 (two) times daily with a meal., Disp: 60 capsule, Rfl: 2 .  metFORMIN (GLUCOPHAGE-XR) 500 MG 24 hr tablet, TAKE 1 TABLET BY MOUTH EVERY MORNING AND 2 TABLETS EVERY EVENING., Disp: 270 tablet, Rfl: 1 .  Multiple Vitamin (MULTIVITAMIN) tablet, Take 1 tablet by mouth daily., Disp: , Rfl:  .  OMEGA-3 FATTY ACIDS PO, Take 1 tablet by mouth daily., Disp: , Rfl:  .  Polyethylene Glycol 3350 (MIRALAX PO), Take by mouth as needed., Disp: , Rfl:  .  predniSONE (DELTASONE) 5 MG tablet, Take 3 tablets (15 mg total) by mouth daily. Reported on 01/22/2016, Disp: 90 tablet, Rfl: 0 .  travoprost, benzalkonium, (TRAVATAN) 0.004 % ophthalmic solution, 1 drop at bedtime., Disp: , Rfl:   Allergies  Allergen Reactions  . Penicillins Itching  . Statins Other (See Comments)    myalgia  . Welchol [Colesevelam Hcl]      ROS  Ten systems reviewed and is negative except as mentioned in HPI   Objective  Filed Vitals:   02/28/16 1043  BP: 110/66  Pulse: 68  Temp: 97.6 F (36.4 C)  TempSrc: Oral  Resp: 12  Weight: 140 lb 1.6 oz (63.549 kg)  SpO2: 97%    Body mass index is 20.68 kg/(m^2).  Physical Exam  Constitutional: Patient  appears well-developed and well-nourished.  No distress.  HEENT: head atraumatic, normocephalic, pupils equal and reactive to light,neck supple, throat within normal limits Cardiovascular: Normal rate, regular rhythm and normal heart sounds.  No murmur heard. No BLE edema. Pulmonary/Chest: Effort normal and breath sounds normal. No respiratory distress. Abdominal: Soft.  There is no tenderness. Psychiatric: Patient has a normal mood and affect. behavior is normal. Judgment and thought content normal.  Recent Results (from the past 2160 hour(s))  POCT Influenza A/B     Status: Normal   Collection Time: 12/19/15 12:54 PM  Result Value Ref Range   Influenza A, POC Negative Negative   Influenza B,  POC Negative Negative  CBC with Differential/Platelet     Status: Abnormal   Collection Time: 12/19/15  2:20 PM  Result Value Ref Range   WBC 2.2 (LL) 3.4 - 10.8 x10E3/uL   RBC 5.01 4.14 - 5.80 x10E6/uL   Hemoglobin 14.7 12.6 - 17.7 g/dL   Hematocrit 43.8 37.5 - 51.0 %   MCV 87 79 - 97 fL   MCH 29.3 26.6 - 33.0 pg   MCHC 33.6 31.5 - 35.7 g/dL   RDW 12.3 12.3 - 15.4 %   Platelets 197 150 - 379 x10E3/uL   Neutrophils 31 %   Lymphs 55 %   Monocytes 11 %   Eos 1 %   Basos 1 %   Neutrophils Absolute 0.7 (L) 1.4 - 7.0 x10E3/uL   Lymphocytes Absolute 1.2 0.7 - 3.1 x10E3/uL   Monocytes Absolute 0.3 0.1 - 0.9 x10E3/uL   EOS (ABSOLUTE) 0.0 0.0 - 0.4 x10E3/uL   Basophils Absolute 0.0 0.0 - 0.2 x10E3/uL   Immature Granulocytes 1 %   Immature Grans (Abs) 0.0 0.0 - 0.1 x10E3/uL   Hematology Comments: Note:     Comment: Verified by microscopic examination.  C-reactive protein     Status: Abnormal   Collection Time: 12/19/15  2:20 PM  Result Value Ref Range   CRP 45.5 (H) 0.0 - 4.9 mg/L  Sedimentation rate     Status: None   Collection Time: 12/19/15  2:20 PM  Result Value Ref Range   Sed Rate 19 0 - 30 mm/hr  Comprehensive metabolic panel     Status: Abnormal   Collection Time: 12/19/15  2:20  PM  Result Value Ref Range   Glucose 207 (H) 65 - 99 mg/dL   BUN 14 8 - 27 mg/dL   Creatinine, Ser 0.93 0.76 - 1.27 mg/dL   GFR calc non Af Amer 79 >59 mL/min/1.73   GFR calc Af Amer 92 >59 mL/min/1.73   BUN/Creatinine Ratio 15 10 - 22   Sodium 135 134 - 144 mmol/L   Potassium 4.8 3.5 - 5.2 mmol/L   Chloride 94 (L) 96 - 106 mmol/L   CO2 24 18 - 29 mmol/L   Calcium 9.4 8.6 - 10.2 mg/dL   Total Protein 7.3 6.0 - 8.5 g/dL   Albumin 4.4 3.5 - 4.8 g/dL   Globulin, Total 2.9 1.5 - 4.5 g/dL   Albumin/Globulin Ratio 1.5 1.1 - 2.5    Comment: **Effective January 13, 2016 the reference interval**   for A/G Ratio will be changing to:              Age                Male          Male           0 -  7 days       1.1 - 2.3       1.1 - 2.3           8 - 30 days       1.2 - 2.8       1.2 - 2.8           1 -  6 months     1.3 - 3.6       1.3 - 3.6    7 months -  5 years      1.5 - 2.6       1.5 - 2.6              >  5 years      1.2 - 2.2       1.2 - 2.2    Bilirubin Total 0.5 0.0 - 1.2 mg/dL   Alkaline Phosphatase 74 39 - 117 IU/L   AST 28 0 - 40 IU/L   ALT 14 0 - 44 IU/L  Hemoglobin A1c     Status: Abnormal   Collection Time: 12/19/15  2:20 PM  Result Value Ref Range   Hgb A1c MFr Bld 8.1 (H) 4.8 - 5.6 %    Comment:          Pre-diabetes: 5.7 - 6.4          Diabetes: >6.4          Glycemic control for adults with diabetes: <7.0    Est. average glucose Bld gHb Est-mCnc 186 mg/dL  Glucose, capillary     Status: Abnormal   Collection Time: 12/30/15  6:50 AM  Result Value Ref Range   Glucose-Capillary 118 (H) 65 - 99 mg/dL  Glucose, capillary     Status: Abnormal   Collection Time: 12/30/15  8:13 AM  Result Value Ref Range   Glucose-Capillary 107 (H) 65 - 99 mg/dL    Diabetic Foot Exam: Diabetic Foot Exam - Simple   Simple Foot Form  Diabetic Foot exam was performed with the following findings:  Yes 02/28/2016 11:21 AM  Visual Inspection  No deformities, no ulcerations, no other  skin breakdown bilaterally:  Yes  Sensation Testing  Intact to touch and monofilament testing bilaterally:  Yes  Pulse Check  Posterior Tibialis and Dorsalis pulse intact bilaterally:  Yes  Comments      PHQ2/9: Depression screen [facility] 2/9 02/28/2016 01/22/2016 12/24/2015 12/19/2015 08/14/2015  Decreased Interest 0 0 0 0 0  Down, Depressed, Hopeless 0 0 0 0 0  PHQ - 2 Score 0 0 0 0 0     Fall Risk: Fall Risk  02/28/2016 01/22/2016 12/24/2015 12/19/2015 08/14/2015  Falls in the past year? No No No No No      Functional Status Survey: Is the patient deaf or have difficulty hearing?: No Does the patient have difficulty seeing, even when wearing glasses/contacts?: No Does the patient have difficulty concentrating, remembering, or making decisions?: No Does the patient have difficulty walking or climbing stairs?: No Does the patient have difficulty dressing or bathing?: No Does the patient have difficulty doing errands alone such as visiting a doctor's office or shopping?: No    Assessment & Plan  1. Polymyalgia rheumatica (HCC)  Doing well, no longer has pain   2. Controlled type 2 diabetes mellitus without complication, without long-term current use of insulin ([place])  Doing well now, may wean self off glipizide once he goes off prednisone if glucose starts to drop ( goal is fasting between 100-140 )  3. Essential hypertension  Doing very well, continue medication   4. Leukopenia  resolved

## 2016-03-05 DIAGNOSIS — I10 Essential (primary) hypertension: Secondary | ICD-10-CM | POA: Diagnosis not present

## 2016-03-05 DIAGNOSIS — M1990 Unspecified osteoarthritis, unspecified site: Secondary | ICD-10-CM | POA: Diagnosis not present

## 2016-03-05 DIAGNOSIS — R5383 Other fatigue: Secondary | ICD-10-CM | POA: Diagnosis not present

## 2016-03-05 DIAGNOSIS — R51 Headache: Secondary | ICD-10-CM | POA: Diagnosis not present

## 2016-03-05 DIAGNOSIS — I209 Angina pectoris, unspecified: Secondary | ICD-10-CM | POA: Diagnosis not present

## 2016-03-05 DIAGNOSIS — E119 Type 2 diabetes mellitus without complications: Secondary | ICD-10-CM | POA: Diagnosis not present

## 2016-03-05 DIAGNOSIS — N400 Enlarged prostate without lower urinary tract symptoms: Secondary | ICD-10-CM | POA: Diagnosis not present

## 2016-03-05 DIAGNOSIS — Z8739 Personal history of other diseases of the musculoskeletal system and connective tissue: Secondary | ICD-10-CM | POA: Diagnosis not present

## 2016-03-05 DIAGNOSIS — E784 Other hyperlipidemia: Secondary | ICD-10-CM | POA: Diagnosis not present

## 2016-03-05 DIAGNOSIS — I2510 Atherosclerotic heart disease of native coronary artery without angina pectoris: Secondary | ICD-10-CM | POA: Diagnosis not present

## 2016-03-13 ENCOUNTER — Encounter: Payer: Self-pay | Admitting: Sports Medicine

## 2016-03-13 ENCOUNTER — Ambulatory Visit (INDEPENDENT_AMBULATORY_CARE_PROVIDER_SITE_OTHER): Payer: Medicare Other | Admitting: Sports Medicine

## 2016-03-13 DIAGNOSIS — B351 Tinea unguium: Secondary | ICD-10-CM | POA: Diagnosis not present

## 2016-03-13 DIAGNOSIS — E119 Type 2 diabetes mellitus without complications: Secondary | ICD-10-CM

## 2016-03-13 DIAGNOSIS — M79672 Pain in left foot: Secondary | ICD-10-CM

## 2016-03-13 DIAGNOSIS — M79671 Pain in right foot: Secondary | ICD-10-CM | POA: Diagnosis not present

## 2016-03-13 NOTE — Progress Notes (Signed)
Patient ID: Austin Hunt, male   DOB: [birth year], 75 y.o.   MRN: [id number]  Subjective: Austin Hunt is a 75 y.o. male patient with history of type 2 diabetes who presents to office today complaining of long, painful nails  while ambulating in shoes; unable to trim. Patient states that the glucose reading this morning was 115 mg/dl. Patient denies any new changes in medication or new problems. Patient denies any new cramping, numbness, burning or tingling in the legs.  Patient Active Problem List   Diagnosis Date Noted  . Long term current use of systemic steroids 01/07/2016  . Polymyalgia rheumatica ([place]) 12/24/2015  . Right groin pain 04/19/2015  . Anxiety 04/08/2015  . 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 Chronic constipation 04/08/2015  . 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 Diabetes mellitus type 2, uncontrolled, without complications ([place]) 123456  . Failure of erection 04/08/2015  . H/O inguinal hernia repair 04/08/2015  . HLD (hyperlipidemia) 04/08/2015  . 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 LBP (low back pain) 04/08/2015  . 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 Drug intolerance 04/08/2015  . Kidney lump 04/08/2015  . TI (tricuspid incompetence) 04/08/2015  . Unilateral recurrent femoral hernia without obstruction or gangrene 05/23/2014  . Femoral hernia 05/23/2014  . Right inguinal hernia 02/06/2014  . Cataract 01/16/2014  . Glaucoma 01/16/2014  . Reflux esophagitis 06/21/2009  . Coronary atherosclerosis 04/03/2009  . Benign enlargement of prostate 02/11/2009  . Decreased libido 01/12/2008  . Benign essential HTN 09/12/2007   Current Outpatient Prescriptions on File Prior to Visit  Medication Sig Dispense Refill  . ACCU-CHEK SOFTCLIX LANCETS lancets USE TO TEST BLOOD SUGAR TWICE DAILY. 100 each 11  . ALTACE 2.5 MG capsule TAKE 1 CAPSULE BY MOUTH ONCE EVERY OTHERDAY. 45 capsule 2  . aspirin 81 MG tablet Take 81 mg by mouth daily.    . brimonidine-timolol  (COMBIGAN) 0.2-0.5 % ophthalmic solution Place 1 drop into both eyes every 12 (twelve) hours.    . dorzolamide (TRUSOPT) 2 % ophthalmic solution     . Garlic 10 MG CAPS Take by mouth.    Marland Kitchen glipiZIDE (GLUCOTROL) 5 MG tablet TAKE 1/2 TO 1 TABLET BY MOUTH TWICE DAILY BEFORE A MEAL 60 tablet 0  . glucose blood (ACCU-CHEK AVIVA PLUS) test strip Use as instructed 100 each 11  . lubiprostone (AMITIZA) 24 MCG capsule Take 1 capsule (24 mcg total) by mouth 2 (two) times daily with a meal. 60 capsule 2  . metFORMIN (GLUCOPHAGE-XR) 500 MG 24 hr tablet TAKE 1 TABLET BY MOUTH EVERY MORNING AND 2 TABLETS EVERY EVENING. 270 tablet 1  . Multiple Vitamin (MULTIVITAMIN) tablet Take 1 tablet by mouth daily.    . OMEGA-3 FATTY ACIDS PO Take 1 tablet by mouth daily.    . Polyethylene Glycol 3350 (MIRALAX PO) Take by mouth as needed.    . predniSONE (DELTASONE) 5 MG tablet Take 3 tablets (15 mg total) by mouth daily. Reported on 01/22/2016 90 tablet 0  . travoprost, benzalkonium, (TRAVATAN) 0.004 % ophthalmic solution 1 drop at bedtime.     No current facility-administered medications on file prior to visit.   Allergies  Allergen Reactions  . Penicillins Itching  . Statins Other (See Comments)    myalgia  . Welchol [Colesevelam Hcl]     Objective: General: Patient is awake, alert, and oriented x 3 and in no acute distress.  Integument: Skin is warm, dry and supple bilateral. Nails are tender, long, thickened and  dystrophic  with subungual debris, consistent with onychomycosis, 1-5 bilateral. No signs of infection. + Improved scaly skin in annular fashion suggestive of tinea. No open lesions or preulcerative lesions present bilateral. Remaining integument unremarkable.  Vasculature:  Dorsalis Pedis pulse 2/4 bilateral. Posterior Tibial pulse  1/4 bilateral.  Capillary fill time <3 sec 1-5 bilateral. Scant hair growth to the level of the digits. Temperature gradient within normal limits. No varicosities  present bilateral. No edema present bilateral.   Neurology: The patient has intact sensation measured with a 5.07/10g Semmes Weinstein Monofilament at all pedal sites bilateral . Vibratory intact diminished bilateral with tuning fork. No Babinski sign present bilateral.   Musculoskeletal: Mild asymptomatic hammertoe pedal deformities noted bilateral. Muscular strength 5/5 in all lower extremity muscular groups bilateral without pain or limitation on range of motion . No tenderness with calf compression bilateral.  Assessment and Plan: Problem List Items Addressed This Visit    None    Visit Diagnoses    Dermatophytosis of nail    -  Primary    Diabetes mellitus without complication (HCC)        Foot pain, bilateral          -Examined patient. -Discussed and educated patient on diabetic foot care, especially with  regards to the vascular, neurological and musculoskeletal systems.  -Stressed the importance of good glycemic control and the detriment of not  controlling glucose levels in relation to the foot. -Mechanically debrided all nails 1-5 bilateral using sterile nail nipper and filed with dremel without incident  -Continue with Lamisil AT cream to use to bottom of both feet daily until completed -Answered all patient questions -Patient to return as needed or in 3 months for at risk foot care -Patient advised to call the office if any problems or questions arise in the  Meantime.  Landis Martins, DPM

## 2016-03-14 ENCOUNTER — Other Ambulatory Visit: Payer: Self-pay | Admitting: Family Medicine

## 2016-04-02 ENCOUNTER — Other Ambulatory Visit: Payer: Self-pay

## 2016-04-02 DIAGNOSIS — C61 Malignant neoplasm of prostate: Secondary | ICD-10-CM

## 2016-04-03 ENCOUNTER — Other Ambulatory Visit: Payer: Medicare Other

## 2016-04-03 DIAGNOSIS — C61 Malignant neoplasm of prostate: Secondary | ICD-10-CM | POA: Diagnosis not present

## 2016-04-10 ENCOUNTER — Encounter: Payer: Self-pay | Admitting: Urology

## 2016-04-10 ENCOUNTER — Ambulatory Visit (INDEPENDENT_AMBULATORY_CARE_PROVIDER_SITE_OTHER): Payer: Medicare Other | Admitting: Urology

## 2016-04-10 VITALS — BP 126/82 | HR 67 | Ht 69.0 in | Wt 138.7 lb

## 2016-04-10 DIAGNOSIS — N529 Male erectile dysfunction, unspecified: Secondary | ICD-10-CM | POA: Diagnosis not present

## 2016-04-10 DIAGNOSIS — C61 Malignant neoplasm of prostate: Secondary | ICD-10-CM

## 2016-04-10 DIAGNOSIS — N138 Other obstructive and reflux uropathy: Secondary | ICD-10-CM

## 2016-04-10 DIAGNOSIS — N401 Enlarged prostate with lower urinary tract symptoms: Secondary | ICD-10-CM

## 2016-04-10 MED ORDER — SILDENAFIL CITRATE 100 MG PO TABS: 100.0000 mg | ORAL_TABLET | Freq: Every day | ORAL | Status: DC | PRN

## 2016-04-10 NOTE — Progress Notes (Addendum)
9:07 AM  04/10/2016   Austin Hunt 01-25-1941 [id number]  Referring provider: Ashok Norris, MD [address] [place], [place] [address]  Chief Complaint  Patient presents with  . Follow-up    prostate cancer, BPH    HPI:  75 year old male with a history of BPH and incidental prostate cancer. He underwent TURP on 08/27/2014 no longer on Flomax and dutasteride. Prior to surgery, he did have a history of elevated postvoid residuals as well as bladder stones.  Pathology at the time of TURP showed incidental small focus of Gleason 3+3 prostate cancer. His PSA at the time of diagnosis was 0.7 on dutasteride.  No voiding complaints today.  IPSS as below.   He is no longer taking any BPH medications and quite pleased.    PVR on previous visits less than 100 cc post op.  He returns today for rectal exam/PSA as well as reassessment of his voiding symptoms.       IPSS      04/10/16 0900       International Prostate Symptom Score   How often have you had the sensation of not emptying your bladder? Less than 1 in 5     How often have you had to urinate less than every two hours? Less than half the time     How often have you found you stopped and started again several times when you urinated? Less than 1 in 5 times     How often have you found it difficult to postpone urination? Less than half the time     How often have you had a weak urinary stream? Less than half the time     How often have you had to strain to start urination? Less than 1 in 5 times     How many times did you typically get up at night to urinate? 1 Time     Total IPSS Score 10     Quality of Life due to urinary symptoms   If you were to spend the rest of your life with your urinary condition just the way it is now how would you feel about that? Pleased         PMH: Past Medical History  Diagnosis Date  . Diabetes mellitus without complication ([place])   . Glaucoma   . BPH (benign prostatic  hypertrophy) with urinary obstruction   . Prostate cancer ([place])   . 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 Nocturia   . Balanitis   . Headache     rare - 1x/mo  . Arthritis     neck - no limitations    Surgical History: Past Surgical History  Procedure Laterality Date  . Appendectomy  1954  . Eye surgery  2011, 2013    cataracts  . Eye surgery Right May 2016    glaucoma  . Bladder surgery  Oct 2015  . Prostate surgery  Oct 2015  . Colonoscopy    . Hernia repair Left 1967  . Hernia repair Right 05-10-14    Right femoral hernia repair Dr Bary Castilla with PerFix plug, no onlay mesh..   . Cardiac catheterization  2002    1 stent  . Photocoagulation with laser Right 12/30/2015    Procedure: PHOTOCOAGULATION WITH LASER;  Surgeon: Ronnell Freshwater, MD;  Location: [facility];  Service: Ophthalmology;  Laterality: Right;  DIABETIC - oral meds IVA BLOCK    Home Medications:    Medication List  This list is accurate as of: 04/10/16  9:07 AM.  Always use your most recent med list.               ACCU-CHEK SOFTCLIX LANCETS lancets  USE TO TEST BLOOD SUGAR TWICE DAILY.     ALTACE 2.5 MG capsule  Generic drug:  ramipril  TAKE 1 CAPSULE BY MOUTH ONCE EVERY OTHERDAY.     aspirin 81 MG tablet  Take 81 mg by mouth daily.     COMBIGAN 0.2-0.5 % ophthalmic solution  Generic drug:  brimonidine-timolol  Place 1 drop into both eyes every 12 (twelve) hours.     dorzolamide 2 % ophthalmic solution  Commonly known as:  TRUSOPT     Garlic 10 MG Caps  Take by mouth.     glipiZIDE 5 MG tablet  Commonly known as:  GLUCOTROL  TAKE 1/2 TO 1 TABLET BY MOUTH TWICE DAILY BEFORE A MEAL     glucose blood test strip  Commonly known as:  ACCU-CHEK AVIVA PLUS  Use as instructed     lubiprostone 24 MCG capsule  Commonly known as:  AMITIZA  Take 1 capsule (24 mcg total) by mouth 2 (two) times daily with a meal.     metFORMIN 500 MG 24 hr tablet  Commonly known as:  GLUCOPHAGE-XR  TAKE  1 TABLET BY MOUTH EVERY MORNING AND2 TABLETS EVERY EVENING     MIRALAX PO  Take by mouth as needed.     multivitamin tablet  Take 1 tablet by mouth daily.     OMEGA-3 FATTY ACIDS PO  Take 1 tablet by mouth daily.     predniSONE 5 MG tablet  Commonly known as:  DELTASONE  Take 3 tablets (15 mg total) by mouth daily. Reported on 01/22/2016     travoprost (benzalkonium) 0.004 % ophthalmic solution  Commonly known as:  TRAVATAN  1 drop at bedtime.        Allergies:  Allergies  Allergen Reactions  . Penicillins Itching  . Statins Other (See Comments)    myalgia  . Welchol [Colesevelam Hcl]     Family History: Family History  Problem Relation Age of Onset  . Prostate cancer Neg Hx   . Bladder Cancer Neg Hx     Social History:  reports that he has never smoked. He has never used smokeless tobacco. He reports that he does not drink alcohol or use illicit drugs.  ROS: UROLOGY Frequent Urination?: No Hard to postpone urination?: No Burning/pain with urination?: No Get up at night to urinate?: No Leakage of urine?: No Urine stream starts and stops?: No Trouble starting stream?: No Do you have to strain to urinate?: No Blood in urine?: No Urinary tract infection?: No Sexually transmitted disease?: No Injury to kidneys or bladder?: No Painful intercourse?: No Weak stream?: No Erection problems?: No Penile pain?: No  Gastrointestinal Nausea?: No Vomiting?: No Indigestion/heartburn?: No Diarrhea?: No Constipation?: No  Constitutional Fever: No Night sweats?: No Weight loss?: No Fatigue?: No  Skin Skin rash/lesions?: No Itching?: No  Eyes Blurred vision?: No Double vision?: No  Ears/Nose/Throat Sore throat?: No Sinus problems?: No  Hematologic/Lymphatic Swollen glands?: No Easy bruising?: No  Cardiovascular Leg swelling?: No Chest pain?: No  Respiratory Cough?: No Shortness of breath?: No  Endocrine Excessive thirst?:  No  Musculoskeletal Back pain?: No Joint pain?: No  Neurological Headaches?: No Dizziness?: No  Psychologic Depression?: No Anxiety?: No  Physical Exam: BP 126/82 mmHg  Pulse 67  Ht 5'  9" (1.753 m)  Wt 138 lb 11.2 oz (62.914 kg)  BMI 20.47 kg/m2  Constitutional:  Alert and oriented, No acute distress. HEENT: [place] AT, moist mucus membranes.  Trachea midline, no masses. Cardiovascular: No clubbing, cyanosis, or edema. Respiratory: Normal respiratory effort, no increased work of breathing. GI: Abdomen is soft, nontender, nondistended, no abdominal masses GU: No CVA tenderness.  Rectal exam: Mildly decreased sphincter tone. Normal prostate, nontender, 30 cc gland, no nodules.   Skin: No rashes, bruises or suspicious lesions. Neurologic: Grossly intact, no focal deficits, moving all 4 extremities. Psychiatric: Normal mood and affect.  Laboratory Data: Lab Results  Component Value Date   WBC 2.2* 12/19/2015   HGB 13.3 08/28/2014   HCT 43.8 12/19/2015   MCV 87 12/19/2015   PLT 197 12/19/2015    Lab Results  Component Value Date   CREATININE 0.93 12/19/2015    Lab Results  Component Value Date   HGBA1C 8.1* 12/19/2015   Component     Latest Ref Rng 10/04/2015 04/03/2016  PSA     0.0 - 4.0 ng/mL 1.1 1.2    Pertinent Imaging: n/a  Assessment & Plan:    1. Prostate cancer [facility]) Incidental small focus of Gleason 3+3 prostate cancer for TURP 08/2014, iPSA 0.7 on dutasteride.  Anticipated rise to 1.2 off meds. Recommend continued active surveillance given age and pathology.  Rectal exam today remains benign. - PSA/ DRE q6 months  2. BPH with urinary obstruction Symptoms minimal, on no medications. Overall doing very well. - Urinalysis, Complete - BLADDER SCAN AMB NON-IMAGING   Return in about 6 months (around 10/10/2016) for DRE/ PSA.  Hollice Espy, MD  [facility] [address] [place], [place] [address] [phone]   Addendum: Patient stopped following his apt mentioning that the Viagra does not seem to be working.  We reviewed how to use the medication properly.  He will try it again (Viagra 100 mg), script sent to pharmacy.  If it does not work, he will call us to arrange for infection teaching.

## 2016-04-10 NOTE — Addendum Note (Signed)
Addended by: Hollice Espy on: 04/10/2016 09:47 AM   Modules accepted: Orders

## 2016-04-15 DIAGNOSIS — R1011 Right upper quadrant pain: Secondary | ICD-10-CM | POA: Diagnosis not present

## 2016-04-15 DIAGNOSIS — G8929 Other chronic pain: Secondary | ICD-10-CM | POA: Diagnosis not present

## 2016-04-15 DIAGNOSIS — M810 Age-related osteoporosis without current pathological fracture: Secondary | ICD-10-CM | POA: Diagnosis not present

## 2016-04-15 DIAGNOSIS — Z7952 Long term (current) use of systemic steroids: Secondary | ICD-10-CM | POA: Diagnosis not present

## 2016-04-15 DIAGNOSIS — M353 Polymyalgia rheumatica: Secondary | ICD-10-CM | POA: Diagnosis not present

## 2016-04-16 ENCOUNTER — Other Ambulatory Visit: Payer: Self-pay | Admitting: Internal Medicine

## 2016-04-16 DIAGNOSIS — R1011 Right upper quadrant pain: Secondary | ICD-10-CM

## 2016-04-21 ENCOUNTER — Ambulatory Visit
Admission: RE | Admit: 2016-04-21 | Discharge: 2016-04-21 | Disposition: A | Discharge status: Home / Self Care | Payer: Medicare Other | Source: Ambulatory Visit | Attending: Internal Medicine | Admitting: Internal Medicine

## 2016-04-21 DIAGNOSIS — K824 Cholesterolosis of gallbladder: Secondary | ICD-10-CM | POA: Diagnosis not present

## 2016-04-21 DIAGNOSIS — R1011 Right upper quadrant pain: Secondary | ICD-10-CM | POA: Insufficient documentation

## 2016-04-22 DIAGNOSIS — H401133 Primary open-angle glaucoma, bilateral, severe stage: Secondary | ICD-10-CM | POA: Diagnosis not present

## 2016-04-27 ENCOUNTER — Other Ambulatory Visit: Payer: Self-pay | Admitting: Family Medicine

## 2016-04-27 NOTE — Telephone Encounter (Signed)
Patient requesting refill. 

## 2016-05-13 LAB — PSA: Prostate Specific Ag, Serum: 1.2 ng/mL (ref 0.0–4.0)

## 2016-05-19 DIAGNOSIS — M5030 Other cervical disc degeneration, unspecified cervical region: Secondary | ICD-10-CM | POA: Diagnosis not present

## 2016-05-19 DIAGNOSIS — M9901 Segmental and somatic dysfunction of cervical region: Secondary | ICD-10-CM | POA: Diagnosis not present

## 2016-05-19 DIAGNOSIS — R51 Headache: Secondary | ICD-10-CM | POA: Diagnosis not present

## 2016-05-19 DIAGNOSIS — M5413 Radiculopathy, cervicothoracic region: Secondary | ICD-10-CM | POA: Diagnosis not present

## 2016-05-20 DIAGNOSIS — R51 Headache: Secondary | ICD-10-CM | POA: Diagnosis not present

## 2016-05-20 DIAGNOSIS — M5413 Radiculopathy, cervicothoracic region: Secondary | ICD-10-CM | POA: Diagnosis not present

## 2016-05-20 DIAGNOSIS — M5030 Other cervical disc degeneration, unspecified cervical region: Secondary | ICD-10-CM | POA: Diagnosis not present

## 2016-05-20 DIAGNOSIS — M9901 Segmental and somatic dysfunction of cervical region: Secondary | ICD-10-CM | POA: Diagnosis not present

## 2016-05-22 DIAGNOSIS — M5030 Other cervical disc degeneration, unspecified cervical region: Secondary | ICD-10-CM | POA: Diagnosis not present

## 2016-05-22 DIAGNOSIS — M9901 Segmental and somatic dysfunction of cervical region: Secondary | ICD-10-CM | POA: Diagnosis not present

## 2016-05-22 DIAGNOSIS — M5413 Radiculopathy, cervicothoracic region: Secondary | ICD-10-CM | POA: Diagnosis not present

## 2016-05-22 DIAGNOSIS — R51 Headache: Secondary | ICD-10-CM | POA: Diagnosis not present

## 2016-05-27 DIAGNOSIS — R51 Headache: Secondary | ICD-10-CM | POA: Diagnosis not present

## 2016-05-27 DIAGNOSIS — M5413 Radiculopathy, cervicothoracic region: Secondary | ICD-10-CM | POA: Diagnosis not present

## 2016-05-27 DIAGNOSIS — M5030 Other cervical disc degeneration, unspecified cervical region: Secondary | ICD-10-CM | POA: Diagnosis not present

## 2016-05-27 DIAGNOSIS — M9901 Segmental and somatic dysfunction of cervical region: Secondary | ICD-10-CM | POA: Diagnosis not present

## 2016-06-01 ENCOUNTER — Other Ambulatory Visit: Payer: Self-pay | Admitting: Family Medicine

## 2016-06-03 DIAGNOSIS — M5030 Other cervical disc degeneration, unspecified cervical region: Secondary | ICD-10-CM | POA: Diagnosis not present

## 2016-06-03 DIAGNOSIS — M9901 Segmental and somatic dysfunction of cervical region: Secondary | ICD-10-CM | POA: Diagnosis not present

## 2016-06-03 DIAGNOSIS — M5413 Radiculopathy, cervicothoracic region: Secondary | ICD-10-CM | POA: Diagnosis not present

## 2016-06-03 DIAGNOSIS — R51 Headache: Secondary | ICD-10-CM | POA: Diagnosis not present

## 2016-06-04 ENCOUNTER — Telehealth: Payer: Self-pay | Admitting: Family Medicine

## 2016-06-05 ENCOUNTER — Other Ambulatory Visit: Payer: Self-pay | Admitting: Family Medicine

## 2016-06-05 DIAGNOSIS — R51 Headache: Secondary | ICD-10-CM | POA: Diagnosis not present

## 2016-06-05 DIAGNOSIS — M5030 Other cervical disc degeneration, unspecified cervical region: Secondary | ICD-10-CM | POA: Diagnosis not present

## 2016-06-05 DIAGNOSIS — M5413 Radiculopathy, cervicothoracic region: Secondary | ICD-10-CM | POA: Diagnosis not present

## 2016-06-05 DIAGNOSIS — M9901 Segmental and somatic dysfunction of cervical region: Secondary | ICD-10-CM | POA: Diagnosis not present

## 2016-06-05 MED ORDER — ALTACE 2.5 MG PO CAPS: ORAL_CAPSULE | ORAL | 2 refills | Status: DC

## 2016-06-05 NOTE — Telephone Encounter (Signed)
Patient requesting refill of Altace medication before going out of town.

## 2016-06-16 ENCOUNTER — Encounter: Payer: Self-pay | Admitting: Sports Medicine

## 2016-06-16 ENCOUNTER — Ambulatory Visit (INDEPENDENT_AMBULATORY_CARE_PROVIDER_SITE_OTHER): Payer: Medicare Other | Admitting: Sports Medicine

## 2016-06-16 DIAGNOSIS — M79671 Pain in right foot: Secondary | ICD-10-CM | POA: Diagnosis not present

## 2016-06-16 DIAGNOSIS — B351 Tinea unguium: Secondary | ICD-10-CM | POA: Diagnosis not present

## 2016-06-16 DIAGNOSIS — E119 Type 2 diabetes mellitus without complications: Secondary | ICD-10-CM

## 2016-06-16 DIAGNOSIS — M79672 Pain in left foot: Secondary | ICD-10-CM

## 2016-06-16 NOTE — Progress Notes (Signed)
Patient ID: HUY KRIES, male   DOB: 1941-06-03, 75 y.o.   MRN: [id number]  Subjective: Austin Hunt is a 75 y.o. male patient with history of type 2 diabetes who presents to office today complaining of long, painful nails  while ambulating in shoes; unable to trim. Patient states that the glucose reading this morning was good. Lamisil cream completed with no issues. Patient denies any new changes in medication or new problems. Patient denies any new cramping, numbness, burning or tingling in the legs.  Patient Active Problem List   Diagnosis Date Noted  . Long term current use of systemic steroids 01/07/2016  . Polymyalgia rheumatica ([place]) 12/24/2015  . Right groin pain 04/19/2015  . Anxiety 04/08/2015  . 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 Chronic constipation 04/08/2015  . 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 Diabetes mellitus type 2, uncontrolled, without complications ([place]) 123456  . Failure of erection 04/08/2015  . H/O inguinal hernia repair 04/08/2015  . HLD (hyperlipidemia) 04/08/2015  . 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 LBP (low back pain) 04/08/2015  . 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 Drug intolerance 04/08/2015  . Kidney lump 04/08/2015  . TI (tricuspid incompetence) 04/08/2015  . Unilateral recurrent femoral hernia without obstruction or gangrene 05/23/2014  . Femoral hernia 05/23/2014  . Right inguinal hernia 02/06/2014  . Cataract 01/16/2014  . Glaucoma 01/16/2014  . Reflux esophagitis 06/21/2009  . Coronary atherosclerosis 04/03/2009  . Benign enlargement of prostate 02/11/2009  . Decreased libido 01/12/2008  . Benign essential HTN 09/12/2007   Current Outpatient Prescriptions on File Prior to Visit  Medication Sig Dispense Refill  . ACCU-CHEK SOFTCLIX LANCETS lancets USE TO TEST BLOOD SUGAR TWICE DAILY. 100 each 11  . ALTACE 2.5 MG capsule TAKE 1 CAPSULE BY MOUTH ONCE EVERY OTHERDAY. 45 capsule 2  . aspirin 81 MG tablet Take 81 mg by mouth  daily.    . brimonidine-timolol (COMBIGAN) 0.2-0.5 % ophthalmic solution Place 1 drop into both eyes every 12 (twelve) hours.    . calcium carbonate (OS-CAL - DOSED IN MG OF ELEMENTAL CALCIUM) 1250 (500 Ca) MG tablet Take by mouth.    . dorzolamide (TRUSOPT) 2 % ophthalmic solution     . Garlic 10 MG CAPS Take by mouth.    Marland Kitchen glipiZIDE (GLUCOTROL) 5 MG tablet TAKE 1/2 TO 1 TABLET BY MOUTH TWICE DAILY BEFORE A MEAL 60 tablet 1  . glucose blood (ACCU-CHEK AVIVA PLUS) test strip Use as instructed 100 each 11  . lubiprostone (AMITIZA) 24 MCG capsule Take 1 capsule (24 mcg total) by mouth 2 (two) times daily with a meal. 60 capsule 2  . metFORMIN (GLUCOPHAGE-XR) 500 MG 24 hr tablet TAKE 1 TABLET BY MOUTH EVERY MORNING AND2 TABLETS EVERY EVENING 270 tablet 0  . Multiple Vitamin (MULTIVITAMIN) tablet Take 1 tablet by mouth daily.    . OMEGA-3 FATTY ACIDS PO Take 1 tablet by mouth daily.    . Polyethylene Glycol 3350 (MIRALAX PO) Take by mouth as needed.    . predniSONE (DELTASONE) 5 MG tablet Take 3 tablets (15 mg total) by mouth daily. Reported on 01/22/2016 90 tablet 0  . sildenafil (VIAGRA) 100 MG tablet Take 1 tablet (100 mg total) by mouth daily as needed for erectile dysfunction. 6 tablet 6  . travoprost, benzalkonium, (TRAVATAN) 0.004 % ophthalmic solution 1 drop at bedtime.     No current facility-administered medications on file prior to visit.    Allergies  Allergen Reactions  . Penicillins Itching  and Other (See Comments)  . Statins Other (See Comments)    myalgia  . Welchol [Colesevelam Hcl]     Objective: General: Patient is awake, alert, and oriented x 3 and in no acute distress.  Integument: Skin is warm, dry and supple bilateral. Nails are tender, long, thickened and  dystrophic with subungual debris, consistent with onychomycosis, 1-5 bilateral. No signs of infection. + Improved scaly skin in annular fashion suggestive of tinea. No open lesions or preulcerative lesions present  bilateral. Remaining integument unremarkable.  Vasculature:  Dorsalis Pedis pulse 2/4 bilateral. Posterior Tibial pulse  1/4 bilateral.  Capillary fill time <3 sec 1-5 bilateral. Scant hair growth to the level of the digits. Temperature gradient within normal limits. No varicosities present bilateral. No edema present bilateral.   Neurology: The patient has intact sensation measured with a 5.07/10g Semmes Weinstein Monofilament at all pedal sites bilateral . Vibratory intact diminished bilateral with tuning fork. No Babinski sign present bilateral.   Musculoskeletal: Mild asymptomatic hammertoe pedal deformities noted bilateral. Muscular strength 5/5 in all lower extremity muscular groups bilateral without pain or limitation on range of motion . No tenderness with calf compression bilateral.  Assessment and Plan: Problem List Items Addressed This Visit    None    Visit Diagnoses    Dermatophytosis of nail    -  Primary   Diabetes mellitus without complication (HCC)       Foot pain, bilateral         -Examined patient. -Discussed and educated patient on diabetic foot care, especially with  regards to the vascular, neurological and musculoskeletal systems.  -Stressed the importance of good glycemic control and the detriment of not controlling glucose levels in relation to the foot. -Mechanically debrided all nails 1-5 bilateral using sterile nail nipper and filed with dremel without incident  -Lamisil AT completed -Answered all patient questions -Patient to return as needed or in 3 months for at risk foot care -Patient advised to call the office if any problems or questions arise in the  Meantime.  Landis Martins, DPM

## 2016-06-17 DIAGNOSIS — M353 Polymyalgia rheumatica: Secondary | ICD-10-CM | POA: Diagnosis not present

## 2016-06-19 ENCOUNTER — Other Ambulatory Visit: Payer: Self-pay | Admitting: Family Medicine

## 2016-06-24 DIAGNOSIS — R51 Headache: Secondary | ICD-10-CM | POA: Diagnosis not present

## 2016-06-24 DIAGNOSIS — M5030 Other cervical disc degeneration, unspecified cervical region: Secondary | ICD-10-CM | POA: Diagnosis not present

## 2016-06-24 DIAGNOSIS — M5413 Radiculopathy, cervicothoracic region: Secondary | ICD-10-CM | POA: Diagnosis not present

## 2016-06-24 DIAGNOSIS — M9901 Segmental and somatic dysfunction of cervical region: Secondary | ICD-10-CM | POA: Diagnosis not present

## 2016-06-25 ENCOUNTER — Other Ambulatory Visit: Payer: Self-pay | Admitting: Family Medicine

## 2016-07-16 DIAGNOSIS — M810 Age-related osteoporosis without current pathological fracture: Secondary | ICD-10-CM | POA: Diagnosis not present

## 2016-07-16 DIAGNOSIS — Z7952 Long term (current) use of systemic steroids: Secondary | ICD-10-CM | POA: Diagnosis not present

## 2016-07-16 DIAGNOSIS — M353 Polymyalgia rheumatica: Secondary | ICD-10-CM | POA: Diagnosis not present

## 2016-07-22 DIAGNOSIS — H401133 Primary open-angle glaucoma, bilateral, severe stage: Secondary | ICD-10-CM | POA: Diagnosis not present

## 2016-07-30 ENCOUNTER — Ambulatory Visit (INDEPENDENT_AMBULATORY_CARE_PROVIDER_SITE_OTHER): Payer: Medicare Other | Admitting: Family Medicine

## 2016-07-30 ENCOUNTER — Encounter: Payer: Self-pay | Admitting: Family Medicine

## 2016-07-30 VITALS — BP 110/60 | HR 69 | Temp 98.0°F | Resp 16 | Ht 69.0 in | Wt 143.1 lb

## 2016-07-30 DIAGNOSIS — N521 Erectile dysfunction due to diseases classified elsewhere: Secondary | ICD-10-CM | POA: Diagnosis not present

## 2016-07-30 DIAGNOSIS — I10 Essential (primary) hypertension: Secondary | ICD-10-CM | POA: Diagnosis not present

## 2016-07-30 DIAGNOSIS — M353 Polymyalgia rheumatica: Secondary | ICD-10-CM

## 2016-07-30 DIAGNOSIS — Z23 Encounter for immunization: Secondary | ICD-10-CM | POA: Diagnosis not present

## 2016-07-30 DIAGNOSIS — D72819 Decreased white blood cell count, unspecified: Secondary | ICD-10-CM | POA: Diagnosis not present

## 2016-07-30 DIAGNOSIS — E1165 Type 2 diabetes mellitus with hyperglycemia: Secondary | ICD-10-CM

## 2016-07-30 DIAGNOSIS — K5909 Other constipation: Secondary | ICD-10-CM

## 2016-07-30 DIAGNOSIS — K5900 Constipation, unspecified: Secondary | ICD-10-CM | POA: Diagnosis not present

## 2016-07-30 DIAGNOSIS — E785 Hyperlipidemia, unspecified: Secondary | ICD-10-CM | POA: Diagnosis not present

## 2016-07-30 DIAGNOSIS — H409 Unspecified glaucoma: Secondary | ICD-10-CM | POA: Diagnosis not present

## 2016-07-30 DIAGNOSIS — E1140 Type 2 diabetes mellitus with diabetic neuropathy, unspecified: Secondary | ICD-10-CM

## 2016-07-30 DIAGNOSIS — H42 Glaucoma in diseases classified elsewhere: Secondary | ICD-10-CM

## 2016-07-30 DIAGNOSIS — E1139 Type 2 diabetes mellitus with other diabetic ophthalmic complication: Secondary | ICD-10-CM | POA: Insufficient documentation

## 2016-07-30 DIAGNOSIS — IMO0002 Reserved for concepts with insufficient information to code with codable children: Secondary | ICD-10-CM | POA: Insufficient documentation

## 2016-07-30 LAB — POCT GLYCOSYLATED HEMOGLOBIN (HGB A1C): Hemoglobin A1C: 9.4

## 2016-07-30 MED ORDER — GLIPIZIDE 5 MG PO TABS: 5.0000 mg | ORAL_TABLET | Freq: Two times a day (BID) | ORAL | 3 refills | Status: DC

## 2016-07-30 MED ORDER — METFORMIN HCL ER 500 MG PO TB24: ORAL_TABLET | ORAL | 3 refills | Status: DC

## 2016-07-30 NOTE — Progress Notes (Signed)
Name: Austin Hunt   MRN: [id number]    DOB: [birth year]   Date:07/30/2016       Progress Note  Subjective  Chief Complaint  Chief Complaint  Patient presents with  . Medication Refill    HPI  PMR: he states that he was feeling fine on Saturday Feb 11th and woke up on Sunday the 12 th of 2017  feeling stiff on his hips and with aching sensation on both shoulders, both hips, lower back and both legs were feeling very tired. He lost his appetite but blood sugar was higher than usual.  He was started on prednisone on 12/20/2015 and is now seeing Dr Meda Coffee since March.   He is down to 6 mg of prednisone per day and pain is under control, currently no pain, he still wakes up feeling a little stiff.   DMII: glucose at home is only being checked fasting and is under 120. He denies polyphagia, polydipsia or polyuria. Vision is better since he had glaucoma surgery this past Spring. He also has ED and sees Urologist but states Viagra did not improve symptoms so he stopped the medication. Weight has been stable. Nocturia at most once per night.   Leukopenia: WBC was low , but rechecked by Rheumatologist and it was back to normal, we will recheck before his next visitt  HTN: patient is taking medication and denies side effects of medication  IBS with constipation: taking Amitiza occasionally and has to strain, advised him to take it daily     Patient Active Problem List   Diagnosis Date Noted  . 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 Long term current use of systemic steroids 01/07/2016  . Neutropenia ([place]) 01/07/2016  . Polymyalgia rheumatica ([place]) 12/24/2015  . Anxiety 04/08/2015  . 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 Chronic constipation 04/08/2015  . 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 Diabetes mellitus type 2, uncontrolled, without complications ([place]) 123456  . Failure of erection 04/08/2015  . H/O inguinal hernia repair 04/08/2015  .  HLD (hyperlipidemia) 04/08/2015  . 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 LBP (low back pain) 04/08/2015  . 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 Drug intolerance 04/08/2015  . Kidney lump 04/08/2015  . TI (tricuspid incompetence) 04/08/2015  . Unilateral recurrent femoral hernia without obstruction or gangrene 05/23/2014  . Femoral hernia 05/23/2014  . Right inguinal hernia 02/06/2014  . Cataract 01/16/2014  . Glaucoma 01/16/2014  . Reflux esophagitis 06/21/2009  . Coronary atherosclerosis 04/03/2009  . Benign enlargement of prostate 02/11/2009  . Decreased libido 01/12/2008  . Benign essential HTN 09/12/2007    Past Surgical History:  Procedure Laterality Date  . APPENDECTOMY  1954  . BLADDER SURGERY  Oct 2015  . CARDIAC CATHETERIZATION  2002   1 stent  . COLONOSCOPY    . EYE SURGERY  2011, 2013   cataracts  . EYE SURGERY Right May 2016   glaucoma  . HERNIA REPAIR Left 1967  . HERNIA REPAIR Right 05-10-14   Right femoral hernia repair Dr Bary Castilla with PerFix plug, no onlay mesh..   . PHOTOCOAGULATION WITH LASER Right 12/30/2015   Procedure: PHOTOCOAGULATION WITH LASER;  Surgeon: Ronnell Freshwater, MD;  Location: [facility];  Service: Ophthalmology;  Laterality: Right;  DIABETIC - oral meds IVA BLOCK  . PROSTATE SURGERY  Oct 2015    Family History  Problem Relation Age of Onset  . Prostate cancer Neg Hx   . Bladder Cancer Neg Hx  Social History   Social History  . Marital status: Married    Spouse name: N/A  . Number of children: N/A  . Years of education: N/A   Occupational History  . Not on file.   Social History Main Topics  . Smoking status: Never Smoker  . Smokeless tobacco: Never Used  . Alcohol use No  . Drug use: No  . Sexual activity: Not on file   Other Topics Concern  . Not on file   Social History Narrative  . No narrative on file     Current Outpatient Prescriptions:  .  ACCU-CHEK SOFTCLIX LANCETS lancets, USE TO TEST BLOOD  SUGAR TWICE DAILY., Disp: 100 each, Rfl: 11 .  ALTACE 2.5 MG capsule, TAKE 1 CAPSULE BY MOUTH ONCE EVERY OTHERDAY., Disp: 45 capsule, Rfl: 2 .  aspirin 81 MG tablet, Take 81 mg by mouth daily., Disp: , Rfl:  .  brimonidine-timolol (COMBIGAN) 0.2-0.5 % ophthalmic solution, Place 1 drop into both eyes every 12 (twelve) hours., Disp: , Rfl:  .  calcium carbonate (OS-CAL - DOSED IN MG OF ELEMENTAL CALCIUM) 1250 (500 Ca) MG tablet, Take by mouth., Disp: , Rfl:  .  dorzolamide (TRUSOPT) 2 % ophthalmic solution, , Disp: , Rfl:  .  Garlic 10 MG CAPS, Take by mouth., Disp: , Rfl:  .  glipiZIDE (GLUCOTROL) 5 MG tablet, Take 1-2 tablets (5-10 mg total) by mouth 2 (two) times daily with a meal. 10 mg in am and 5 mg in pm, Disp: 90 tablet, Rfl: 3 .  glucose blood (ACCU-CHEK AVIVA PLUS) test strip, Use as instructed, Disp: 100 each, Rfl: 11 .  lubiprostone (AMITIZA) 24 MCG capsule, Take 1 capsule (24 mcg total) by mouth 2 (two) times daily with a meal., Disp: 60 capsule, Rfl: 2 .  metFORMIN (GLUCOPHAGE-XR) 500 MG 24 hr tablet, TAKE 1 TABLET BY MOUTH EVERY MORNING AND 2 TABLETS EVERY EVENING, Disp: 90 tablet, Rfl: 3 .  Multiple Vitamin (MULTIVITAMIN) tablet, Take 1 tablet by mouth daily., Disp: , Rfl:  .  OMEGA-3 FATTY ACIDS PO, Take 1 tablet by mouth daily., Disp: , Rfl:  .  travoprost, benzalkonium, (TRAVATAN) 0.004 % ophthalmic solution, 1 drop at bedtime., Disp: , Rfl:   Allergies  Allergen Reactions  . Penicillins Itching and Other (See Comments)  . Statins Other (See Comments)    myalgia  . Welchol [Colesevelam Hcl]      ROS  Constitutional: Negative for fever or weight change.  Respiratory: Negative for cough and shortness of breath.   Cardiovascular: Negative for chest pain or palpitations.  Gastrointestinal: Negative for abdominal pain, no bowel changes.  Musculoskeletal: Negative for gait problem or joint swelling.  Skin: Negative for rash.  Neurological: Negative for dizziness or  headache.  No other specific complaints in a complete review of systems (except as listed in HPI above).  Objective  Vitals:   07/30/16 1249  BP: 110/60  Pulse: 69  Resp: 16  Temp: 98 F (36.7 C)  SpO2: 96%  Weight: 143 lb 1 oz (64.9 kg)  Height: 5\' 9"  (1.753 m)    Body mass index is 21.13 kg/m.  Physical Exam  Constitutional: Patient appears well-developed and well-nourished.  No distress.  HEENT: head atraumatic, normocephalic, pupils equal and reactive to light, neck supple, throat within normal limits Cardiovascular: Normal rate, regular rhythm and normal heart sounds.  No murmur heard. No BLE edema. Pulmonary/Chest: Effort normal and breath sounds normal. No respiratory distress. Abdominal: Soft.  There  is no tenderness. Psychiatric: Patient has a normal mood and affect. behavior is normal. Judgment and thought content normal. Muscular skeletal: no synovitis, no pain over temporal arteries, normal rom of both shoulders  Recent Results (from the past 2160 hour(s))  POCT HgB A1C     Status: None   Collection Time: 07/30/16 12:57 PM  Result Value Ref Range   Hemoglobin A1C 9.4       PHQ2/9: Depression screen [facility] 2/9 02/28/2016 01/22/2016 12/24/2015 12/19/2015 08/14/2015  Decreased Interest 0 0 0 0 0  Down, Depressed, Hopeless 0 0 0 0 0  PHQ - 2 Score 0 0 0 0 0     Fall Risk: Fall Risk  02/28/2016 01/22/2016 12/24/2015 12/19/2015 08/14/2015  Falls in the past year? No No No No No     Assessment & Plan  1. 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 He will resume a diabetic diet, eating a lot sweets daily, he is going down on prednisone dose and it may also help  - POCT HgB A1C - glipiZIDE (GLUCOTROL) 5 MG tablet; Take 1-2 tablets (5-10 mg total) by mouth 2 (two) times daily with a meal. 10 mg in am and 5 mg in  pm  Dispense: 90 tablet; Refill: 3 - metFORMIN (GLUCOPHAGE-XR) 500 MG 24 hr tablet; TAKE 1 TABLET BY MOUTH EVERY MORNING AND 2 TABLETS EVERY EVENING  Dispense: 90 tablet; Refill: 3  2. Need for influenza vaccination  - Flu vaccine HIGH DOSE PF (Fluzone High dose)  3. Polymyalgia rheumatica (H[facility])  Continue follow up with Dr. Meda Coffee  4. Essential hypertension  - COMPLETE METABOLIC PANEL WITH GFR  5. Leukopenia  - CBC with Differential/Platelet  6. 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 10 mg in am and 5 mg in pm  Dispense: 90 tablet; Refill: 3 - metFORMIN (GLUCOPHAGE-XR) 500 MG 24 hr tablet; TAKE 1 TABLET BY MOUTH EVERY MORNING AND 2 TABLETS EVERY EVENING  Dispense: 90 tablet; Refill: 3  7. Chronic constipation  Needs to take Amitiza on a regular basis, instead of prn   8. Dyslipidemia  - Lipid panel

## 2016-08-10 DIAGNOSIS — R221 Localized swelling, mass and lump, neck: Secondary | ICD-10-CM | POA: Diagnosis not present

## 2016-08-27 ENCOUNTER — Encounter: Payer: Medicare Other | Admitting: Family Medicine

## 2016-08-27 ENCOUNTER — Other Ambulatory Visit: Payer: Self-pay | Admitting: Family Medicine

## 2016-08-27 DIAGNOSIS — D72819 Decreased white blood cell count, unspecified: Secondary | ICD-10-CM | POA: Diagnosis not present

## 2016-08-27 DIAGNOSIS — I10 Essential (primary) hypertension: Secondary | ICD-10-CM | POA: Diagnosis not present

## 2016-08-27 DIAGNOSIS — E785 Hyperlipidemia, unspecified: Secondary | ICD-10-CM | POA: Diagnosis not present

## 2016-08-27 LAB — CBC WITH DIFFERENTIAL/PLATELET
Basophils Absolute: 0 cells/uL (ref 0–200)
Basophils Relative: 0 %
Eosinophils Absolute: 296 cells/uL (ref 15–500)
Eosinophils Relative: 8 %
HCT: 47.3 % (ref 38.5–50.0)
Hemoglobin: 15.8 g/dL (ref 13.2–17.1)
Lymphocytes Relative: 43 %
Lymphs Abs: 1591 cells/uL (ref 850–3900)
MCH: 29.7 pg (ref 27.0–33.0)
MCHC: 33.4 g/dL (ref 32.0–36.0)
MCV: 88.9 fL (ref 80.0–100.0)
MPV: 9 fL (ref 7.5–12.5)
Monocytes Absolute: 259 cells/uL (ref 200–950)
Monocytes Relative: 7 %
Neutro Abs: 1554 cells/uL (ref 1500–7800)
Neutrophils Relative %: 42 %
Platelets: 209 10*3/uL (ref 140–400)
RBC: 5.32 MIL/uL (ref 4.20–5.80)
RDW: 13.1 % (ref 11.0–15.0)
WBC: 3.7 10*3/uL — ABNORMAL LOW (ref 3.8–10.8)

## 2016-08-27 LAB — COMPLETE METABOLIC PANEL WITH GFR
ALT: 11 U/L (ref 9–46)
AST: 19 U/L (ref 10–35)
Albumin: 4.4 g/dL (ref 3.6–5.1)
Alkaline Phosphatase: 67 U/L (ref 40–115)
BUN: 16 mg/dL (ref 7–25)
CO2: 30 mmol/L (ref 20–31)
Calcium: 9.7 mg/dL (ref 8.6–10.3)
Chloride: 98 mmol/L (ref 98–110)
Creat: 0.95 mg/dL (ref 0.70–1.18)
GFR, Est African American: 89 mL/min (ref >=60)
GFR, Est Non African American: 77 mL/min (ref >=60)
Glucose, Bld: 147 mg/dL — ABNORMAL HIGH (ref 65–99)
Potassium: 4.3 mmol/L (ref 3.5–5.3)
Sodium: 138 mmol/L (ref 135–146)
Total Bilirubin: 0.6 mg/dL (ref 0.2–1.2)
Total Protein: 7.5 g/dL (ref 6.1–8.1)

## 2016-08-27 LAB — LIPID PANEL
Cholesterol: 224 mg/dL — ABNORMAL HIGH (ref 125–200)
HDL: 58 mg/dL (ref >=40)
LDL Cholesterol: 142 mg/dL — ABNORMAL HIGH (ref <130)
Total CHOL/HDL Ratio: 3.9 Ratio (ref <=5.0)
Triglycerides: 118 mg/dL (ref <150)
VLDL: 24 mg/dL (ref <30)

## 2016-08-31 ENCOUNTER — Ambulatory Visit (INDEPENDENT_AMBULATORY_CARE_PROVIDER_SITE_OTHER): Payer: Medicare Other | Admitting: Family Medicine

## 2016-08-31 VITALS — BP 116/70 | HR 64 | Temp 97.1°F | Ht 67.0 in | Wt 137.2 lb

## 2016-08-31 DIAGNOSIS — Z0000 Encounter for general adult medical examination without abnormal findings: Secondary | ICD-10-CM

## 2016-08-31 NOTE — Patient Instructions (Signed)
Mr. Mifsud , Thank you for taking time to come for your Medicare Wellness Visit. I appreciate your ongoing commitment to your health goals. Please review the following plan we discussed and let me know if I can assist you in the future.   These are the goals we discussed: Goals    . Exercise          Starting 08/31/16, I will continue exercising 4 times a week for 40 minutes.       This is a list of the screening recommended for you and due dates:  Health Maintenance  Topic Date Due  . Hemoglobin A1C  01/27/2017  . Complete foot exam   02/27/2017  . Eye exam for diabetics  07/21/2017  . Tetanus Vaccine  11/04/2020  . Flu Shot  Completed  . Shingles Vaccine  Completed  . Pneumonia vaccines  Completed   Preventive Care for Adults  A healthy lifestyle and preventive care can promote health and wellness. Preventive health guidelines for adults include the following key practices.  . A routine yearly physical is a good way to check with your health care provider about your health and preventive screening. It is a chance to share any concerns and updates on your health and to receive a thorough exam.  . Visit your dentist for a routine exam and preventive care every 6 months. Brush your teeth twice a day and floss once a day. Good oral hygiene prevents tooth decay and gum disease.  . The frequency of eye exams is based on your age, health, family medical history, use  of contact lenses, and other factors. Follow your health care provider's ecommendations for frequency of eye exams.  . Eat a healthy diet. Foods like vegetables, fruits, whole grains, low-fat dairy products, and lean protein foods contain the nutrients you need without too many calories. Decrease your intake of foods high in solid fats, added sugars, and salt. Eat the right amount of calories for you. Get information about a proper diet from your health care provider, if necessary.  . Regular physical exercise is one of the  most important things you can do for your health. Most adults should get at least 150 minutes of moderate-intensity exercise (any activity that increases your heart rate and causes you to sweat) each week. In addition, most adults need muscle-strengthening exercises on 2 or more days a week.  Silver Sneakers may be a benefit available to you. To determine eligibility, you may visit the website: www.silversneakers.com or contact program at (608)534-3541 Mon-Fri between 8AM-8PM.   . Maintain a healthy weight. The body mass index (BMI) is a screening tool to identify possible weight problems. It provides an estimate of body fat based on height and weight. Your health care provider can find your BMI and can help you achieve or maintain a healthy weight.   For adults 20 years and older: ? A BMI below 18.5 is considered underweight. ? A BMI of 18.5 to 24.9 is normal. ? A BMI of 25 to 29.9 is considered overweight. ? A BMI of 30 and above is considered obese.   . Maintain normal blood lipids and cholesterol levels by exercising and minimizing your intake of saturated fat. Eat a balanced diet with plenty of fruit and vegetables. Blood tests for lipids and cholesterol should begin at age 87 and be repeated every 5 years. If your lipid or cholesterol levels are high, you are over 50, or you are at high risk for heart  disease, you may need your cholesterol levels checked more frequently. Ongoing high lipid and cholesterol levels should be treated with medicines if diet and exercise are not working.  . If you smoke, find out from your health care provider how to quit. If you do not use tobacco, please do not start.  . If you choose to drink alcohol, please do not consume more than 2 drinks per day. One drink is considered to be 12 ounces (355 mL) of beer, 5 ounces (148 mL) of wine, or 1.5 ounces (44 mL) of liquor.  . If you are 41-16 years old, ask your health care provider if you should take aspirin to  prevent strokes.  . Use sunscreen. Apply sunscreen liberally and repeatedly throughout the day. You should seek shade when your shadow is shorter than you. Protect yourself by wearing long sleeves, pants, a wide-brimmed hat, and sunglasses year round, whenever you are outdoors.  . Once a month, do a whole body skin exam, using a mirror to look at the skin on your back. Tell your health care provider of new moles, moles that have irregular borders, moles that are larger than a pencil eraser, or moles that have changed in shape or color.

## 2016-08-31 NOTE — Progress Notes (Signed)
Subjective:   Austin Hunt is a 75 y.o. male who presents for Medicare Annual/Subsequent preventive examination.  Review of Systems:  N/A Cardiac Risk Factors include: advanced age (>37men, >28 women);dyslipidemia;diabetes mellitus;male gender     Objective:    Vitals: BP 116/70 (BP Location: Left Arm)   Pulse 64   Temp 97.1 F (36.2 C)   Ht 5\' 7"  (1.702 m)   Wt 137 lb 4 oz (62.3 kg)   BMI 21.50 kg/m   Body mass index is 21.5 kg/m.  Tobacco History  Smoking Status  . Never Smoker  Smokeless Tobacco  . Never Used     Counseling given: No   Past Medical History:  Diagnosis Date  . 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 Balanitis   . BPH (benign prostatic hypertrophy) with urinary obstruction   . 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 Diabetes mellitus without complication ([place])   . Glaucoma   . Headache    rare - 1x/mo  . Nocturia   . Prostate cancer [facility])    Past Surgical History:  Procedure Laterality Date  . APPENDECTOMY  1954  . BLADDER SURGERY  Oct 2015  . CARDIAC CATHETERIZATION  2002   1 stent  . COLONOSCOPY    . EYE SURGERY  2011, 2013   cataracts  . EYE SURGERY Right May 2016   glaucoma  . HERNIA REPAIR Left 1967  . HERNIA REPAIR Right 05-10-14   Right femoral hernia repair Dr Bary Castilla with PerFix plug, no onlay mesh..   . PHOTOCOAGULATION WITH LASER Right 12/30/2015   Procedure: PHOTOCOAGULATION WITH LASER;  Surgeon: Ronnell Freshwater, MD;  Location: [facility];  Service: Ophthalmology;  Laterality: Right;  DIABETIC - oral meds IVA BLOCK  . PROSTATE SURGERY  Oct 2015   Family History  Problem Relation Age of Onset  . Prostate cancer Neg Hx   . Bladder Cancer Neg Hx    History  Sexual Activity  . Sexual activity: Not on file    Outpatient Encounter Prescriptions as of 08/31/2016  Medication Sig  . ACCU-CHEK SOFTCLIX LANCETS lancets USE TO TEST BLOOD SUGAR TWICE DAILY.  Mar[place] Kitchen ALTACE 2.5 MG capsule TAKE 1 CAPSULE BY MOUTH ONCE EVERY  OTHERDAY.  Marland Kitchen aspirin 81 MG tablet Take 81 mg by mouth daily.   . brimonidine-timolol (COMBIGAN) 0.2-0.5 % ophthalmic solution Place 1 drop into both eyes every 12 (twelve) hours.  . calcium carbonate (OS-CAL - DOSED IN MG OF ELEMENTAL CALCIUM) 1250 (500 Ca) MG tablet Take by mouth.  . dorzolamide (TRUSOPT) 2 % ophthalmic solution   . Garlic 10 MG CAPS Take by mouth.  Marland Kitchen glipiZIDE (GLUCOTROL) 5 MG tablet Take 1-2 tablets (5-10 mg total) by mouth 2 (two) times daily with a meal. 10 mg in am and 5 mg in pm  . glucose blood (ACCU-CHEK AVIVA PLUS) test strip Use as instructed  . lubiprostone (AMITIZA) 24 MCG capsule Take 1 capsule (24 mcg total) by mouth 2 (two) times daily with a meal. (Patient taking differently: Take 24 mcg by mouth 2 (two) times daily with a meal. )  . metFORMIN (GLUCOPHAGE-XR) 500 MG 24 hr tablet TAKE 1 TABLET BY MOUTH EVERY MORNING AND 2 TABLETS EVERY EVENING  . Multiple Vitamin (MULTIVITAMIN) tablet Take 1 tablet by mouth daily.  . OMEGA-3 FATTY ACIDS PO Take 1 tablet by mouth daily.  . travoprost, benzalkonium, (TRAVATAN) 0.004 % ophthalmic solution 1 drop at bedtime.  . vitamin C (  ASCORBIC ACID) 500 MG tablet Take 500 mg by mouth daily.   No facility-administered encounter medications on file as of 08/31/2016.     Activities of Daily Living In your present state of health, do you have any difficulty performing the following activities: 08/31/2016 02/28/2016  Hearing? N N  Vision? N N  Difficulty concentrating or making decisions? N N  Walking or climbing stairs? N N  Dressing or bathing? N N  Doing errands, shopping? N N  Preparing Food and eating ? N -  Using the Toilet? N -  In the past six months, have you accidently leaked urine? N -  Do you have problems with loss of bowel control? N -  Managing your Medications? N -  Managing your Finances? N -  Housekeeping or managing your Housekeeping? N -  Some recent data might be hidden    Patient Care Team: Roselee Nova, MD as PCP - General (Family Medicine) Robert Bellow, MD (General Surgery) Ronnell Freshwater, MD as Referring Physician (Ophthalmology) Yolonda Kida, MD as Consulting Physician (Cardiology) Anabel Bene, MD as Referring Physician (Neurology) Hollice Espy, MD as Consulting Physician (Urology)   Assessment:     Exercise Activities and Dietary recommendations Current Exercise Habits: Home exercise routine, Type of exercise: treadmill;strength training/weights;walking, Time (Minutes): 40 (to 1 hour), Frequency (Times/Week): 4, Weekly Exercise (Minutes/Week): 160, Exercise limited by: None identified  Goals    . Exercise          Starting 08/31/16, I will continue exercising 4 times a week for 40 minutes.      Fall Risk Fall Risk  08/31/2016 02/28/2016 01/22/2016 12/24/2015 12/19/2015  Falls in the past year? No No No No No   Depression Screen PHQ 2/9 Scores 08/31/2016 02/28/2016 01/22/2016 12/24/2015  PHQ - 2 Score 0 0 0 0    Cognitive Function     6CIT Screen 08/31/2016  What Year? 0 points  What month? 0 points  What time? 0 points  Count back from 20 0 points  Months in reverse 0 points  Repeat phrase 0 points  Total Score 0    Immunization History  Administered Date(s) Administered  . Influenza, High Dose Seasonal PF 08/14/2015, 07/30/2016  . Pneumococcal Conjugate-13 01/22/2014  . Pneumococcal-Unspecified 01/03/2008  . Tdap 11/04/2010  . Zoster 06/14/2008   Screening Tests Health Maintenance  Topic Date Due  . HEMOGLOBIN A1C  01/27/2017  . FOOT EXAM  02/27/2017  . OPHTHALMOLOGY EXAM  07/21/2017  . TETANUS/TDAP  11/04/2020  . INFLUENZA VACCINE  Completed  . ZOSTAVAX  Completed  . PNA vac Low Risk Adult  Completed      Plan:  I have personally reviewed and addressed the Medicare Annual Wellness questionnaire and have noted the following in the patient's chart:  A. Medical and social history B. Use of alcohol, tobacco or illicit  drugs  C. Current medications and supplements D. Functional ability and status E.  Nutritional status F.  Physical activity G. Advance directives H. List of other physicians I.  Hospitalizations, surgeries, and ER visits in previous 12 months J.  [place] such as hearing and vision if needed, cognitive and depression L. Referrals and appointments - none  In addition, I have reviewed and discussed with patient certain preventive protocols, quality metrics, and best practice recommendations. A written personalized care plan for preventive services as well as general preventive health recommendations were provided to patient.  See attached scanned questionnaire  for additional information.   Signed,  Fabio Neighbors, LPN Nurse Health Advisor

## 2016-09-21 ENCOUNTER — Encounter: Payer: Self-pay | Admitting: Podiatry

## 2016-09-21 ENCOUNTER — Ambulatory Visit (INDEPENDENT_AMBULATORY_CARE_PROVIDER_SITE_OTHER): Payer: Medicare Other | Admitting: Podiatry

## 2016-09-21 DIAGNOSIS — B351 Tinea unguium: Secondary | ICD-10-CM

## 2016-09-21 DIAGNOSIS — M79676 Pain in unspecified toe(s): Secondary | ICD-10-CM | POA: Diagnosis not present

## 2016-09-21 NOTE — Progress Notes (Signed)
He presents today for follow-up of his painful elongated toenails. He states that he is diabetic.  Objective: Vital signs are stable he's alert and oriented 3. His toenails are thick yellow dystrophic with mycotic. No open lesions or wounds are noted. Pulses remain palpable.  Assessment: Pain and limp secondary to onychomycosis.  Plan: Debridement of toenails 1 through 5 bilateral.

## 2016-09-23 ENCOUNTER — Other Ambulatory Visit: Payer: Self-pay | Admitting: Family Medicine

## 2016-10-02 ENCOUNTER — Ambulatory Visit: Payer: Medicare Other | Admitting: Urology

## 2016-10-08 ENCOUNTER — Other Ambulatory Visit: Payer: Medicare Other

## 2016-10-08 DIAGNOSIS — C61 Malignant neoplasm of prostate: Secondary | ICD-10-CM | POA: Diagnosis not present

## 2016-10-09 LAB — PSA: Prostate Specific Ag, Serum: 1.7 ng/mL (ref 0.0–4.0)

## 2016-10-13 DIAGNOSIS — Z7952 Long term (current) use of systemic steroids: Secondary | ICD-10-CM | POA: Diagnosis not present

## 2016-10-13 DIAGNOSIS — M542 Cervicalgia: Secondary | ICD-10-CM | POA: Diagnosis not present

## 2016-10-13 DIAGNOSIS — M353 Polymyalgia rheumatica: Secondary | ICD-10-CM | POA: Diagnosis not present

## 2016-10-15 ENCOUNTER — Ambulatory Visit (INDEPENDENT_AMBULATORY_CARE_PROVIDER_SITE_OTHER): Payer: Medicare Other | Admitting: Urology

## 2016-10-15 ENCOUNTER — Encounter: Payer: Self-pay | Admitting: Urology

## 2016-10-15 ENCOUNTER — Telehealth: Payer: Self-pay

## 2016-10-15 VITALS — BP 136/66 | HR 64 | Ht 67.0 in | Wt 141.5 lb

## 2016-10-15 DIAGNOSIS — K5900 Constipation, unspecified: Secondary | ICD-10-CM

## 2016-10-15 DIAGNOSIS — N138 Other obstructive and reflux uropathy: Secondary | ICD-10-CM

## 2016-10-15 DIAGNOSIS — N529 Male erectile dysfunction, unspecified: Secondary | ICD-10-CM

## 2016-10-15 DIAGNOSIS — C61 Malignant neoplasm of prostate: Secondary | ICD-10-CM

## 2016-10-15 DIAGNOSIS — N401 Enlarged prostate with lower urinary tract symptoms: Secondary | ICD-10-CM | POA: Diagnosis not present

## 2016-10-15 MED ORDER — AVANAFIL 200 MG PO TABS: 1.0000 | ORAL_TABLET | ORAL | 3 refills | Status: DC | PRN

## 2016-10-15 NOTE — Telephone Encounter (Signed)
Pt called stating he is not able to get the savings card to activate. We now have samples. Would you want me to give him some? Also we have a "speciality" pharmacy for [place]. Can we use that pharmacy?

## 2016-10-15 NOTE — Progress Notes (Signed)
8:59 AM  10/15/16   Thereasa Hunt [birth year] [id number]  Referring provider: Ashok Norris, MD No address on file  Chief Complaint  Patient presents with  . Prostate Cancer    Follow up    HPI:  75 year old male with a history of BPH and incidental prostate cancer.   He underwent TURP on 08/27/2014 no longer on Flomax and dutasteride. Prior to surgery, he did have a history of elevated postvoid residuals as well as bladder stones.  Pathology at the time of TURP showed incidental small focus of Gleason 3+3 prostate cancer. His PSA at the time of diagnosis was 0.7 on dutasteride.  No voiding complaints today.   He is no longer taking any BPH medications and quite pleased.    He complains today of issues with constipation.  He will go every 2-3 days, no longer taking stool softeners.    Previous post op PVRs have been minimal.  He returns today for rectal exam/PSA as well as reassessment of his voiding symptoms.  His most recent PSA has risen to 1.7 from 1.2 over the past 6 months.    He does have ED, tried Viagra 100 mg with no effect.  He is able to get an erection but not maintain them for penetration.   PMH: Past Medical History:  Diagnosis Date  . 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 Balanitis   . BPH (benign prostatic hypertrophy) with urinary obstruction   . 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 Diabetes mellitus without complication ([place])   . Glaucoma   . Headache    rare - 1x/mo  . Nocturia   . Prostate cancer [facility])     Surgical History: Past Surgical History:  Procedure Laterality Date  . APPENDECTOMY  1954  . BLADDER SURGERY  Oct 2015  . CARDIAC CATHETERIZATION  2002   1 stent  . COLONOSCOPY    . EYE SURGERY  2011, 2013   cataracts  . EYE SURGERY Right May 2016   glaucoma  . HERNIA REPAIR Left 1967  . HERNIA REPAIR Right 05-10-14   Right femoral hernia repair Dr Bary Castilla with PerFix plug, no onlay mesh..   . PHOTOCOAGULATION WITH LASER Right 12/30/2015   Procedure: PHOTOCOAGULATION WITH LASER;  Surgeon: Ronnell Freshwater, MD;  Location: [place];  Service: Ophthalmology;  Laterality: Right;  DIABETIC - oral meds IVA BLOCK  . PROSTATE SURGERY  Oct 2015    Home Medications:    Medication List       Accurate as of 10/15/16  8:59 AM. Always use your most recent med list.          ACCU-CHEK SOFTCLIX LANCETS lancets USE TO TEST BLOOD SUGAR TWICE DAILY.   ALTACE 2.5 MG capsule Generic drug:  ramipril TAKE 1 CAPSULE BY MOUTH ONCE EVERY OTHERDAY.   aspirin 81 MG tablet Take 81 mg by mouth daily.   Avanafil 200 MG Tabs Commonly known as:  STENDRA Take 1 tablet by mouth as needed.   calcium carbonate 1250 (500 Ca) MG tablet Commonly known as:  OS-CAL - dosed in mg of elemental calcium Take by mouth.   COMBIGAN 0.2-0.5 % ophthalmic solution Generic drug:  brimonidine-timolol Place 1 drop into both eyes every 12 (twelve) hours.   dorzolamide 2 % ophthalmic solution Commonly known as:  TRUSOPT   Garlic 10 MG Caps Take by mouth.   glipiZIDE 5 MG tablet Commonly known as:  GLUCOTROL Take 1-2 tablets (5-10  mg total) by mouth 2 (two) times daily with a meal. 10 mg in am and 5 mg in pm   glucose blood test strip Commonly known as:  ACCU-CHEK AVIVA PLUS Use as instructed   lubiprostone 24 MCG capsule Commonly known as:  AMITIZA Take 1 capsule (24 mcg total) by mouth 2 (two) times daily with a meal.   metFORMIN 500 MG 24 hr tablet Commonly known as:  GLUCOPHAGE-XR TAKE 1 TABLET BY MOUTH EVERY MORNING AND 2 TABLETS EVERY EVENING   multivitamin tablet Take 1 tablet by mouth daily.   OMEGA-3 FATTY ACIDS PO Take 1 tablet by mouth daily.   travoprost (benzalkonium) 0.004 % ophthalmic solution Commonly known as:  TRAVATAN 1 drop at bedtime.   vitamin C 500 MG tablet Commonly known as:  ASCORBIC ACID Take 500 mg by mouth daily.       Allergies:  Allergies  Allergen Reactions  . Penicillins Itching  and Other (See Comments)  . Statins Other (See Comments)    myalgia  . Welchol [Colesevelam Hcl]     Family History: Family History  Problem Relation Age of Onset  . Prostate cancer Neg Hx   . Bladder Cancer Neg Hx     Social History:  reports that he has never smoked. He has never used smokeless tobacco. He reports that he does not drink alcohol or use drugs.  ROS: UROLOGY Frequent Urination?: No Hard to postpone urination?: No Burning/pain with urination?: No Get up at night to urinate?: No Leakage of urine?: No Urine stream starts and stops?: No Trouble starting stream?: No Do you have to strain to urinate?: No Blood in urine?: No Urinary tract infection?: No Sexually transmitted disease?: No Injury to kidneys or bladder?: No Painful intercourse?: No Weak stream?: No Erection problems?: No Penile pain?: No  Gastrointestinal Nausea?: No Vomiting?: No Indigestion/heartburn?: No Diarrhea?: No Constipation?: No  Constitutional Fever: No Night sweats?: No Weight loss?: No Fatigue?: No  Skin Skin rash/lesions?: No Itching?: No  Eyes Blurred vision?: No Double vision?: No  Ears/Nose/Throat Sore throat?: No Sinus problems?: No  Hematologic/Lymphatic Swollen glands?: No Easy bruising?: No  Cardiovascular Leg swelling?: No Chest pain?: No  Respiratory Cough?: No Shortness of breath?: No  Endocrine Excessive thirst?: No  Musculoskeletal Back pain?: No Joint pain?: No  Neurological Headaches?: No Dizziness?: No  Psychologic Depression?: No Anxiety?: No  Physical Exam: BP 136/66 (BP Location: Left Arm, Patient Position: Sitting, Cuff Size: Normal)   Pulse 64   Ht 5\' 7"  (1.702 m)   Wt 141 lb 8 oz (64.2 kg)   BMI 22.16 kg/m   Constitutional:  Alert and oriented, No acute distress. HEENT: [place] AT, moist mucus membranes.  Trachea midline, no masses. Cardiovascular: No clubbing, cyanosis, or edema. Respiratory: Normal respiratory effort,  no increased work of breathing. GI: Abdomen is soft, nontender, nondistended, no abdominal masses GU: No CVA tenderness.   Rectal exam: Mildly decreased sphincter tone. Normal prostate, nontender, 40 cc gland, no nodules.   Skin: No rashes, bruises or suspicious lesions. Neurologic: Grossly intact, no focal deficits, moving all 4 extremities. Psychiatric: Normal mood and affect.  Laboratory Data: Lab Results  Component Value Date   WBC 3.7 (L) 08/27/2016   HGB 15.8 08/27/2016   HCT 47.3 08/27/2016   MCV 88.9 08/27/2016   PLT 209 08/27/2016    Lab Results  Component Value Date   CREATININE 0.95 08/27/2016    Lab Results  Component Value Date   HGBA1C 9.4  07/30/2016    Component     Latest Ref Rng & Units 10/04/2015 04/03/2016 10/08/2016  PSA     0.0 - 4.0 ng/mL 1.1 1.2 1.7   Pertinent Imaging: n/a  Assessment & Plan:    1. Prostate cancer [facility]) Incidental small focus of Gleason 3+3 prostate cancer for TURP 08/2014, iPSA 0.7 on dutasteride.  Mildly concerning rise to 1.7, will continue to follow closely. Recommend continued active surveillance given age and pathology.  Rectal exam today remains benign. - PSA/ DRE q6 months  2. BPH with urinary obstruction Symptoms minimal, on no medications. Overall doing very well. - Urinalysis, Complete - BLADDER SCAN AMB NON-IMAGING  3. Erectile dysfunction  Failed Viagra, will try Stendra- savings card given  Discussed VED and injections, not interseted    4. Constipation Advised daily colace  Return in about 6 months (around 04/15/2017) for PSA/ DRE.  Hollice Espy, MD  [facility] [address] [place], [place] [address] [phone]

## 2016-10-15 NOTE — Telephone Encounter (Signed)
Given samples.    Hollice Espy, MD

## 2016-10-20 DIAGNOSIS — H401133 Primary open-angle glaucoma, bilateral, severe stage: Secondary | ICD-10-CM | POA: Diagnosis not present

## 2016-10-28 ENCOUNTER — Other Ambulatory Visit: Payer: Self-pay | Admitting: Family Medicine

## 2016-10-28 DIAGNOSIS — H42 Glaucoma in diseases classified elsewhere: Secondary | ICD-10-CM

## 2016-10-28 DIAGNOSIS — E1140 Type 2 diabetes mellitus with diabetic neuropathy, unspecified: Secondary | ICD-10-CM

## 2016-10-28 DIAGNOSIS — E1165 Type 2 diabetes mellitus with hyperglycemia: Principal | ICD-10-CM

## 2016-10-28 DIAGNOSIS — E1139 Type 2 diabetes mellitus with other diabetic ophthalmic complication: Secondary | ICD-10-CM

## 2016-10-28 DIAGNOSIS — N521 Erectile dysfunction due to diseases classified elsewhere: Principal | ICD-10-CM

## 2016-10-28 DIAGNOSIS — IMO0002 Reserved for concepts with insufficient information to code with codable children: Secondary | ICD-10-CM

## 2016-10-28 NOTE — Telephone Encounter (Signed)
Patient requesting refill of Metformin to [name].

## 2016-11-03 DIAGNOSIS — H401133 Primary open-angle glaucoma, bilateral, severe stage: Secondary | ICD-10-CM | POA: Diagnosis not present

## 2016-11-03 LAB — HM DIABETES EYE EXAM

## 2016-11-04 ENCOUNTER — Telehealth: Payer: Self-pay

## 2016-11-04 ENCOUNTER — Encounter: Payer: Self-pay | Admitting: Family Medicine

## 2016-11-04 MED ORDER — ACCU-CHEK SOFTCLIX LANCETS MISC: 1 refills | Status: DC

## 2016-11-04 NOTE — Telephone Encounter (Signed)
Medication has been refilled and sent to [name] 

## 2016-11-06 ENCOUNTER — Other Ambulatory Visit: Payer: Self-pay | Admitting: *Deleted

## 2016-11-06 ENCOUNTER — Telehealth: Payer: Self-pay | Admitting: *Deleted

## 2016-11-06 MED ORDER — DESOXIMETASONE 0.25 % EX CREA: 1.0000 "application " | TOPICAL_CREAM | Freq: Two times a day (BID) | CUTANEOUS | 0 refills | Status: DC

## 2016-11-06 MED ORDER — DESOXIMETASONE 0.25 % EX CREA: 1.0000 "application " | TOPICAL_CREAM | Freq: Two times a day (BID) | CUTANEOUS | 1 refills | Status: DC

## 2016-11-06 NOTE — Telephone Encounter (Signed)
Sam - [name] states Topicort Cream is not available in 30grams, can he give pt 60grams. I told Sam that would be okay +0refills.

## 2016-11-30 ENCOUNTER — Other Ambulatory Visit: Payer: Self-pay | Admitting: Family Medicine

## 2016-11-30 DIAGNOSIS — H42 Glaucoma in diseases classified elsewhere: Secondary | ICD-10-CM

## 2016-11-30 DIAGNOSIS — E1140 Type 2 diabetes mellitus with diabetic neuropathy, unspecified: Secondary | ICD-10-CM

## 2016-11-30 DIAGNOSIS — IMO0002 Reserved for concepts with insufficient information to code with codable children: Secondary | ICD-10-CM

## 2016-11-30 DIAGNOSIS — N521 Erectile dysfunction due to diseases classified elsewhere: Principal | ICD-10-CM

## 2016-11-30 DIAGNOSIS — E1139 Type 2 diabetes mellitus with other diabetic ophthalmic complication: Secondary | ICD-10-CM

## 2016-11-30 DIAGNOSIS — E1165 Type 2 diabetes mellitus with hyperglycemia: Principal | ICD-10-CM

## 2016-12-14 DIAGNOSIS — M5030 Other cervical disc degeneration, unspecified cervical region: Secondary | ICD-10-CM | POA: Diagnosis not present

## 2016-12-14 DIAGNOSIS — M5413 Radiculopathy, cervicothoracic region: Secondary | ICD-10-CM | POA: Diagnosis not present

## 2016-12-14 DIAGNOSIS — M9901 Segmental and somatic dysfunction of cervical region: Secondary | ICD-10-CM | POA: Diagnosis not present

## 2016-12-14 DIAGNOSIS — R51 Headache: Secondary | ICD-10-CM | POA: Diagnosis not present

## 2016-12-15 ENCOUNTER — Ambulatory Visit: Payer: Medicare Other | Admitting: Family Medicine

## 2016-12-15 DIAGNOSIS — M5030 Other cervical disc degeneration, unspecified cervical region: Secondary | ICD-10-CM | POA: Diagnosis not present

## 2016-12-15 DIAGNOSIS — M9901 Segmental and somatic dysfunction of cervical region: Secondary | ICD-10-CM | POA: Diagnosis not present

## 2016-12-15 DIAGNOSIS — M5413 Radiculopathy, cervicothoracic region: Secondary | ICD-10-CM | POA: Diagnosis not present

## 2016-12-15 DIAGNOSIS — R51 Headache: Secondary | ICD-10-CM | POA: Diagnosis not present

## 2016-12-16 DIAGNOSIS — M9901 Segmental and somatic dysfunction of cervical region: Secondary | ICD-10-CM | POA: Diagnosis not present

## 2016-12-16 DIAGNOSIS — R51 Headache: Secondary | ICD-10-CM | POA: Diagnosis not present

## 2016-12-16 DIAGNOSIS — M5030 Other cervical disc degeneration, unspecified cervical region: Secondary | ICD-10-CM | POA: Diagnosis not present

## 2016-12-16 DIAGNOSIS — M5413 Radiculopathy, cervicothoracic region: Secondary | ICD-10-CM | POA: Diagnosis not present

## 2016-12-18 DIAGNOSIS — M5413 Radiculopathy, cervicothoracic region: Secondary | ICD-10-CM | POA: Diagnosis not present

## 2016-12-18 DIAGNOSIS — M5030 Other cervical disc degeneration, unspecified cervical region: Secondary | ICD-10-CM | POA: Diagnosis not present

## 2016-12-18 DIAGNOSIS — M9901 Segmental and somatic dysfunction of cervical region: Secondary | ICD-10-CM | POA: Diagnosis not present

## 2016-12-18 DIAGNOSIS — R51 Headache: Secondary | ICD-10-CM | POA: Diagnosis not present

## 2016-12-21 ENCOUNTER — Encounter: Payer: Self-pay | Admitting: Podiatry

## 2016-12-21 ENCOUNTER — Ambulatory Visit (INDEPENDENT_AMBULATORY_CARE_PROVIDER_SITE_OTHER): Payer: Medicare Other | Admitting: Podiatry

## 2016-12-21 DIAGNOSIS — B351 Tinea unguium: Secondary | ICD-10-CM

## 2016-12-21 DIAGNOSIS — R51 Headache: Secondary | ICD-10-CM | POA: Diagnosis not present

## 2016-12-21 DIAGNOSIS — M5413 Radiculopathy, cervicothoracic region: Secondary | ICD-10-CM | POA: Diagnosis not present

## 2016-12-21 DIAGNOSIS — M9901 Segmental and somatic dysfunction of cervical region: Secondary | ICD-10-CM | POA: Diagnosis not present

## 2016-12-21 DIAGNOSIS — E119 Type 2 diabetes mellitus without complications: Secondary | ICD-10-CM

## 2016-12-21 DIAGNOSIS — M5030 Other cervical disc degeneration, unspecified cervical region: Secondary | ICD-10-CM | POA: Diagnosis not present

## 2016-12-21 DIAGNOSIS — M79676 Pain in unspecified toe(s): Secondary | ICD-10-CM | POA: Diagnosis not present

## 2016-12-21 DIAGNOSIS — M2010 Hallux valgus (acquired), unspecified foot: Secondary | ICD-10-CM

## 2016-12-21 NOTE — Progress Notes (Signed)
Complaint:  Visit Type: Patient returns to my office for continued preventative foot care services. Complaint: Patient states" my nails have grown long and thick and become painful to walk and wear shoes" Patient has been diagnosed with DM with neuropathy. The patient presents for preventative foot care services. No changes to ROS  Podiatric Exam: Vascular: dorsalis pedis and posterior tibial pulses are palpable bilateral. Capillary return is immediate. Temperature gradient is WNL. Skin turgor WNL  Sensorium: Normal Semmes Weinstein monofilament test. Normal tactile sensation bilaterally. Nail Exam: Pt has thick disfigured discolored nails with subungual debris noted bilateral entire nail hallux through fifth toenails Ulcer Exam: There is no evidence of ulcer or pre-ulcerative changes or infection. Orthopedic Exam: Muscle tone and strength are WNL. No limitations in general ROM. No crepitus or effusions noted. Foot type and digits show no abnormalities.  HAV  B/L. Skin: No Porokeratosis. No infection or ulcers  Diagnosis:  Onychomycosis, , Pain in right toe, pain in left toes  Treatment & Plan Procedures and Treatment: Consent by patient was obtained for treatment procedures. The patient understood the discussion of treatment and procedures well. All questions were answered thoroughly reviewed. Debridement of mycotic and hypertrophic toenails, 1 through 5 bilateral and clearing of subungual debris. No ulceration, no infection noted.  Plan to obtain new diabetic shoes  Next visit. Return Visit-Office Procedure: Patient instructed to return to the office for a follow up visit 3 months for continued evaluation and treatment.    Gardiner Barefoot DPM

## 2016-12-23 DIAGNOSIS — M5413 Radiculopathy, cervicothoracic region: Secondary | ICD-10-CM | POA: Diagnosis not present

## 2016-12-23 DIAGNOSIS — M9901 Segmental and somatic dysfunction of cervical region: Secondary | ICD-10-CM | POA: Diagnosis not present

## 2016-12-23 DIAGNOSIS — M5030 Other cervical disc degeneration, unspecified cervical region: Secondary | ICD-10-CM | POA: Diagnosis not present

## 2016-12-23 DIAGNOSIS — R51 Headache: Secondary | ICD-10-CM | POA: Diagnosis not present

## 2017-01-04 ENCOUNTER — Ambulatory Visit (INDEPENDENT_AMBULATORY_CARE_PROVIDER_SITE_OTHER): Payer: Medicare Other | Admitting: Family Medicine

## 2017-01-04 ENCOUNTER — Encounter: Payer: Self-pay | Admitting: Family Medicine

## 2017-01-04 VITALS — BP 134/64 | HR 72 | Temp 97.8°F | Resp 16 | Ht 67.0 in | Wt 136.3 lb

## 2017-01-04 DIAGNOSIS — I10 Essential (primary) hypertension: Secondary | ICD-10-CM | POA: Diagnosis not present

## 2017-01-04 DIAGNOSIS — E785 Hyperlipidemia, unspecified: Secondary | ICD-10-CM

## 2017-01-04 DIAGNOSIS — M353 Polymyalgia rheumatica: Secondary | ICD-10-CM | POA: Diagnosis not present

## 2017-01-04 DIAGNOSIS — H409 Unspecified glaucoma: Secondary | ICD-10-CM

## 2017-01-04 DIAGNOSIS — K5909 Other constipation: Secondary | ICD-10-CM | POA: Diagnosis not present

## 2017-01-04 DIAGNOSIS — E1139 Type 2 diabetes mellitus with other diabetic ophthalmic complication: Secondary | ICD-10-CM

## 2017-01-04 DIAGNOSIS — E1140 Type 2 diabetes mellitus with diabetic neuropathy, unspecified: Secondary | ICD-10-CM

## 2017-01-04 DIAGNOSIS — Z789 Other specified health status: Secondary | ICD-10-CM | POA: Diagnosis not present

## 2017-01-04 DIAGNOSIS — E1149 Type 2 diabetes mellitus with other diabetic neurological complication: Secondary | ICD-10-CM

## 2017-01-04 DIAGNOSIS — N521 Erectile dysfunction due to diseases classified elsewhere: Secondary | ICD-10-CM | POA: Diagnosis not present

## 2017-01-04 DIAGNOSIS — H42 Glaucoma in diseases classified elsewhere: Principal | ICD-10-CM

## 2017-01-04 LAB — POCT GLYCOSYLATED HEMOGLOBIN (HGB A1C): Hemoglobin A1C: 7

## 2017-01-04 LAB — POCT UA - MICROALBUMIN: Microalbumin Ur, POC: 20 mg/L

## 2017-01-04 MED ORDER — LUBIPROSTONE 8 MCG PO CAPS: 8.0000 ug | ORAL_CAPSULE | Freq: Two times a day (BID) | ORAL | 5 refills | Status: DC

## 2017-01-04 MED ORDER — GLIPIZIDE 5 MG PO TABS: ORAL_TABLET | ORAL | 3 refills | Status: DC

## 2017-01-04 MED ORDER — METFORMIN HCL ER 500 MG PO TB24: ORAL_TABLET | ORAL | 3 refills | Status: DC

## 2017-01-04 MED ORDER — RAMIPRIL 2.5 MG PO CAPS: 2.5000 mg | ORAL_CAPSULE | Freq: Every day | ORAL | 3 refills | Status: DC

## 2017-01-04 NOTE — Progress Notes (Signed)
Name: Austin Hunt   MRN: [id number]    DOB: 11/04/1940   Date:01/04/2017       Progress Note  Subjective  Chief Complaint  Chief Complaint  Patient presents with  . Medication Refill  . Diabetes    Checks twice daily, Low- 90's Highest 170  . Hypertension    Denies any symptoms  . Irritable Bowel Syndrome    States stools are soft and controlled w medication  . Constipation    With medication goes every 5-7 days without going to bathroom but once he does will go 3-4 times in a row.     HPI  PMR: he states that he was feeling fine on Saturday Feb 11th and woke up on Sunday the 12 th of 2017  feeling stiff on his hips and with aching sensation on both shoulders, both hips, lower back and both legs were feeling very tired. He lost his appetite but blood sugar was higher than usual. He was started on prednisone on 12/20/2015 and is now seeing Dr Meda Coffee since March.   He was down to 1mg  of prednisone per day and pain was under control, he has been off medication for the past 2 weeks and is still feeling well and has not stiffness or pain.   DMII: glucose at home is only being checked fasting and is under 120. He denies polyphagia, polydipsia or polyuria. Vision is better since he had glaucoma surgery this past Spring. He also has ED and sees Urologist but states Viagra did not improve symptoms and is now on [place].  Nocturia at most once per night.   Leukopenia: WBC was low , but rechecked by Rheumatologist and it was back to normal, last WBC here improved.  HTN: patient is taking medication and denies side effects of medication  IBS with constipation:he states he only takes Amitiza 24 mcg twice daily causes diarrhea so he is now just taking it prn and has diarrhea when he takes it, we will decrease dose of Amitiza.    Patient Active Problem List   Diagnosis Date Noted  . Cervical pain (neck) 10/13/2016  . 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  Long term current use of systemic steroids 01/07/2016  . Neutropenia ([place]) 01/07/2016  . Polymyalgia rheumatica ([place]) 12/24/2015  . Anxiety 04/08/2015  . 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 Chronic constipation 04/08/2015  . 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 Failure of erection 04/08/2015  . H/O inguinal hernia repair 04/08/2015  . HLD (hyperlipidemia) 04/08/2015  . 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 LBP (low back pain) 04/08/2015  . 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 Drug intolerance 04/08/2015  . Kidney lump 04/08/2015  . TI (tricuspid incompetence) 04/08/2015  . Unilateral recurrent femoral hernia without obstruction or gangrene 05/23/2014  . Femoral hernia 05/23/2014  . Right inguinal hernia 02/06/2014  . Cataract 01/16/2014  . Glaucoma 01/16/2014  . Reflux esophagitis 06/21/2009  . Coronary atherosclerosis 04/03/2009  . Benign enlargement of prostate 02/11/2009  . Decreased libido 01/12/2008  . Benign essential HTN 09/12/2007    Past Surgical History:  Procedure Laterality Date  . APPENDECTOMY  1954  . BLADDER SURGERY  Oct 2015  . CARDIAC CATHETERIZATION  2002   1 stent  . COLONOSCOPY    . EYE SURGERY  2011, 2013   cataracts  . EYE SURGERY Right May 2016   glaucoma  . HERNIA REPAIR Left 1967  . HERNIA REPAIR Right 05-10-14  Right femoral hernia repair Dr Bary Castilla with PerFix plug, no onlay mesh..   . PHOTOCOAGULATION WITH LASER Right 12/30/2015   Procedure: PHOTOCOAGULATION WITH LASER;  Surgeon: Ronnell Freshwater, MD;  Location: [place];  Service: Ophthalmology;  Laterality: Right;  DIABETIC - oral meds IVA BLOCK  . PROSTATE SURGERY  Oct 2015    Family History  Problem Relation Age of Onset  . Prostate cancer Neg Hx   . Bladder Cancer Neg Hx     Social History   Social History  . Marital status: Married    Spouse name: N/A  . Number of children: N/A  . Years of education: N/A   Occupational History  . Not on file.    Social History Main Topics  . Smoking status: Never Smoker  . Smokeless tobacco: Never Used  . Alcohol use No  . Drug use: No  . Sexual activity: Not on file   Other Topics Concern  . Not on file   Social History Narrative  . No narrative on file     Current Outpatient Prescriptions:  .  ACCU-CHEK SOFTCLIX LANCETS lancets, USE TO TEST BLOOD SUGAR TWICE DAILY., Disp: 100 each, Rfl: 1 .  aspirin 81 MG tablet, Take 81 mg by mouth daily. , Disp: , Rfl:  .  Avanafil (STENDRA) 200 MG TABS, Take 1 tablet by mouth as needed., Disp: 6 tablet, Rfl: 3 .  brimonidine-timolol (COMBIGAN) 0.2-0.5 % ophthalmic solution, Place 1 drop into both eyes every 12 (twelve) hours., Disp: , Rfl:  .  calcium carbonate (OS-CAL - DOSED IN MG OF ELEMENTAL CALCIUM) 1250 (500 Ca) MG tablet, Take by mouth., Disp: , Rfl:  .  desoximetasone (TOPICORT) 0.25 % cream, Apply 1 application topically 2 (two) times daily., Disp: 60 g, Rfl: 0 .  dorzolamide (TRUSOPT) 2 % ophthalmic solution, , Disp: , Rfl:  .  Garlic 10 MG CAPS, Take by mouth., Disp: , Rfl:  .  glipiZIDE (GLUCOTROL) 5 MG tablet, Twice daily, Disp: 60 tablet, Rfl: 3 .  glucose blood ([employer] TEST STRIPS) test strip, Use as instructed, Disp: , Rfl:  .  lubiprostone (AMITIZA) 24 MCG capsule, Take 1 capsule (24 mcg total) by mouth 2 (two) times daily with a meal. (Patient taking differently: Take 24 mcg by mouth 2 (two) times daily with a meal. ), Disp: 60 capsule, Rfl: 2 .  metFORMIN (GLUCOPHAGE-XR) 500 MG 24 hr tablet, TAKE 1 TABLET BY MOUTH ONCE EVERY MORNING AND TAKE 2 TABLETS BY MOUTHEACHEVENING, Disp: 90 tablet, Rfl: 3 .  Multiple Vitamin (MULTIVITAMIN) tablet, Take 1 tablet by mouth daily., Disp: , Rfl:  .  OMEGA-3 FATTY ACIDS PO, Take 1 tablet by mouth daily., Disp: , Rfl:  .  ramipril (ALTACE) 2.5 MG capsule, Take 1 capsule (2.5 mg total) by mouth daily., Disp: 30 capsule, Rfl: 3 .  tadalafil (CIALIS) 5 MG tablet, Take by mouth., Disp: , Rfl:  .   travoprost, benzalkonium, (TRAVATAN) 0.004 % ophthalmic solution, 1 drop at bedtime., Disp: , Rfl:  .  vitamin C (ASCORBIC ACID) 500 MG tablet, Take 500 mg by mouth daily., Disp: , Rfl:   Allergies  Allergen Reactions  . Penicillins Itching and Other (See Comments)  . Statins Other (See Comments)    myalgia  . Welchol [Colesevelam Hcl]      ROS  Constitutional: Negative for fever, positive for weight change.  Respiratory: Negative for cough and shortness of breath.   Cardiovascular: Negative for chest pain or palpitations.  Gastrointestinal: Negative for abdominal pain, no bowel changes - he has constipation   Musculoskeletal: Negative for gait problem or joint swelling.  Skin: Negative for rash.  Neurological: Negative for dizziness or headache.  No other specific complaints in a complete review of systems (except as listed in HPI above).  Objective  Vitals:   01/04/17 1041  BP: 134/64  Pulse: 72  Resp: 16  Temp: 97.8 F (36.6 C)  TempSrc: Oral  SpO2: 93%  Weight: 136 lb 4.8 oz (61.8 kg)  Height: 5\' 7"  (1.702 m)    Body mass index is 21.35 kg/m.  Physical Exam  Constitutional: Patient appears well-developed and well-nourished. No distress.  HEENT: head atraumatic, normocephalic, pupils equal and reactive to light, neck supple, throat within normal limits Cardiovascular: Normal rate, regular rhythm and normal heart sounds.  No murmur heard. No BLE edema. Pulmonary/Chest: Effort normal and breath sounds normal. No respiratory distress. Abdominal: Soft.  There is no tenderness. Psychiatric: Patient has a normal mood and affect. behavior is normal. Judgment and thought content normal. Muscular Skeletal: no synovitis  Recent Results (from the past 2160 hour(s))  PSA     Status: None   Collection Time: 10/08/16  9:27 AM  Result Value Ref Range   Prostate Specific Ag, Serum 1.7 0.0 - 4.0 ng/mL    Comment: Roche ECLIA methodology. According to the American Urological  Association, Serum PSA should decrease and remain at undetectable levels after radical prostatectomy. The AUA defines biochemical recurrence as an initial PSA value 0.2 ng/mL or greater followed by a subsequent confirmatory PSA value 0.2 ng/mL or greater. Values obtained with different assay methods or kits cannot be used interchangeably. Results cannot be interpreted as absolute evidence of the presence or absence of malignant disease.   HM DIABETES EYE EXAM     Status: None   Collection Time: 11/03/16 12:00 AM  Result Value Ref Range   HM Diabetic Eye Exam No Retinopathy No Retinopathy    Comment: [facility] Dr. Karren Burly, MD  POCT HgB A1C     Status: Abnormal   Collection Time: 01/04/17 10:45 AM  Result Value Ref Range   Hemoglobin A1C 7.0   POCT UA - Microalbumin     Status: None   Collection Time: 01/04/17 10:45 AM  Result Value Ref Range   Microalbumin Ur, POC 20 mg/L   Creatinine, POC  mg/dL   Albumin/Creatinine Ratio, Urine, POC        PHQ2/9: Depression screen [facility] 2/9 01/04/2017 08/31/2016 02/28/2016 01/22/2016 12/24/2015  Decreased Interest 0 0 0 0 0  Down, Depressed, Hopeless 0 0 0 0 0  PHQ - 2 Score 0 0 0 0 0    Fall Risk: Fall Risk  01/04/2017 08/31/2016 02/28/2016 01/22/2016 12/24/2015  Falls in the past year? Yes No No No No  Number falls in past yr: 1 - - - -  Injury with Fall? Yes - - - -     Functional Status Survey: Is the patient deaf or have difficulty hearing?: No Does the patient have difficulty seeing, even when wearing glasses/contacts?: No Does the patient have difficulty concentrating, remembering, or making decisions?: No Does the patient have difficulty walking or climbing stairs?: No Does the patient have difficulty dressing or bathing?: No Does the patient have difficulty doing errands alone such as visiting a doctor's office or shopping?: No    Assessment & Plan    1. 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 Polymyalgia rheumatica ([place])   3. Essential hypertension  - ramipril (ALTACE) 2.5 MG capsule; Take 1 capsule (2.5 mg total) by mouth daily.  Dispense: 30 capsule; Refill: 3   4. Chronic constipation  Higher dose causes diarrhea, we will try lower dose - lubiprostone (AMITIZA) 8 MCG capsule; Take 1 capsule (8 mcg total) by mouth 2 (two) times daily with a meal.  Dispense: 60 capsule; Refill: 5  5. Dyslipidemia  Improved, unable to take statin  6. Statin intolerance   7. 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

## 2017-01-06 DIAGNOSIS — M9901 Segmental and somatic dysfunction of cervical region: Secondary | ICD-10-CM | POA: Diagnosis not present

## 2017-01-06 DIAGNOSIS — M5030 Other cervical disc degeneration, unspecified cervical region: Secondary | ICD-10-CM | POA: Diagnosis not present

## 2017-01-06 DIAGNOSIS — R51 Headache: Secondary | ICD-10-CM | POA: Diagnosis not present

## 2017-01-06 DIAGNOSIS — M5413 Radiculopathy, cervicothoracic region: Secondary | ICD-10-CM | POA: Diagnosis not present

## 2017-01-11 DIAGNOSIS — M353 Polymyalgia rheumatica: Secondary | ICD-10-CM | POA: Diagnosis not present

## 2017-01-21 DIAGNOSIS — M353 Polymyalgia rheumatica: Secondary | ICD-10-CM | POA: Diagnosis not present

## 2017-01-22 DIAGNOSIS — M9901 Segmental and somatic dysfunction of cervical region: Secondary | ICD-10-CM | POA: Diagnosis not present

## 2017-01-22 DIAGNOSIS — M5413 Radiculopathy, cervicothoracic region: Secondary | ICD-10-CM | POA: Diagnosis not present

## 2017-01-22 DIAGNOSIS — M5030 Other cervical disc degeneration, unspecified cervical region: Secondary | ICD-10-CM | POA: Diagnosis not present

## 2017-01-22 DIAGNOSIS — R51 Headache: Secondary | ICD-10-CM | POA: Diagnosis not present

## 2017-02-03 DIAGNOSIS — H401133 Primary open-angle glaucoma, bilateral, severe stage: Secondary | ICD-10-CM | POA: Diagnosis not present

## 2017-02-09 ENCOUNTER — Other Ambulatory Visit: Payer: Self-pay

## 2017-02-09 NOTE — Telephone Encounter (Signed)
Patient requesting refill of Accu-Chek Aviva Test Strips to [name].

## 2017-02-10 MED ORDER — GLUCOSE BLOOD VI STRP: ORAL_STRIP | 12 refills | Status: DC

## 2017-02-17 DIAGNOSIS — R221 Localized swelling, mass and lump, neck: Secondary | ICD-10-CM | POA: Diagnosis not present

## 2017-02-24 DIAGNOSIS — R51 Headache: Secondary | ICD-10-CM | POA: Diagnosis not present

## 2017-02-24 DIAGNOSIS — M5413 Radiculopathy, cervicothoracic region: Secondary | ICD-10-CM | POA: Diagnosis not present

## 2017-02-24 DIAGNOSIS — M9901 Segmental and somatic dysfunction of cervical region: Secondary | ICD-10-CM | POA: Diagnosis not present

## 2017-02-24 DIAGNOSIS — M5030 Other cervical disc degeneration, unspecified cervical region: Secondary | ICD-10-CM | POA: Diagnosis not present

## 2017-03-22 ENCOUNTER — Encounter: Payer: Self-pay | Admitting: Podiatry

## 2017-03-22 ENCOUNTER — Ambulatory Visit (INDEPENDENT_AMBULATORY_CARE_PROVIDER_SITE_OTHER): Payer: Medicare Other | Admitting: Podiatry

## 2017-03-22 DIAGNOSIS — B351 Tinea unguium: Secondary | ICD-10-CM | POA: Diagnosis not present

## 2017-03-22 DIAGNOSIS — M79676 Pain in unspecified toe(s): Secondary | ICD-10-CM

## 2017-03-22 DIAGNOSIS — E119 Type 2 diabetes mellitus without complications: Secondary | ICD-10-CM

## 2017-03-22 DIAGNOSIS — M2010 Hallux valgus (acquired), unspecified foot: Secondary | ICD-10-CM

## 2017-03-22 NOTE — Progress Notes (Addendum)
Complaint:  Visit Type: Patient returns to my office for continued preventative foot care services. Complaint: Patient states" my nails have grown long and thick and become painful to walk and wear shoes" Patient has been diagnosed with DM with neuropathy. The patient presents for preventative foot care services. No changes to ROS  Podiatric Exam: Vascular: dorsalis pedis and posterior tibial pulses are palpable bilateral. Capillary return is immediate. Temperature gradient is WNL. Skin turgor WNL  Sensorium: Normal Semmes Weinstein monofilament test. Normal tactile sensation bilaterally. Nail Exam: Pt has thick disfigured discolored nails with subungual debris noted bilateral entire nail hallux through fifth toenails Ulcer Exam: There is no evidence of ulcer or pre-ulcerative changes or infection. Orthopedic Exam: Muscle tone and strength are WNL. No limitations in general ROM. No crepitus or effusions noted. Foot type and digits show no abnormalities.  HAV  B/L. Skin: No Porokeratosis. No infection or ulcers  Diagnosis:  Onychomycosis, , Pain in right toe, pain in left toes,  Diabetes with neuropathy  HAV  B/L  Treatment & Plan Procedures and Treatment: Consent by patient was obtained for treatment procedures. The patient understood the discussion of treatment and procedures well. All questions were answered thoroughly reviewed. Debridement of mycotic and hypertrophic toenails, 1 through 5 bilateral and clearing of subungual debris. No ulceration, no infection noted.  Measured for diabetic shoes. Discussed his itching outside right foot. Return Visit-Office Procedure: Patient instructed to return to the office for a follow up visit 3 months for continued evaluation and treatment.    Gardiner Barefoot DPM

## 2017-03-24 DIAGNOSIS — R51 Headache: Secondary | ICD-10-CM | POA: Diagnosis not present

## 2017-03-24 DIAGNOSIS — M9901 Segmental and somatic dysfunction of cervical region: Secondary | ICD-10-CM | POA: Diagnosis not present

## 2017-03-24 DIAGNOSIS — M5030 Other cervical disc degeneration, unspecified cervical region: Secondary | ICD-10-CM | POA: Diagnosis not present

## 2017-03-24 DIAGNOSIS — M5413 Radiculopathy, cervicothoracic region: Secondary | ICD-10-CM | POA: Diagnosis not present

## 2017-04-10 ENCOUNTER — Other Ambulatory Visit: Payer: Self-pay | Admitting: Family Medicine

## 2017-04-10 DIAGNOSIS — E1139 Type 2 diabetes mellitus with other diabetic ophthalmic complication: Secondary | ICD-10-CM

## 2017-04-10 DIAGNOSIS — H42 Glaucoma in diseases classified elsewhere: Secondary | ICD-10-CM

## 2017-04-10 DIAGNOSIS — E1140 Type 2 diabetes mellitus with diabetic neuropathy, unspecified: Principal | ICD-10-CM

## 2017-04-10 DIAGNOSIS — N521 Erectile dysfunction due to diseases classified elsewhere: Secondary | ICD-10-CM

## 2017-04-16 ENCOUNTER — Encounter: Payer: Self-pay | Admitting: Urology

## 2017-04-16 ENCOUNTER — Ambulatory Visit (INDEPENDENT_AMBULATORY_CARE_PROVIDER_SITE_OTHER): Payer: Medicare Other | Admitting: Urology

## 2017-04-16 VITALS — BP 146/70 | HR 71 | Ht 68.0 in | Wt 142.0 lb

## 2017-04-16 DIAGNOSIS — C61 Malignant neoplasm of prostate: Secondary | ICD-10-CM

## 2017-04-16 MED ORDER — SILDENAFIL CITRATE 100 MG PO TABS: 100.0000 mg | ORAL_TABLET | Freq: Every day | ORAL | 11 refills | Status: DC | PRN

## 2017-04-16 NOTE — Progress Notes (Signed)
10:13 AM  04/16/17   Austin Hunt May 15, 1941 [id number]  Referring provider: Roselee Nova, MD [address] [place], [place] [address]  Chief Complaint  Patient presents with  . Prostate Cancer    6 month follow up   . Benign Prostatic Hypertrophy    HPI:  76 year old male with a history of BPH and incidental prostate cancer.   He underwent TURP on 08/27/2014 no longer on Flomax and dutasteride. Prior to surgery, he did have a history of elevated postvoid residuals as well as bladder stones.  Pathology at the time of TURP showed incidental small focus of Gleason 3+3 prostate cancer. His PSA at the time of diagnosis was 0.7 on dutasteride.   No voiding complaints today.   He is no longer taking any BPH medications and quite pleased.  He get up once a night to void.    His constipation has resolved.    Previous post op PVRs have been minimal.  He returns today for rectal exam/PSA as well as reassessment of his voiding symptoms.  His most recent PSA has risen to 1.7 from 1.26 upon last check 6 months ago.  PSA was drawn today and is pending.  He does have ED, tried Viagra 100 mg with no effect.  He was given samples of Stendra at last visit.  He did not find these any more effective than Viagra. He requests trying Viagra again today.   PMH: Past Medical History:  Diagnosis Date  . 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 Balanitis   . BPH (benign prostatic hypertrophy) with urinary obstruction   . 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 Diabetes mellitus without complication ([place])   . Glaucoma   . Headache    rare - 1x/mo  . Nocturia   . Prostate cancer [facility])     Surgical History: Past Surgical History:  Procedure Laterality Date  . APPENDECTOMY  1954  . BLADDER SURGERY  Oct 2015  . CARDIAC CATHETERIZATION  2002   1 stent  . COLONOSCOPY    . EYE SURGERY  2011, 2013   cataracts  . EYE SURGERY Right May 2016   glaucoma  . HERNIA REPAIR Left 1967  . HERNIA  REPAIR Right 05-10-14   Right femoral hernia repair Dr Bary Castilla with PerFix plug, no onlay mesh..   . PHOTOCOAGULATION WITH LASER Right 12/30/2015   Procedure: PHOTOCOAGULATION WITH LASER;  Surgeon: Ronnell Freshwater, MD;  Location: [place];  Service: Ophthalmology;  Laterality: Right;  DIABETIC - oral meds IVA BLOCK  . PROSTATE SURGERY  Oct 2015    Home Medications:  Allergies as of 04/16/2017      Reactions   Penicillins Itching, Other (See Comments)   Statins Other (See Comments)   myalgia   Welchol [colesevelam Hcl]       Medication List       Accurate as of 04/16/17 10:13 AM. Always use your most recent med list.          ACCU-CHEK SOFTCLIX LANCETS lancets USE TO TEST BLOOD SUGAR TWICE DAILY.   aspirin 81 MG tablet Take 81 mg by mouth daily.   calcium carbonate 1250 (500 Ca) MG tablet Commonly known as:  OS-CAL - dosed in mg of elemental calcium Take by mouth.   COMBIGAN 0.2-0.5 % ophthalmic solution Generic drug:  brimonidine-timolol Place 1 drop into both eyes every 12 (twelve) hours.   dorzolamide 2 % ophthalmic solution Commonly  known as:  TRUSOPT   Garlic 10 MG Caps Take by mouth.   glipiZIDE 5 MG tablet Commonly known as:  GLUCOTROL Twice daily   glucose blood test strip Commonly known as:  ACCU-CHEK AVIVA Use as instructed   lubiprostone 8 MCG capsule Commonly known as:  AMITIZA Take 1 capsule (8 mcg total) by mouth 2 (two) times daily with a meal.   metFORMIN 500 MG 24 hr tablet Commonly known as:  GLUCOPHAGE-XR TAKE 1 TABLET BY MOUTH ONCE EVERY MORNING AND 2 TABLETS ONCE EVERY EVENING   multivitamin tablet Take 1 tablet by mouth daily.   OMEGA-3 FATTY ACIDS PO Take 1 tablet by mouth daily.   ramipril 2.5 MG capsule Commonly known as:  ALTACE Take 1 capsule (2.5 mg total) by mouth daily.   sildenafil 100 MG tablet Commonly known as:  VIAGRA Take 1 tablet (100 mg total) by mouth daily as needed for erectile  dysfunction.   tadalafil 5 MG tablet Commonly known as:  CIALIS Take by mouth.   travoprost (benzalkonium) 0.004 % ophthalmic solution Commonly known as:  TRAVATAN 1 drop at bedtime.   vitamin C 500 MG tablet Commonly known as:  ASCORBIC ACID Take 500 mg by mouth daily.       Allergies:  Allergies  Allergen Reactions  . Penicillins Itching and Other (See Comments)  . Statins Other (See Comments)    myalgia  . Welchol [Colesevelam Hcl]     Family History: Family History  Problem Relation Age of Onset  . Prostate cancer Neg Hx   . Bladder Cancer Neg Hx     Social History:  reports that he has never smoked. He has never used smokeless tobacco. He reports that he does not drink alcohol or use drugs.  ROS: UROLOGY Frequent Urination?: No Hard to postpone urination?: No Burning/pain with urination?: No Get up at night to urinate?: No Leakage of urine?: No Urine stream starts and stops?: No Trouble starting stream?: No Do you have to strain to urinate?: No Blood in urine?: No Urinary tract infection?: No Sexually transmitted disease?: No Injury to kidneys or bladder?: No Painful intercourse?: No Weak stream?: No Erection problems?: No Penile pain?: No  Gastrointestinal Nausea?: No Vomiting?: No Indigestion/heartburn?: No Diarrhea?: No Constipation?: No  Constitutional Fever: No Night sweats?: No Weight loss?: No Fatigue?: No  Skin Skin rash/lesions?: No Itching?: No  Eyes Blurred vision?: No Double vision?: No  Ears/Nose/Throat Sore throat?: No Sinus problems?: No  Hematologic/Lymphatic Swollen glands?: No Easy bruising?: No  Cardiovascular Leg swelling?: No Chest pain?: No  Respiratory Cough?: No Shortness of breath?: No  Endocrine Excessive thirst?: No  Musculoskeletal Back pain?: No Joint pain?: No  Neurological Headaches?: No Dizziness?: No  Psychologic Depression?: No Anxiety?: No  Physical Exam: BP (!) 146/70    Pulse 71   Ht 5\' 8"  (1.727 m)   Wt 142 lb (64.4 kg)   BMI 21.59 kg/m   Constitutional:  Alert and oriented, No acute distress. HEENT: [place] AT, moist mucus membranes.  Trachea midline, no masses. Cardiovascular: No clubbing, cyanosis, or edema. Respiratory: Normal respiratory effort, no increased work of breathing. GI: Abdomen is soft, nontender, nondistended, no abdominal masses GU: No CVA tenderness.   Rectal exam Deferred today, performed 6 months ago at which time prostate was noted to be 40 cc without nodules  Skin: No rashes, bruises or suspicious lesions. Neurologic: Grossly intact, no focal deficits, moving all 4 extremities. Psychiatric: Normal mood and affect.  Laboratory  Data: Lab Results  Component Value Date   WBC 3.7 (L) 08/27/2016   HGB 15.8 08/27/2016   HCT 47.3 08/27/2016   MCV 88.9 08/27/2016   PLT 209 08/27/2016    Lab Results  Component Value Date   CREATININE 0.95 08/27/2016    Lab Results  Component Value Date   HGBA1C 7.0 01/04/2017    Component     Latest Ref Rng & Units 10/04/2015 04/03/2016 10/08/2016  PSA     0.0 - 4.0 ng/mL 1.1 1.2 1.7   Pertinent Imaging: n/a  Assessment & Plan:    1. Prostate cancer [facility]) Incidental small focus of Gleason 3+3 prostate cancer for TURP 08/2014, iPSA 0.7 on dutasteride.  Recommend continued active surveillance given age and pathology. We'll call with PSA results today, is stable then follow-up in one year, if continues to rise, we'll likely recommend follow up sooner in 6 months - PSA/ DRE q6 months  2. BPH with urinary obstruction Symptoms minimal, on no medications. Overall doing very well. - Urinalysis, Complete - BLADDER SCAN AMB NON-IMAGING  3. Erectile dysfunction  Interested in trying Viagra again today, prescription sent to pharmacy Reviewed ideal administration, on empty stomach 1 hour prior to sexual intercourse Discussed VED and injections, not interseted     Return in about 1 year (around  04/16/2018) for PSA/ DRE.  Hollice Espy, MD  [facility] [address] [place], [place], [place] [address] [phone]

## 2017-04-17 LAB — PSA: Prostate Specific Ag, Serum: 1.5 ng/mL (ref 0.0–4.0)

## 2017-04-19 ENCOUNTER — Telehealth: Payer: Self-pay

## 2017-04-19 NOTE — Telephone Encounter (Signed)
Called patient. Gave lab results. Patient verbalized understanding.  

## 2017-04-19 NOTE — Telephone Encounter (Signed)
-----   Message from Hollice Espy, MD sent at 04/17/2017  1:52 PM EDT ----- PSA stable.  Follow up as scheduled.  Hollice Espy, MD

## 2017-04-20 ENCOUNTER — Telehealth: Payer: Self-pay

## 2017-04-20 NOTE — Telephone Encounter (Signed)
Spoke to patient. He says he picked up the prescription.

## 2017-04-20 NOTE — Telephone Encounter (Signed)
Called pt: No answer Received [facility] fax: pt states pharmacy did not have Rx for Viagra on 06/15. Called pt to check status of Rx b/c showing E-scribed on 06/15 to pharmacy on file.

## 2017-05-04 DIAGNOSIS — H401133 Primary open-angle glaucoma, bilateral, severe stage: Secondary | ICD-10-CM | POA: Diagnosis not present

## 2017-05-05 ENCOUNTER — Other Ambulatory Visit: Payer: Self-pay | Admitting: Family Medicine

## 2017-05-07 ENCOUNTER — Ambulatory Visit: Payer: Medicare Other | Admitting: Family Medicine

## 2017-05-26 DIAGNOSIS — M5413 Radiculopathy, cervicothoracic region: Secondary | ICD-10-CM | POA: Diagnosis not present

## 2017-05-26 DIAGNOSIS — M5030 Other cervical disc degeneration, unspecified cervical region: Secondary | ICD-10-CM | POA: Diagnosis not present

## 2017-05-26 DIAGNOSIS — R51 Headache: Secondary | ICD-10-CM | POA: Diagnosis not present

## 2017-05-26 DIAGNOSIS — M9901 Segmental and somatic dysfunction of cervical region: Secondary | ICD-10-CM | POA: Diagnosis not present

## 2017-05-27 ENCOUNTER — Ambulatory Visit (INDEPENDENT_AMBULATORY_CARE_PROVIDER_SITE_OTHER): Payer: Medicare Other | Admitting: Family Medicine

## 2017-05-27 ENCOUNTER — Encounter: Payer: Self-pay | Admitting: Family Medicine

## 2017-05-27 VITALS — BP 132/68 | HR 68 | Temp 97.6°F | Resp 16 | Ht 68.0 in | Wt 141.8 lb

## 2017-05-27 DIAGNOSIS — E1139 Type 2 diabetes mellitus with other diabetic ophthalmic complication: Secondary | ICD-10-CM | POA: Diagnosis not present

## 2017-05-27 DIAGNOSIS — I10 Essential (primary) hypertension: Secondary | ICD-10-CM | POA: Diagnosis not present

## 2017-05-27 DIAGNOSIS — E1165 Type 2 diabetes mellitus with hyperglycemia: Secondary | ICD-10-CM

## 2017-05-27 DIAGNOSIS — E1149 Type 2 diabetes mellitus with other diabetic neurological complication: Secondary | ICD-10-CM | POA: Diagnosis not present

## 2017-05-27 DIAGNOSIS — D708 Other neutropenia: Secondary | ICD-10-CM | POA: Diagnosis not present

## 2017-05-27 DIAGNOSIS — H42 Glaucoma in diseases classified elsewhere: Secondary | ICD-10-CM | POA: Diagnosis not present

## 2017-05-27 DIAGNOSIS — IMO0002 Reserved for concepts with insufficient information to code with codable children: Secondary | ICD-10-CM

## 2017-05-27 DIAGNOSIS — E1140 Type 2 diabetes mellitus with diabetic neuropathy, unspecified: Secondary | ICD-10-CM

## 2017-05-27 DIAGNOSIS — N521 Erectile dysfunction due to diseases classified elsewhere: Secondary | ICD-10-CM

## 2017-05-27 LAB — POCT GLYCOSYLATED HEMOGLOBIN (HGB A1C): Hemoglobin A1C: 7.2

## 2017-05-27 MED ORDER — METFORMIN HCL ER 500 MG PO TB24: ORAL_TABLET | ORAL | 2 refills | Status: DC

## 2017-05-27 MED ORDER — GLIPIZIDE 5 MG PO TABS: ORAL_TABLET | ORAL | 3 refills | Status: DC

## 2017-05-27 MED ORDER — RAMIPRIL 2.5 MG PO CAPS: 2.5000 mg | ORAL_CAPSULE | Freq: Every day | ORAL | 3 refills | Status: DC

## 2017-05-27 NOTE — Progress Notes (Signed)
Name: Austin Hunt   MRN: [id number]    DOB: 1941/05/26   Date:05/27/2017       Progress Note  Subjective  Chief Complaint  Chief Complaint  Patient presents with  . Medication Refill    4 month F/U  . Abdominal Pain    Onset-couple of months his right side has been bothering him, intermittent pain.   . Diabetes    Checks every am, Low-83 Average-90's Highest-135  . Hypertension    Denies any symptoms  . Irritable Bowel Syndrome    Constipation every once in awhile cut back on taking Amitiza due to making his stool watery like    HPI  PMR: he states that he was feeling fine on Saturday Feb 11th and woke up on Sunday the 12 th of 2017 feeling stiff on his hips and with aching sensation on both shoulders, both hips, lower back and both legs were feeling very tired. He lost his appetite but blood sugar was higher than usual. He was started on prednisone on 12/20/2015 and is now seeing Dr Meda Coffee since March. He was down to 1mg  of prednisone per day and pain was under control, he has been off medication for the past 6 months and is doing well.  DMII: glucose at home is only being checked fasting and is under 80-90. He denies polyphagia, polydipsia or polyuria. Vision is better since he had glaucoma surgery this Spring 2017 . He also has ED and sees Urologist but states Viagra did not improve symptoms, but still uses that - given by Urologist.  Nocturia at most once per night. He has been taking Metformin daily and glipizide at night glucose fasting is towards low end but hgbA1C has gone up from 7.0 to 7.2% , likely having spikes after meals, we will change time he takes glipizide  Leukopenia: WBC was low , but rechecked by Rheumatologist and had improved, we will recheck next visit.  HTN: patient is taking medication and denies side effects of medication, no dizziness or chest pain  IBS with constipation:he states he only takes Amitiza 24 mcg twice daily causes diarrhea so he is now  just taking it prn and has diarrhea when he takes it, we decreased the dose of Amitiza,he states lower dose does not work as well, but he prefers continue on lower dose, advised to take two of the 8 mg tablets . He has intermittent right lower quadrant pain, at most twice month, lasts just a few minutes and resolves by itself, reassurance for now. He states only happens when constipated, no problems when having regular bowel movements.   Patient Active Problem List   Diagnosis Date Noted  . Cervical pain (neck) 10/13/2016  . 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 Long term current use of systemic steroids 01/07/2016  . Neutropenia ([place]) 01/07/2016  . Polymyalgia rheumatica ([place]) 12/24/2015  . Anxiety 04/08/2015  . 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 Chronic constipation 04/08/2015  . 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 Failure of erection 04/08/2015  . H/O inguinal hernia repair 04/08/2015  . HLD (hyperlipidemia) 04/08/2015  . 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 LBP (low back pain) 04/08/2015  . 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 Drug intolerance 04/08/2015  . Kidney lump 04/08/2015  . TI (tricuspid incompetence) 04/08/2015  . Unilateral recurrent femoral hernia without obstruction or gangrene 05/23/2014  . Femoral hernia 05/23/2014  . Right inguinal hernia 02/06/2014  . Cataract 01/16/2014  . Glaucoma 01/16/2014  .  Reflux esophagitis 06/21/2009  . Coronary atherosclerosis 04/03/2009  . Benign enlargement of prostate 02/11/2009  . Decreased libido 01/12/2008  . Benign essential HTN 09/12/2007    Past Surgical History:  Procedure Laterality Date  . APPENDECTOMY  1954  . BLADDER SURGERY  Oct 2015  . CARDIAC CATHETERIZATION  2002   1 stent  . COLONOSCOPY    . EYE SURGERY  2011, 2013   cataracts  . EYE SURGERY Right May 2016   glaucoma  . HERNIA REPAIR Left 1967  . HERNIA REPAIR Right 05-10-14   Right femoral hernia repair Dr  Bary Castilla with PerFix plug, no onlay mesh..   . PHOTOCOAGULATION WITH LASER Right 12/30/2015   Procedure: PHOTOCOAGULATION WITH LASER;  Surgeon: Ronnell Freshwater, MD;  Location: [place];  Service: Ophthalmology;  Laterality: Right;  DIABETIC - oral meds IVA BLOCK  . PROSTATE SURGERY  Oct 2015    Family History  Problem Relation Age of Onset  . Prostate cancer Neg Hx   . Bladder Cancer Neg Hx     Social History   Social History  . Marital status: Married    Spouse name: N/A  . Number of children: N/A  . Years of education: N/A   Occupational History  . Not on file.   Social History Main Topics  . Smoking status: Never Smoker  . Smokeless tobacco: Never Used  . Alcohol use No  . Drug use: No  . Sexual activity: Not on file   Other Topics Concern  . Not on file   Social History Narrative  . No narrative on file     Current Outpatient Prescriptions:  .  ACCU-CHEK SOFTCLIX LANCETS lancets, TEST TWICE DAILY, Disp: 100 each, Rfl: 2 .  aspirin 81 MG tablet, Take 81 mg by mouth daily. , Disp: , Rfl:  .  brimonidine-timolol (COMBIGAN) 0.2-0.5 % ophthalmic solution, Place 1 drop into both eyes every 12 (twelve) hours., Disp: , Rfl:  .  dorzolamide (TRUSOPT) 2 % ophthalmic solution, , Disp: , Rfl:  .  Garlic 10 MG CAPS, Take by mouth., Disp: , Rfl:  .  glipiZIDE (GLUCOTROL) 5 MG tablet, Before breakfast, Disp: 30 tablet, Rfl: 3 .  glucose blood (ACCU-CHEK AVIVA) test strip, Use as instructed, Disp: 100 each, Rfl: 12 .  metFORMIN (GLUCOPHAGE-XR) 500 MG 24 hr tablet, TAKE 1 TABLET BY MOUTH ONCE EVERY MORNING AND 2 TABLETS ONCE EVERY EVENING, Disp: 90 tablet, Rfl: 2 .  Multiple Vitamin (MULTIVITAMIN) tablet, Take 1 tablet by mouth daily., Disp: , Rfl:  .  OMEGA-3 FATTY ACIDS PO, Take 1 tablet by mouth daily., Disp: , Rfl:  .  ramipril (ALTACE) 2.5 MG capsule, Take 1 capsule (2.5 mg total) by mouth daily., Disp: 30 capsule, Rfl: 3 .  sildenafil (VIAGRA) 100 MG  tablet, Take 1 tablet (100 mg total) by mouth daily as needed for erectile dysfunction., Disp: 6 tablet, Rfl: 11 .  travoprost, benzalkonium, (TRAVATAN) 0.004 % ophthalmic solution, 1 drop at bedtime., Disp: , Rfl:  .  vitamin C (ASCORBIC ACID) 500 MG tablet, Take 500 mg by mouth daily., Disp: , Rfl:  .  calcium carbonate (OS-CAL - DOSED IN MG OF ELEMENTAL CALCIUM) 1250 (500 Ca) MG tablet, Take by mouth., Disp: , Rfl:  .  lubiprostone (AMITIZA) 8 MCG capsule, Take 1 capsule (8 mcg total) by mouth 2 (two) times daily with a meal. (Patient not taking: Reported on 05/27/2017), Disp: 60 capsule, Rfl: 5  Allergies  Allergen Reactions  .  Penicillins Itching and Other (See Comments)  . Statins Other (See Comments)    myalgia  . Welchol [Colesevelam Hcl]      ROS  Constitutional: Negative for fever or weight change.  Respiratory: Negative for cough and shortness of breath.   Cardiovascular: Negative for chest pain or palpitations.  Gastrointestinal: Negative for abdominal pain, no bowel changes.  Musculoskeletal: Negative for gait problem or joint swelling.  Skin: Negative for rash.  Neurological: Negative for dizziness or headache.  No other specific complaints in a complete review of systems (except as listed in HPI above).  Objective  Vitals:   05/27/17 0949  BP: 132/68  Pulse: 68  Resp: 16  Temp: 97.6 F (36.4 C)  TempSrc: Oral  SpO2: 97%  Weight: 141 lb 12.8 oz (64.3 kg)  Height: 5\' 8"  (1.727 m)    Body mass index is 21.56 kg/m.  Physical Exam  Constitutional: Patient appears well-developed and thin. No distress.  HEENT: head atraumatic, normocephalic, pupils equal and reactive to light, , neck supple, throat within normal limits Cardiovascular: Normal rate, regular rhythm and normal heart sounds.  No murmur heard. No BLE edema. Pulmonary/Chest: Effort normal and breath sounds normal. No respiratory distress. Abdominal: Soft.  There is no tenderness. Psychiatric:  Patient has a normal mood and affect. behavior is normal. Judgment and thought content normal.  Recent Results (from the past 2160 hour(s))  PSA     Status: None   Collection Time: 04/16/17  8:33 AM  Result Value Ref Range   Prostate Specific Ag, Serum 1.5 0.0 - 4.0 ng/mL    Comment: Roche ECLIA methodology. According to the American Urological Association, Serum PSA should decrease and remain at undetectable levels after radical prostatectomy. The AUA defines biochemical recurrence as an initial PSA value 0.2 ng/mL or greater followed by a subsequent confirmatory PSA value 0.2 ng/mL or greater. Values obtained with different assay methods or kits cannot be used interchangeably. Results cannot be interpreted as absolute evidence of the presence or absence of malignant disease.   POCT HgB A1C     Status: Abnormal   Collection Time: 05/27/17  9:59 AM  Result Value Ref Range   Hemoglobin A1C 7.2       PHQ2/9: Depression screen [facility] 2/9 05/27/2017 01/04/2017 08/31/2016 02/28/2016 01/22/2016  Decreased Interest 0 0 0 0 0  Down, Depressed, Hopeless 0 0 0 0 0  PHQ - 2 Score 0 0 0 0 0     Fall Risk: Fall Risk  05/27/2017 01/04/2017 08/31/2016 02/28/2016 01/22/2016  Falls in the past year? No Yes No No No  Number falls in past yr: - 1 - - -  Injury with Fall? - Yes - - -    Functional Status Survey: Is the patient deaf or have difficulty hearing?: No Does the patient have difficulty seeing, even when wearing glasses/contacts?: No Does the patient have difficulty concentrating, remembering, or making decisions?: No Does the patient have difficulty walking or climbing stairs?: No Does the patient have difficulty dressing or bathing?: No Does the patient have difficulty doing errands alone such as visiting a doctor's office or shopping?: No    Assessment & Plan  1. 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 Other neutropenia ([place])  He will return fasting for all labs next visit  3. Essential hypertension  - ramipril (ALTACE) 2.5 MG capsule; Take 1 capsule (2.5 mg total) by mouth daily.  Dispense: 30 capsule; Refill: 3  4. 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

## 2017-05-27 NOTE — Patient Instructions (Signed)
Ketoconazole 2% spray over the counter for jock's itch

## 2017-06-21 ENCOUNTER — Ambulatory Visit (INDEPENDENT_AMBULATORY_CARE_PROVIDER_SITE_OTHER): Payer: Medicare Other | Admitting: Podiatry

## 2017-06-21 ENCOUNTER — Encounter: Payer: Self-pay | Admitting: Podiatry

## 2017-06-21 DIAGNOSIS — B351 Tinea unguium: Secondary | ICD-10-CM | POA: Diagnosis not present

## 2017-06-21 DIAGNOSIS — E119 Type 2 diabetes mellitus without complications: Secondary | ICD-10-CM

## 2017-06-21 DIAGNOSIS — M2010 Hallux valgus (acquired), unspecified foot: Secondary | ICD-10-CM | POA: Diagnosis not present

## 2017-06-21 DIAGNOSIS — M79676 Pain in unspecified toe(s): Secondary | ICD-10-CM

## 2017-06-21 NOTE — Patient Instructions (Signed)

## 2017-06-21 NOTE — Progress Notes (Signed)
Patient ID: Austin Hunt, male   DOB: 1941-09-15, 76 y.o.   MRN: [id number] Patient presents for diabetic shoe pick up, shoes are tried on for good fit.  Patient received 1 Pair and 3 pairs custom molded diabetic inserts.  Verbal and written break in and wear instructions given.  Patient will follow up for scheduled routine care.   Complaint:  Visit Type: Patient returns to my office for continued preventative foot care services. Complaint: Patient states" my nails have grown long and thick and become painful to walk and wear shoes" Patient has been diagnosed with DM with no foot neuropathy.. The patient presents for preventative foot care services. No changes to ROS  Podiatric Exam: Vascular: dorsalis pedis and posterior tibial pulses are palpable bilateral. Capillary return is immediate. Temperature gradient is WNL. Skin turgor WNL  Sensorium: Diminished  Semmes Weinstein monofilament test. Diminished tactile sensation bilaterally. Nail Exam: Pt has thick disfigured discolored nails with subungual debris noted bilateral entire nail hallux through fifth toenails Ulcer Exam: There is no evidence of ulcer or pre-ulcerative changes or infection. Orthopedic Exam: Muscle tone and strength are WNL. No limitations in general ROM. No crepitus or effusions noted. HAV  B/L Skin: No Porokeratosis. No infection or ulcers  Diagnosis:  Onychomycosis, , Pain in right toe, pain in left toes  Treatment & Plan Procedures and Treatment: Consent by patient was obtained for treatment procedures. The patient understood the discussion of treatment and procedures well. All questions were answered thoroughly reviewed. Debridement of mycotic and hypertrophic toenails, 1 through 5 bilateral and clearing of subungual debris. No ulceration, no infection noted.  Dispense diabetic shoes.  Patient presents today and was dispensed 0ne pair ( two units) of medically necessary extra depth shoes with three pair( six units) of custom  molded multiple density inserts. The shoes and the inserts are fitted to the patients ' feet and are noted to fit well and are free of defect.  Length and width of the shoes are also acceptable.  Patient was given written and verbal  instructions for wearing.  If any concerns arrive with the shoes or inserts, the patient is to call the office.Patient is to follow up with doctor in six weeks. Return Visit-Office Procedure: Patient instructed to return to the office for a follow up visit 3 months for continued evaluation and treatment.    Gardiner Barefoot DPM

## 2017-07-19 ENCOUNTER — Telehealth: Payer: Self-pay | Admitting: Family Medicine

## 2017-07-19 ENCOUNTER — Other Ambulatory Visit: Payer: Self-pay | Admitting: Family Medicine

## 2017-07-19 MED ORDER — KETOCONAZOLE 2 % EX CREA: 1.0000 "application " | TOPICAL_CREAM | Freq: Every day | CUTANEOUS | 0 refills | Status: DC

## 2017-07-19 NOTE — Telephone Encounter (Signed)
Pt states that Dr Ancil Boozer told him that he can get ketoconazole OTC, however, he went to [facility] and they told him that he would need a prescription. [facility] send to [facility].

## 2017-08-19 ENCOUNTER — Ambulatory Visit (INDEPENDENT_AMBULATORY_CARE_PROVIDER_SITE_OTHER): Payer: Medicare Other

## 2017-08-19 DIAGNOSIS — Z23 Encounter for immunization: Secondary | ICD-10-CM | POA: Diagnosis not present

## 2017-08-19 DIAGNOSIS — R221 Localized swelling, mass and lump, neck: Secondary | ICD-10-CM | POA: Diagnosis not present

## 2017-08-23 DIAGNOSIS — Z7952 Long term (current) use of systemic steroids: Secondary | ICD-10-CM | POA: Diagnosis not present

## 2017-08-23 DIAGNOSIS — E118 Type 2 diabetes mellitus with unspecified complications: Secondary | ICD-10-CM | POA: Diagnosis not present

## 2017-08-23 DIAGNOSIS — E7800 Pure hypercholesterolemia, unspecified: Secondary | ICD-10-CM | POA: Diagnosis not present

## 2017-08-23 DIAGNOSIS — I25118 Atherosclerotic heart disease of native coronary artery with other forms of angina pectoris: Secondary | ICD-10-CM | POA: Diagnosis not present

## 2017-08-23 DIAGNOSIS — K4191 Unilateral femoral hernia, without obstruction or gangrene, recurrent: Secondary | ICD-10-CM | POA: Diagnosis not present

## 2017-08-23 DIAGNOSIS — I10 Essential (primary) hypertension: Secondary | ICD-10-CM | POA: Diagnosis not present

## 2017-08-23 DIAGNOSIS — M353 Polymyalgia rheumatica: Secondary | ICD-10-CM | POA: Diagnosis not present

## 2017-09-02 DIAGNOSIS — H401133 Primary open-angle glaucoma, bilateral, severe stage: Secondary | ICD-10-CM | POA: Diagnosis not present

## 2017-09-09 DIAGNOSIS — H401133 Primary open-angle glaucoma, bilateral, severe stage: Secondary | ICD-10-CM | POA: Diagnosis not present

## 2017-09-09 LAB — HM DIABETES EYE EXAM

## 2017-09-10 ENCOUNTER — Encounter: Payer: Self-pay | Admitting: Family Medicine

## 2017-09-27 ENCOUNTER — Ambulatory Visit: Payer: Medicare Other | Admitting: Family Medicine

## 2017-09-27 ENCOUNTER — Ambulatory Visit: Payer: Medicare Other | Admitting: Podiatry

## 2017-10-07 ENCOUNTER — Encounter: Payer: Self-pay | Admitting: Family Medicine

## 2017-10-07 ENCOUNTER — Ambulatory Visit (INDEPENDENT_AMBULATORY_CARE_PROVIDER_SITE_OTHER): Payer: Medicare Other | Admitting: Family Medicine

## 2017-10-07 VITALS — BP 110/80 | HR 76 | Resp 14 | Ht 68.0 in | Wt 140.3 lb

## 2017-10-07 DIAGNOSIS — E559 Vitamin D deficiency, unspecified: Secondary | ICD-10-CM | POA: Diagnosis not present

## 2017-10-07 DIAGNOSIS — H42 Glaucoma in diseases classified elsewhere: Secondary | ICD-10-CM

## 2017-10-07 DIAGNOSIS — E1140 Type 2 diabetes mellitus with diabetic neuropathy, unspecified: Secondary | ICD-10-CM | POA: Diagnosis not present

## 2017-10-07 DIAGNOSIS — N521 Erectile dysfunction due to diseases classified elsewhere: Secondary | ICD-10-CM | POA: Diagnosis not present

## 2017-10-07 DIAGNOSIS — E785 Hyperlipidemia, unspecified: Secondary | ICD-10-CM

## 2017-10-07 DIAGNOSIS — E538 Deficiency of other specified B group vitamins: Secondary | ICD-10-CM

## 2017-10-07 DIAGNOSIS — I10 Essential (primary) hypertension: Secondary | ICD-10-CM | POA: Diagnosis not present

## 2017-10-07 DIAGNOSIS — B356 Tinea cruris: Secondary | ICD-10-CM

## 2017-10-07 DIAGNOSIS — E1339 Other specified diabetes mellitus with other diabetic ophthalmic complication: Secondary | ICD-10-CM | POA: Diagnosis not present

## 2017-10-07 LAB — POCT GLYCOSYLATED HEMOGLOBIN (HGB A1C): Hemoglobin A1C: 7

## 2017-10-07 MED ORDER — RAMIPRIL 2.5 MG PO CAPS: 2.5000 mg | ORAL_CAPSULE | Freq: Every day | ORAL | 1 refills | Status: DC

## 2017-10-07 MED ORDER — KETOCONAZOLE 2 % EX CREA: 1.0000 "application " | TOPICAL_CREAM | Freq: Every day | CUTANEOUS | 2 refills | Status: DC

## 2017-10-07 MED ORDER — FLUCONAZOLE 150 MG PO TABS: 150.0000 mg | ORAL_TABLET | ORAL | 0 refills | Status: DC

## 2017-10-07 NOTE — Progress Notes (Signed)
Name: Austin Hunt   MRN: [id number]    DOB: 1941/08/15   Date:10/07/2017       Progress Note  Subjective  Chief Complaint  Chief Complaint  Patient presents with  . Diabetes  . Hypertension  . Medication Refill    needs alternate cream something better than the Ketconazole cream.    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   DMII: glucose at home is only being checked fasting and is under100 -usually in the 90's  He denies polyphagia, polydipsia or polyuria. Vision is better since he had glaucoma surgery this Spring 2017 . He also has ED and sees Urologist but states Viagra did not improve symptoms. Nocturia at most once per night. He has been taking Metformin daily and glipizide and he is eating healthier, avoiding sweets. Denies hypoglycemic episodes. Taking Glipizide in am's   Leukopenia: WBC was low , but rechecked by Rheumatologist and had improved, we will recheck today  Tinea Cruris: he states he is having a flare, we will re-start cream and give him oral medication  HTN: patient is taking medication and denies side effects of medication, no dizziness or chest pain   Patient Active Problem List   Diagnosis Date Noted  . Cervical pain (neck) 10/13/2016  . 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 Long term current use of systemic steroids 01/07/2016  . Neutropenia ([place]) 01/07/2016  . Polymyalgia rheumatica ([place]) 12/24/2015  . Anxiety 04/08/2015  . 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 Chronic constipation 04/08/2015  . 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 Failure of erection 04/08/2015  . H/O inguinal hernia repair 04/08/2015  . HLD (hyperlipidemia) 04/08/2015  . 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 LBP (low back pain) 04/08/2015  . 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 Drug  intolerance 04/08/2015  . Kidney lump 04/08/2015  . TI (tricuspid incompetence) 04/08/2015  . Unilateral recurrent femoral hernia without obstruction or gangrene 05/23/2014  . Femoral hernia 05/23/2014  . Right inguinal hernia 02/06/2014  . Cataract 01/16/2014  . Glaucoma 01/16/2014  . Reflux esophagitis 06/21/2009  . Coronary atherosclerosis 04/03/2009  . Benign enlargement of prostate 02/11/2009  . Decreased libido 01/12/2008  . Benign essential HTN 09/12/2007    Past Surgical History:  Procedure Laterality Date  . APPENDECTOMY  1954  . BLADDER SURGERY  Oct 2015  . CARDIAC CATHETERIZATION  2002   1 stent  . COLONOSCOPY    . EYE SURGERY  2011, 2013   cataracts  . EYE SURGERY Right May 2016   glaucoma  . HERNIA REPAIR Left 1967  . HERNIA REPAIR Right 05-10-14   Right femoral hernia repair Dr Bary Castilla with PerFix plug, no onlay mesh..   . PHOTOCOAGULATION WITH LASER Right 12/30/2015   Procedure: PHOTOCOAGULATION WITH LASER;  Surgeon: Ronnell Freshwater, MD;  Location: [facility];  Service: Ophthalmology;  Laterality: Right;  DIABETIC - oral meds IVA BLOCK  . PROSTATE SURGERY  Oct 2015    Family History  Problem Relation Age of Onset  . Prostate cancer Neg Hx   . Bladder Cancer Neg Hx     Social History   Socioeconomic History  . Marital status: Married    Spouse name: Not on file  . Number of children: Not on file  . Years of education: Not on  file  . Highest education level: Not on file  Social Needs  . Financial resource strain: Not on file  . Food insecurity - worry: Not on file  . Food insecurity - inability: Not on file  . Transportation needs - medical: Not on file  . Transportation needs - non-medical: Not on file  Occupational History  . Not on file  Tobacco Use  . Smoking status: Never Smoker  . Smokeless tobacco: Never Used  Substance and Sexual Activity  . Alcohol use: No  . Drug use: No  . Sexual activity: Not on file  Other Topics  Concern  . Not on file  Social History Narrative  . Not on file     Current Outpatient Medications:  .  ACCU-CHEK SOFTCLIX LANCETS lancets, TEST TWICE DAILY, Disp: 100 each, Rfl: 2 .  aspirin 81 MG tablet, Take 81 mg by mouth daily. , Disp: , Rfl:  .  brimonidine-timolol (COMBIGAN) 0.2-0.5 % ophthalmic solution, Place 1 drop into both eyes every 12 (twelve) hours., Disp: , Rfl:  .  dorzolamide (TRUSOPT) 2 % ophthalmic solution, , Disp: , Rfl:  .  Garlic 10 MG CAPS, Take by mouth., Disp: , Rfl:  .  glipiZIDE (GLUCOTROL) 5 MG tablet, Before breakfast, Disp: 30 tablet, Rfl: 3 .  glucose blood (ACCU-CHEK AVIVA) test strip, Use as instructed, Disp: 100 each, Rfl: 12 .  ketoconazole (NIZORAL) 2 % cream, Apply 1 application topically daily., Disp: 15 g, Rfl: 0 .  lubiprostone (AMITIZA) 8 MCG capsule, Take 1 capsule (8 mcg total) by mouth 2 (two) times daily with a meal., Disp: 60 capsule, Rfl: 5 .  metFORMIN (GLUCOPHAGE-XR) 500 MG 24 hr tablet, TAKE 1 TABLET BY MOUTH ONCE EVERY MORNING AND 2 TABLETS ONCE EVERY EVENING, Disp: 90 tablet, Rfl: 2 .  Multiple Vitamin (MULTIVITAMIN) tablet, Take 1 tablet by mouth daily., Disp: , Rfl:  .  OMEGA-3 FATTY ACIDS PO, Take 1 tablet by mouth daily., Disp: , Rfl:  .  ramipril (ALTACE) 2.5 MG capsule, Take 1 capsule (2.5 mg total) by mouth daily., Disp: 30 capsule, Rfl: 3 .  sildenafil (VIAGRA) 100 MG tablet, Take 1 tablet (100 mg total) by mouth daily as needed for erectile dysfunction., Disp: 6 tablet, Rfl: 11 .  travoprost, benzalkonium, (TRAVATAN) 0.004 % ophthalmic solution, 1 drop at bedtime., Disp: , Rfl:  .  vitamin C (ASCORBIC ACID) 500 MG tablet, Take 500 mg by mouth daily., Disp: , Rfl:  .  calcium carbonate (OS-CAL - DOSED IN MG OF ELEMENTAL CALCIUM) 1250 (500 Ca) MG tablet, Take by mouth., Disp: , Rfl:   Allergies  Allergen Reactions  . Penicillins Itching and Other (See Comments)  . Statins Other (See Comments)    myalgia  . Welchol  [Colesevelam Hcl]      ROS  Constitutional: Negative for fever or weight change.  Respiratory: Negative for cough and shortness of breath.   Cardiovascular: Negative for chest pain or palpitations.  Gastrointestinal: Negative for abdominal pain, no bowel changes.  Musculoskeletal: Negative for gait problem or joint swelling.  Skin: Negative for rash.  Neurological: Negative for dizziness or headache.  No other specific complaints in a complete review of systems (except as listed in HPI above).  Objective  Vitals:   10/07/17 1034  BP: 110/80  Pulse: 76  Resp: 14  SpO2: 99%  Weight: 140 lb 4.8 oz (63.6 kg)  Height: 5\' 8"  (1.727 m)    Body mass index is 21.33 kg/m.  Physical Exam  Constitutional: Patient appears well-developed and well-nourished.  No distress.  HEENT: head atraumatic, normocephalic, pupils equal and reactive to light,  neck supple, throat within normal limits Cardiovascular: Normal rate, regular rhythm and normal heart sounds.  No murmur heard. No BLE edema. Pulmonary/Chest: Effort normal and breath sounds normal. No respiratory distress. Abdominal: Soft.  There is no tenderness. Psychiatric: Patient has a normal mood and affect. behavior is normal. Judgment and thought content normal.  Recent Results (from the past 2160 hour(s))  HM DIABETES EYE EXAM     Status: None   Collection Time: 09/09/17 12:00 AM  Result Value Ref Range   HM Diabetic Eye Exam No Retinopathy No Retinopathy    Comment: Dr. Wallace Going, [place]  POCT HgB A1C     Status: Abnormal   Collection Time: 10/07/17 10:38 AM  Result Value Ref Range   Hemoglobin A1C 7.0       PHQ2/9: Depression screen [facility] 2/9 05/27/2017 01/04/2017 08/31/2016 02/28/2016 01/22/2016  Decreased Interest 0 0 0 0 0  Down, Depressed, Hopeless 0 0 0 0 0  PHQ - 2 Score 0 0 0 0 0     Fall Risk: Fall Risk  10/07/2017 05/27/2017 01/04/2017 08/31/2016 02/28/2016  Falls in the past year? No No Yes No No   Number falls in past yr: - - 1 - -  Injury with Fall? - - Yes - -    Functional Status Survey: Is the patient deaf or have difficulty hearing?: No Does the patient have difficulty seeing, even when wearing glasses/contacts?: No Does the patient have difficulty concentrating, remembering, or making decisions?: No Does the patient have difficulty walking or climbing stairs?: No Does the patient have difficulty dressing or bathing?: No Does the patient have difficulty doing errands alone such as visiting a doctor's office or shopping?: No    Assessment & Plan  1. 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 Essential hypertension  - ramipril (ALTACE) 2.5 MG capsule; Take 1 capsule (2.5 mg total) by mouth daily.  Dispense: 90 capsule; Refill: 1 - COMPLETE METABOLIC PANEL WITH GFR - CBC with Differential/Platelet  4. Vitamin D deficiency  - VITAMIN D 25 Hydroxy (Vit-D Deficiency, Fractures)  5. Tinea cruris  - fluconazole (DIFLUCAN) 150 MG tablet; Take 1 tablet (150 mg total) by mouth every other day.  Dispense: 3 tablet; Refill: 0 - ketoconazole (NIZORAL) 2 % cream; Apply 1 application topically daily.  Dispense: 60 g; Refill: 2  6. B12 deficiency  - Vitamin B12  7. Dyslipidemia  - Lipid panel

## 2017-10-08 LAB — COMPLETE METABOLIC PANEL WITH GFR
AG Ratio: 1.6 (calc) (ref 1.0–2.5)
ALT: 16 U/L (ref 9–46)
AST: 25 U/L (ref 10–35)
Albumin: 4.5 g/dL (ref 3.6–5.1)
Alkaline phosphatase (APISO): 71 U/L (ref 40–115)
BUN: 16 mg/dL (ref 7–25)
CO2: 32 mmol/L (ref 20–32)
Calcium: 10 mg/dL (ref 8.6–10.3)
Chloride: 100 mmol/L (ref 98–110)
Creat: 0.87 mg/dL (ref 0.70–1.18)
GFR, Est African American: 96 mL/min/{1.73_m2} (ref >=60)
GFR, Est Non African American: 83 mL/min/{1.73_m2} (ref >=60)
Globulin: 2.9 g/dL (calc) (ref 1.9–3.7)
Glucose, Bld: 129 mg/dL — ABNORMAL HIGH (ref 65–99)
Potassium: 4.3 mmol/L (ref 3.5–5.3)
Sodium: 140 mmol/L (ref 135–146)
Total Bilirubin: 0.7 mg/dL (ref 0.2–1.2)
Total Protein: 7.4 g/dL (ref 6.1–8.1)

## 2017-10-08 LAB — CBC WITH DIFFERENTIAL/PLATELET
Basophils Absolute: 21 cells/uL (ref 0–200)
Basophils Relative: 0.6 %
Eosinophils Absolute: 130 cells/uL (ref 15–500)
Eosinophils Relative: 3.7 %
HCT: 48.2 % (ref 38.5–50.0)
Hemoglobin: 16 g/dL (ref 13.2–17.1)
Lymphs Abs: 1323 cells/uL (ref 850–3900)
MCH: 28.7 pg (ref 27.0–33.0)
MCHC: 33.2 g/dL (ref 32.0–36.0)
MCV: 86.4 fL (ref 80.0–100.0)
MPV: 10 fL (ref 7.5–12.5)
Monocytes Relative: 7.2 %
Neutro Abs: 1775 cells/uL (ref 1500–7800)
Neutrophils Relative %: 50.7 %
Platelets: 223 10*3/uL (ref 140–400)
RBC: 5.58 10*6/uL (ref 4.20–5.80)
RDW: 12.3 % (ref 11.0–15.0)
Total Lymphocyte: 37.8 %
WBC mixed population: 252 cells/uL (ref 200–950)
WBC: 3.5 10*3/uL — ABNORMAL LOW (ref 3.8–10.8)

## 2017-10-08 LAB — LIPID PANEL
Cholesterol: 252 mg/dL — ABNORMAL HIGH (ref <200)
HDL: 61 mg/dL (ref >40)
LDL Cholesterol (Calc): 169 mg/dL (calc) — ABNORMAL HIGH
Non-HDL Cholesterol (Calc): 191 mg/dL (calc) — ABNORMAL HIGH (ref <130)
Total CHOL/HDL Ratio: 4.1 (calc) (ref <5.0)
Triglycerides: 99 mg/dL (ref <150)

## 2017-10-08 LAB — VITAMIN D 25 HYDROXY (VIT D DEFICIENCY, FRACTURES): Vit D, 25-Hydroxy: 42 ng/mL (ref 30–100)

## 2017-10-08 LAB — VITAMIN B12: Vitamin B-12: 1103 pg/mL — ABNORMAL HIGH (ref 200–1100)

## 2017-10-11 ENCOUNTER — Ambulatory Visit: Payer: Medicare Other | Admitting: Podiatry

## 2017-10-14 ENCOUNTER — Other Ambulatory Visit: Payer: Self-pay | Admitting: Family Medicine

## 2017-10-14 DIAGNOSIS — N521 Erectile dysfunction due to diseases classified elsewhere: Secondary | ICD-10-CM

## 2017-10-14 DIAGNOSIS — E1140 Type 2 diabetes mellitus with diabetic neuropathy, unspecified: Principal | ICD-10-CM

## 2017-10-14 DIAGNOSIS — IMO0002 Reserved for concepts with insufficient information to code with codable children: Secondary | ICD-10-CM

## 2017-10-14 DIAGNOSIS — E1139 Type 2 diabetes mellitus with other diabetic ophthalmic complication: Secondary | ICD-10-CM

## 2017-10-14 DIAGNOSIS — E1165 Type 2 diabetes mellitus with hyperglycemia: Secondary | ICD-10-CM

## 2017-10-14 DIAGNOSIS — H42 Glaucoma in diseases classified elsewhere: Secondary | ICD-10-CM

## 2017-10-21 ENCOUNTER — Ambulatory Visit (INDEPENDENT_AMBULATORY_CARE_PROVIDER_SITE_OTHER): Payer: Medicare Other | Admitting: Podiatry

## 2017-10-21 ENCOUNTER — Encounter: Payer: Self-pay | Admitting: Podiatry

## 2017-10-21 DIAGNOSIS — M79676 Pain in unspecified toe(s): Secondary | ICD-10-CM

## 2017-10-21 DIAGNOSIS — B351 Tinea unguium: Secondary | ICD-10-CM | POA: Diagnosis not present

## 2017-10-21 DIAGNOSIS — E119 Type 2 diabetes mellitus without complications: Secondary | ICD-10-CM

## 2017-10-21 MED ORDER — DICLOFENAC SODIUM 1 % TD GEL: 2.0000 g | Freq: Four times a day (QID) | TRANSDERMAL | Status: DC

## 2017-10-21 NOTE — Progress Notes (Signed)
Complaint:  Visit Type: Patient returns to my office for continued preventative foot care services. Complaint: Patient states" my nails have grown long and thick and become painful to walk and wear shoes" Patient has been diagnosed with DM with neuropathy. The patient presents for preventative foot care services. No changes to ROS  Podiatric Exam: Vascular: dorsalis pedis and posterior tibial pulses are palpable bilateral. Capillary return is immediate. Temperature gradient is WNL. Skin turgor WNL  Sensorium: Normal Semmes Weinstein monofilament test. Normal tactile sensation bilaterally. Nail Exam: Pt has thick disfigured discolored nails with subungual debris noted bilateral entire nail hallux through fifth toenails Ulcer Exam: There is no evidence of ulcer or pre-ulcerative changes or infection. Orthopedic Exam: Muscle tone and strength are WNL. No limitations in general ROM. No crepitus or effusions noted. Foot type and digits show no abnormalities.  HAV  B/L. Bony prominence at fifth metabase. Skin: No Porokeratosis. No infection or ulcers  Diagnosis:  Onychomycosis, , Pain in right toe, pain in left toes,  Diabetes with neuropathy  HAV  B/L  Treatment & Plan Procedures and Treatment: Consent by patient was obtained for treatment procedures. The patient understood the discussion of treatment and procedures well. All questions were answered thoroughly reviewed. Debridement of mycotic and hypertrophic toenails, 1 through 5 bilateral and clearing of subungual debris. No ulceration, no infection noted.   Discussed his itching outside right foot. Prescribe  Voltaren gel. Return Visit-Office Procedure: Patient instructed to return to the office for a follow up visit 3 months for continued evaluation and treatment.    Gardiner Barefoot DPM

## 2017-10-22 ENCOUNTER — Telehealth: Payer: Self-pay | Admitting: Podiatry

## 2017-10-22 NOTE — Telephone Encounter (Signed)
I was in the office yesterday and the doctor was supposed to send a prescription to [facility] in [place]. As of now, they still have not gotten the prescription. Can someone please call me back at [phone]. Thank you and have a great day.

## 2017-10-27 ENCOUNTER — Other Ambulatory Visit: Payer: Self-pay

## 2017-10-27 MED ORDER — DICLOFENAC SODIUM 1 % TD GEL: 2.0000 g | Freq: Four times a day (QID) | TRANSDERMAL | 2 refills | Status: DC

## 2017-10-27 NOTE — Progress Notes (Signed)
voltar

## 2017-11-01 ENCOUNTER — Other Ambulatory Visit: Payer: Self-pay | Admitting: Family Medicine

## 2017-11-01 DIAGNOSIS — E1140 Type 2 diabetes mellitus with diabetic neuropathy, unspecified: Principal | ICD-10-CM

## 2017-11-01 DIAGNOSIS — E1139 Type 2 diabetes mellitus with other diabetic ophthalmic complication: Secondary | ICD-10-CM

## 2017-11-01 DIAGNOSIS — H42 Glaucoma in diseases classified elsewhere: Secondary | ICD-10-CM

## 2017-11-01 DIAGNOSIS — IMO0002 Reserved for concepts with insufficient information to code with codable children: Secondary | ICD-10-CM

## 2017-11-01 DIAGNOSIS — N521 Erectile dysfunction due to diseases classified elsewhere: Secondary | ICD-10-CM

## 2017-11-01 DIAGNOSIS — E1165 Type 2 diabetes mellitus with hyperglycemia: Secondary | ICD-10-CM

## 2017-11-19 ENCOUNTER — Other Ambulatory Visit: Payer: Self-pay | Admitting: Family Medicine

## 2017-11-19 DIAGNOSIS — E1165 Type 2 diabetes mellitus with hyperglycemia: Secondary | ICD-10-CM

## 2017-11-19 DIAGNOSIS — H42 Glaucoma in diseases classified elsewhere: Secondary | ICD-10-CM

## 2017-11-19 DIAGNOSIS — N521 Erectile dysfunction due to diseases classified elsewhere: Secondary | ICD-10-CM

## 2017-11-19 DIAGNOSIS — E1140 Type 2 diabetes mellitus with diabetic neuropathy, unspecified: Principal | ICD-10-CM

## 2017-11-19 DIAGNOSIS — IMO0002 Reserved for concepts with insufficient information to code with codable children: Secondary | ICD-10-CM

## 2017-11-19 DIAGNOSIS — E1139 Type 2 diabetes mellitus with other diabetic ophthalmic complication: Secondary | ICD-10-CM

## 2017-11-19 NOTE — Telephone Encounter (Signed)
Refill request for diabetic medication:   Metformin 500 mg  Last office visit pertaining to diabetes: 10/07/2017  Lab Results  Component Value Date   HGBA1C 7.0 10/07/2017    Follow-up on file. 02/07/2018

## 2017-12-03 ENCOUNTER — Other Ambulatory Visit: Payer: Self-pay | Admitting: Family Medicine

## 2017-12-03 DIAGNOSIS — E1165 Type 2 diabetes mellitus with hyperglycemia: Secondary | ICD-10-CM

## 2017-12-03 DIAGNOSIS — IMO0002 Reserved for concepts with insufficient information to code with codable children: Secondary | ICD-10-CM

## 2017-12-03 DIAGNOSIS — N521 Erectile dysfunction due to diseases classified elsewhere: Secondary | ICD-10-CM

## 2017-12-03 DIAGNOSIS — E1139 Type 2 diabetes mellitus with other diabetic ophthalmic complication: Secondary | ICD-10-CM

## 2017-12-03 DIAGNOSIS — E1140 Type 2 diabetes mellitus with diabetic neuropathy, unspecified: Principal | ICD-10-CM

## 2017-12-03 DIAGNOSIS — H42 Glaucoma in diseases classified elsewhere: Secondary | ICD-10-CM

## 2017-12-07 ENCOUNTER — Other Ambulatory Visit: Payer: Self-pay | Admitting: Family Medicine

## 2017-12-07 DIAGNOSIS — IMO0002 Reserved for concepts with insufficient information to code with codable children: Secondary | ICD-10-CM

## 2017-12-07 DIAGNOSIS — E1139 Type 2 diabetes mellitus with other diabetic ophthalmic complication: Secondary | ICD-10-CM

## 2017-12-07 DIAGNOSIS — N521 Erectile dysfunction due to diseases classified elsewhere: Secondary | ICD-10-CM

## 2017-12-07 DIAGNOSIS — H42 Glaucoma in diseases classified elsewhere: Secondary | ICD-10-CM

## 2017-12-07 DIAGNOSIS — E1140 Type 2 diabetes mellitus with diabetic neuropathy, unspecified: Principal | ICD-10-CM

## 2017-12-07 DIAGNOSIS — E1165 Type 2 diabetes mellitus with hyperglycemia: Secondary | ICD-10-CM

## 2017-12-21 ENCOUNTER — Telehealth: Payer: Self-pay | Admitting: Family Medicine

## 2017-12-21 NOTE — Telephone Encounter (Signed)
Pt given results per Dr Ancil Boozer, " B12 is slightly high, you can back down on supplementation to a few times a week,Fasting glucose is at goal, normal kidney and liver function test' WBC is stable, No anemia, Lipid panel showed high bad cholesterol, normal good cholesterol. He should take a statin because of his risk factors but he is allergic, keep DM under control, exercise, eat healthy and continue aspirin, Normal vitamin D level"; pt verbalizes understanding; unable to chart in result note because encounter closed due to pt not calling back for lab results within 3 days.

## 2018-01-03 ENCOUNTER — Other Ambulatory Visit: Payer: Self-pay | Admitting: Family Medicine

## 2018-01-03 ENCOUNTER — Telehealth: Payer: Self-pay | Admitting: Family Medicine

## 2018-01-03 MED ORDER — GLIPIZIDE 5 MG PO TABS: 2.5000 mg | ORAL_TABLET | Freq: Every day | ORAL | 1 refills | Status: DC

## 2018-01-03 NOTE — Telephone Encounter (Signed)
Copied from [place]. Topic: Quick Communication - Rx Refill/Question >> Jan 03, 2018  9:36 AM Arletha Grippe wrote: Medication: glipizide    Has the patient contacted their pharmacy? Yes.     (Agent: If no, request that the patient contact the pharmacy for the refill.)   Preferred Pharmacy (with phone number or street name): [facility]  Also, pt was told that he was taken off glipizide and doesn't understand that    Agent: Please be advised that RX refills may take up to 3 business days. We ask that you follow-up with your pharmacy.

## 2018-01-03 NOTE — Telephone Encounter (Signed)
I will send 5 mg to take half to one in am

## 2018-01-03 NOTE — Telephone Encounter (Signed)
Note from 10/07/17 is not cleared what medications he should be taking for DM. In the note states he takes Glipizide in the morning but in the note-it states change in therapy and was D/C on this visit. Please advise the patient is confused.

## 2018-01-05 ENCOUNTER — Telehealth: Payer: Self-pay | Admitting: Family Medicine

## 2018-01-05 NOTE — Telephone Encounter (Signed)
Called and left patient a message stating his Glipizide was sent in on 01/03/2018 and [facility] stated the medication has been ready for pick up since yesterday.

## 2018-01-07 DIAGNOSIS — H401133 Primary open-angle glaucoma, bilateral, severe stage: Secondary | ICD-10-CM | POA: Diagnosis not present

## 2018-01-20 ENCOUNTER — Encounter: Payer: Self-pay | Admitting: Podiatry

## 2018-01-20 ENCOUNTER — Ambulatory Visit (INDEPENDENT_AMBULATORY_CARE_PROVIDER_SITE_OTHER): Payer: Medicare Other | Admitting: Podiatry

## 2018-01-20 DIAGNOSIS — M2010 Hallux valgus (acquired), unspecified foot: Secondary | ICD-10-CM

## 2018-01-20 DIAGNOSIS — B351 Tinea unguium: Secondary | ICD-10-CM

## 2018-01-20 DIAGNOSIS — E119 Type 2 diabetes mellitus without complications: Secondary | ICD-10-CM

## 2018-01-20 DIAGNOSIS — M79676 Pain in unspecified toe(s): Secondary | ICD-10-CM | POA: Diagnosis not present

## 2018-01-20 NOTE — Progress Notes (Signed)
Complaint:  Visit Type: Patient returns to my office for continued preventative foot care services. Complaint: Patient states" my nails have grown long and thick and become painful to walk and wear shoes" Patient has been diagnosed with DM with neuropathy. The patient presents for preventative foot care services. No changes to ROS  Podiatric Exam: Vascular: dorsalis pedis and posterior tibial pulses are palpable bilateral. Capillary return is immediate. Temperature gradient is WNL. Skin turgor WNL  Sensorium: Normal Semmes Weinstein monofilament test. Normal tactile sensation bilaterally. Nail Exam: Pt has thick disfigured discolored nails with subungual debris noted bilateral entire nail hallux through fifth toenails Ulcer Exam: There is no evidence of ulcer or pre-ulcerative changes or infection. Orthopedic Exam: Muscle tone and strength are WNL. No limitations in general ROM. No crepitus or effusions noted. Foot type and digits show no abnormalities.  HAV  B/L. Bony prominence at fifth metabase. Skin: No Porokeratosis. No infection or ulcers  Diagnosis:  Onychomycosis, , Pain in right toe, pain in left toes,  Diabetes with neuropathy  HAV  B/L  Treatment & Plan Procedures and Treatment: Consent by patient was obtained for treatment procedures. The patient understood the discussion of treatment and procedures well. All questions were answered thoroughly reviewed. Debridement of mycotic and hypertrophic toenails, 1 through 5 bilateral and clearing of subungual debris. No ulceration, no infection noted.   Discussed his itching and the itching has resolved using only vaseline.  ABN signed for 2019. Return Visit-Office Procedure: Patient instructed to return to the office for a follow up visit 3 months for continued evaluation and treatment.    Lucilla Petrenko DPM 

## 2018-01-28 DIAGNOSIS — M353 Polymyalgia rheumatica: Secondary | ICD-10-CM | POA: Diagnosis not present

## 2018-02-07 ENCOUNTER — Ambulatory Visit (INDEPENDENT_AMBULATORY_CARE_PROVIDER_SITE_OTHER): Payer: Medicare Other | Admitting: Family Medicine

## 2018-02-07 ENCOUNTER — Encounter: Payer: Self-pay | Admitting: Family Medicine

## 2018-02-07 VITALS — BP 120/80 | HR 69 | Temp 97.9°F | Ht 69.0 in | Wt 140.0 lb

## 2018-02-07 DIAGNOSIS — E1139 Type 2 diabetes mellitus with other diabetic ophthalmic complication: Secondary | ICD-10-CM | POA: Diagnosis not present

## 2018-02-07 DIAGNOSIS — E1136 Type 2 diabetes mellitus with diabetic cataract: Secondary | ICD-10-CM

## 2018-02-07 DIAGNOSIS — H42 Glaucoma in diseases classified elsewhere: Secondary | ICD-10-CM

## 2018-02-07 DIAGNOSIS — E785 Hyperlipidemia, unspecified: Secondary | ICD-10-CM | POA: Diagnosis not present

## 2018-02-07 DIAGNOSIS — N521 Erectile dysfunction due to diseases classified elsewhere: Secondary | ICD-10-CM

## 2018-02-07 DIAGNOSIS — E1140 Type 2 diabetes mellitus with diabetic neuropathy, unspecified: Secondary | ICD-10-CM | POA: Diagnosis not present

## 2018-02-07 DIAGNOSIS — I10 Essential (primary) hypertension: Secondary | ICD-10-CM | POA: Diagnosis not present

## 2018-02-07 DIAGNOSIS — D708 Other neutropenia: Secondary | ICD-10-CM

## 2018-02-07 DIAGNOSIS — E538 Deficiency of other specified B group vitamins: Secondary | ICD-10-CM | POA: Diagnosis not present

## 2018-02-07 DIAGNOSIS — C61 Malignant neoplasm of prostate: Secondary | ICD-10-CM | POA: Insufficient documentation

## 2018-02-07 LAB — POCT GLYCOSYLATED HEMOGLOBIN (HGB A1C): Hemoglobin A1C: 7.9

## 2018-02-07 MED ORDER — GLIPIZIDE 5 MG PO TABS: 2.5000 mg | ORAL_TABLET | Freq: Every day | ORAL | 1 refills | Status: DC

## 2018-02-07 NOTE — Progress Notes (Signed)
Name: Austin Hunt   MRN: [id number]    DOB: 04-24-1941   Date:02/07/2018       Progress Note  Subjective  Chief Complaint  Chief Complaint  Patient presents with  . Medication Refill    4 month F/U  . Diabetes  . Hypertension    HPI  DMII: glucose at home is only being checked fasting, we stopped Glipizide because glucose was below 100, but he asked to go back on medication because glucose was spiking - up to 200, he has been back on medication for the past month and states glucose is low 100's -108/109  He denies polyphagia, polydipsia or polyuria. Vision is better since he had glaucoma surgery thisSpring 201 he has also had cataract surgery . He also has ED and sees Urologist and has rx of Viagra at home. Nocturia at most once per night.He has been taking Metformin also. No side effects of medication  Leukopenia: WBC was low , but stable, discussed repeating labs but he would like to hold off on repeating labs right now  BPH and prostate cancer: he is seeing Urologist and denies any obstructive symptoms, except for nocturia times one at most.   Tinea Cruris: he is doing better with topical medication   HTN: patient is taking medication and denies side effects of medication, he asked for brand necessary but explained no need, no dizziness or chest pain  Dyslipidemia: he is not on statin therapy, he refuses medication - myalgia in the past   Patient Active Problem List   Diagnosis Date Noted  . Prostate cancer ([place]) 02/07/2018  . Cervical pain (neck) 10/13/2016  . 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 Long term current use of systemic steroids 01/07/2016  . Neutropenia ([place]) 01/07/2016  . Anxiety 04/08/2015  . 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 Chronic constipation 04/08/2015  . 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 Failure of erection 04/08/2015  . H/O inguinal hernia repair 04/08/2015  . HLD (hyperlipidemia) 04/08/2015  .  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 LBP (low back pain) 04/08/2015  . 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 Drug intolerance 04/08/2015  . Kidney lump 04/08/2015  . TI (tricuspid incompetence) 04/08/2015  . Unilateral recurrent femoral hernia without obstruction or gangrene 05/23/2014  . Femoral hernia 05/23/2014  . Right inguinal hernia 02/06/2014  . Cataract 01/16/2014  . Glaucoma 01/16/2014  . Reflux esophagitis 06/21/2009  . Coronary atherosclerosis 04/03/2009  . Benign enlargement of prostate 02/11/2009  . Decreased libido 01/12/2008  . Benign essential HTN 09/12/2007    Past Surgical History:  Procedure Laterality Date  . APPENDECTOMY  1954  . BLADDER SURGERY  Oct 2015  . CARDIAC CATHETERIZATION  2002   1 stent  . COLONOSCOPY    . EYE SURGERY  2011, 2013   cataracts  . EYE SURGERY Right May 2016   glaucoma  . HERNIA REPAIR Left 1967  . HERNIA REPAIR Right 05-10-14   Right femoral hernia repair Dr Bary Castilla with PerFix plug, no onlay mesh..   . PHOTOCOAGULATION WITH LASER Right 12/30/2015   Procedure: PHOTOCOAGULATION WITH LASER;  Surgeon: Ronnell Freshwater, MD;  Location: [facility];  Service: Ophthalmology;  Laterality: Right;  DIABETIC - oral meds IVA BLOCK  . PROSTATE SURGERY  Oct 2015    Family History  Problem Relation Age of Onset  . Prostate cancer Neg Hx   . Bladder Cancer Neg Hx     Social History  Socioeconomic History  . Marital status: Married    Spouse name: Not on file  . Number of children: Not on file  . Years of education: Not on file  . Highest education level: Not on file  Occupational History  . Not on file  Social Needs  . Financial resource strain: Not on file  . Food insecurity:    Worry: Not on file    Inability: Not on file  . Transportation needs:    Medical: Not on file    Non-medical: Not on file  Tobacco Use  . Smoking status: Never Smoker  . Smokeless tobacco: Never Used  Substance and Sexual Activity  .  Alcohol use: No  . Drug use: No  . Sexual activity: Not on file  Lifestyle  . Physical activity:    Days per week: Not on file    Minutes per session: Not on file  . Stress: Not on file  Relationships  . Social connections:    Talks on phone: Not on file    Gets together: Not on file    Attends religious service: Not on file    Active member of club or organization: Not on file    Attends meetings of clubs or organizations: Not on file    Relationship status: Not on file  . Intimate partner violence:    Fear of current or ex partner: Not on file    Emotionally abused: Not on file    Physically abused: Not on file    Forced sexual activity: Not on file  Other Topics Concern  . Not on file  Social History Narrative  . Not on file     Current Outpatient Medications:  .  ACCU-CHEK SOFTCLIX LANCETS lancets, TEST TWICE DAILY, Disp: 100 each, Rfl: 2 .  aspirin 81 MG tablet, Take 81 mg by mouth daily. , Disp: , Rfl:  .  brimonidine-timolol (COMBIGAN) 0.2-0.5 % ophthalmic solution, Place 1 drop into both eyes every 12 (twelve) hours., Disp: , Rfl:  .  calcium carbonate (OS-CAL - DOSED IN MG OF ELEMENTAL CALCIUM) 1250 (500 Ca) MG tablet, Take by mouth., Disp: , Rfl:  .  diclofenac sodium (VOLTAREN) 1 % GEL, Apply 2 g topically 4 (four) times daily., Disp: 100 g, Rfl: 2 .  dorzolamide (TRUSOPT) 2 % ophthalmic solution, , Disp: , Rfl:  .  Garlic 10 MG CAPS, Take by mouth., Disp: , Rfl:  .  glipiZIDE (GLUCOTROL) 5 MG tablet, Take 0.5-1 tablets (2.5-5 mg total) by mouth daily before breakfast., Disp: 90 tablet, Rfl: 1 .  glucose blood (ACCU-CHEK AVIVA) test strip, Use as instructed, Disp: 100 each, Rfl: 12 .  ketoconazole (NIZORAL) 2 % cream, Apply 1 application topically daily., Disp: 60 g, Rfl: 2 .  lubiprostone (AMITIZA) 8 MCG capsule, Take 1 capsule (8 mcg total) by mouth 2 (two) times daily with a meal., Disp: 60 capsule, Rfl: 5 .  metFORMIN (GLUCOPHAGE-XR) 500 MG 24 hr tablet, TAKE 1  TABLET BY MOUTH ONCE EVERY MORNING AND 2 TABLETS ONCE EVERY EVENING, Disp: 90 tablet, Rfl: 2 .  Multiple Vitamin (MULTIVITAMIN) tablet, Take 1 tablet by mouth daily., Disp: , Rfl:  .  OMEGA-3 FATTY ACIDS PO, Take 1 tablet by mouth daily., Disp: , Rfl:  .  ramipril (ALTACE) 2.5 MG capsule, Take 1 capsule (2.5 mg total) by mouth daily., Disp: 90 capsule, Rfl: 1 .  travoprost, benzalkonium, (TRAVATAN) 0.004 % ophthalmic solution, 1 drop at bedtime., Disp: , Rfl:  .  vitamin C (ASCORBIC ACID) 500 MG tablet, Take 500 mg by mouth daily., Disp: , Rfl:  .  Zinc Acetate (GALZIN) 50 MG CAPS, Take 1 capsule by mouth 3 (three) times a week., Disp: , Rfl:   Current Facility-Administered Medications:  .  diclofenac sodium (VOLTAREN) 1 % transdermal gel 2 g, 2 g, Topical, QID, Gardiner Barefoot, DPM  Allergies  Allergen Reactions  . Colesevelam Other (See Comments)  . Penicillins Itching and Other (See Comments)  . Statins Other (See Comments)    myalgia  . Welchol [Colesevelam Hcl]      ROS  Constitutional: Negative for fever or weight change.  Respiratory: Negative for cough and shortness of breath.   Cardiovascular: Negative for chest pain or palpitations.  Gastrointestinal: Negative for abdominal pain, no bowel changes.  Musculoskeletal: Negative for gait problem or joint swelling.  Skin: Negative for rash.  Neurological: Negative for dizziness or headache.  No other specific complaints in a complete review of systems (except as listed in HPI above).  Objective  Vitals:   02/07/18 0918  BP: 120/80  Pulse: 69  SpO2: 96%    There is no height or weight on file to calculate BMI.  Physical Exam  Constitutional: Patient appears well-developed and well-nourished.  No distress.  HEENT: head atraumatic, normocephalic, pupils equal and reactive to light, neck supple, throat within normal limits Cardiovascular: Normal rate, regular rhythm and normal heart sounds.  No murmur heard. No BLE  edema. Pulmonary/Chest: Effort normal and breath sounds normal. No respiratory distress. Abdominal: Soft.  There is no tenderness. Psychiatric: Patient has a normal mood and affect. behavior is normal. Judgment and thought content normal.  Recent Results (from the past 2160 hour(s))  POCT HgB A1C     Status: Abnormal   Collection Time: 02/07/18  9:22 AM  Result Value Ref Range   Hemoglobin A1C 7.9      PHQ2/9: Depression screen [facility] 2/9 05/27/2017 01/04/2017 08/31/2016 02/28/2016 01/22/2016  Decreased Interest 0 0 0 0 0  Down, Depressed, Hopeless 0 0 0 0 0  PHQ - 2 Score 0 0 0 0 0    Fall Risk: Fall Risk  02/07/2018 10/07/2017 05/27/2017 01/04/2017 08/31/2016  Falls in the past year? No No No Yes No  Number falls in past yr: - - - 1 -  Injury with Fall? - - - Yes -     Assessment & Plan  1. 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  Dispense: 90 tablet; Refill: 1 He asked to go back on Glipizide because glucose was spiking, advised to take half pill and monitor, keep hgbA1C between 7-8 should be good for him based on his age  77. Other neutropenia ([place])  Recheck next visit   3. Prostate cancer [facility])  Seeing Urologist, and getting PSA monitored  4. Dyslipidemia  Not on statin therapy   5. Essential hypertension  At goal with medication   6. B12 deficiency  Discussed otc supplementation to go down to a few times weekly   7. 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  Dispense: 90 tablet; Refill: 1

## 2018-02-08 ENCOUNTER — Encounter: Payer: Self-pay | Admitting: Family Medicine

## 2018-02-17 DIAGNOSIS — R221 Localized swelling, mass and lump, neck: Secondary | ICD-10-CM | POA: Diagnosis not present

## 2018-02-21 ENCOUNTER — Other Ambulatory Visit: Payer: Self-pay | Admitting: Family Medicine

## 2018-02-21 ENCOUNTER — Other Ambulatory Visit: Payer: Self-pay

## 2018-02-21 DIAGNOSIS — IMO0002 Reserved for concepts with insufficient information to code with codable children: Secondary | ICD-10-CM

## 2018-02-21 DIAGNOSIS — E1165 Type 2 diabetes mellitus with hyperglycemia: Secondary | ICD-10-CM

## 2018-02-21 DIAGNOSIS — E1140 Type 2 diabetes mellitus with diabetic neuropathy, unspecified: Principal | ICD-10-CM

## 2018-02-21 DIAGNOSIS — H42 Glaucoma in diseases classified elsewhere: Secondary | ICD-10-CM

## 2018-02-21 DIAGNOSIS — E1139 Type 2 diabetes mellitus with other diabetic ophthalmic complication: Secondary | ICD-10-CM

## 2018-02-21 DIAGNOSIS — N521 Erectile dysfunction due to diseases classified elsewhere: Secondary | ICD-10-CM

## 2018-02-21 MED ORDER — METFORMIN HCL ER 500 MG PO TB24: 1500.0000 mg | ORAL_TABLET | Freq: Every day | ORAL | 2 refills | Status: DC

## 2018-02-23 ENCOUNTER — Other Ambulatory Visit: Payer: Self-pay

## 2018-02-23 DIAGNOSIS — K5909 Other constipation: Secondary | ICD-10-CM

## 2018-02-23 MED ORDER — LUBIPROSTONE 8 MCG PO CAPS: 8.0000 ug | ORAL_CAPSULE | Freq: Two times a day (BID) | ORAL | 5 refills | Status: DC

## 2018-02-23 NOTE — Telephone Encounter (Signed)
Refill request for general medication. Amitiza to [name].  Last office visit: 02/07/2018   Follow up on 05/24/2018   Patient states his prescription ran out and was no longer valid. Please sent to [facility].

## 2018-03-01 ENCOUNTER — Other Ambulatory Visit: Payer: Self-pay | Admitting: Family Medicine

## 2018-03-01 NOTE — Telephone Encounter (Signed)
Refill request for diabetic medication:   Accu-chek softclix lancets  Last office visit pertaining to diabetes: 02/07/2018  Lab Results  Component Value Date   HGBA1C 7.9 02/07/2018     Follow-ups on file. 05/24/2018

## 2018-04-12 ENCOUNTER — Other Ambulatory Visit: Payer: Medicare Other

## 2018-04-12 ENCOUNTER — Other Ambulatory Visit: Payer: Self-pay

## 2018-04-12 DIAGNOSIS — C61 Malignant neoplasm of prostate: Secondary | ICD-10-CM | POA: Diagnosis not present

## 2018-04-13 LAB — PSA: Prostate Specific Ag, Serum: 1.2 ng/mL (ref 0.0–4.0)

## 2018-04-15 ENCOUNTER — Encounter: Payer: Self-pay | Admitting: Urology

## 2018-04-15 ENCOUNTER — Ambulatory Visit (INDEPENDENT_AMBULATORY_CARE_PROVIDER_SITE_OTHER): Payer: Medicare Other | Admitting: Urology

## 2018-04-15 VITALS — BP 144/74 | HR 65 | Resp 16 | Ht 69.0 in | Wt 142.0 lb

## 2018-04-15 DIAGNOSIS — N5203 Combined arterial insufficiency and corporo-venous occlusive erectile dysfunction: Secondary | ICD-10-CM

## 2018-04-15 DIAGNOSIS — C61 Malignant neoplasm of prostate: Secondary | ICD-10-CM | POA: Diagnosis not present

## 2018-04-15 MED ORDER — VIAGRA 100 MG PO TABS: 100.0000 mg | ORAL_TABLET | Freq: Every day | ORAL | 11 refills | Status: DC | PRN

## 2018-04-15 NOTE — Progress Notes (Signed)
04/15/2018 10:48 AM   Austin Hunt July 17, 1941 [id number]  Referring provider: [name], MD [address] [place], [place] [address]  No chief complaint on file.   HPI:  77 year old male with a history of BPH and incidental prostate cancer.   He underwent TURP on 08/27/2014 no longer on Flomax and dutasteride. Prior to surgery, he did have a history of elevated postvoid residuals as well as bladder stones.  Pathology at the time of TURP showed incidental small focus of Gleason 3+3 prostate cancer. His PSA at the time of diagnosis was 0.7 on dutasteride.   No voiding complaints today.   He is no longer taking any BPH medications and quite pleased.  He get up once a night to void. This is unchanged.     He returns today for rectal exam/PSA as well as reassessment of his voiding symptoms.    Most recent PSA on 04/12/2018 1.2, has stabilized.  No weight loss or bone pain.  He does have erectile dysfunction.  He is using sildenafil but prefers name brand.  He would like a prescription for this today.  PMH: Past Medical History:  Diagnosis Date  . 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 Balanitis   . BPH (benign prostatic hypertrophy) with urinary obstruction   . 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 Diabetes mellitus without complication ([place])   . Glaucoma   . Headache    rare - 1x/mo  . Nocturia   . Prostate cancer [facility])     Surgical History: Past Surgical History:  Procedure Laterality Date  . APPENDECTOMY  1954  . BLADDER SURGERY  Oct 2015  . CARDIAC CATHETERIZATION  2002   1 stent  . COLONOSCOPY    . EYE SURGERY  2011, 2013   cataracts  . EYE SURGERY Right May 2016   glaucoma  . HERNIA REPAIR Left 1967  . HERNIA REPAIR Right 05-10-14   Right femoral hernia repair Dr Bary Castilla with PerFix plug, no onlay mesh..   . PHOTOCOAGULATION WITH LASER Right 12/30/2015   Procedure: PHOTOCOAGULATION WITH LASER;  Surgeon: Ronnell Freshwater, MD;  Location: [facility];  Service: Ophthalmology;  Laterality: Right;  DIABETIC - oral meds IVA BLOCK  . PROSTATE SURGERY  Oct 2015    Home Medications:  Allergies as of 04/15/2018      Reactions   Colesevelam Other (See Comments)   Penicillins Itching, Other (See Comments)   Statins Other (See Comments)   myalgia   Welchol [colesevelam Hcl]       Medication List        Accurate as of 04/15/18 10:48 AM. Always use your most recent med list.          ACCU-CHEK SOFTCLIX LANCETS lancets TEST TWICE DAILY   aspirin 81 MG tablet Take 81 mg by mouth daily.   calcium carbonate 1250 (500 Ca) MG tablet Commonly known as:  OS-CAL - dosed in mg of elemental calcium Take by mouth.   COMBIGAN 0.2-0.5 % ophthalmic solution Generic drug:  brimonidine-timolol Place 1 drop into both eyes every 12 (twelve) hours.   diclofenac sodium 1 % Gel Commonly known as:  VOLTAREN Apply 2 g topically 4 (four) times daily.   dorzolamide 2 % ophthalmic solution Commonly known as:  TRUSOPT   GALZIN 50 MG Caps Generic drug:  Zinc Acetate Take 1 capsule by mouth 3 (three) times a week.   Garlic 10 MG Caps Take by mouth.  glipiZIDE 5 MG tablet Commonly known as:  GLUCOTROL Take 0.5-1 tablets (2.5-5 mg total) by mouth daily before breakfast.   glucose blood test strip Commonly known as:  ACCU-CHEK AVIVA Use as instructed   ketoconazole 2 % cream Commonly known as:  NIZORAL Apply 1 application topically daily.   lubiprostone 8 MCG capsule Commonly known as:  AMITIZA Take 1 capsule (8 mcg total) by mouth 2 (two) times daily with a meal.   metFORMIN 500 MG 24 hr tablet Commonly known as:  GLUCOPHAGE-XR Take 3 tablets (1,500 mg total) by mouth daily. 2 in am and one at night   multivitamin tablet Take 1 tablet by mouth daily.   OMEGA-3 FATTY ACIDS PO Take 1 tablet by mouth daily.   ramipril 2.5 MG capsule Commonly known as:  ALTACE Take 1 capsule (2.5 mg total) by mouth daily.   travoprost  (benzalkonium) 0.004 % ophthalmic solution Commonly known as:  TRAVATAN 1 drop at bedtime.   VIAGRA 100 MG tablet Generic drug:  sildenafil Take 1 tablet (100 mg total) by mouth daily as needed for erectile dysfunction.   vitamin C 500 MG tablet Commonly known as:  ASCORBIC ACID Take 500 mg by mouth daily.       Allergies:  Allergies  Allergen Reactions  . Colesevelam Other (See Comments)  . Penicillins Itching and Other (See Comments)  . Statins Other (See Comments)    myalgia  . Welchol [Colesevelam Hcl]     Family History: Family History  Problem Relation Age of Onset  . Prostate cancer Neg Hx   . Bladder Cancer Neg Hx     Social History:  reports that he has never smoked. He has never used smokeless tobacco. He reports that he does not drink alcohol or use drugs.  ROS: UROLOGY Frequent Urination?: No Hard to postpone urination?: No Burning/pain with urination?: No Get up at night to urinate?: No Leakage of urine?: No Urine stream starts and stops?: No Trouble starting stream?: No Do you have to strain to urinate?: No Blood in urine?: No Urinary tract infection?: No Sexually transmitted disease?: No Injury to kidneys or bladder?: No Painful intercourse?: No Weak stream?: No Erection problems?: No Penile pain?: No  Gastrointestinal Nausea?: No Vomiting?: No Indigestion/heartburn?: No Diarrhea?: No Constipation?: No  Constitutional Fever: No Night sweats?: No Weight loss?: No Fatigue?: No  Skin Skin rash/lesions?: No Itching?: No  Eyes Blurred vision?: No Double vision?: No  Ears/Nose/Throat Sore throat?: No Sinus problems?: No  Hematologic/Lymphatic Swollen glands?: No Easy bruising?: No  Cardiovascular Leg swelling?: No Chest pain?: No  Respiratory Cough?: No Shortness of breath?: No  Endocrine Excessive thirst?: No  Musculoskeletal Back pain?: No Joint pain?: No  Neurological Headaches?: No Dizziness?:  No  Psychologic Depression?: No Anxiety?: No  Physical Exam: BP (!) 144/74   Pulse 65   Resp 16   Ht 5\' 9"  (1.753 m)   Wt 142 lb (64.4 kg)   SpO2 96%   BMI 20.97 kg/m   Constitutional:  Alert and oriented, No acute distress. HEENT: [place] AT, moist mucus membranes.  Trachea midline, no masses. Cardiovascular: No clubbing, cyanosis, or edema. Respiratory: Normal respiratory effort, no increased work of breathing. Rectal: normal sphincter tone, 40 cc prostate, nontender, no nodues Skin: No rashes, bruises or suspicious lesions. Neurologic: Grossly intact, no focal deficits, moving all 4 extremities. Psychiatric: Normal mood and affect.  Laboratory Data: Lab Results  Component Value Date   WBC 3.5 (L) 10/07/2017  HGB 16.0 10/07/2017   HCT 48.2 10/07/2017   MCV 86.4 10/07/2017   PLT 223 10/07/2017    Lab Results  Component Value Date   CREATININE 0.87 10/07/2017    Lab Results  Component Value Date   HGBA1C 7.9 02/07/2018   Component     Latest Ref Rng & Units 10/04/2015 04/03/2016 10/08/2016 04/16/2017  Prostate Specific Ag, Serum     0.0 - 4.0 ng/mL 1.1 1.2 1.7 1.5   Component     Latest Ref Rng & Units 04/12/2018  Prostate Specific Ag, Serum     0.0 - 4.0 ng/mL 1.2     Urinalysis NA  Pertinent Imaging: n/a  Assessment & Plan:    1. Prostate cancer [facility]) Incidental small focus of Gleason 3+3 prostate cancer for TURP 08/2014, iPSA 0.7 on dutasteride.  Recommend continued active surveillance given age and pathology PSA stable Annual DRE/ PSA  2. Combined arterial insufficiency and corporo-venous occlusive erectile dysfunction Requests name brand Viagra   Return in about 1 year (around 04/16/2019) for PSA/ DRE.  Hollice Espy, MD  [facility] [address] [place], [place] [address] [phone]

## 2018-04-25 ENCOUNTER — Encounter: Payer: Self-pay | Admitting: Podiatry

## 2018-04-25 ENCOUNTER — Ambulatory Visit (INDEPENDENT_AMBULATORY_CARE_PROVIDER_SITE_OTHER): Payer: Medicare Other | Admitting: Podiatry

## 2018-04-25 DIAGNOSIS — E119 Type 2 diabetes mellitus without complications: Secondary | ICD-10-CM

## 2018-04-25 DIAGNOSIS — M79676 Pain in unspecified toe(s): Secondary | ICD-10-CM

## 2018-04-25 DIAGNOSIS — B351 Tinea unguium: Secondary | ICD-10-CM | POA: Diagnosis not present

## 2018-04-25 DIAGNOSIS — M2010 Hallux valgus (acquired), unspecified foot: Secondary | ICD-10-CM

## 2018-04-25 LAB — HM DIABETES FOOT EXAM
HM Diabetic Foot Exam: ABNORMAL
HM Diabetic Foot Exam: ABNORMAL

## 2018-04-25 NOTE — Progress Notes (Signed)
Complaint:  Visit Type: Patient returns to my office for continued preventative foot care services. Complaint: Patient states" my nails have grown long and thick and become painful to walk and wear shoes" Patient has been diagnosed with DM with neuropathy. The patient presents for preventative foot care services. No changes to ROS  Podiatric Exam: Vascular: dorsalis pedis and posterior tibial pulses are palpable bilateral. Capillary return is immediate. Temperature gradient is WNL. Skin turgor WNL  Sensorium: Normal Semmes Weinstein monofilament test. Normal tactile sensation bilaterally. Nail Exam: Pt has thick disfigured discolored nails with subungual debris noted bilateral entire nail hallux through fifth toenails Ulcer Exam: There is no evidence of ulcer or pre-ulcerative changes or infection. Orthopedic Exam: Muscle tone and strength are WNL. No limitations in general ROM. No crepitus or effusions noted. Foot type and digits show no abnormalities.  HAV  B/L. Bony prominence at fifth metabase. Skin: No Porokeratosis. No infection or ulcers  Diagnosis:  Onychomycosis, , Pain in right toe, pain in left toes,  Diabetes with neuropathy  HAV  B/L  Treatment & Plan Procedures and Treatment: Consent by patient was obtained for treatment procedures. The patient understood the discussion of treatment and procedures well. All questions were answered thoroughly reviewed. Debridement of mycotic and hypertrophic toenails, 1 through 5 bilateral and clearing of subungual debris. No ulceration, no infection noted.   Discussed his itching and the itching has resolved using only vaseline.  ABN signed for 2019. Return Visit-Office Procedure: Patient instructed to return to the office for a follow up visit 3 months for continued evaluation and treatment.    Gardiner Barefoot DPM

## 2018-04-26 DIAGNOSIS — M5030 Other cervical disc degeneration, unspecified cervical region: Secondary | ICD-10-CM | POA: Diagnosis not present

## 2018-04-26 DIAGNOSIS — M9901 Segmental and somatic dysfunction of cervical region: Secondary | ICD-10-CM | POA: Diagnosis not present

## 2018-04-26 DIAGNOSIS — R51 Headache: Secondary | ICD-10-CM | POA: Diagnosis not present

## 2018-04-26 DIAGNOSIS — M5413 Radiculopathy, cervicothoracic region: Secondary | ICD-10-CM | POA: Diagnosis not present

## 2018-04-29 DIAGNOSIS — M9901 Segmental and somatic dysfunction of cervical region: Secondary | ICD-10-CM | POA: Diagnosis not present

## 2018-04-29 DIAGNOSIS — M5030 Other cervical disc degeneration, unspecified cervical region: Secondary | ICD-10-CM | POA: Diagnosis not present

## 2018-04-29 DIAGNOSIS — M5413 Radiculopathy, cervicothoracic region: Secondary | ICD-10-CM | POA: Diagnosis not present

## 2018-04-29 DIAGNOSIS — R51 Headache: Secondary | ICD-10-CM | POA: Diagnosis not present

## 2018-05-03 ENCOUNTER — Other Ambulatory Visit: Payer: Self-pay | Admitting: Family Medicine

## 2018-05-03 NOTE — Telephone Encounter (Signed)
Refill request for diabetic medication:   Test Strips  Last office visit pertaining to diabetes: 02/07/2018  Lab Results  Component Value Date   HGBA1C 7.9 02/07/2018    Follow-ups on file. 05/24/2018

## 2018-05-04 DIAGNOSIS — M5030 Other cervical disc degeneration, unspecified cervical region: Secondary | ICD-10-CM | POA: Diagnosis not present

## 2018-05-04 DIAGNOSIS — M9901 Segmental and somatic dysfunction of cervical region: Secondary | ICD-10-CM | POA: Diagnosis not present

## 2018-05-04 DIAGNOSIS — M5413 Radiculopathy, cervicothoracic region: Secondary | ICD-10-CM | POA: Diagnosis not present

## 2018-05-04 DIAGNOSIS — R51 Headache: Secondary | ICD-10-CM | POA: Diagnosis not present

## 2018-05-09 DIAGNOSIS — H401133 Primary open-angle glaucoma, bilateral, severe stage: Secondary | ICD-10-CM | POA: Diagnosis not present

## 2018-05-17 ENCOUNTER — Other Ambulatory Visit: Payer: Self-pay | Admitting: Family Medicine

## 2018-05-17 DIAGNOSIS — H42 Glaucoma in diseases classified elsewhere: Secondary | ICD-10-CM

## 2018-05-17 DIAGNOSIS — E1140 Type 2 diabetes mellitus with diabetic neuropathy, unspecified: Principal | ICD-10-CM

## 2018-05-17 DIAGNOSIS — IMO0002 Reserved for concepts with insufficient information to code with codable children: Secondary | ICD-10-CM

## 2018-05-17 DIAGNOSIS — E1139 Type 2 diabetes mellitus with other diabetic ophthalmic complication: Secondary | ICD-10-CM

## 2018-05-17 DIAGNOSIS — E1165 Type 2 diabetes mellitus with hyperglycemia: Secondary | ICD-10-CM

## 2018-05-17 DIAGNOSIS — N521 Erectile dysfunction due to diseases classified elsewhere: Secondary | ICD-10-CM

## 2018-05-17 NOTE — Telephone Encounter (Signed)
Refill request was sent to Dr. Krichna Sowles for approval and submission.  

## 2018-05-18 NOTE — Telephone Encounter (Signed)
Refill request was sent to Dr. Krichna Sowles for approval and submission.  

## 2018-05-24 ENCOUNTER — Encounter: Payer: Self-pay | Admitting: Family Medicine

## 2018-05-24 ENCOUNTER — Ambulatory Visit (INDEPENDENT_AMBULATORY_CARE_PROVIDER_SITE_OTHER): Payer: Medicare Other | Admitting: Family Medicine

## 2018-05-24 ENCOUNTER — Ambulatory Visit: Payer: Medicare Other

## 2018-05-24 VITALS — BP 122/62 | HR 60 | Temp 98.2°F | Resp 12 | Ht 69.0 in | Wt 137.3 lb

## 2018-05-24 DIAGNOSIS — Z0000 Encounter for general adult medical examination without abnormal findings: Secondary | ICD-10-CM

## 2018-05-24 DIAGNOSIS — E559 Vitamin D deficiency, unspecified: Secondary | ICD-10-CM

## 2018-05-24 DIAGNOSIS — E1140 Type 2 diabetes mellitus with diabetic neuropathy, unspecified: Secondary | ICD-10-CM | POA: Diagnosis not present

## 2018-05-24 DIAGNOSIS — E785 Hyperlipidemia, unspecified: Secondary | ICD-10-CM | POA: Diagnosis not present

## 2018-05-24 DIAGNOSIS — I10 Essential (primary) hypertension: Secondary | ICD-10-CM | POA: Diagnosis not present

## 2018-05-24 DIAGNOSIS — K5909 Other constipation: Secondary | ICD-10-CM

## 2018-05-24 DIAGNOSIS — E1165 Type 2 diabetes mellitus with hyperglycemia: Secondary | ICD-10-CM | POA: Diagnosis not present

## 2018-05-24 DIAGNOSIS — N521 Erectile dysfunction due to diseases classified elsewhere: Secondary | ICD-10-CM

## 2018-05-24 DIAGNOSIS — C61 Malignant neoplasm of prostate: Secondary | ICD-10-CM | POA: Diagnosis not present

## 2018-05-24 DIAGNOSIS — D708 Other neutropenia: Secondary | ICD-10-CM | POA: Diagnosis not present

## 2018-05-24 DIAGNOSIS — E538 Deficiency of other specified B group vitamins: Secondary | ICD-10-CM

## 2018-05-24 DIAGNOSIS — E1139 Type 2 diabetes mellitus with other diabetic ophthalmic complication: Secondary | ICD-10-CM | POA: Diagnosis not present

## 2018-05-24 DIAGNOSIS — H42 Glaucoma in diseases classified elsewhere: Secondary | ICD-10-CM | POA: Diagnosis not present

## 2018-05-24 DIAGNOSIS — IMO0002 Reserved for concepts with insufficient information to code with codable children: Secondary | ICD-10-CM

## 2018-05-24 MED ORDER — LUBIPROSTONE 24 MCG PO CAPS
24.0000 ug | ORAL_CAPSULE | Freq: Two times a day (BID) | ORAL | 0 refills | Status: DC
Start: 2018-05-24 — End: 2018-10-19

## 2018-05-24 NOTE — Progress Notes (Signed)
Name: Austin Hunt   MRN: [id number]    DOB: [birth year]   Date:05/24/2018       Progress Note  Subjective  Chief Complaint  Chief Complaint  Patient presents with  . Hyperlipidemia  . Hypertension  . Diabetes    HPI  DMII: glucose at home is only being checked fasting, we stopped Glipizide  because glucose was below 100, but he asked to go back on medication because glucose was spiking - up to 200, he has been back on medication for the past 4 months and he states glucose has been controlled usually 110-115. Yesterday spiked to 163 fasting and that was unusual. He denies polyphagia, polydipsia or polyuria. Vision is better since he had glaucoma and cataract surgery.  He also has ED and sees Urologist and has rx of Viagra at home. Nocturia at most once per night.He has been taking Metformin also. No side effects of medication  Leukopenia: WBC was low , but stable, discussed repeating labs and he is willing to have labs done today   BPH and prostate cancer: he is seeing Urologist and denies any obstructive symptoms, except for nocturia times one at most. His last PSA reviewed was 1.2 that was down from previous level .  Tinea Cruris: he states that itching is still present even with cream.  HTN: patient is taking medication and denies side effects of medication,  bp is at goal, denies chest pain or palpitation.   Dyslipidemia: he is not on statin therapy, he refuses medication - myalgia in the past.  He is taking aspirin   Chronic constipation: taking Amitiza BID and still has constipation, we will adjust dose, symptoms started over 30 years ago, seen by GI before. He feels like symptoms are getting worse. Took laxative over the weekend and had a good bowel movements. No blood in stools.   Patient Active Problem List   Diagnosis Date Noted  . Prostate cancer ([place]) 02/07/2018  . Cervical pain (neck) 10/13/2016  . 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 Long term current use of systemic steroids 01/07/2016  . Neutropenia ([place]) 01/07/2016  . Anxiety 04/08/2015  . 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 Chronic constipation 04/08/2015  . 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 Failure of erection 04/08/2015  . H/O inguinal hernia repair 04/08/2015  . HLD (hyperlipidemia) 04/08/2015  . 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 LBP (low back pain) 04/08/2015  . 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 Drug intolerance 04/08/2015  . Kidney lump 04/08/2015  . TI (tricuspid incompetence) 04/08/2015  . Unilateral recurrent femoral hernia without obstruction or gangrene 05/23/2014  . Femoral hernia 05/23/2014  . Right inguinal hernia 02/06/2014  . Cataract 01/16/2014  . Glaucoma 01/16/2014  . Reflux esophagitis 06/21/2009  . Coronary atherosclerosis 04/03/2009  . Benign enlargement of prostate 02/11/2009  . Decreased libido 01/12/2008  . Benign essential HTN 09/12/2007    Past Surgical History:  Procedure Laterality Date  . APPENDECTOMY  1954  . BLADDER SURGERY  Oct 2015  . CARDIAC CATHETERIZATION  2002   1 stent  . COLONOSCOPY    . EYE SURGERY  2011, 2013   cataracts  . EYE SURGERY Right May 2016   glaucoma  . HERNIA REPAIR Left 1967  . HERNIA REPAIR Right 05-10-14   Right femoral hernia repair Dr Bary Castilla with PerFix plug, no onlay mesh..   . PHOTOCOAGULATION WITH LASER Right 12/30/2015   Procedure: PHOTOCOAGULATION WITH LASER;  Surgeon: Rodena Piety  Thomes Lolling, MD;  Location: [facility];  Service: Ophthalmology;  Laterality: Right;  DIABETIC - oral meds IVA BLOCK  . PROSTATE SURGERY  Oct 2015    Family History  Problem Relation Age of Onset  . Diabetes Sister   . Diabetes Brother   . Diabetes Sister   . Prostate cancer Neg Hx   . Bladder Cancer Neg Hx     Social History   Socioeconomic History  . Marital status: Married    Spouse name: Onalee Hua  . Number of children: 4  . Years of education: some  college  . Highest education level: 12th grade  Occupational History    Employer: RETIRED  Social Needs  . Financial resource strain: Not hard at all  . Food insecurity:    Worry: Never true    Inability: Never true  . Transportation needs:    Medical: No    Non-medical: No  Tobacco Use  . Smoking status: Never Smoker  . Smokeless tobacco: Never Used  . Tobacco comment: smoking cessation materials not required  Substance and Sexual Activity  . Alcohol use: No  . Drug use: No  . Sexual activity: Not Currently  Lifestyle  . Physical activity:    Days per week: 4 days    Minutes per session: 60 min  . Stress: Not at all  Relationships  . Social connections:    Talks on phone: Patient refused    Gets together: Patient refused    Attends religious service: Patient refused    Active member of club or organization: Patient refused    Attends meetings of clubs or organizations: Patient refused    Relationship status: Married  . Intimate partner violence:    Fear of current or ex partner: No    Emotionally abused: No    Physically abused: No    Forced sexual activity: No  Other Topics Concern  . Not on file  Social History Narrative  . Not on file     Current Outpatient Medications:  .  ACCU-CHEK AVIVA PLUS test strip, USE AS DIRECTED., Disp: 100 each, Rfl: 12 .  ACCU-CHEK SOFTCLIX LANCETS lancets, TEST TWICE DAILY, Disp: 100 each, Rfl: 2 .  aspirin 81 MG tablet, Take 81 mg by mouth daily. , Disp: , Rfl:  .  brimonidine-timolol (COMBIGAN) 0.2-0.5 % ophthalmic solution, Place 1 drop into both eyes every 12 (twelve) hours., Disp: , Rfl:  .  diclofenac sodium (VOLTAREN) 1 % GEL, Apply 2 g topically 4 (four) times daily., Disp: 100 g, Rfl: 2 .  dorzolamide (TRUSOPT) 2 % ophthalmic solution, , Disp: , Rfl:  .  Garlic 10 MG CAPS, Take 1 capsule by mouth daily. , Disp: , Rfl:  .  glipiZIDE (GLUCOTROL) 5 MG tablet, Take 0.5-1 tablets (2.5-5 mg total) by mouth daily before  breakfast., Disp: 90 tablet, Rfl: 1 .  ketoconazole (NIZORAL) 2 % cream, Apply 1 application topically daily., Disp: 60 g, Rfl: 2 .  lubiprostone (AMITIZA) 8 MCG capsule, Take 1 capsule (8 mcg total) by mouth 2 (two) times daily with a meal., Disp: 60 capsule, Rfl: 5 .  metFORMIN (GLUCOPHAGE-XR) 500 MG 24 hr tablet, TAKE 2 TABLETS BY MOUTH ONCE EVERY MORNING AND TAKE 1 TABLET BY MOUTHAT BEDTIME, Disp: 90 tablet, Rfl: 0 .  Multiple Vitamin (MULTIVITAMIN) tablet, Take 1 tablet by mouth daily., Disp: , Rfl:  .  OMEGA-3 FATTY ACIDS PO, Take 1 tablet by mouth daily., Disp: , Rfl:  .  ramipril (  ALTACE) 2.5 MG capsule, Take 1 capsule (2.5 mg total) by mouth daily., Disp: 90 capsule, Rfl: 1 .  travoprost, benzalkonium, (TRAVATAN) 0.004 % ophthalmic solution, 1 drop at bedtime., Disp: , Rfl:  .  VIAGRA 100 MG tablet, Take 1 tablet (100 mg total) by mouth daily as needed for erectile dysfunction., Disp: 10 tablet, Rfl: 11 .  vitamin C (ASCORBIC ACID) 500 MG tablet, Take 500 mg by mouth daily., Disp: , Rfl:  .  Zinc Acetate (GALZIN) 50 MG CAPS, Take 1 capsule by mouth 3 (three) times a week., Disp: , Rfl:  .  calcium carbonate (OS-CAL - DOSED IN MG OF ELEMENTAL CALCIUM) 1250 (500 Ca) MG tablet, Take by mouth., Disp: , Rfl:   Current Facility-Administered Medications:  .  diclofenac sodium (VOLTAREN) 1 % transdermal gel 2 g, 2 g, Topical, QID, Gardiner Barefoot, DPM  Allergies  Allergen Reactions  . Colesevelam Other (See Comments)  . Penicillins Itching and Other (See Comments)  . Statins Other (See Comments)    myalgia  . Welchol [Colesevelam Hcl]      ROS  Constitutional: Negative for fever or weight change.  Respiratory: Negative for cough and shortness of breath.   Cardiovascular: Negative for chest pain or palpitations.  Gastrointestinal: Negative for abdominal pain, no bowel changes.  Musculoskeletal: Negative for gait problem or joint swelling.  Skin: Negative for rash.  Neurological:  Negative for dizziness or headache.  No other specific complaints in a complete review of systems (except as listed in HPI above).  Objective  Vitals:   05/24/18 0928  BP: 122/62  Pulse: 60  Resp: 12  Temp: 98.2 F (36.8 C)  TempSrc: Oral  SpO2: 95%  Weight: 137 lb 4.8 oz (62.3 kg)  Height: 5\' 9"  (1.753 m)    Body mass index is 20.28 kg/m.  Physical Exam  Constitutional: Patient appears well-developed and small frame distress.  HEENT: head atraumatic, normocephalic, pupils equal and reactive to light, neck supple, throat within normal limits Cardiovascular: Normal rate, regular rhythm and normal heart sounds.  No murmur heard. No BLE edema. Pulmonary/Chest: Effort normal and breath sounds normal. No respiratory distress. Abdominal: Soft.  There is no tenderness. Skin: no rash inguinal area- advised to continue cream for now Psychiatric: Patient has a normal mood and affect. behavior is normal. Judgment and thought content normal.  Recent Results (from the past 2160 hour(s))  PSA     Status: None   Collection Time: 04/12/18 10:32 AM  Result Value Ref Range   Prostate Specific Ag, Serum 1.2 0.0 - 4.0 ng/mL    Comment: Roche ECLIA methodology. According to the American Urological Association, Serum PSA should decrease and remain at undetectable levels after radical prostatectomy. The AUA defines biochemical recurrence as an initial PSA value 0.2 ng/mL or greater followed by a subsequent confirmatory PSA value 0.2 ng/mL or greater. Values obtained with different assay methods or kits cannot be used interchangeably. Results cannot be interpreted as absolute evidence of the presence or absence of malignant disease.      PHQ2/9: Depression screen [facility] 2/9 05/24/2018 05/27/2017 01/04/2017 08/31/2016 02/28/2016  Decreased Interest 0 0 0 0 0  Down, Depressed, Hopeless 0 0 0 0 0  PHQ - 2 Score 0 0 0 0 0  Altered sleeping 0 - - - -  Tired, decreased energy 0 - - - -  Change in  appetite 0 - - - -  Feeling bad or failure about yourself  0 - - - -  Trouble concentrating 0 - - - -  Moving slowly or fidgety/restless 0 - - - -  Suicidal thoughts 0 - - - -  PHQ-9 Score 0 - - - -  Difficult doing work/chores Not difficult at all - - - -     Fall Risk: Fall Risk  05/24/2018 02/07/2018 10/07/2017 05/27/2017 01/04/2017  Falls in the past year? No No No No Yes  Number falls in past yr: - - - - 1  Injury with Fall? - - - - Yes  Risk for fall due to : Impaired vision;History of fall(s) - - - -  Risk for fall due to: Comment wears eyeglasses; h/o tripping - - - -      Assessment & Plan   1. 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 B12 deficiency  - Vitamin B12  3. Other neutropenia ([place])  Recheck CBC  4. Dyslipidemia  - Lipid panel  5. Essential hypertension  - COMPLETE METABOLIC PANEL WITH GFR - CBC with Differential/Platelet - EKG 12-Lead  6. 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 ratio - Hemoglobin A1c  7. Vitamin D deficiency  - VITAMIN D 25 Hydroxy (Vit-D Deficiency, Fractures)  8. Prostate cancer [facility])  Seeing urologist   9. Chronic constipation  Not responding to 8 mcg we will adjust dose and monitor, avoid otc laxatives - lubiprostone (AMITIZA) 24 MCG capsule; Take 1 capsule (24 mcg total) by mouth 2 (two) times daily with a meal.  Dispense: 180 capsule; Refill: 0

## 2018-05-24 NOTE — Progress Notes (Addendum)
Subjective:   Austin Hunt is a 77 y.o. male who presents for Medicare Annual/Subsequent preventive examination.  Review of Systems:  N/A Cardiac Risk Factors include: advanced age (>50men, >73 women);diabetes mellitus;dyslipidemia;hypertension;male gender;sedentary lifestyle     Objective:    Vitals: BP 122/62 (BP Location: Left Arm, Patient Position: Sitting, Cuff Size: Normal)   Pulse 60   Temp 98.2 F (36.8 C) (Oral)   Resp 12   Ht 5\' 9"  (1.753 m)   Wt 137 lb 4.8 oz (62.3 kg)   SpO2 95%   BMI 20.28 kg/m   Body mass index is 20.28 kg/m.  Advanced Directives 05/24/2018 05/27/2017 01/04/2017 08/31/2016 02/28/2016 01/22/2016 12/30/2015  Does Patient Have a Medical Advance Directive? Yes;No Yes Yes Yes Yes Yes Yes  Type of Advance Directive Living will Living will [place];Living will Living will [place];Living will [place];Living will [place];Living will  Does patient want to make changes to medical advance directive? Yes (MAU/Ambulatory/Procedural Areas - Information given) - - Yes - information given No - Patient declined No - Patient declined -  Copy of [place] in Chart? - - No - copy requested No - copy requested No - copy requested No - copy requested No - copy requested    Tobacco Social History   Tobacco Use  Smoking Status Never Smoker  Smokeless Tobacco Never Used  Tobacco Comment   smoking cessation materials not required     Counseling given: No Comment: smoking cessation materials not required  Clinical Intake:  Pre-visit preparation completed: Yes  Pain : No/denies pain   BMI - recorded: 20.28 Nutritional Status: BMI of 19-24  Normal Nutritional Risks: None  Nutrition Risk Assessment: Has the patient had any N/V/D within the last 2 months?  No Does the patient have any non-healing wounds?  No Has the patient had any unintentional weight loss or  weight gain?  No  Is the patient diabetic?  Yes If diabetic, was a CBG obtained today?  No Did the patient bring in their glucometer from home?  No Comments: Pt monitors CBG's daily. Denies any financial strains with the device or supplies.  Diabetic Exams: Diabetic Eye Exam: Completed 09/09/17.  Diabetic Foot Exam: Completed 03/22/17. To be completed by [name] today.  How often do you need to have someone help you when you read instructions, pamphlets, or other written materials from your doctor or pharmacy?: 1 - Never  Interpreter Needed?: No  Information entered by :: Idell Pickles, LPN  Past Medical History:  Diagnosis Date  . 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 Balanitis   . BPH (benign prostatic hypertrophy) with urinary obstruction   . 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 Diabetes mellitus without complication ([place])   . Glaucoma   . Headache    rare - 1x/mo  . Nocturia   . Prostate cancer [facility])    Past Surgical History:  Procedure Laterality Date  . APPENDECTOMY  1954  . BLADDER SURGERY  Oct 2015  . CARDIAC CATHETERIZATION  2002   1 stent  . COLONOSCOPY    . EYE SURGERY  2011, 2013   cataracts  . EYE SURGERY Right May 2016   glaucoma  . HERNIA REPAIR Left 1967  . HERNIA REPAIR Right 05-10-14   Right femoral hernia repair Dr Bary Castilla with PerFix plug, no onlay mesh..   . PHOTOCOAGULATION WITH LASER Right 12/30/2015   Procedure:  PHOTOCOAGULATION WITH LASER;  Surgeon: Ronnell Freshwater, MD;  Location: [place];  Service: Ophthalmology;  Laterality: Right;  DIABETIC - oral meds IVA BLOCK  . PROSTATE SURGERY  Oct 2015   Family History  Problem Relation Age of Onset  . Diabetes Sister   . Diabetes Brother   . Diabetes Sister   . Prostate cancer Neg Hx   . Bladder Cancer Neg Hx    Social History   Socioeconomic History  . Marital status: Married    Spouse name: Onalee Hua  . Number of children: 4  . Years of education: some collee  . Highest education  level: 12th grade  Occupational History    Employer: RETIRED  Social Needs  . Financial resource strain: Not hard at all  . Food insecurity:    Worry: Never true    Inability: Never true  . Transportation needs:    Medical: No    Non-medical: No  Tobacco Use  . Smoking status: Never Smoker  . Smokeless tobacco: Never Used  . Tobacco comment: smoking cessation materials not required  Substance and Sexual Activity  . Alcohol use: No  . Drug use: No  . Sexual activity: Not Currently  Lifestyle  . Physical activity:    Days per week: 4 days    Minutes per session: 60 min  . Stress: Not at all  Relationships  . Social connections:    Talks on phone: Patient refused    Gets together: Patient refused    Attends religious service: Patient refused    Active member of club or organization: Patient refused    Attends meetings of clubs or organizations: Patient refused    Relationship status: Married  Other Topics Concern  . Not on file  Social History Narrative  . Not on file    Outpatient Encounter Medications as of 05/24/2018  Medication Sig  . ACCU-CHEK AVIVA PLUS test strip USE AS DIRECTED.  Marland Kitchen ACCU-CHEK SOFTCLIX LANCETS lancets TEST TWICE DAILY  . aspirin 81 MG tablet Take 81 mg by mouth daily.   . brimonidine-timolol (COMBIGAN) 0.2-0.5 % ophthalmic solution Place 1 drop into both eyes every 12 (twelve) hours.  . dorzolamide (TRUSOPT) 2 % ophthalmic solution   . Garlic 10 MG CAPS Take 1 capsule by mouth daily.   Marland Kitchen glipiZIDE (GLUCOTROL) 5 MG tablet Take 0.5-1 tablets (2.5-5 mg total) by mouth daily before breakfast.  . ketoconazole (NIZORAL) 2 % cream Apply 1 application topically daily.  Marland Kitchen lubiprostone (AMITIZA) 8 MCG capsule Take 1 capsule (8 mcg total) by mouth 2 (two) times daily with a meal.  . metFORMIN (GLUCOPHAGE-XR) 500 MG 24 hr tablet TAKE 2 TABLETS BY MOUTH ONCE EVERY MORNING AND TAKE 1 TABLET BY MOUTHAT BEDTIME  . Multiple Vitamin (MULTIVITAMIN) tablet Take 1  tablet by mouth daily.  . OMEGA-3 FATTY ACIDS PO Take 1 tablet by mouth daily.  . ramipril (ALTACE) 2.5 MG capsule Take 1 capsule (2.5 mg total) by mouth daily.  . travoprost, benzalkonium, (TRAVATAN) 0.004 % ophthalmic solution 1 drop at bedtime.  Marland Kitchen VIAGRA 100 MG tablet Take 1 tablet (100 mg total) by mouth daily as needed for erectile dysfunction.  . Zinc Acetate (GALZIN) 50 MG CAPS Take 1 capsule by mouth 3 (three) times a week.  . calcium carbonate (OS-CAL - DOSED IN MG OF ELEMENTAL CALCIUM) 1250 (500 Ca) MG tablet Take by mouth.  . diclofenac sodium (VOLTAREN) 1 % GEL Apply 2 g topically 4 (four) times daily. (Patient  not taking: Reported on 05/24/2018)  . vitamin C (ASCORBIC ACID) 500 MG tablet Take 500 mg by mouth daily.   Facility-Administered Encounter Medications as of 05/24/2018  Medication  . diclofenac sodium (VOLTAREN) 1 % transdermal gel 2 g    Activities of Daily Living In your present state of health, do you have any difficulty performing the following activities: 05/24/2018 10/07/2017  Hearing? N N  Comment denies hearing aids -  Vision? N N  Comment wears eyeglasses -  Difficulty concentrating or making decisions? N N  Walking or climbing stairs? N N  Dressing or bathing? N N  Doing errands, shopping? N N  Preparing Food and eating ? N -  Comment partial lower dentures -  Using the Toilet? N -  In the past six months, have you accidently leaked urine? N -  Do you have problems with loss of bowel control? N -  Managing your Medications? N -  Managing your Finances? N -  Housekeeping or managing your Housekeeping? N -  Some recent data might be hidden    Patient Care Team: [name], MD as PCP - General (Family Medicine) Byrnett, Forest Gleason, MD (General Surgery) Yolonda Kida, MD as Consulting Physician (Cardiology) Anabel Bene, MD as Referring Physician (Neurology) Hollice Espy, MD as Consulting Physician (Urology) Leandrew Koyanagi, MD as  Consulting Physician (Ophthalmology) [facility] as Consulting Physician (Podiatry) Gardiner Barefoot, DPM as Consulting Physician (Podiatry)   Assessment:   This is a routine wellness examination for Austin Hunt.  Exercise Activities and Dietary recommendations Current Exercise Habits: Structured exercise class, Type of exercise: strength training/weights;treadmill;walking, Time (Minutes): 60, Frequency (Times/Week): 4, Weekly Exercise (Minutes/Week): 240, Intensity: Mild, Exercise limited by: None identified  Goals    . DIET - INCREASE WATER INTAKE     Recommend to drink at least 6-8 8oz glasses of water per day.    . Exercise     Starting 08/31/16, I will continue exercising 4 times a week for 40 minutes.       Fall Risk Fall Risk  05/24/2018 02/07/2018 10/07/2017 05/27/2017 01/04/2017  Falls in the past year? No No No No Yes  Number falls in past yr: - - - - 1  Injury with Fall? - - - - Yes  Risk for fall due to : Impaired vision;History of fall(s) - - - -  Risk for fall due to: Comment wears eyeglasses; h/o tripping - - - -   FALL RISK PREVENTION PERTAINING TO HOME: Is your home free of loose throw rugs in walkways, pet beds, electrical cords, etc? Yes Is there adequate lighting in your home to reduce risk of falls?  Yes Are there stairs in or around your home WITH handrails? Yes  ASSISTIVE DEVICES UTILIZED TO PREVENT FALLS: Use of a cane, walker or w/c? No Grab bars in the bathroom? No  Shower chair or a place to sit while bathing? Yes An elevated toilet seat or a handicapped toilet? No  Timed Get Up and Go Performed: Yes. Pt ambulated 10 feet within 8 sec. Gait stead-fast and without the use of an assistive device. No intervention required at this time. Fall risk prevention has been discussed.  Community Resource Referral:  Pt declined my offer to send [employer] Referral to Care Guide for installation of grab bars in the shower or an  elevated toilet seat.  Depression Screen PHQ 2/9 Scores 05/24/2018 05/27/2017 01/04/2017 08/31/2016  PHQ - 2 Score 0  0 0 0  PHQ- 9 Score 0 - - -    Cognitive Function     6CIT Screen 05/24/2018 08/31/2016  What Year? 0 points 0 points  What month? 0 points 0 points  What time? 0 points 0 points  Count back from 20 0 points 0 points  Months in reverse 0 points 0 points  Repeat phrase 6 points 0 points  Total Score 6 0    Immunization History  Administered Date(s) Administered  . Influenza Split 07/02/2010  . Influenza, High Dose Seasonal PF 08/14/2015, 07/30/2016, 08/19/2017  . Influenza, Seasonal, Injecte, Preservative Fre 07/08/2011, 07/22/2012  . Influenza,inj,Quad PF,6+ Mos 07/11/2013  . Pneumococcal Conjugate-13 01/22/2014  . Pneumococcal-Unspecified 01/03/2008  . Tdap 11/04/2010  . Zoster 06/14/2008    Qualifies for Shingles Vaccine? Yes. Zostavax completed 06/14/08. Due for Shingrix. Education has been provided regarding the importance of this vaccine. Pt has been advised to call insurance company to determine out of pocket expense. Advised may also receive vaccine at local pharmacy or Health Dept. Verbalized acceptance and understanding.  Screening Tests Health Maintenance  Topic Date Due  . FOOT EXAM  03/22/2018  . INFLUENZA VACCINE  06/02/2018  . HEMOGLOBIN A1C  08/09/2018  . OPHTHALMOLOGY EXAM  09/09/2018  . TETANUS/TDAP  11/04/2020  . PNA vac Low Risk Adult  Completed   Cancer Screenings: Lung: Low Dose CT Chest recommended if Age 4-80 years, 30 pack-year currently smoking OR have quit w/in 15years. Patient does not qualify. Colorectal: No longer required  Additional Screenings: Hepatitis C Screening: Does not qualify     Plan:  I have personally reviewed and addressed the Medicare Annual Wellness questionnaire and have noted the following in the patient's chart:  A. Medical and social history B. Use of alcohol, tobacco or illicit drugs  C. Current  medications and supplements D. Functional ability and status E.  Nutritional status F.  Physical activity G. Advance directives H. List of other physicians I.  Hospitalizations, surgeries, and ER visits in previous 12 months J.  [place] such as hearing and vision if needed, cognitive and depression L. Referrals and appointments  In addition, I have reviewed and discussed with patient certain preventive protocols, quality metrics, and best practice recommendations. A written personalized care plan for preventive services as well as general preventive health recommendations were provided to patient.  See attached scanned questionnaire for additional information.   Signed,  Aleatha Borer, LPN Nurse Health Advisor  I have reviewed this encounter including the documentation in this note and/or discussed this patient with the provider, Aleatha Borer, LPN. I am certifying that I agree with the content of this note as supervising physician.  [name], MD [facility] 05/24/2018, 12:45 PM

## 2018-05-24 NOTE — Patient Instructions (Addendum)
Austin Hunt , Thank you for taking time to come for your Medicare Wellness Visit. I appreciate your ongoing commitment to your health goals. Please review the following plan we discussed and let me know if I can assist you in the future.   Screening recommendations/referrals: Colorectal Screening: No longer required  Vision and Dental Exams: Recommended annual ophthalmology exams for early detection of glaucoma and other disorders of the eye Recommended annual dental exams for proper oral hygiene  Diabetic Exams: Recommended annual diabetic eye exams for early detection of retinopathy Recommended annual diabetic foot exams for early detection of peripheral neuropathy.  Diabetic Eye Exam: Up to date Diabetic Foot Exam: To be completed today  Vaccinations: Influenza vaccine: Up to date Pneumococcal vaccine: Up to date Tdap vaccine: Up to date Shingles vaccine: Please call your insurance company to determine your out of pocket expense for the Shingrix vaccine. You may also receive this vaccine at your local pharmacy or Health Dept.    Advanced directives: Advance directive discussed with you today. I have provided a copy for you to complete at home and have notarized. Once this is complete please bring a copy in to our office so we can scan it into your chart.  Goals: Recommend to drink at least 6-8 8oz glasses of water per day.  Next appointment: Please schedule your Annual Wellness Visit with your Nurse Health Advisor in one year.  Preventive Care 79 Years and Older, Male Preventive care refers to lifestyle choices and visits with your health care provider that can promote health and wellness. What does preventive care include?  A yearly physical exam. This is also called an annual well check.  Dental exams once or twice a year.  Routine eye exams. Ask your health care provider how often you should have your eyes checked.  Personal lifestyle choices, including:  Daily care of your  teeth and gums.  Regular physical activity.  Eating a healthy diet.  Avoiding tobacco and drug use.  Limiting alcohol use.  Practicing safe sex.  Taking low doses of aspirin every day.  Taking vitamin and mineral supplements as recommended by your health care provider. What happens during an annual well check? The services and screenings done by your health care provider during your annual well check will depend on your age, overall health, lifestyle risk factors, and family history of disease. Counseling  Your health care provider may ask you questions about your:  Alcohol use.  Tobacco use.  Drug use.  Emotional well-being.  Home and relationship well-being.  Sexual activity.  Eating habits.  History of falls.  Memory and ability to understand (cognition).  Work and work Statistician. Screening  You may have the following tests or measurements:  Height, weight, and BMI.  Blood pressure.  Lipid and cholesterol levels. These may be checked every 5 years, or more frequently if you are over 22 years old.  Skin check.  Lung cancer screening. You may have this screening every year starting at age 31 if you have a 30-pack-year history of smoking and currently smoke or have quit within the past 15 years.  Fecal occult blood test (FOBT) of the stool. You may have this test every year starting at age 38.  Flexible sigmoidoscopy or colonoscopy. You may have a sigmoidoscopy every 5 years or a colonoscopy every 10 years starting at age 85.  Prostate cancer screening. Recommendations will vary depending on your family history and other risks.  Hepatitis C blood test.  Hepatitis  B blood test.  Sexually transmitted disease (STD) testing.  Diabetes screening. This is done by checking your blood sugar (glucose) after you have not eaten for a while (fasting). You may have this done every 1-3 years.  Abdominal aortic aneurysm (AAA) screening. You may need this if you  are a current or former smoker.  Osteoporosis. You may be screened starting at age 46 if you are at high risk. Talk with your health care provider about your test results, treatment options, and if necessary, the need for more tests. Vaccines  Your health care provider may recommend certain vaccines, such as:  Influenza vaccine. This is recommended every year.  Tetanus, diphtheria, and acellular pertussis (Tdap, Td) vaccine. You may need a Td booster every 79 years.  Zoster vaccine. You may need this after age 79.  Pneumococcal 13-valent conjugate (PCV13) vaccine. One dose is recommended after age 18.  Pneumococcal polysaccharide (PPSV23) vaccine. One dose is recommended after age 36. Talk to your health care provider about which screenings and vaccines you need and how often you need them. This information is not intended to replace advice given to you by your health care provider. Make sure you discuss any questions you have with your health care provider. Document Released: 11/15/2015 Document Revised: 07/08/2016 Document Reviewed: 08/20/2015 Elsevier Interactive Patient Education  2017 [employer] Prevention in the Home Falls can cause injuries. They can happen to people of all ages. There are many things you can do to make your home safe and to help prevent falls. What can I do on the outside of my home?  Regularly fix the edges of walkways and driveways and fix any cracks.  Remove anything that might make you trip as you walk through a door, such as a raised step or threshold.  Trim any bushes or trees on the path to your home.  Use bright outdoor lighting.  Clear any walking paths of anything that might make someone trip, such as rocks or tools.  Regularly check to see if handrails are loose or broken. Make sure that both sides of any steps have handrails.  Any raised decks and porches should have guardrails on the edges.  Have any leaves, snow, or ice cleared  regularly.  Use sand or salt on walking paths during winter.  Clean up any spills in your garage right away. This includes oil or grease spills. What can I do in the bathroom?  Use night lights.  Install grab bars by the toilet and in the tub and shower. Do not use towel bars as grab bars.  Use non-skid mats or decals in the tub or shower.  If you need to sit down in the shower, use a plastic, non-slip stool.  Keep the floor dry. Clean up any water that spills on the floor as soon as it happens.  Remove soap buildup in the tub or shower regularly.  Attach bath mats securely with double-sided non-slip rug tape.  Do not have throw rugs and other things on the floor that can make you trip. What can I do in the bedroom?  Use night lights.  Make sure that you have a light by your bed that is easy to reach.  Do not use any sheets or blankets that are too big for your bed. They should not hang down onto the floor.  Have a firm chair that has side arms. You can use this for support while you get dressed.  Do not have  throw rugs and other things on the floor that can make you trip. What can I do in the kitchen?  Clean up any spills right away.  Avoid walking on wet floors.  Keep items that you use a lot in easy-to-reach places.  If you need to reach something above you, use a strong step stool that has a grab bar.  Keep electrical cords out of the way.  Do not use floor polish or wax that makes floors slippery. If you must use wax, use non-skid floor wax.  Do not have throw rugs and other things on the floor that can make you trip. What can I do with my stairs?  Do not leave any items on the stairs.  Make sure that there are handrails on both sides of the stairs and use them. Fix handrails that are broken or loose. Make sure that handrails are as long as the stairways.  Check any carpeting to make sure that it is firmly attached to the stairs. Fix any carpet that is loose  or worn.  Avoid having throw rugs at the top or bottom of the stairs. If you do have throw rugs, attach them to the floor with carpet tape.  Make sure that you have a light switch at the top of the stairs and the bottom of the stairs. If you do not have them, ask someone to add them for you. What else can I do to help prevent falls?  Wear shoes that:  Do not have high heels.  Have rubber bottoms.  Are comfortable and fit you well.  Are closed at the toe. Do not wear sandals.  If you use a stepladder:  Make sure that it is fully opened. Do not climb a closed stepladder.  Make sure that both sides of the stepladder are locked into place.  Ask someone to hold it for you, if possible.  Clearly mark and make sure that you can see:  Any grab bars or handrails.  First and last steps.  Where the edge of each step is.  Use tools that help you move around (mobility aids) if they are needed. These include:  Canes.  Walkers.  Scooters.  Crutches.  Turn on the lights when you go into a dark area. Replace any light bulbs as soon as they burn out.  Set up your furniture so you have a clear path. Avoid moving your furniture around.  If any of your floors are uneven, fix them.  If there are any pets around you, be aware of where they are.  Review your medicines with your doctor. Some medicines can make you feel dizzy. This can increase your chance of falling. Ask your doctor what other things that you can do to help prevent falls. This information is not intended to replace advice given to you by your health care provider. Make sure you discuss any questions you have with your health care provider. Document Released: 08/15/2009 Document Revised: 03/26/2016 Document Reviewed: 11/23/2014 Elsevier Interactive Patient Education  2017 [employer].

## 2018-05-25 LAB — LIPID PANEL
Cholesterol: 255 mg/dL — ABNORMAL HIGH (ref <200)
HDL: 57 mg/dL (ref >40)
LDL Cholesterol (Calc): 181 mg/dL (calc) — ABNORMAL HIGH
Non-HDL Cholesterol (Calc): 198 mg/dL (calc) — ABNORMAL HIGH (ref <130)
Total CHOL/HDL Ratio: 4.5 (calc) (ref <5.0)
Triglycerides: 69 mg/dL (ref <150)

## 2018-05-25 LAB — MICROALBUMIN / CREATININE URINE RATIO
Creatinine, Urine: 115 mg/dL (ref 20–320)
Microalb Creat Ratio: 7 mcg/mg creat (ref <30)
Microalb, Ur: 0.8 mg/dL

## 2018-05-25 LAB — CBC WITH DIFFERENTIAL/PLATELET
Basophils Absolute: 19 cells/uL (ref 0–200)
Basophils Relative: 0.6 %
Eosinophils Absolute: 152 cells/uL (ref 15–500)
Eosinophils Relative: 4.9 %
HCT: 46.2 % (ref 38.5–50.0)
Hemoglobin: 15.5 g/dL (ref 13.2–17.1)
Lymphs Abs: 1063 cells/uL (ref 850–3900)
MCH: 29 pg (ref 27.0–33.0)
MCHC: 33.5 g/dL (ref 32.0–36.0)
MCV: 86.4 fL (ref 80.0–100.0)
MPV: 9.4 fL (ref 7.5–12.5)
Monocytes Relative: 9.7 %
Neutro Abs: 1566 cells/uL (ref 1500–7800)
Neutrophils Relative %: 50.5 %
Platelets: 231 10*3/uL (ref 140–400)
RBC: 5.35 10*6/uL (ref 4.20–5.80)
RDW: 12.1 % (ref 11.0–15.0)
Total Lymphocyte: 34.3 %
WBC mixed population: 301 cells/uL (ref 200–950)
WBC: 3.1 10*3/uL — ABNORMAL LOW (ref 3.8–10.8)

## 2018-05-25 LAB — COMPLETE METABOLIC PANEL WITH GFR
AG Ratio: 1.7 (calc) (ref 1.0–2.5)
ALT: 12 U/L (ref 9–46)
AST: 21 U/L (ref 10–35)
Albumin: 4.5 g/dL (ref 3.6–5.1)
Alkaline phosphatase (APISO): 72 U/L (ref 40–115)
BUN: 16 mg/dL (ref 7–25)
CO2: 31 mmol/L (ref 20–32)
Calcium: 9.7 mg/dL (ref 8.6–10.3)
Chloride: 102 mmol/L (ref 98–110)
Creat: 0.94 mg/dL (ref 0.70–1.18)
GFR, Est African American: 89 mL/min/{1.73_m2} (ref >=60)
GFR, Est Non African American: 77 mL/min/{1.73_m2} (ref >=60)
Globulin: 2.6 g/dL (calc) (ref 1.9–3.7)
Glucose, Bld: 131 mg/dL — ABNORMAL HIGH (ref 65–99)
Potassium: 4.3 mmol/L (ref 3.5–5.3)
Sodium: 139 mmol/L (ref 135–146)
Total Bilirubin: 0.6 mg/dL (ref 0.2–1.2)
Total Protein: 7.1 g/dL (ref 6.1–8.1)

## 2018-05-25 LAB — VITAMIN D 25 HYDROXY (VIT D DEFICIENCY, FRACTURES): Vit D, 25-Hydroxy: 31 ng/mL (ref 30–100)

## 2018-05-25 LAB — HEMOGLOBIN A1C
Hgb A1c MFr Bld: 7.4 % of total Hgb — ABNORMAL HIGH (ref <5.7)
Mean Plasma Glucose: 166 (calc)
eAG (mmol/L): 9.2 (calc)

## 2018-05-25 LAB — VITAMIN B12: Vitamin B-12: 704 pg/mL (ref 200–1100)

## 2018-05-26 ENCOUNTER — Telehealth: Payer: Self-pay | Admitting: Family Medicine

## 2018-05-26 NOTE — Telephone Encounter (Signed)
-----   Message from Vonna Kotyk, [place] sent at 05/25/2018  2:36 PM EDT ----- Patient called.  Unable to reach patient. If patient calls back please inform her of her most recent lab results.

## 2018-05-26 NOTE — Telephone Encounter (Signed)
Copied from [place] [id number]. Topic: Quick Communication - Lab Results >> May 25, 2018  2:37 PM Vonna Kotyk, CMA wrote: Called patient to inform them of most lab results. When patient returns call, triage nurse may disclose results.

## 2018-05-26 NOTE — Telephone Encounter (Signed)
Attempted to call patient to disclose lab results. No answer. See CRM [id number], and result note.

## 2018-05-26 NOTE — Telephone Encounter (Signed)
Copied from [place] [id number]. Topic: General - Other >> May 26, 2018  2:32 PM Yvette Rack wrote: Reason for CRM: pt calling back about lab results

## 2018-05-26 NOTE — Telephone Encounter (Signed)
See TE.  Patient did not answer.  Result note still in results folder

## 2018-05-27 ENCOUNTER — Other Ambulatory Visit: Payer: Self-pay | Admitting: Family Medicine

## 2018-05-27 MED ORDER — EZETIMIBE 10 MG PO TABS: 10.0000 mg | ORAL_TABLET | Freq: Every day | ORAL | 3 refills | Status: DC

## 2018-05-27 NOTE — Telephone Encounter (Signed)
See result note.  

## 2018-06-09 ENCOUNTER — Ambulatory Visit: Payer: Medicare Other | Admitting: Family Medicine

## 2018-07-05 ENCOUNTER — Telehealth: Payer: Self-pay

## 2018-07-05 NOTE — Telephone Encounter (Signed)
He should get high dose flu in our office ( we have in stock now)  He can call insurance and find out if he can get Shingrix in our office or does he need to get it at local pharmacy?

## 2018-07-05 NOTE — Telephone Encounter (Signed)
Copied from [place] [phone]. Topic: Inquiry >> Jul 01, 2018  4:41 PM Berneta Levins wrote: Reason for CRM:   Pt thinks he remembers [name] telling him to go to pharmacy for a shot, but can't remember what type. Pt can be reached at [phone]  Please advise which injection?

## 2018-07-06 NOTE — Telephone Encounter (Signed)
I tried to contact this patient to inform him of [name] message but there was no answer. A message was left for him with that information on his voicemail and he was asked to give [place] a call if he had any additional questions.

## 2018-07-15 ENCOUNTER — Telehealth: Payer: Self-pay

## 2018-07-15 NOTE — Telephone Encounter (Signed)
Ask if he would like to see dermatologist and who

## 2018-07-15 NOTE — Telephone Encounter (Signed)
Patient called, left VM on home and cell phone listed to return call to the office.

## 2018-07-15 NOTE — Telephone Encounter (Signed)
Copied from [place] [phone]. Topic: General - Other >> Jul 14, 2018  2:59 PM Mcneil, Ja-Kwan wrote: Reason for CRM: Pt states the Rx for ketoconazole (NIZORAL) 2 % cream is not working. Pt requests a call back. Cb# [phone]  Patient stated that he would like to go to same place as his son but could not think of it. He will talk with his son and will give [place] a call back.

## 2018-07-18 ENCOUNTER — Other Ambulatory Visit: Payer: Self-pay | Admitting: Family Medicine

## 2018-07-18 DIAGNOSIS — N521 Erectile dysfunction due to diseases classified elsewhere: Secondary | ICD-10-CM

## 2018-07-18 DIAGNOSIS — E1165 Type 2 diabetes mellitus with hyperglycemia: Secondary | ICD-10-CM

## 2018-07-18 DIAGNOSIS — H42 Glaucoma in diseases classified elsewhere: Secondary | ICD-10-CM

## 2018-07-18 DIAGNOSIS — E1140 Type 2 diabetes mellitus with diabetic neuropathy, unspecified: Principal | ICD-10-CM

## 2018-07-18 DIAGNOSIS — E1139 Type 2 diabetes mellitus with other diabetic ophthalmic complication: Secondary | ICD-10-CM

## 2018-07-18 DIAGNOSIS — IMO0002 Reserved for concepts with insufficient information to code with codable children: Secondary | ICD-10-CM

## 2018-07-19 ENCOUNTER — Telehealth: Payer: Self-pay | Admitting: Family Medicine

## 2018-07-19 DIAGNOSIS — L304 Erythema intertrigo: Secondary | ICD-10-CM

## 2018-07-19 NOTE — Telephone Encounter (Signed)
Okay, diagnosis is intertrigo

## 2018-07-19 NOTE — Telephone Encounter (Signed)
Copied from [place] [id number]. Topic: General - Other >> Jul 19, 2018 12:03 PM Janace Aris A wrote: Reason for CRM: Patient called in requesting a referral to see [facility] in [facility] ([place]). Says he was able to already schedule an Appointment for 08/05/2018 @ 10:40am  Fax # [phone] Phone # [phone]

## 2018-08-01 ENCOUNTER — Ambulatory Visit (INDEPENDENT_AMBULATORY_CARE_PROVIDER_SITE_OTHER): Payer: Medicare Other | Admitting: Podiatry

## 2018-08-01 ENCOUNTER — Encounter: Payer: Self-pay | Admitting: Podiatry

## 2018-08-01 DIAGNOSIS — M79676 Pain in unspecified toe(s): Secondary | ICD-10-CM

## 2018-08-01 DIAGNOSIS — E119 Type 2 diabetes mellitus without complications: Secondary | ICD-10-CM

## 2018-08-01 DIAGNOSIS — B351 Tinea unguium: Secondary | ICD-10-CM | POA: Diagnosis not present

## 2018-08-01 DIAGNOSIS — M2010 Hallux valgus (acquired), unspecified foot: Secondary | ICD-10-CM

## 2018-08-01 NOTE — Progress Notes (Signed)
Complaint:  Visit Type: Patient returns to my office for continued preventative foot care services. Complaint: Patient states" my nails have grown long and thick and become painful to walk and wear shoes" Patient has been diagnosed with DM with neuropathy. The patient presents for preventative foot care services. No changes to ROS  Podiatric Exam: Vascular: dorsalis pedis and posterior tibial pulses are palpable bilateral. Capillary return is immediate. Temperature gradient is WNL. Skin turgor WNL  Sensorium: Normal Semmes Weinstein monofilament test. Normal tactile sensation bilaterally. Nail Exam: Pt has thick disfigured discolored nails with subungual debris noted bilateral entire nail hallux through fifth toenails Ulcer Exam: There is no evidence of ulcer or pre-ulcerative changes or infection. Orthopedic Exam: Muscle tone and strength are WNL. No limitations in general ROM. No crepitus or effusions noted. Foot type and digits show no abnormalities.  HAV  B/L. Bony prominence at fifth metabase. Skin: No Porokeratosis. No infection or ulcers  Diagnosis:  Onychomycosis, , Pain in right toe, pain in left toes,  Diabetes with neuropathy  HAV  B/L  Treatment & Plan Procedures and Treatment: Consent by patient was obtained for treatment procedures. The patient understood the discussion of treatment and procedures well. All questions were answered thoroughly reviewed. Debridement of mycotic and hypertrophic toenails, 1 through 5 bilateral and clearing of subungual debris. No ulceration, no infection noted.     ABN signed for 2019. Return Visit-Office Procedure: Patient instructed to return to the office for a follow up visit 3 months for continued evaluation and treatment.    Gardiner Barefoot DPM

## 2018-08-05 DIAGNOSIS — B356 Tinea cruris: Secondary | ICD-10-CM | POA: Diagnosis not present

## 2018-08-09 ENCOUNTER — Ambulatory Visit (INDEPENDENT_AMBULATORY_CARE_PROVIDER_SITE_OTHER): Payer: Medicare Other

## 2018-08-09 DIAGNOSIS — Z23 Encounter for immunization: Secondary | ICD-10-CM

## 2018-08-18 DIAGNOSIS — J340 Abscess, furuncle and carbuncle of nose: Secondary | ICD-10-CM | POA: Diagnosis not present

## 2018-08-18 DIAGNOSIS — R221 Localized swelling, mass and lump, neck: Secondary | ICD-10-CM | POA: Diagnosis not present

## 2018-08-22 ENCOUNTER — Other Ambulatory Visit: Payer: Self-pay | Admitting: Family Medicine

## 2018-08-22 DIAGNOSIS — E1139 Type 2 diabetes mellitus with other diabetic ophthalmic complication: Secondary | ICD-10-CM

## 2018-08-22 DIAGNOSIS — N521 Erectile dysfunction due to diseases classified elsewhere: Secondary | ICD-10-CM

## 2018-08-22 DIAGNOSIS — E1165 Type 2 diabetes mellitus with hyperglycemia: Secondary | ICD-10-CM

## 2018-08-22 DIAGNOSIS — IMO0002 Reserved for concepts with insufficient information to code with codable children: Secondary | ICD-10-CM

## 2018-08-22 DIAGNOSIS — H42 Glaucoma in diseases classified elsewhere: Secondary | ICD-10-CM

## 2018-08-22 DIAGNOSIS — E1140 Type 2 diabetes mellitus with diabetic neuropathy, unspecified: Principal | ICD-10-CM

## 2018-08-23 DIAGNOSIS — E7800 Pure hypercholesterolemia, unspecified: Secondary | ICD-10-CM | POA: Diagnosis not present

## 2018-08-23 DIAGNOSIS — Z7952 Long term (current) use of systemic steroids: Secondary | ICD-10-CM | POA: Diagnosis not present

## 2018-08-23 DIAGNOSIS — I10 Essential (primary) hypertension: Secondary | ICD-10-CM | POA: Diagnosis not present

## 2018-08-23 DIAGNOSIS — M353 Polymyalgia rheumatica: Secondary | ICD-10-CM | POA: Diagnosis not present

## 2018-08-23 DIAGNOSIS — I25118 Atherosclerotic heart disease of native coronary artery with other forms of angina pectoris: Secondary | ICD-10-CM | POA: Diagnosis not present

## 2018-08-23 DIAGNOSIS — K4191 Unilateral femoral hernia, without obstruction or gangrene, recurrent: Secondary | ICD-10-CM | POA: Diagnosis not present

## 2018-09-02 ENCOUNTER — Other Ambulatory Visit: Payer: Self-pay | Admitting: Family Medicine

## 2018-09-02 DIAGNOSIS — E1139 Type 2 diabetes mellitus with other diabetic ophthalmic complication: Principal | ICD-10-CM

## 2018-09-02 DIAGNOSIS — E1140 Type 2 diabetes mellitus with diabetic neuropathy, unspecified: Secondary | ICD-10-CM

## 2018-09-02 DIAGNOSIS — H42 Glaucoma in diseases classified elsewhere: Principal | ICD-10-CM

## 2018-09-02 DIAGNOSIS — N521 Erectile dysfunction due to diseases classified elsewhere: Secondary | ICD-10-CM

## 2018-09-02 DIAGNOSIS — E1136 Type 2 diabetes mellitus with diabetic cataract: Secondary | ICD-10-CM

## 2018-09-02 NOTE — Telephone Encounter (Signed)
Hypertension medication request: Glipizide to [name].   Last office visit pertaining to hypertension: 05/24/2018   BP Readings from Last 3 Encounters:  05/24/18 122/62  05/24/18 122/62  04/15/18 (!) 144/74    Lab Results  Component Value Date   CREATININE 0.94 05/24/2018   BUN 16 05/24/2018   NA 139 05/24/2018   K 4.3 05/24/2018   CL 102 05/24/2018   CO2 31 05/24/2018     Follow up on 09/26/2018

## 2018-09-06 DIAGNOSIS — L280 Lichen simplex chronicus: Secondary | ICD-10-CM | POA: Diagnosis not present

## 2018-09-06 DIAGNOSIS — B356 Tinea cruris: Secondary | ICD-10-CM | POA: Diagnosis not present

## 2018-09-06 DIAGNOSIS — H401133 Primary open-angle glaucoma, bilateral, severe stage: Secondary | ICD-10-CM | POA: Diagnosis not present

## 2018-09-06 LAB — HM DIABETES EYE EXAM

## 2018-09-09 ENCOUNTER — Encounter: Payer: Self-pay | Admitting: Family Medicine

## 2018-09-26 ENCOUNTER — Ambulatory Visit: Payer: Medicare Other | Admitting: Family Medicine

## 2018-10-19 ENCOUNTER — Ambulatory Visit (INDEPENDENT_AMBULATORY_CARE_PROVIDER_SITE_OTHER): Payer: Medicare Other | Admitting: Family Medicine

## 2018-10-19 ENCOUNTER — Encounter: Payer: Self-pay | Admitting: Family Medicine

## 2018-10-19 VITALS — BP 138/70 | HR 65 | Temp 97.4°F | Resp 16 | Ht 69.0 in | Wt 141.3 lb

## 2018-10-19 DIAGNOSIS — E785 Hyperlipidemia, unspecified: Secondary | ICD-10-CM

## 2018-10-19 DIAGNOSIS — N521 Erectile dysfunction due to diseases classified elsewhere: Secondary | ICD-10-CM | POA: Diagnosis not present

## 2018-10-19 DIAGNOSIS — I10 Essential (primary) hypertension: Secondary | ICD-10-CM

## 2018-10-19 DIAGNOSIS — D708 Other neutropenia: Secondary | ICD-10-CM

## 2018-10-19 DIAGNOSIS — K5909 Other constipation: Secondary | ICD-10-CM | POA: Diagnosis not present

## 2018-10-19 DIAGNOSIS — E1140 Type 2 diabetes mellitus with diabetic neuropathy, unspecified: Secondary | ICD-10-CM | POA: Diagnosis not present

## 2018-10-19 DIAGNOSIS — C61 Malignant neoplasm of prostate: Secondary | ICD-10-CM | POA: Diagnosis not present

## 2018-10-19 LAB — POCT GLYCOSYLATED HEMOGLOBIN (HGB A1C)
HbA1c POC (<> result, manual entry): 7.2 % (ref 4.0–5.6)
HbA1c, POC (controlled diabetic range): 7.2 % — AB (ref 0.0–7.0)
HbA1c, POC (prediabetic range): 7.2 % — AB (ref 5.7–6.4)
Hemoglobin A1C: 7.2 % — AB (ref 4.0–5.6)

## 2018-10-19 MED ORDER — LUBIPROSTONE 24 MCG PO CAPS: 24.0000 ug | ORAL_CAPSULE | Freq: Two times a day (BID) | ORAL | 0 refills | Status: DC

## 2018-10-19 NOTE — Progress Notes (Signed)
Name: Austin Hunt   MRN: [id number]    DOB: 1940-12-13   Date:10/19/2018       Progress Note  Subjective  Chief Complaint  Chief Complaint  Patient presents with  . Diabetes  . Hypertension  . Hyperlipidemia    HPI  DMII: glucose at home is around 100-130, he states he skips meals at times and glucose drops, he gets hot and sweaty, he states hypoglycemic episodes are happening about once a week. HbgA1C is down to 7.2%  He denies polyphagia, polydipsia or polyuria. Vision is better since he had glaucoma and cataract surgery.  He also has ED and sees Urologist and has rx of Viagra at home.Nocturia at most once per night.He has been taking Metformin also. Go down to Glipizide half daily and skip medication if he is not going to eat breakfast and lunch   Leukopenia: WBC was low , down to 3.1 and we will recheck it today   BPH and prostate cancer: he is seeing Urologist and denies any obstructive symptoms, except for nocturia times one at most.he is on yearly follow up now.   HTN: patient is taking medication and denies side effects of medication,  bp was elevated when he got here but improved after rest. No chest pain or palpitation.   Dyslipidemia: he is not on statin therapy, he refuses medication - myalgia in the past, he is taking Zetia now and we will recheck labs today . He is taking aspirin   Chronic constipation: taking Amitiza prn, he asked me about bowel cleanse and explained no indication for that   Patient Active Problem List   Diagnosis Date Noted  . Prostate cancer ([place]) 02/07/2018  . Cervical pain (neck) 10/13/2016  . 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 Long term current use of systemic steroids 01/07/2016  . Neutropenia ([place]) 01/07/2016  . Anxiety 04/08/2015  . 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 Chronic constipation 04/08/2015  . 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 Failure of erection 04/08/2015  . H/O  inguinal hernia repair 04/08/2015  . HLD (hyperlipidemia) 04/08/2015  . 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 LBP (low back pain) 04/08/2015  . 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 Drug intolerance 04/08/2015  . Kidney lump 04/08/2015  . TI (tricuspid incompetence) 04/08/2015  . Unilateral recurrent femoral hernia without obstruction or gangrene 05/23/2014  . Femoral hernia 05/23/2014  . Right inguinal hernia 02/06/2014  . Cataract 01/16/2014  . Glaucoma 01/16/2014  . Reflux esophagitis 06/21/2009  . Coronary atherosclerosis 04/03/2009  . Benign enlargement of prostate 02/11/2009  . Decreased libido 01/12/2008  . Benign essential HTN 09/12/2007    Past Surgical History:  Procedure Laterality Date  . APPENDECTOMY  1954  . BLADDER SURGERY  Oct 2015  . CARDIAC CATHETERIZATION  2002   1 stent  . COLONOSCOPY    . EYE SURGERY  2011, 2013   cataracts  . EYE SURGERY Right May 2016   glaucoma  . HERNIA REPAIR Left 1967  . HERNIA REPAIR Right 05-10-14   Right femoral hernia repair Dr Bary Castilla with PerFix plug, no onlay mesh..   . PHOTOCOAGULATION WITH LASER Right 12/30/2015   Procedure: PHOTOCOAGULATION WITH LASER;  Surgeon: Ronnell Freshwater, MD;  Location: [facility];  Service: Ophthalmology;  Laterality: Right;  DIABETIC - oral meds IVA BLOCK  . PROSTATE SURGERY  Oct 2015    Family History  Problem Relation Age of Onset  . Diabetes Sister   .  Diabetes Brother   . Diabetes Sister   . Prostate cancer Neg Hx   . Bladder Cancer Neg Hx     Social History   Socioeconomic History  . Marital status: Married    Spouse name: Onalee Hua  . Number of children: 4  . Years of education: some college  . Highest education level: 12th grade  Occupational History    Employer: RETIRED  Social Needs  . Financial resource strain: Not hard at all  . Food insecurity:    Worry: Never true    Inability: Never true  . Transportation needs:    Medical: No    Non-medical: No   Tobacco Use  . Smoking status: Never Smoker  . Smokeless tobacco: Never Used  . Tobacco comment: smoking cessation materials not required  Substance and Sexual Activity  . Alcohol use: No  . Drug use: No  . Sexual activity: Not Currently  Lifestyle  . Physical activity:    Days per week: 4 days    Minutes per session: 60 min  . Stress: Not at all  Relationships  . Social connections:    Talks on phone: Patient refused    Gets together: Patient refused    Attends religious service: Patient refused    Active member of club or organization: Patient refused    Attends meetings of clubs or organizations: Patient refused    Relationship status: Married  . Intimate partner violence:    Fear of current or ex partner: No    Emotionally abused: No    Physically abused: No    Forced sexual activity: No  Other Topics Concern  . Not on file  Social History Narrative  . Not on file     Current Outpatient Medications:  .  ACCU-CHEK AVIVA PLUS test strip, USE AS DIRECTED., Disp: 100 each, Rfl: 12 .  ACCU-CHEK SOFTCLIX LANCETS lancets, TEST TWICE DAILY, Disp: 100 each, Rfl: 2 .  aspirin 81 MG tablet, Take 81 mg by mouth daily. , Disp: , Rfl:  .  brimonidine-timolol (COMBIGAN) 0.2-0.5 % ophthalmic solution, Place 1 drop into both eyes every 12 (twelve) hours., Disp: , Rfl:  .  diclofenac sodium (VOLTAREN) 1 % GEL, Apply 2 g topically 4 (four) times daily., Disp: 100 g, Rfl: 2 .  dorzolamide (TRUSOPT) 2 % ophthalmic solution, , Disp: , Rfl:  .  ezetimibe (ZETIA) 10 MG tablet, Take 1 tablet (10 mg total) by mouth daily., Disp: 90 tablet, Rfl: 3 .  Garlic 10 MG CAPS, Take 1 capsule by mouth daily. , Disp: , Rfl:  .  glipiZIDE (GLUCOTROL) 5 MG tablet, TAKE 1/2 TO 1 TABLET BY MOUTH ONCE DAILYBEFORE BREAKFAST, Disp: 90 tablet, Rfl: 0 .  lubiprostone (AMITIZA) 24 MCG capsule, Take 1 capsule (24 mcg total) by mouth 2 (two) times daily with a meal., Disp: 180 capsule, Rfl: 0 .  metFORMIN  (GLUCOPHAGE-XR) 500 MG 24 hr tablet, TAKE 2 TABLETS BY MOUTH ONCE EVERY MORNING AND 1 TABLET AT BEDTIME, Disp: 90 tablet, Rfl: 1 .  Multiple Vitamin (MULTIVITAMIN) tablet, Take 1 tablet by mouth daily., Disp: , Rfl:  .  OMEGA-3 FATTY ACIDS PO, Take 1 tablet by mouth daily., Disp: , Rfl:  .  ramipril (ALTACE) 2.5 MG capsule, Take 1 capsule (2.5 mg total) by mouth daily., Disp: 90 capsule, Rfl: 1 .  travoprost, benzalkonium, (TRAVATAN) 0.004 % ophthalmic solution, 1 drop at bedtime., Disp: , Rfl:  .  VIAGRA 100 MG tablet, Take 1 tablet (100  mg total) by mouth daily as needed for erectile dysfunction., Disp: 10 tablet, Rfl: 11 .  vitamin C (ASCORBIC ACID) 500 MG tablet, Take 500 mg by mouth daily., Disp: , Rfl:  .  Zinc Acetate (GALZIN) 50 MG CAPS, Take 1 capsule by mouth 3 (three) times a week., Disp: , Rfl:  .  calcium carbonate (OS-CAL - DOSED IN MG OF ELEMENTAL CALCIUM) 1250 (500 Ca) MG tablet, Take by mouth., Disp: , Rfl:  .  ketoconazole (NIZORAL) 2 % cream, Apply 1 application topically daily. (Patient not taking: Reported on 10/19/2018), Disp: 60 g, Rfl: 2  Current Facility-Administered Medications:  .  diclofenac sodium (VOLTAREN) 1 % transdermal gel 2 g, 2 g, Topical, QID, Gardiner Barefoot, DPM  Allergies  Allergen Reactions  . Colesevelam Other (See Comments)  . Penicillins Itching and Other (See Comments)  . Statins Other (See Comments)    myalgia  . Welchol [Colesevelam Hcl]     I personally reviewed active problem list, medication list, allergies, family history, social history with the patient/caregiver today.   ROS  Constitutional: Negative for fever or weight change.  Respiratory: Negative for cough and shortness of breath.   Cardiovascular: Negative for chest pain or palpitations.  Gastrointestinal: Negative for abdominal pain, no bowel changes.  Musculoskeletal: Negative for gait problem or joint swelling.  Skin: Negative for rash.  Neurological: Negative for dizziness  or headache.  No other specific complaints in a complete review of systems (except as listed in HPI above).  Objective  Vitals:   10/19/18 1357 10/19/18 1409  BP: (!) 150/70 138/70  Pulse: 65   Resp: 16   Temp: (!) 97.4 F (36.3 C)   TempSrc: Oral   SpO2: 98%   Weight: 141 lb 4.8 oz (64.1 kg)   Height: 5\' 9"  (1.753 m)     Body mass index is 20.87 kg/m.  Physical Exam  Constitutional: Patient appears well-developed and well-nourished. No distress.  HEENT: head atraumatic, normocephalic, pupils equal and reactive to light,  neck supple, throat within normal limits Cardiovascular: Normal rate, regular rhythm and normal heart sounds.  No murmur heard. No BLE edema. Pulmonary/Chest: Effort normal and breath sounds normal. No respiratory distress. Abdominal: Soft.  There is no tenderness. Psychiatric: Patient has a normal mood and affect. behavior is normal. Judgment and thought content normal.  Recent Results (from the past 2160 hour(s))  HM DIABETES EYE EXAM     Status: None   Collection Time: 09/06/18 12:00 AM  Result Value Ref Range   HM Diabetic Eye Exam No Retinopathy No Retinopathy    Comment: [facility]- Dr. Wallace Going. rtn in 4 mths glaucoma f/u  POCT HgB A1C     Status: Abnormal   Collection Time: 10/19/18  2:11 PM  Result Value Ref Range   Hemoglobin A1C 7.2 (A) 4.0 - 5.6 %   HbA1c POC (<> result, manual entry) 7.2 4.0 - 5.6 %   HbA1c, POC (prediabetic range) 7.2 (A) 5.7 - 6.4 %   HbA1c, POC (controlled diabetic range) 7.2 (A) 0.0 - 7.0 %     PHQ2/9: Depression screen [facility] 2/9 05/24/2018 05/27/2017 01/04/2017 08/31/2016 02/28/2016  Decreased Interest 0 0 0 0 0  Down, Depressed, Hopeless 0 0 0 0 0  PHQ - 2 Score 0 0 0 0 0  Altered sleeping 0 - - - -  Tired, decreased energy 0 - - - -  Change in appetite 0 - - - -  Feeling bad or failure about  yourself  0 - - - -  Trouble concentrating 0 - - - -  Moving slowly or fidgety/restless 0 - - - -  Suicidal thoughts 0  - - - -  PHQ-9 Score 0 - - - -  Difficult doing work/chores Not difficult at all - - - -    Fall Risk: Fall Risk  10/19/2018 05/24/2018 02/07/2018 10/07/2017 05/27/2017  Falls in the past year? 0 No No No No  Number falls in past yr: 0 - - - -  Injury with Fall? 0 - - - -  Risk for fall due to : - Impaired vision;History of fall(s) - - -  Risk for fall due to: Comment - wears eyeglasses; h/o tripping - - -     Assessment & Plan  1. 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 Chronic constipation  - lubiprostone (AMITIZA) 24 MCG capsule; Take 1 capsule (24 mcg total) by mouth 2 (two) times daily with a meal.  Dispense: 180 capsule; Refill: 0  3. Other neutropenia ([place])  Recheck today -CBC  4. Dyslipidemia  - Lipid panel  5. Essential hypertension  - COMPLETE METABOLIC PANEL WITH GFR  6. Prostate cancer ([place])  Keep follow up with Urologist

## 2018-10-20 LAB — CBC WITH DIFFERENTIAL/PLATELET
Absolute Monocytes: 430 cells/uL (ref 200–950)
Basophils Absolute: 30 cells/uL (ref 0–200)
Basophils Relative: 0.7 %
Eosinophils Absolute: 181 cells/uL (ref 15–500)
Eosinophils Relative: 4.2 %
HCT: 45.5 % (ref 38.5–50.0)
Hemoglobin: 15.6 g/dL (ref 13.2–17.1)
Lymphs Abs: 1552 cells/uL (ref 850–3900)
MCH: 29.8 pg (ref 27.0–33.0)
MCHC: 34.3 g/dL (ref 32.0–36.0)
MCV: 86.8 fL (ref 80.0–100.0)
MPV: 9.9 fL (ref 7.5–12.5)
Monocytes Relative: 10 %
Neutro Abs: 2107 cells/uL (ref 1500–7800)
Neutrophils Relative %: 49 %
Platelets: 249 10*3/uL (ref 140–400)
RBC: 5.24 10*6/uL (ref 4.20–5.80)
RDW: 12.4 % (ref 11.0–15.0)
Total Lymphocyte: 36.1 %
WBC: 4.3 10*3/uL (ref 3.8–10.8)

## 2018-10-20 LAB — COMPLETE METABOLIC PANEL WITH GFR
AG Ratio: 1.6 (calc) (ref 1.0–2.5)
ALT: 13 U/L (ref 9–46)
AST: 22 U/L (ref 10–35)
Albumin: 4.5 g/dL (ref 3.6–5.1)
Alkaline phosphatase (APISO): 71 U/L (ref 40–115)
BUN: 19 mg/dL (ref 7–25)
CO2: 28 mmol/L (ref 20–32)
Calcium: 9.9 mg/dL (ref 8.6–10.3)
Chloride: 101 mmol/L (ref 98–110)
Creat: 0.88 mg/dL (ref 0.70–1.18)
GFR, Est African American: 95 mL/min/{1.73_m2} (ref >=60)
GFR, Est Non African American: 82 mL/min/{1.73_m2} (ref >=60)
Globulin: 2.9 g/dL (calc) (ref 1.9–3.7)
Glucose, Bld: 97 mg/dL (ref 65–99)
Potassium: 5 mmol/L (ref 3.5–5.3)
Sodium: 139 mmol/L (ref 135–146)
Total Bilirubin: 0.4 mg/dL (ref 0.2–1.2)
Total Protein: 7.4 g/dL (ref 6.1–8.1)

## 2018-10-20 LAB — LIPID PANEL
Cholesterol: 204 mg/dL — ABNORMAL HIGH (ref <200)
HDL: 51 mg/dL (ref >40)
LDL Cholesterol (Calc): 120 mg/dL (calc) — ABNORMAL HIGH
Non-HDL Cholesterol (Calc): 153 mg/dL (calc) — ABNORMAL HIGH (ref <130)
Total CHOL/HDL Ratio: 4 (calc) (ref <5.0)
Triglycerides: 209 mg/dL — ABNORMAL HIGH (ref <150)

## 2018-10-31 ENCOUNTER — Ambulatory Visit: Payer: Medicare Other | Admitting: Podiatry

## 2018-10-31 DIAGNOSIS — I10 Essential (primary) hypertension: Secondary | ICD-10-CM | POA: Diagnosis not present

## 2018-10-31 DIAGNOSIS — M353 Polymyalgia rheumatica: Secondary | ICD-10-CM | POA: Diagnosis not present

## 2018-10-31 DIAGNOSIS — Z955 Presence of coronary angioplasty implant and graft: Secondary | ICD-10-CM | POA: Diagnosis not present

## 2018-10-31 DIAGNOSIS — I208 Other forms of angina pectoris: Secondary | ICD-10-CM | POA: Diagnosis not present

## 2018-10-31 DIAGNOSIS — R011 Cardiac murmur, unspecified: Secondary | ICD-10-CM | POA: Diagnosis not present

## 2018-10-31 DIAGNOSIS — R0602 Shortness of breath: Secondary | ICD-10-CM | POA: Diagnosis not present

## 2018-10-31 DIAGNOSIS — Z7952 Long term (current) use of systemic steroids: Secondary | ICD-10-CM | POA: Diagnosis not present

## 2018-10-31 DIAGNOSIS — E7800 Pure hypercholesterolemia, unspecified: Secondary | ICD-10-CM | POA: Diagnosis not present

## 2018-10-31 DIAGNOSIS — I25118 Atherosclerotic heart disease of native coronary artery with other forms of angina pectoris: Secondary | ICD-10-CM | POA: Diagnosis not present

## 2018-11-03 ENCOUNTER — Ambulatory Visit (INDEPENDENT_AMBULATORY_CARE_PROVIDER_SITE_OTHER): Payer: Medicare Other | Admitting: Podiatry

## 2018-11-03 ENCOUNTER — Encounter: Payer: Self-pay | Admitting: Podiatry

## 2018-11-03 DIAGNOSIS — B351 Tinea unguium: Secondary | ICD-10-CM

## 2018-11-03 DIAGNOSIS — M79676 Pain in unspecified toe(s): Secondary | ICD-10-CM

## 2018-11-03 DIAGNOSIS — E119 Type 2 diabetes mellitus without complications: Secondary | ICD-10-CM

## 2018-11-03 NOTE — Progress Notes (Signed)
Complaint:  Visit Type: Patient returns to my office for continued preventative foot care services. Complaint: Patient states" my nails have grown long and thick and become painful to walk and wear shoes" Patient has been diagnosed with DM with neuropathy. The patient presents for preventative foot care services. No changes to ROS  Podiatric Exam: Vascular: dorsalis pedis and posterior tibial pulses are palpable bilateral. Capillary return is immediate. Temperature gradient is WNL. Skin turgor WNL  Sensorium: Normal Semmes Weinstein monofilament test. Normal tactile sensation bilaterally. Nail Exam: Pt has thick disfigured discolored nails with subungual debris noted bilateral entire nail hallux through fifth toenails Ulcer Exam: There is no evidence of ulcer or pre-ulcerative changes or infection. Orthopedic Exam: Muscle tone and strength are WNL. No limitations in general ROM. No crepitus or effusions noted. Foot type and digits show no abnormalities.  HAV  B/L. Bony prominence at fifth metabase. Skin: No Porokeratosis. No infection or ulcers  Diagnosis:  Onychomycosis, , Pain in right toe, pain in left toes,  Diabetes with neuropathy  HAV  B/L  Treatment & Plan Procedures and Treatment: Consent by patient was obtained for treatment procedures. The patient understood the discussion of treatment and procedures well. All questions were answered thoroughly reviewed. Debridement of mycotic and hypertrophic toenails, 1 through 5 bilateral and clearing of subungual debris. No ulceration, no infection noted.     ABN signed for 2019.Patient says he received hydrocortisone from his dermatologist  and his itching is relieved. Return Visit-Office Procedure: Patient instructed to return to the office for a follow up visit 3 months for continued evaluation and treatment.    Gardiner Barefoot DPM

## 2018-11-16 DIAGNOSIS — I208 Other forms of angina pectoris: Secondary | ICD-10-CM | POA: Diagnosis not present

## 2018-11-16 DIAGNOSIS — R011 Cardiac murmur, unspecified: Secondary | ICD-10-CM | POA: Diagnosis not present

## 2018-11-16 DIAGNOSIS — R0602 Shortness of breath: Secondary | ICD-10-CM | POA: Diagnosis not present

## 2018-11-21 ENCOUNTER — Other Ambulatory Visit: Payer: Self-pay | Admitting: Family Medicine

## 2018-11-21 ENCOUNTER — Telehealth: Payer: Self-pay | Admitting: Family Medicine

## 2018-11-21 DIAGNOSIS — N521 Erectile dysfunction due to diseases classified elsewhere: Secondary | ICD-10-CM

## 2018-11-21 DIAGNOSIS — IMO0002 Reserved for concepts with insufficient information to code with codable children: Secondary | ICD-10-CM

## 2018-11-21 DIAGNOSIS — E1139 Type 2 diabetes mellitus with other diabetic ophthalmic complication: Secondary | ICD-10-CM

## 2018-11-21 DIAGNOSIS — H42 Glaucoma in diseases classified elsewhere: Secondary | ICD-10-CM

## 2018-11-21 DIAGNOSIS — E1165 Type 2 diabetes mellitus with hyperglycemia: Secondary | ICD-10-CM

## 2018-11-21 DIAGNOSIS — E1140 Type 2 diabetes mellitus with diabetic neuropathy, unspecified: Principal | ICD-10-CM

## 2018-11-21 NOTE — Telephone Encounter (Signed)
Copied from [place] [id number] [id number]. Topic: Quick Communication - Rx Refill/Question >> Nov 21, 2018 10:17 AM Virl Axe D wrote: Medication: ACCU-CHEK AVIVA PLUS test strip / [place] lancets / Pharmacy stated that pt has new insurance and it will not cover AccuChek supplies. It will cover One Touch. Requesting new rxs for One Touch Meter/ One Touch Lancets and One Touch Strips. Please advise.  Has the patient contacted their pharmacy? Yes.   (Agent: If no, request that the patient contact the pharmacy for the refill.) (Agent: If yes, when and what did the pharmacy advise?)  Preferred Pharmacy (with phone number or street name): [facility], [place]. [phone] (Phone) [phone] (Fax)    Agent: Please be advised that RX refills may take up to 3 business days. We ask that you follow-up with your pharmacy.

## 2018-11-21 NOTE — Telephone Encounter (Signed)
Refill request for diabetic medication:   Accu-Chek is no longer covered by insurance. Insurance prefers:  One Touch  Last office visit pertaining to diabetes: 10/19/2018  Lab Results  Component Value Date   HGBA1C 7.2 (A) 10/19/2018   HGBA1C 7.2 10/19/2018   HGBA1C 7.2 (A) 10/19/2018   HGBA1C 7.2 (A) 10/19/2018   Follow-ups on file. 02/21/2019

## 2018-11-22 ENCOUNTER — Other Ambulatory Visit: Payer: Self-pay | Admitting: Family Medicine

## 2018-11-22 DIAGNOSIS — E1139 Type 2 diabetes mellitus with other diabetic ophthalmic complication: Secondary | ICD-10-CM

## 2018-11-22 DIAGNOSIS — N521 Erectile dysfunction due to diseases classified elsewhere: Secondary | ICD-10-CM

## 2018-11-22 DIAGNOSIS — H42 Glaucoma in diseases classified elsewhere: Secondary | ICD-10-CM

## 2018-11-22 DIAGNOSIS — E1140 Type 2 diabetes mellitus with diabetic neuropathy, unspecified: Principal | ICD-10-CM

## 2018-11-22 DIAGNOSIS — E1165 Type 2 diabetes mellitus with hyperglycemia: Secondary | ICD-10-CM

## 2018-11-22 DIAGNOSIS — IMO0002 Reserved for concepts with insufficient information to code with codable children: Secondary | ICD-10-CM

## 2018-11-22 NOTE — Telephone Encounter (Signed)
Austin Hunt w/[name] called to f/u on request as pt has been back in this morning needing supplies. Please advise (also see request for metformin)

## 2018-11-23 DIAGNOSIS — E7800 Pure hypercholesterolemia, unspecified: Secondary | ICD-10-CM | POA: Diagnosis not present

## 2018-11-23 DIAGNOSIS — I25118 Atherosclerotic heart disease of native coronary artery with other forms of angina pectoris: Secondary | ICD-10-CM | POA: Diagnosis not present

## 2018-11-23 DIAGNOSIS — R0602 Shortness of breath: Secondary | ICD-10-CM | POA: Diagnosis not present

## 2018-11-23 DIAGNOSIS — Z7952 Long term (current) use of systemic steroids: Secondary | ICD-10-CM | POA: Diagnosis not present

## 2018-11-23 DIAGNOSIS — M353 Polymyalgia rheumatica: Secondary | ICD-10-CM | POA: Diagnosis not present

## 2018-11-23 DIAGNOSIS — I208 Other forms of angina pectoris: Secondary | ICD-10-CM | POA: Diagnosis not present

## 2018-11-23 DIAGNOSIS — I10 Essential (primary) hypertension: Secondary | ICD-10-CM | POA: Diagnosis not present

## 2018-11-23 DIAGNOSIS — R011 Cardiac murmur, unspecified: Secondary | ICD-10-CM | POA: Diagnosis not present

## 2018-11-23 DIAGNOSIS — Z955 Presence of coronary angioplasty implant and graft: Secondary | ICD-10-CM | POA: Diagnosis not present

## 2018-11-23 DIAGNOSIS — K4191 Unilateral femoral hernia, without obstruction or gangrene, recurrent: Secondary | ICD-10-CM | POA: Diagnosis not present

## 2018-11-24 NOTE — Telephone Encounter (Signed)
Pharmacy is calling again to follow up on new Rx for One touch Ultra testing strips, meters and lancets for pt who came to pharmacy because he needs diabetic supplies. Original request sent on 11/21/18. Accu-Chek no longer covered by insurance. Please reach back out to pharmacy with new rx and inform pt so he may pick up new supplies.

## 2018-11-25 MED ORDER — BLOOD GLUCOSE METER KIT: PACK | 0 refills | Status: DC

## 2018-12-02 NOTE — Telephone Encounter (Signed)
Spoke with [name] Pharmacist and they were unable to fill his DM meter and supplies because the Mail Order already filled it and is shipping to his house. Informed the patient and he is confused about the Mail Order and why he could not fill it again at [facility] but explained if it had already been filled to his insurance they will not be able to duplicate another charge. Patient verbalized understanding and informed to call his mail order to check on status.

## 2019-01-02 ENCOUNTER — Other Ambulatory Visit: Payer: Self-pay | Admitting: Family Medicine

## 2019-01-02 MED ORDER — GLUCOSE BLOOD VI STRP: ORAL_STRIP | 12 refills | Status: DC

## 2019-01-02 NOTE — Telephone Encounter (Signed)
What is covered?

## 2019-01-02 NOTE — Telephone Encounter (Signed)
I'm not sure what is covered by his insurance. Pharmacy states they need a prescription first to see what is covered. So send Accu Chek lancets and if it is denied we will try something else.

## 2019-01-02 NOTE — Telephone Encounter (Signed)
Copied from [place] [id number]. Topic: General - Other >> Jan 02, 2019  8:48 AM Carolyn Stare wrote:  Pt call to req lancets soft clix strips 100 box  Pharmacy [name]

## 2019-01-19 ENCOUNTER — Other Ambulatory Visit: Payer: Self-pay | Admitting: Family Medicine

## 2019-01-19 DIAGNOSIS — N521 Erectile dysfunction due to diseases classified elsewhere: Secondary | ICD-10-CM

## 2019-01-19 DIAGNOSIS — E1165 Type 2 diabetes mellitus with hyperglycemia: Secondary | ICD-10-CM

## 2019-01-19 DIAGNOSIS — IMO0002 Reserved for concepts with insufficient information to code with codable children: Secondary | ICD-10-CM

## 2019-01-19 DIAGNOSIS — E1140 Type 2 diabetes mellitus with diabetic neuropathy, unspecified: Principal | ICD-10-CM

## 2019-01-19 DIAGNOSIS — H42 Glaucoma in diseases classified elsewhere: Secondary | ICD-10-CM

## 2019-01-19 DIAGNOSIS — E1139 Type 2 diabetes mellitus with other diabetic ophthalmic complication: Secondary | ICD-10-CM

## 2019-01-19 NOTE — Telephone Encounter (Signed)
Request for diabetes medication. Metformin to [name]   Last office visit pertaining to diabetes: 10/19/2018   Lab Results  Component Value Date   HGBA1C 7.2 (A) 10/19/2018   HGBA1C 7.2 10/19/2018   HGBA1C 7.2 (A) 10/19/2018   HGBA1C 7.2 (A) 10/19/2018     Follow up on 02/21/2019

## 2019-02-02 ENCOUNTER — Ambulatory Visit: Payer: Medicare Other | Admitting: Podiatry

## 2019-02-21 ENCOUNTER — Other Ambulatory Visit: Payer: Self-pay

## 2019-02-21 ENCOUNTER — Ambulatory Visit (INDEPENDENT_AMBULATORY_CARE_PROVIDER_SITE_OTHER): Payer: Medicare Other | Admitting: Family Medicine

## 2019-02-21 ENCOUNTER — Encounter: Payer: Self-pay | Admitting: Family Medicine

## 2019-02-21 DIAGNOSIS — H42 Glaucoma in diseases classified elsewhere: Secondary | ICD-10-CM

## 2019-02-21 DIAGNOSIS — E1139 Type 2 diabetes mellitus with other diabetic ophthalmic complication: Secondary | ICD-10-CM | POA: Diagnosis not present

## 2019-02-21 DIAGNOSIS — Z1211 Encounter for screening for malignant neoplasm of colon: Secondary | ICD-10-CM

## 2019-02-21 DIAGNOSIS — N521 Erectile dysfunction due to diseases classified elsewhere: Secondary | ICD-10-CM

## 2019-02-21 DIAGNOSIS — K5909 Other constipation: Secondary | ICD-10-CM | POA: Diagnosis not present

## 2019-02-21 DIAGNOSIS — E1140 Type 2 diabetes mellitus with diabetic neuropathy, unspecified: Secondary | ICD-10-CM | POA: Diagnosis not present

## 2019-02-21 DIAGNOSIS — I10 Essential (primary) hypertension: Secondary | ICD-10-CM | POA: Diagnosis not present

## 2019-02-21 DIAGNOSIS — E785 Hyperlipidemia, unspecified: Secondary | ICD-10-CM | POA: Diagnosis not present

## 2019-02-21 MED ORDER — LUBIPROSTONE 8 MCG PO CAPS: 8.0000 ug | ORAL_CAPSULE | Freq: Two times a day (BID) | ORAL | 0 refills | Status: DC

## 2019-02-21 MED ORDER — METFORMIN HCL ER 500 MG PO TB24: ORAL_TABLET | ORAL | 1 refills | Status: DC

## 2019-02-21 NOTE — Progress Notes (Signed)
Name: Austin Hunt   MRN: [id number]    DOB: Jun 02, 1941   Date:02/21/2019       Progress Note  Subjective  Chief Complaint  Chief Complaint  Patient presents with  . Diabetes  . Hypertension  . Anxiety  . Hyperlipidemia    I connected with  Austin Hunt  on 02/21/19 at 10:00 AM EDT by a video enabled telemedicine application and verified that I am speaking with the correct person using two identifiers.  I discussed the limitations of evaluation and management by telemedicine and the availability of in person appointments. The patient expressed understanding and agreed to proceed. Staff also discussed with the patient that there may be a patient responsible charge related to this service. Patient Location: at home  Provider Location: [facility]   HPI  DMII: glucose at home is around 114-140, he states he skips meals at times and glucose drops,he is avoiding skipping meals lately and he feels better.  HbgA1C was  down to 7.2% He denies polyphagia, polydipsia or polyuria. Vision is better since he had glaucoma and cataract surgery.He also has ED and sees Urologist and has rx of Viagra at home.Nocturia usually once per night.He has been taking Metformin also. He is off Glipizide now   Leukopenia: WBC was low , down to 3.1 but last checked 10/2018 and back to normal   BPH and prostate cancer: he is seeing Urologist and denies any obstructive symptoms, except for nocturia times one at most.he is on yearly follow up now. Unchanged   HTN: patient is off medication, no chest pain or palpitation, he states bp at home has been around 125/78.   Dyslipidemia: he is not on statin therapy, he refuses medication - myalgia in the past, he was  taking Zetia now and last LDL went from 181 to 120 , HDL at goal at 51, triglycerides was elevated but not sure if he was fasting. Monitor for now Continue daily aspirin   Chronic constipation: taking Amitiza prn because it  causes diarrhea, we will try lower dose and monitor   Chest pain: seen by Dr. Clayborn Bigness 11/2018 and stress test results below:   Normal myocardial perfusion scan no evidence of stress-induced  myocardial ischemia ejection fraction of 64% conclusion negative scan.  Low risk scan  Patient Active Problem List   Diagnosis Date Noted  . Prostate cancer ([place]) 02/07/2018  . Cervical pain (neck) 10/13/2016  . 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 Long term current use of systemic steroids 01/07/2016  . Neutropenia ([place]) 01/07/2016  . Anxiety 04/08/2015  . 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 Chronic constipation 04/08/2015  . 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 Failure of erection 04/08/2015  . H/O inguinal hernia repair 04/08/2015  . HLD (hyperlipidemia) 04/08/2015  . 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 LBP (low back pain) 04/08/2015  . 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 Drug intolerance 04/08/2015  . Kidney lump 04/08/2015  . TI (tricuspid incompetence) 04/08/2015  . Unilateral recurrent femoral hernia without obstruction or gangrene 05/23/2014  . Femoral hernia 05/23/2014  . Right inguinal hernia 02/06/2014  . Cataract 01/16/2014  . Glaucoma 01/16/2014  . Reflux esophagitis 06/21/2009  . Coronary atherosclerosis 04/03/2009  . Benign enlargement of prostate 02/11/2009  . Decreased libido 01/12/2008  . Benign essential HTN 09/12/2007    Past Surgical History:  Procedure Laterality Date  . APPENDECTOMY  1954  . BLADDER SURGERY  Oct 2015  .  CARDIAC CATHETERIZATION  2002   1 stent  . COLONOSCOPY    . EYE SURGERY  2011, 2013   cataracts  . EYE SURGERY Right May 2016   glaucoma  . HERNIA REPAIR Left 1967  . HERNIA REPAIR Right 05-10-14   Right femoral hernia repair Dr Bary Castilla with PerFix plug, no onlay mesh..   . PHOTOCOAGULATION WITH LASER Right 12/30/2015   Procedure: PHOTOCOAGULATION WITH LASER;  Surgeon: Ronnell Freshwater,  MD;  Location: [place];  Service: Ophthalmology;  Laterality: Right;  DIABETIC - oral meds IVA BLOCK  . PROSTATE SURGERY  Oct 2015    Family History  Problem Relation Age of Onset  . Diabetes Sister   . Diabetes Brother   . Diabetes Sister   . Prostate cancer Neg Hx   . Bladder Cancer Neg Hx     Social History   Socioeconomic History  . Marital status: Married    Spouse name: Austin Hunt  . Number of children: 4  . Years of education: some college  . Highest education level: 12th grade  Occupational History    Employer: RETIRED  Social Needs  . Financial resource strain: Not hard at all  . Food insecurity:    Worry: Never true    Inability: Never true  . Transportation needs:    Medical: No    Non-medical: No  Tobacco Use  . Smoking status: Never Smoker  . Smokeless tobacco: Never Used  . Tobacco comment: smoking cessation materials not required  Substance and Sexual Activity  . Alcohol use: No  . Drug use: No  . Sexual activity: Not Currently  Lifestyle  . Physical activity:    Days per week: 4 days    Minutes per session: 60 min  . Stress: Not at all  Relationships  . Social connections:    Talks on phone: Patient refused    Gets together: Patient refused    Attends religious service: Patient refused    Active member of club or organization: Patient refused    Attends meetings of clubs or organizations: Patient refused    Relationship status: Married  . Intimate partner violence:    Fear of current or ex partner: No    Emotionally abused: No    Physically abused: No    Forced sexual activity: No  Other Topics Concern  . Not on file  Social History Narrative  . Not on file     Current Outpatient Medications:  .  aspirin 81 MG tablet, Take 81 mg by mouth daily. , Disp: , Rfl:  .  brimonidine-timolol (COMBIGAN) 0.2-0.5 % ophthalmic solution, Place 1 drop into both eyes every 12 (twelve) hours., Disp: , Rfl:  .  diclofenac sodium (VOLTAREN) 1 %  GEL, Apply 2 g topically 4 (four) times daily., Disp: 100 g, Rfl: 2 .  dorzolamide (TRUSOPT) 2 % ophthalmic solution, , Disp: , Rfl:  .  ezetimibe (ZETIA) 10 MG tablet, Take 1 tablet (10 mg total) by mouth daily., Disp: 90 tablet, Rfl: 3 .  Garlic 10 MG CAPS, Take 1 capsule by mouth daily. , Disp: , Rfl:  .  hydrocortisone 2.5 % cream, , Disp: , Rfl:  .  lubiprostone (AMITIZA) 24 MCG capsule, Take 1 capsule (24 mcg total) by mouth 2 (two) times daily with a meal., Disp: 180 capsule, Rfl: 0 .  metFORMIN (GLUCOPHAGE-XR) 500 MG 24 hr tablet, TAKE 2 TABLETS BY MOUTH ONCE EVERY MORNING AND 1 TABLET AT BEDTIME, Disp: 270 tablet,  Rfl: 1 .  Multiple Vitamin (MULTIVITAMIN) tablet, Take 1 tablet by mouth daily., Disp: , Rfl:  .  OMEGA-3 FATTY ACIDS PO, Take 1 tablet by mouth daily., Disp: , Rfl:  .  travoprost, benzalkonium, (TRAVATAN) 0.004 % ophthalmic solution, 1 drop at bedtime., Disp: , Rfl:  .  VIAGRA 100 MG tablet, Take 1 tablet (100 mg total) by mouth daily as needed for erectile dysfunction., Disp: 10 tablet, Rfl: 11 .  vitamin C (ASCORBIC ACID) 500 MG tablet, Take 500 mg by mouth daily., Disp: , Rfl:  .  Zinc Acetate (GALZIN) 50 MG CAPS, Take 1 capsule by mouth 3 (three) times a week., Disp: , Rfl:  .  ACCU-CHEK SOFTCLIX LANCETS lancets, USE TO TEST TWICE DAILY, Disp: 100 each, Rfl: 5 .  blood glucose meter kit and supplies, Dispense based on patient and insurance preference. Use up to four times daily as directed. (FOR ICD-10 E10.9, E11.9)., Disp: 1 each, Rfl: 0 .  calcium carbonate (OS-CAL - DOSED IN MG OF ELEMENTAL CALCIUM) 1250 (500 Ca) MG tablet, Take by mouth., Disp: , Rfl:  .  glucose blood (ACCU-CHEK ACTIVE STRIPS) test strip, Use as instructed, Disp: 100 each, Rfl: 12 .  isosorbide mononitrate (IMDUR) 30 MG 24 hr tablet, Take by mouth., Disp: , Rfl:   Current Facility-Administered Medications:  .  diclofenac sodium (VOLTAREN) 1 % transdermal gel 2 g, 2 g, Topical, QID, Gardiner Barefoot,  DPM  Allergies  Allergen Reactions  . Colesevelam Other (See Comments)  . Penicillins Itching and Other (See Comments)  . Statins Other (See Comments)    myalgia  . Welchol [Colesevelam Hcl]     I personally reviewed active problem list, medication list, allergies, family history, social history with the patient/caregiver today.   ROS  Constitutional: Negative for fever or weight change.  Respiratory: Negative for cough and shortness of breath.   Cardiovascular: Negative for chest pain or palpitations.  Gastrointestinal: Negative for abdominal pain, no bowel changes.  Musculoskeletal: Negative for gait problem or joint swelling.  Skin: Negative for rash.  Neurological: Negative for dizziness or headache.  No other specific complaints in a complete review of systems (except as listed in HPI above).  Objective  Virtual encounter, vitals not obtained.   Physical Exam  Awake, alert and oriented   PHQ2/9: Depression screen [facility] 2/9 02/21/2019 05/24/2018 05/27/2017 01/04/2017 08/31/2016  Decreased Interest 0 0 0 0 0  Down, Depressed, Hopeless 0 0 0 0 0  PHQ - 2 Score 0 0 0 0 0  Altered sleeping 0 0 - - -  Tired, decreased energy 0 0 - - -  Change in appetite 0 0 - - -  Feeling bad or failure about yourself  0 0 - - -  Trouble concentrating 0 0 - - -  Moving slowly or fidgety/restless 0 0 - - -  Suicidal thoughts 0 0 - - -  PHQ-9 Score 0 0 - - -  Difficult doing work/chores - Not difficult at all - - -   PHQ-2/9 Result is negative.    Fall Risk: Fall Risk  02/21/2019 10/19/2018 05/24/2018 02/07/2018 10/07/2017  Falls in the past year? 0 0 No No No  Number falls in past yr: 0 0 - - -  Injury with Fall? 0 0 - - -  Risk for fall due to : - - Impaired vision;History of fall(s) - -  Risk for fall due to: Comment - - wears eyeglasses; h/o tripping - -  Assessment & Plan  1. 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 Chronic constipation  - lubiprostone (AMITIZA) 8 MCG capsule; Take 1 capsule (8 mcg total) by mouth 2 (two) times daily with a meal.  Dispense: 180 capsule; Refill: 0  4. Colon cancer screening  - Cologuard  5. Dyslipidemia  Advised him to resume Zetia   6. Essential hypertension  bp is at goal at home, off Toprol    I discussed the assessment and treatment plan with the patient. The patient was provided an opportunity to ask questions and all were answered. The patient agreed with the plan and demonstrated an understanding of the instructions.  The patient was advised to call back or seek an in-person evaluation if the symptoms worsen or if the condition fails to improve as anticipated.  I provided 25 minutes of non-face-to-face time during this encounter.

## 2019-02-22 ENCOUNTER — Telehealth: Payer: Self-pay

## 2019-02-22 NOTE — Telephone Encounter (Signed)
Copied from [place] [id number]. Topic: General - Other >> Feb 22, 2019 12:30 PM Antonieta Iba C wrote: Reason for CRM: pt says that he is returning a call about his cologuard results. Pt would like a call back.

## 2019-02-24 NOTE — Telephone Encounter (Signed)
Called patient to inform him that request has been faxed and sent. He should get a package delivered to him in 2-3 weeks.

## 2019-03-06 DIAGNOSIS — Z1211 Encounter for screening for malignant neoplasm of colon: Secondary | ICD-10-CM | POA: Diagnosis not present

## 2019-03-09 ENCOUNTER — Encounter: Payer: Self-pay | Admitting: Podiatry

## 2019-03-09 ENCOUNTER — Ambulatory Visit (INDEPENDENT_AMBULATORY_CARE_PROVIDER_SITE_OTHER): Payer: Medicare Other | Admitting: Podiatry

## 2019-03-09 ENCOUNTER — Other Ambulatory Visit: Payer: Self-pay

## 2019-03-09 VITALS — Temp 99.2°F

## 2019-03-09 DIAGNOSIS — B351 Tinea unguium: Secondary | ICD-10-CM

## 2019-03-09 DIAGNOSIS — E119 Type 2 diabetes mellitus without complications: Secondary | ICD-10-CM

## 2019-03-09 DIAGNOSIS — M79676 Pain in unspecified toe(s): Secondary | ICD-10-CM | POA: Diagnosis not present

## 2019-03-09 DIAGNOSIS — M2010 Hallux valgus (acquired), unspecified foot: Secondary | ICD-10-CM

## 2019-03-09 LAB — COLOGUARD: Cologuard: NEGATIVE

## 2019-03-09 NOTE — Progress Notes (Signed)
Complaint:  Visit Type: Patient returns to my office for continued preventative foot care services. Complaint: Patient states" my nails have grown long and thick and become painful to walk and wear shoes" Patient has been diagnosed with DM with neuropathy. The patient presents for preventative foot care services. No changes to ROS  Podiatric Exam: Vascular: dorsalis pedis and posterior tibial pulses are palpable bilateral. Capillary return is immediate. Temperature gradient is WNL. Skin turgor WNL  Sensorium: Normal Semmes Weinstein monofilament test. Normal tactile sensation bilaterally. Nail Exam: Pt has thick disfigured discolored nails with subungual debris noted bilateral entire nail hallux through fifth toenails Ulcer Exam: There is no evidence of ulcer or pre-ulcerative changes or infection. Orthopedic Exam: Muscle tone and strength are WNL. No limitations in general ROM. No crepitus or effusions noted. Foot type and digits show no abnormalities.  HAV  B/L. Bony prominence at fifth metabase. Skin: No Porokeratosis. No infection or ulcers  Diagnosis:  Onychomycosis, , Pain in right toe, pain in left toes,  Diabetes with neuropathy  HAV  B/L  Treatment & Plan Procedures and Treatment: Consent by patient was obtained for treatment procedures. The patient understood the discussion of treatment and procedures well. All questions were answered thoroughly reviewed. Debridement of mycotic and hypertrophic toenails, 1 through 5 bilateral and clearing of subungual debris. No ulceration, no infection noted.       Return Visit-Office Procedure: Patient instructed to return to the office for a follow up visit 4 months for continued evaluation and treatment.    Gardiner Barefoot DPM

## 2019-03-10 ENCOUNTER — Encounter: Payer: Self-pay | Admitting: Family Medicine

## 2019-03-14 ENCOUNTER — Ambulatory Visit: Payer: Self-pay | Admitting: Pharmacist

## 2019-03-14 NOTE — Chronic Care Management (AMB) (Signed)
Chronic Care Management   Note  03/14/2019 Name: Austin Hunt MRN: [id number] DOB: [birth year]  Austin Hunt is a 78 y.o. year old male who is a primary care patient of [name], MD. I reached out to [name] by phone today in response to a referral sent by [name]'s health plan.    [name] was given information about Chronic Care Management services today including:  1. CCM service includes personalized support from designated clinical staff supervised by his physician, including individualized plan of care and coordination with other care providers 2. 24/7 contact phone numbers for assistance for urgent and routine care needs. 3. Service will only be billed when office clinical staff spend 20 minutes or more in a month to coordinate care. 4. Only one practitioner may furnish and bill the service in a calendar month. 5. The patient may stop CCM services at any time (effective at the end of the month) by phone call to the office staff. 6. The patient will be responsible for cost sharing (co-pay) of up to 20% of the service fee (after annual deductible is met).  Patient agreed to services and verbal consent obtained.   Follow up plan: Telephone appointment with CCM team member scheduled for:03/15/2019  [place]  ??[email or web address]   ??[phone]

## 2019-03-15 ENCOUNTER — Ambulatory Visit (INDEPENDENT_AMBULATORY_CARE_PROVIDER_SITE_OTHER): Payer: Medicare Other | Admitting: Pharmacist

## 2019-03-15 DIAGNOSIS — E1139 Type 2 diabetes mellitus with other diabetic ophthalmic complication: Secondary | ICD-10-CM

## 2019-03-15 DIAGNOSIS — K5909 Other constipation: Secondary | ICD-10-CM

## 2019-03-15 DIAGNOSIS — N521 Erectile dysfunction due to diseases classified elsewhere: Secondary | ICD-10-CM

## 2019-03-15 DIAGNOSIS — E1140 Type 2 diabetes mellitus with diabetic neuropathy, unspecified: Secondary | ICD-10-CM | POA: Diagnosis not present

## 2019-03-15 DIAGNOSIS — H42 Glaucoma in diseases classified elsewhere: Secondary | ICD-10-CM

## 2019-03-15 NOTE — Chronic Care Management (AMB) (Signed)
Chronic Care Management   Note  03/15/2019 Name: Austin Hunt MRN: [id number] DOB: [birth year]  Subjective:   Does the patient  feel that his/her medications are working for him/her?  yes  Has the patient been experiencing any side effects to the medications prescribed?  yes  Does the patient measure his/her own blood glucose at home?  yes   Does the patient measure his/her own blood pressure at home? no   Does the patient have any problems obtaining medications due to transportation or finances?   no  Understanding of regimen: good Understanding of indications: good Potential of compliance: good  Objective: Lab Results  Component Value Date   CREATININE 0.88 10/19/2018   CREATININE 0.94 05/24/2018   CREATININE 0.87 10/07/2017    Lab Results  Component Value Date   HGBA1C 7.2 (A) 10/19/2018   HGBA1C 7.2 10/19/2018   HGBA1C 7.2 (A) 10/19/2018   HGBA1C 7.2 (A) 10/19/2018    Lipid Panel     Component Value Date/Time   CHOL 204 (H) 10/19/2018 1435   CHOL 243 (H) 04/08/2015 0919   TRIG 209 (H) 10/19/2018 1435   HDL 51 10/19/2018 1435   HDL 55 04/08/2015 0919   CHOLHDL 4.0 10/19/2018 1435   VLDL 24 08/27/2016 0914   LDLCALC 120 (H) 10/19/2018 1435    BP Readings from Last 3 Encounters:  10/19/18 138/70  05/24/18 122/62  05/24/18 122/62    Allergies  Allergen Reactions   Colesevelam Other (See Comments)   Penicillins Itching and Other (See Comments)   Statins Other (See Comments)    myalgia   Welchol [Colesevelam Hcl]     Medications Reviewed Today    Reviewed by Cathi Roan, [facility] (Pharmacist) on 03/15/19 at [id number]  Med List Status: <None>  Medication Order Taking? Sig Documenting Provider Last Dose Status Informant  ACCU-CHEK SOFTCLIX LANCETS lancets [id number] Yes USE TO TEST TWICE DAILY [name], MD Taking Active   aspirin 81 MG tablet [id number] Yes Take 81 mg by mouth daily.  [provider] Taking Active   blood glucose meter  kit and supplies [id number] Yes Dispense based on patient and insurance preference. Use up to four times daily as directed. (FOR ICD-10 E10.9, E11.9). [name], MD Taking Active   brimonidine-timolol (COMBIGAN) 0.2-0.5 % ophthalmic solution [id number] Yes Place 1 drop into both eyes every 12 (twelve) hours. [provider] Taking Active   diclofenac sodium (VOLTAREN) 1 % GEL [id number] Yes Apply 2 g topically 4 (four) times daily. Gardiner Barefoot, DPM Taking Active   dorzolamide (TRUSOPT) 2 % ophthalmic solution [id number] Yes  [provider] Taking Active            Med Note Inda Coke   Thu Dec 19, 2015 12:02 PM) Received from: External Pharmacy  ezetimibe (ZETIA) 10 MG tablet [id number] Yes Take 1 tablet (10 mg total) by mouth daily. [name], MD Taking Active   Garlic 10 MG CAPS [id number] Yes Take 1 capsule by mouth daily.  [provider] Taking Active            Med Note Armandina Gemma, Berdie Ogren   Wed Aug 14, 2015  8:26 AM) Received from: [place]  glucose blood [facility] ACTIVE STRIPS) test strip [id number] Yes Use as instructed [name], MD Taking Active   hydrocortisone 2.5 % cream [id number]   [provider]  Active   isosorbide mononitrate (IMDUR) 30 MG 24 hr tablet [id number] No Take by mouth.  [provider] Not Taking Active   latanoprost (XALATAN) 0.005 % ophthalmic solution [id number] Yes  [provider] Taking Active   lubiprostone (AMITIZA) 8 MCG capsule [id number] No Take 1 capsule (8 mcg total) by mouth 2 (two) times daily with a meal.  Patient not taking:  Reported on 03/15/2019   [name], MD Not Taking Active   metFORMIN (GLUCOPHAGE-XR) 500 MG 24 hr tablet [id number] Yes TAKE 2 TABLETS BY MOUTH ONCE EVERY MORNING AND 1 TABLET AT BEDTIME [name], MD Taking Active   Multiple Vitamin (MULTIVITAMIN) tablet [id number] Yes Take 1 tablet by mouth daily. [provider]  Taking Active   OMEGA-3 FATTY ACIDS PO [id number] Yes Take 1 tablet by mouth daily. [provider] Taking Active            Med Note Armandina Gemma, Edson Snowball Apr 08, 2015  8:42 AM) Received from: [place]:   travoprost, benzalkonium, (TRAVATAN) 0.004 % ophthalmic solution [id number] No 1 drop at bedtime. [provider] Not Taking Active   VIAGRA 100 MG tablet [id number]  Take 1 tablet (100 mg total) by mouth daily as needed for erectile dysfunction. Hollice Espy, MD  Active   vitamin C (ASCORBIC ACID) 500 MG tablet [id number] Yes Take 500 mg by mouth daily.  [provider] Taking Active   Zinc Acetate (GALZIN) 50 MG CAPS [id number] Yes Take 1 capsule by mouth 3 (three) times a week. [provider] Taking Active            Assessment:   Total Number of meds:  9 prescription, 6 OTC  Indications for all medications: _0  Yes       _1  No  Adherence Review  _2  Excellent (no doses missed/week)     _3  Good (no more than 1 dose missed/week)     _4  Partial (2-3 doses missed/week)     _5  Poor (>3 doses missed/week)  Intervention  YES NO  Explanation   Unnecessary drug therapy      Duplicate Therapy <JJOACZYSAYTKZSWF>_0<\/XNATFTDDUKGURKYH>_0  _7     No indication _8  _9  Patient is a strong believer in alternative medicine, OTCs   Treating avoidable ADE _10  _11    Synergistic therapy   _12  _13     Safety/Adverse Med Event      Contraindication present _14  _15  Viagra PRN and isosorbide mononitrate- provided counseling   Unsafe medication _16  _17     Allergic reaction _18  _19     Drug interaction _20  _21     Excessive dose/duration _22  _23     Undesirable side effect _24  _25     Other pertinent pharmacist  counseling      Goals Addressed            This Visit's Progress    I need a constipation medicine that doesn't use animal products (pt-stated)       Current Barriers:   Chronic constipation  Long time vegetarian, avoids animal products such as gelatin used in many OTCs    Uses prune juice for constipation, but this gives him diarrhea  Pharmacist Clinical Goal(s):   Over the next 30 days, patient and clinical pharmacist will work together to find a suitable treatment for chronic constipation that meet's patients lifestyle.   Interventions:  Identify alternative therapies for chronic constipation that are compatible with patient's vegetarian lifestyle and beliefs.   Patient Self Care Activities:   Calls pharmacy for medication refills  Calls provider office for new concerns or questions  Initial goal  documentation      My eye drops are expensive (pt-stated)       Current Barriers:   financial  Pharmacist Clinical Goal(s): Over the next 30 days, Mr.. Kuenzel will provide the necessary supplementary documents (proof of out of pocket prescription expenditure, proof of household income) needed for medication assistance applications to CCM pharmacist pending available programs or foundations.   Interventions:  CCM pharmacist will research for medication assistance program for Combigan, Trusopt, and Xalatan (prescriber: Dr[facility] Le[facility]rew [facility]agi at [facility])  Patient Self Care Activities:   Gather necessary documents needed to apply for medication assistance  Initial goal documentation          Plan: Recommendations discussed with provider - DC Viagra  OR  - Change isosorbide mononitrate to calcium channel blocker or beta blocker for CAD?   - Additional therapy needed for cholesterol lowering  Recommendations discussed with patient - Please do not take Viagra while you are taking isosorbide mononitrate  Follow up: Telephone follow up appointment with CCM team member scheduled for: 7-10 days with PharmD   Ruben Reason, PharmD Clinical Pharmacist [facility] [phone]

## 2019-03-16 DIAGNOSIS — J340 Abscess, furuncle and carbuncle of nose: Secondary | ICD-10-CM | POA: Diagnosis not present

## 2019-03-16 DIAGNOSIS — R221 Localized swelling, mass and lump, neck: Secondary | ICD-10-CM | POA: Diagnosis not present

## 2019-03-21 DIAGNOSIS — H401133 Primary open-angle glaucoma, bilateral, severe stage: Secondary | ICD-10-CM | POA: Diagnosis not present

## 2019-04-04 ENCOUNTER — Telehealth: Payer: Self-pay

## 2019-04-04 NOTE — Telephone Encounter (Signed)
Copied from [place] [phone]. Topic: General - Other >> Apr 03, 2019  4:50 PM Pauline Good wrote: Reason for CRM: pt want results of his colorguard test

## 2019-04-04 NOTE — Telephone Encounter (Signed)
Patient was called back and notified his Cologuard results came back Negative.

## 2019-04-04 NOTE — Telephone Encounter (Signed)
Copied from [place] [phone]. Topic: General - Other >> Apr 03, 2019  4:50 PM Pauline Good wrote: Reason for CRM: pt want results of his colorguard test

## 2019-04-13 ENCOUNTER — Telehealth: Payer: Self-pay | Admitting: Family Medicine

## 2019-04-13 NOTE — Telephone Encounter (Signed)
Patient would like to know how long is he is suppose to be on his ezetimibe (ZETIA) 10 MG tablet medication.

## 2019-04-14 NOTE — Telephone Encounter (Signed)
Patient called.  Patient aware.  

## 2019-04-19 ENCOUNTER — Other Ambulatory Visit: Payer: Self-pay

## 2019-04-19 DIAGNOSIS — C61 Malignant neoplasm of prostate: Secondary | ICD-10-CM

## 2019-04-21 ENCOUNTER — Other Ambulatory Visit: Payer: Medicare Other

## 2019-04-25 ENCOUNTER — Other Ambulatory Visit: Payer: Self-pay | Admitting: *Deleted

## 2019-04-25 ENCOUNTER — Encounter: Payer: Self-pay | Admitting: Urology

## 2019-04-25 ENCOUNTER — Ambulatory Visit (INDEPENDENT_AMBULATORY_CARE_PROVIDER_SITE_OTHER): Payer: Medicare Other | Admitting: Urology

## 2019-04-25 ENCOUNTER — Other Ambulatory Visit: Payer: Self-pay

## 2019-04-25 ENCOUNTER — Other Ambulatory Visit: Payer: Self-pay | Admitting: Urology

## 2019-04-25 VITALS — BP 134/74 | HR 60 | Ht 69.0 in | Wt 135.0 lb

## 2019-04-25 DIAGNOSIS — C61 Malignant neoplasm of prostate: Secondary | ICD-10-CM

## 2019-04-25 DIAGNOSIS — R351 Nocturia: Secondary | ICD-10-CM | POA: Diagnosis not present

## 2019-04-25 DIAGNOSIS — N5203 Combined arterial insufficiency and corporo-venous occlusive erectile dysfunction: Secondary | ICD-10-CM

## 2019-04-25 MED ORDER — VIAGRA 100 MG PO TABS: 100.0000 mg | ORAL_TABLET | Freq: Every day | ORAL | 11 refills | Status: DC | PRN

## 2019-04-25 MED ORDER — SILDENAFIL CITRATE 100 MG PO TABS: 100.0000 mg | ORAL_TABLET | Freq: Every day | ORAL | 6 refills | Status: DC | PRN

## 2019-04-25 NOTE — Addendum Note (Signed)
Addended by: Kyra Manges on: 04/25/2019 03:02 PM   Modules accepted: Orders

## 2019-04-25 NOTE — Telephone Encounter (Signed)
[name] called and asked if you could call in the generic Viagra for pt. She states that rx is over $700 with insurance and pt can not afford it. Please advise.

## 2019-04-25 NOTE — Progress Notes (Signed)
04/25/2019 10:01 AM   Austin Hunt September 30, 1941 [id number]  Referring provider: [name], MD [address] [place],  [place] [address]  Chief Complaint  Patient presents with  . Prostate Cancer    Follow up    HPI: 78 year old male who returns today for his routine annual urologic follow-up for history of BPH and prostate cancer.  He underwent TURP on 08/27/2014 no longer on Flomax and dutasteride. Prior to surgery, he did have a history of elevated postvoid residuals as well as bladder stones. Pathology at the time of TURP showed incidental small focus of Gleason 3+3 prostate cancer. His PSA at the time of diagnosis was 0.7 on dutasteride.   Most recent PSA on 6/19 was 1.2.  PSA was ordered today and is pending.  From a voiding perspective, he is doing well.  He feels like his stream is good.  He is not taking any BPH medicines.  He does mention that he has nocturia x1-2 but this is chronic.  He is not particular bothered by this.  He feels like a something is bladder.  No dysuria or gross hematuria.  He does also mention that his urine sometimes has an odor to it but without any other symptoms.  He does have erectile dysfunction.  He is using sildenafil but prefers name brand.    No side effects or issues with this med.      PMH: Past Medical History:  Diagnosis Date  . 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 Balanitis   . BPH (benign prostatic hypertrophy) with urinary obstruction   . 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 Diabetes mellitus without complication ([facility])   . Glaucoma   . Headache    rare - 1x/mo  . Nocturia   . Prostate cancer [facility])     Surgical History: Past Surgical History:  Procedure Laterality Date  . APPENDECTOMY  1954  . BLADDER SURGERY  Oct 2015  . CARDIAC CATHETERIZATION  2002   1 stent  . COLONOSCOPY    . EYE SURGERY  2011, 2013   cataracts  . EYE SURGERY Right May 2016   glaucoma  . HERNIA REPAIR Left 1967  . HERNIA REPAIR  Right 05-10-14   Right femoral hernia repair Dr Bary Castilla with PerFix plug, no onlay mesh..   . PHOTOCOAGULATION WITH LASER Right 12/30/2015   Procedure: PHOTOCOAGULATION WITH LASER;  Surgeon: Ronnell Freshwater, MD;  Location: [facility];  Service: Ophthalmology;  Laterality: Right;  DIABETIC - oral meds IVA BLOCK  . PROSTATE SURGERY  Oct 2015    Home Medications:  Allergies as of 04/25/2019      Reactions   Colesevelam Other (See Comments)   Penicillins Itching, Other (See Comments)   Statins Other (See Comments)   myalgia   Welchol [colesevelam Hcl]       Medication List       Accurate as of April 25, 2019 10:01 AM. If you have any questions, ask your nurse or doctor.        Accu-Chek Softclix Lancets lancets USE TO TEST TWICE DAILY   aspirin 81 MG tablet Take 81 mg by mouth daily.   blood glucose meter kit and supplies Dispense based on patient and insurance preference. Use up to four times daily as directed. (FOR ICD-10 E10.9, E11.9).   Combigan 0.2-0.5 % ophthalmic solution Generic drug: brimonidine-timolol Place 1 drop into both eyes every 12 (twelve) hours.   diclofenac sodium 1 %  Gel Commonly known as: VOLTAREN Apply 2 g topically 4 (four) times daily.   dorzolamide 2 % ophthalmic solution Commonly known as: TRUSOPT   ezetimibe 10 MG tablet Commonly known as: Zetia Take 1 tablet (10 mg total) by mouth daily.   Galzin 50 MG Caps Generic drug: Zinc Acetate Take 1 capsule by mouth 3 (three) times a week.   Garlic 10 MG Caps Take 1 capsule by mouth daily.   glucose blood test strip Commonly known as: ACCU-CHEK ACTIVE STRIPS Use as instructed   hydrocortisone 2.5 % cream   isosorbide mononitrate 30 MG 24 hr tablet Commonly known as: IMDUR Take by mouth.   latanoprost 0.005 % ophthalmic solution Commonly known as: XALATAN   lubiprostone 8 MCG capsule Commonly known as: AMITIZA Take 1 capsule (8 mcg total) by mouth 2 (two) times daily  with a meal.   metFORMIN 500 MG 24 hr tablet Commonly known as: GLUCOPHAGE-XR TAKE 2 TABLETS BY MOUTH ONCE EVERY MORNING AND 1 TABLET AT BEDTIME   multivitamin tablet Take 1 tablet by mouth daily.   OMEGA-3 FATTY ACIDS PO Take 1 tablet by mouth daily.   travoprost (benzalkonium) 0.004 % ophthalmic solution Commonly known as: TRAVATAN 1 drop at bedtime.   Viagra 100 MG tablet Generic drug: sildenafil Take 1 tablet (100 mg total) by mouth daily as needed for erectile dysfunction.   vitamin C 500 MG tablet Commonly known as: ASCORBIC ACID Take 500 mg by mouth daily.       Allergies:  Allergies  Allergen Reactions  . Colesevelam Other (See Comments)  . Penicillins Itching and Other (See Comments)  . Statins Other (See Comments)    myalgia  . Welchol [Colesevelam Hcl]     Family History: Family History  Problem Relation Age of Onset  . Diabetes Sister   . Diabetes Brother   . Diabetes Sister   . Prostate cancer Neg Hx   . Bladder Cancer Neg Hx     Social History:  reports that he has never smoked. He has never used smokeless tobacco. He reports that he does not drink alcohol or use drugs.  ROS: UROLOGY Frequent Urination?: No Hard to postpone urination?: No Burning/pain with urination?: No Get up at night to urinate?: Yes Leakage of urine?: No Urine stream starts and stops?: No Trouble starting stream?: No Do you have to strain to urinate?: No Blood in urine?: No Urinary tract infection?: No Sexually transmitted disease?: No Injury to kidneys or bladder?: No Painful intercourse?: No Weak stream?: No Erection problems?: No Penile pain?: No  Gastrointestinal Nausea?: No Vomiting?: No Indigestion/heartburn?: No Diarrhea?: No Constipation?: No  Constitutional Fever: No Night sweats?: No Weight loss?: No Fatigue?: No  Skin Skin rash/lesions?: No Itching?: No  Eyes Blurred vision?: No Double vision?: No  Ears/Nose/Throat Sore throat?: No  Sinus problems?: No  Hematologic/Lymphatic Swollen glands?: No Easy bruising?: No  Cardiovascular Leg swelling?: No Chest pain?: No  Respiratory Cough?: No Shortness of breath?: No  Endocrine Excessive thirst?: No  Musculoskeletal Back pain?: No Joint pain?: No  Neurological Headaches?: No Dizziness?: No  Psychologic Depression?: No Anxiety?: No  Physical Exam: BP 134/74   Pulse 60   Ht _0  (1.753 m)   Wt 135 lb (61.2 kg)   BMI 19.94 kg/m   Constitutional:  Alert and oriented, No acute distress. HEENT: [place] AT, moist mucus membranes.  Trachea midline, no masses. Cardiovascular: No clubbing, cyanosis, or edema. Respiratory: Normal respiratory effort, no increased work of  breathing. GI: Abdomen is soft, nontender, nondistended, no abdominal masses GU: No CVA tenderness Lymph: No cervical or inguinal lymphadenopathy. Skin: No rashes, bruises or suspicious lesions. Neurologic: Grossly intact, no focal deficits, moving all 4 extremities. Psychiatric: Normal mood and affect.  Laboratory Data: Lab Results  Component Value Date   WBC 4.3 10/19/2018   HGB 15.6 10/19/2018   HCT 45.5 10/19/2018   MCV 86.8 10/19/2018   PLT 249 10/19/2018    Lab Results  Component Value Date   CREATININE 0.88 10/19/2018    Urinalysis N/a  Pertinent Imaging: n/a  Assessment & Plan:    1. Prostate cancer [facility]) Incidental Gleason 3+3 prostate cancer, PSA has been stable We will continue to follow this annually PSA today, will call with results - PSA - PSA; Future  2. 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  Hollice Espy, MD  [facility] [address], [place], [place]  [address] [phone]

## 2019-04-25 NOTE — Telephone Encounter (Signed)
RX was changed to Sildenafil, patient notified.

## 2019-04-26 ENCOUNTER — Telehealth: Payer: Self-pay | Admitting: Family Medicine

## 2019-04-26 LAB — PSA: Prostate Specific Ag, Serum: 1.3 ng/mL (ref 0.0–4.0)

## 2019-04-26 NOTE — Telephone Encounter (Signed)
-----   Message from Hollice Espy, MD sent at 04/26/2019 11:34 AM EDT ----- PSA is low and stable.    Hollice Espy, MD

## 2019-04-26 NOTE — Telephone Encounter (Signed)
Patient notified

## 2019-05-30 ENCOUNTER — Ambulatory Visit (INDEPENDENT_AMBULATORY_CARE_PROVIDER_SITE_OTHER): Payer: Medicare Other

## 2019-05-30 VITALS — BP 139/81 | HR 57 | Ht 69.0 in | Wt 142.0 lb

## 2019-05-30 DIAGNOSIS — Z0000 Encounter for general adult medical examination without abnormal findings: Secondary | ICD-10-CM

## 2019-05-30 NOTE — Patient Instructions (Signed)
Mr. Austin Hunt , Thank you for taking time to come for your Medicare Wellness Visit. I appreciate your ongoing commitment to your health goals. Please review the following plan we discussed and let me know if I can assist you in the future.   Screening recommendations/referrals: Colonoscopy: Cologuard done 03/06/19  Recommended yearly ophthalmology/optometry visit for glaucoma screening and checkup Recommended yearly dental visit for hygiene and checkup  Vaccinations: Influenza vaccine: done 08/09/18 Pneumococcal vaccine: done 01/22/14 Tdap vaccine: done 11/04/10 Shingles vaccine: Shingrix discussed. Please contact your pharmacy for coverage information.   Advanced directives: Advance directive discussed with you today. I have mailed a copy for you to complete at home and have notarized. Once this is complete please bring a copy in to our office so we can scan it into your chart.  Conditions/risks identified: Keep up the great work!  Next appointment: Please follow up in one year for your Medicare Annual Wellness visit.    Preventive Care 13 Years and Older, Male Preventive care refers to lifestyle choices and visits with your health care provider that can promote health and wellness. What does preventive care include?  A yearly physical exam. This is also called an annual well check.  Dental exams once or twice a year.  Routine eye exams. Ask your health care provider how often you should have your eyes checked.  Personal lifestyle choices, including:  Daily care of your teeth and gums.  Regular physical activity.  Eating a healthy diet.  Avoiding tobacco and drug use.  Limiting alcohol use.  Practicing safe sex.  Taking low doses of aspirin every day.  Taking vitamin and mineral supplements as recommended by your health care provider. What happens during an annual well check? The services and screenings done by your health care provider during your annual well check will depend  on your age, overall health, lifestyle risk factors, and family history of disease. Counseling  Your health care provider may ask you questions about your:  Alcohol use.  Tobacco use.  Drug use.  Emotional well-being.  Home and relationship well-being.  Sexual activity.  Eating habits.  History of falls.  Memory and ability to understand (cognition).  Work and work Statistician. Screening  You may have the following tests or measurements:  Height, weight, and BMI.  Blood pressure.  Lipid and cholesterol levels. These may be checked every 5 years, or more frequently if you are over 84 years old.  Skin check.  Lung cancer screening. You may have this screening every year starting at age 11 if you have a 30-pack-year history of smoking and currently smoke or have quit within the past 15 years.  Fecal occult blood test (FOBT) of the stool. You may have this test every year starting at age 67.  Flexible sigmoidoscopy or colonoscopy. You may have a sigmoidoscopy every 5 years or a colonoscopy every 10 years starting at age 43.  Prostate cancer screening. Recommendations will vary depending on your family history and other risks.  Hepatitis C blood test.  Hepatitis B blood test.  Sexually transmitted disease (STD) testing.  Diabetes screening. This is done by checking your blood sugar (glucose) after you have not eaten for a while (fasting). You may have this done every 1-3 years.  Abdominal aortic aneurysm (AAA) screening. You may need this if you are a current or former smoker.  Osteoporosis. You may be screened starting at age 69 if you are at high risk. Talk with your health care provider about  your test results, treatment options, and if necessary, the need for more tests. Vaccines  Your health care provider may recommend certain vaccines, such as:  Influenza vaccine. This is recommended every year.  Tetanus, diphtheria, and acellular pertussis (Tdap, Td)  vaccine. You may need a Td booster every 10 years.  Zoster vaccine. You may need this after age 108.  Pneumococcal 13-valent conjugate (PCV13) vaccine. One dose is recommended after age 70.  Pneumococcal polysaccharide (PPSV23) vaccine. One dose is recommended after age 16. Talk to your health care provider about which screenings and vaccines you need and how often you need them. This information is not intended to replace advice given to you by your health care provider. Make sure you discuss any questions you have with your health care provider. Document Released: 11/15/2015 Document Revised: 07/08/2016 Document Reviewed: 08/20/2015 Elsevier Interactive Patient Education  2017 [place] Prevention in the Home Falls can cause injuries. They can happen to people of all ages. There are many things you can do to make your home safe and to help prevent falls. What can I do on the outside of my home?  Regularly fix the edges of walkways and driveways and fix any cracks.  Remove anything that might make you trip as you walk through a door, such as a raised step or threshold.  Trim any bushes or trees on the path to your home.  Use bright outdoor lighting.  Clear any walking paths of anything that might make someone trip, such as rocks or tools.  Regularly check to see if handrails are loose or broken. Make sure that both sides of any steps have handrails.  Any raised decks and porches should have guardrails on the edges.  Have any leaves, snow, or ice cleared regularly.  Use sand or salt on walking paths during winter.  Clean up any spills in your garage right away. This includes oil or grease spills. What can I do in the bathroom?  Use night lights.  Install grab bars by the toilet and in the tub and shower. Do not use towel bars as grab bars.  Use non-skid mats or decals in the tub or shower.  If you need to sit down in the shower, use a plastic, non-slip stool.   Keep the floor dry. Clean up any water that spills on the floor as soon as it happens.  Remove soap buildup in the tub or shower regularly.  Attach bath mats securely with double-sided non-slip rug tape.  Do not have throw rugs and other things on the floor that can make you trip. What can I do in the bedroom?  Use night lights.  Make sure that you have a light by your bed that is easy to reach.  Do not use any sheets or blankets that are too big for your bed. They should not hang down onto the floor.  Have a firm chair that has side arms. You can use this for support while you get dressed.  Do not have throw rugs and other things on the floor that can make you trip. What can I do in the kitchen?  Clean up any spills right away.  Avoid walking on wet floors.  Keep items that you use a lot in easy-to-reach places.  If you need to reach something above you, use a strong step stool that has a grab bar.  Keep electrical cords out of the way.  Do not use floor polish or wax  that makes floors slippery. If you must use wax, use non-skid floor wax.  Do not have throw rugs and other things on the floor that can make you trip. What can I do with my stairs?  Do not leave any items on the stairs.  Make sure that there are handrails on both sides of the stairs and use them. Fix handrails that are broken or loose. Make sure that handrails are as long as the stairways.  Check any carpeting to make sure that it is firmly attached to the stairs. Fix any carpet that is loose or worn.  Avoid having throw rugs at the top or bottom of the stairs. If you do have throw rugs, attach them to the floor with carpet tape.  Make sure that you have a light switch at the top of the stairs and the bottom of the stairs. If you do not have them, ask someone to add them for you. What else can I do to help prevent falls?  Wear shoes that:  Do not have high heels.  Have rubber bottoms.  Are  comfortable and fit you well.  Are closed at the toe. Do not wear sandals.  If you use a stepladder:  Make sure that it is fully opened. Do not climb a closed stepladder.  Make sure that both sides of the stepladder are locked into place.  Ask someone to hold it for you, if possible.  Clearly mark and make sure that you can see:  Any grab bars or handrails.  First and last steps.  Where the edge of each step is.  Use tools that help you move around (mobility aids) if they are needed. These include:  Canes.  Walkers.  Scooters.  Crutches.  Turn on the lights when you go into a dark area. Replace any light bulbs as soon as they burn out.  Set up your furniture so you have a clear path. Avoid moving your furniture around.  If any of your floors are uneven, fix them.  If there are any pets around you, be aware of where they are.  Review your medicines with your doctor. Some medicines can make you feel dizzy. This can increase your chance of falling. Ask your doctor what other things that you can do to help prevent falls. This information is not intended to replace advice given to you by your health care provider. Make sure you discuss any questions you have with your health care provider. Document Released: 08/15/2009 Document Revised: 03/26/2016 Document Reviewed: 11/23/2014 Elsevier Interactive Patient Education  2017 [employer].

## 2019-05-30 NOTE — Progress Notes (Signed)
Subjective:   Austin Hunt is a 78 y.o. male who presents for Medicare Annual/Subsequent preventive examination.  Virtual Visit via Telephone Note  I connected with Austin Hunt on 05/30/19 at  8:40 AM EDT by telephone and verified that I am speaking with the correct person using two identifiers.  Medicare Annual Wellness visit completed telephonically due to Covid-19 pandemic.   Location: Patient: home Provider: office   I discussed the limitations, risks, security and privacy concerns of performing an evaluation and management service by telephone and the availability of in person appointments. The patient expressed understanding and agreed to proceed.  Some vital signs may be absent or patient reported.   Clemetine Marker, LPN  Review of Systems:   Cardiac Risk Factors include: advanced age (>65mn, >>65women);diabetes mellitus;dyslipidemia;male gender     Objective:    Vitals: BP 139/81    Pulse (!) 57    Ht 5' 9"  (1.753 m)    Wt 142 lb (64.4 kg)    BMI 20.97 kg/m   Body mass index is 20.97 kg/m.  Advanced Directives 05/30/2019 05/24/2018 05/27/2017 01/04/2017 08/31/2016 02/28/2016 01/22/2016  Does Patient Have a Medical Advance Directive? No Yes;No Yes Yes Yes Yes Yes  Type of Advance Directive - Living will Living will [place]  Does patient want to make changes to medical advance directive? - Yes (MAU/Ambulatory/Procedural Areas - Information given) - - Yes - information given No - Patient declined No - Patient declined  Copy of [place] Chart? - - - No - copy requested No - copy requested No - copy requested No - copy requested  Would patient like information on creating a medical advance directive? Yes (MAU/Ambulatory/Procedural Areas - Information given) - - - - - -    Tobacco Social History   Tobacco Use  Smoking Status  Never Smoker  Smokeless Tobacco Never Used  Tobacco Comment   smoking cessation materials not required     Counseling given: Not Answered Comment: smoking cessation materials not required   Clinical Intake:  Pre-visit preparation completed: Yes  Pain : No/denies pain     BMI - recorded: 20.97 Nutritional Status: BMI of 19-24  Normal Nutritional Risks: None Diabetes: Yes CBG done?: No Did pt. bring in CBG monitor from home?: No   Nutrition Risk Assessment:  Has the patient had any N/V/D within the last 2 months?  No  Does the patient have any non-healing wounds?  No  Has the patient had any unintentional weight loss or weight gain?  No   Diabetes:  Is the patient diabetic?  Yes  If diabetic, was a CBG obtained today?  No  Did the patient bring in their glucometer from home?  No  How often do you monitor your CBG's? Daily in AM - fasting today 115.   Financial Strains and Diabetes Management:  Are you having any financial strains with the device, your supplies or your medication? No .  Does the patient want to be seen by Chronic Care Management for management of their diabetes?  No  Would the patient like to be referred to a Nutritionist or for Diabetic Management?  No   Diabetic Exams:  Diabetic Eye Exam: Completed 09/06/18 negative retinopathy.   Diabetic Foot Exam: Completed 04/25/18. Pt has been advised about the importance in completing this exam. Pt is scheduled for diabetic foot exam on  06/20/19.   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    Diabetes mellitus without complication ([place])    Glaucoma     Headache    rare - 1x/mo   Nocturia    Prostate cancer [facility])    Past Surgical History:  Procedure Laterality Date   [place]  Oct 2015   CARDIAC CATHETERIZATION  2002   1 stent   COLONOSCOPY     EYE SURGERY  2011, 2013   cataracts   EYE SURGERY Right May 2016   glaucoma   HERNIA REPAIR Left 1967   HERNIA REPAIR Right 05-10-14   Right femoral hernia repair Dr Bary Castilla with PerFix plug, no onlay mesh.Marland Kitchen    PHOTOCOAGULATION WITH LASER Right 12/30/2015   Procedure: PHOTOCOAGULATION WITH LASER;  Surgeon: Ronnell Freshwater, MD;  Location: [place];  Service: Ophthalmology;  Laterality: Right;  DIABETIC - oral meds IVA BLOCK   PROSTATE SURGERY  Oct 2015   Family History  Problem Relation Age of Onset   Diabetes Sister    Diabetes Brother    Diabetes Sister    Prostate cancer Neg Hx    Bladder Cancer Neg Hx    Social History   Socioeconomic History   Marital status: Married    Spouse name: Equities trader   Number of children: 4   Years of education: some college   Highest education level: 12th grade  Occupational History    Employer: RETIRED  Scientist, product/process development strain: Not hard at all   Food insecurity    Worry: Never true    Inability: Never true   Transportation needs    Medical: No    Non-medical: No  Tobacco Use   Smoking status: Never Smoker   Smokeless tobacco: Never Used   Tobacco comment: smoking cessation materials not required  Substance and Sexual Activity   Alcohol use: No   Drug use: No   Sexual activity: Yes  Lifestyle   Physical activity    Days per week: 4 days    Minutes per session: 60 min   Stress: Not at all  Relationships   Social connections    Talks on phone: More than three times a week    Gets together: Twice a week    Attends religious service: More than 4 times per year    Active member of club or organization: No    Attends meetings of clubs or  organizations: Never    Relationship status: Married  Other Topics Concern   Not on file  Social History Narrative   Not on file    Outpatient Encounter Medications as of 05/30/2019  Medication Sig   ACCU-CHEK SOFTCLIX LANCETS lancets USE TO TEST TWICE DAILY   aspirin 81 MG tablet Take 81 mg by mouth daily.    blood glucose meter kit and supplies Dispense based on patient and insurance preference. Use up to four times daily as directed. (FOR ICD-10 E10.9, E11.9).   brimonidine-timolol (COMBIGAN) 0.2-0.5 % ophthalmic solution Place 1 drop into both eyes every 12 (  twelve) hours.   dorzolamide (TRUSOPT) 2 % ophthalmic solution    ezetimibe (ZETIA) 10 MG tablet Take 1 tablet (10 mg total) by mouth daily.   Garlic 10 MG CAPS Take 1 capsule by mouth daily.    glucose blood (ACCU-CHEK ACTIVE STRIPS) test strip Use as instructed   hydrocortisone 2.5 % cream    latanoprost (XALATAN) 0.005 % ophthalmic solution    metFORMIN (GLUCOPHAGE-XR) 500 MG 24 hr tablet TAKE 2 TABLETS BY MOUTH ONCE EVERY MORNING AND 1 TABLET AT BEDTIME   Multiple Vitamin (MULTIVITAMIN) tablet Take 1 tablet by mouth daily.   OMEGA-3 FATTY ACIDS PO Take 1 tablet by mouth daily.   sildenafil (VIAGRA) 100 MG tablet Take 1 tablet (100 mg total) by mouth daily as needed for erectile dysfunction.   travoprost, benzalkonium, (TRAVATAN) 0.004 % ophthalmic solution 1 drop at bedtime.   vitamin C (ASCORBIC ACID) 500 MG tablet Take 500 mg by mouth daily.    Zinc Acetate (GALZIN) 50 MG CAPS Take 1 capsule by mouth 3 (three) times a week.   glipiZIDE (GLUCOTROL) 5 MG tablet Take 1 tablet by mouth daily.   isosorbide mononitrate (IMDUR) 30 MG 24 hr tablet Take by mouth.   [DISCONTINUED] diclofenac sodium (VOLTAREN) 1 % GEL Apply 2 g topically 4 (four) times daily.   [DISCONTINUED] lubiprostone (AMITIZA) 8 MCG capsule Take 1 capsule (8 mcg total) by mouth 2 (two) times daily with a meal.   No facility-administered  encounter medications on file as of 05/30/2019.     Activities of Daily Living In your present state of health, do you have any difficulty performing the following activities: 05/30/2019 02/21/2019  Hearing? N N  Comment declines hearing aids -  Vision? N N  Comment reading glasses -  Difficulty concentrating or making decisions? N N  Walking or climbing stairs? N N  Dressing or bathing? N N  Doing errands, shopping? N N  Preparing Food and eating ? N -  Using the Toilet? N -  In the past six months, have you accidently leaked urine? N -  Do you have problems with loss of bowel control? N -  Managing your Medications? N -  Managing your Finances? N -  Housekeeping or managing your Housekeeping? N -  Some recent data might be hidden    Patient Care Team: [name], MD as PCP - General (Family Medicine) Byrnett, Forest Gleason, MD (General Surgery) Yolonda Kida, MD as Consulting Physician (Cardiology) Anabel Bene, MD as Referring Physician (Neurology) Hollice Espy, MD as Consulting Physician (Urology) Leandrew Koyanagi, MD as Consulting Physician (Ophthalmology) Gardiner Barefoot, DPM as Consulting Physician (Podiatry) Beverly Gust, MD (Otolaryngology)   Assessment:   This is a routine wellness examination for Austin Hunt.  Exercise Activities and Dietary recommendations Current Exercise Habits: Home exercise routine, Type of exercise: walking, Time (Minutes): 60, Frequency (Times/Week): 4, Weekly Exercise (Minutes/Week): 240, Intensity: Moderate, Exercise limited by: None identified  Goals     DIET - INCREASE WATER INTAKE     Recommend to drink at least 6-8 8oz glasses of water per day.     Exercise     Starting 08/31/16, I will continue exercising 4 times a week for 40 minutes.     I need a constipation medicine that doesn't use animal products (pt-stated)     Current Barriers:   Chronic constipation  Long time vegetarian, avoids animal products such  as gelatin used in many OTCs   Uses prune juice for  constipation, but this gives him diarrhea  Pharmacist Clinical Goal(s):   Over the next 30 days, patient and clinical pharmacist will work together to find a suitable treatment for chronic constipation that meet's patients lifestyle.   Interventions:  Identify alternative therapies for chronic constipation that are compatible with patient's vegetarian lifestyle and beliefs.   Patient Self Care Activities:   Calls pharmacy for medication refills  Calls provider office for new concerns or questions  Initial goal documentation      My eye drops are expensive (pt-stated)     Current Barriers:   financial  Pharmacist Clinical Goal(s): Over the next 30 days, Austin Hunt will provide the necessary supplementary documents (proof of out of pocket prescription expenditure, proof of household income) needed for medication assistance applications to CCM pharmacist pending available programs or foundations.   Interventions:  CCM pharmacist will research for medication assistance program for Combigan, Trusopt, and Xalatan (prescriber: Dr. Leandrew [facility]agi at [facility])  Patient Self Care Activities:   Gather necessary documents needed to apply for medication assistance  Initial goal documentation         Fall Risk Fall Risk  05/30/2019 02/21/2019 10/19/2018 05/24/2018 02/07/2018  Falls in the past year? 0 0 0 No No  Number falls in past yr: 0 0 0 - -  Injury with Fall? 0 0 0 - -  Risk for fall due to : - - - Impaired vision;History of fall(s) -  Risk for fall due to: Comment - - - wears eyeglasses; h/o tripping -  Follow up Falls prevention discussed - - - -   FALL RISK PREVENTION PERTAINING TO THE HOME:  Any stairs in or around the home? Yes  If so, do they handrails? Yes   Home free of loose throw rugs in walkways, pet beds, electrical cords, etc? Yes  Adequate lighting in your home to reduce risk of falls? Yes    ASSISTIVE DEVICES UTILIZED TO PREVENT FALLS:  Life alert? No  Use of a cane, walker or w/c? No  Grab bars in the bathroom? Yes  Shower chair or bench in shower? Yes  Elevated toilet seat or a handicapped toilet? No   DME ORDERS:  DME order needed?  No   TIMED UP AND GO:  Was the test performed? No . Telephonic visit.   Education: Fall risk prevention has been discussed.  Intervention(s) required? No    Depression Screen PHQ 2/9 Scores 05/30/2019 02/21/2019 05/24/2018 05/27/2017  PHQ - 2 Score 0 0 0 0  PHQ- 9 Score - 0 0 -    Cognitive Function     6CIT Screen 05/30/2019 05/24/2018 08/31/2016  What Year? 0 points 0 points 0 points  What month? 0 points 0 points 0 points  What time? 0 points 0 points 0 points  Count back from 20 0 points 0 points 0 points  Months in reverse 0 points 0 points 0 points  Repeat phrase 0 points 6 points 0 points  Total Score 0 6 0    Immunization History  Administered Date(s) Administered   Influenza Split 07/02/2010   Influenza, High Dose Seasonal PF 08/14/2015, 07/30/2016, 08/19/2017, 08/09/2018   Influenza, Seasonal, Injecte, Preservative Fre 07/08/2011, 07/22/2012   Influenza,inj,Quad PF,6+ Mos 07/11/2013   Pneumococcal Conjugate-13 01/22/2014   Pneumococcal-Unspecified 01/03/2008   Tdap 11/04/2010   Zoster 06/14/2008    Qualifies for Shingles Vaccine? Yes  Zostavax completed 2009. Due for Shingrix. Education has been provided regarding the importance of this vaccine.  Pt has been advised to call insurance company to determine out of pocket expense. Advised may also receive vaccine at local pharmacy or Health Dept. Verbalized acceptance and understanding.  Tdap: Up to date  Flu Vaccine: Up to date  Pneumococcal Vaccine: Up to date   Screening Tests Health Maintenance  Topic Date Due   HEMOGLOBIN A1C  04/20/2019   FOOT EXAM  04/26/2019   URINE MICROALBUMIN  05/25/2019   INFLUENZA VACCINE  06/03/2019    OPHTHALMOLOGY EXAM  09/07/2019   TETANUS/TDAP  11/04/2020   PNA vac Low Risk Adult  Completed   Cancer Screenings:  Colorectal Screening: Cologuard completed 03/06/19. Repeat every 3 years.  Lung Cancer Screening: (Low Dose CT Chest recommended if Age 80-80 years, 30 pack-year currently smoking OR have quit w/in 15years.) does not qualify.   Additional Screening:  Hepatitis C Screening: no longer required.   Vision Screening: Recommended annual ophthalmology exams for early detection of glaucoma and other disorders of the eye. Is the patient up to date with their annual eye exam?  Yes  Who is the provider or what is the name of the office in which the pt attends annual eye exams? [place] Screening: Recommended annual dental exams for proper oral hygiene  Community Resource Referral:  CRR required this visit?  No        Plan:    I have personally reviewed and addressed the Medicare Annual Wellness questionnaire and have noted the following in the patients chart:  A. Medical and social history B. Use of alcohol, tobacco or illicit drugs  C. Current medications and supplements D. Functional ability and status E.  Nutritional status F.  Physical activity G. Advance directives H. List of other physicians I.  Hospitalizations, surgeries, and ER visits in previous 12 months J.  [place] such as hearing and vision if needed, cognitive and depression L. Referrals and appointments   In addition, I have reviewed and discussed with patient certain preventive protocols, quality metrics, and best practice recommendations. A written personalized care plan for preventive services as well as general preventive health recommendations were provided to patient.   Signed,  Clemetine Marker, LPN Nurse Health Advisor   Nurse Notes: pt doing well and appreciative of visit today.

## 2019-06-01 ENCOUNTER — Other Ambulatory Visit: Payer: Self-pay | Admitting: Podiatry

## 2019-06-01 NOTE — Telephone Encounter (Signed)
Called and informed patient that a prescription for the Diclofenac Sod gel 1% was sent to pharmacy on file per Dr. Prudence Davidson. Patient understood and said ok.

## 2019-06-06 ENCOUNTER — Telehealth: Payer: Self-pay

## 2019-06-06 NOTE — Telephone Encounter (Signed)
Copied from [place] [id number]. Topic: General - Inquiry >> Jun 02, 2019  3:22 PM Richardo Priest, NT wrote: Reason for CRM: Patient called in stating he is needing to speak with PCP CMA/RN in regards to cologuard test being filed. States he already spoke with someone in billing and they advised him to call office. Please advise. Call back is [phone].

## 2019-06-07 NOTE — Telephone Encounter (Signed)
Yes this is the patient. I did call cologuard and they did not have his correct medicare number so they updated it and will be reprocessing the claim. I called and left him a message with that information and told him to contact us if he had any more questions. Thank you!

## 2019-06-08 ENCOUNTER — Ambulatory Visit: Payer: Medicare Other | Admitting: Podiatry

## 2019-06-20 ENCOUNTER — Ambulatory Visit (INDEPENDENT_AMBULATORY_CARE_PROVIDER_SITE_OTHER): Payer: Medicare Other | Admitting: Family Medicine

## 2019-06-20 ENCOUNTER — Encounter: Payer: Self-pay | Admitting: Family Medicine

## 2019-06-20 ENCOUNTER — Other Ambulatory Visit: Payer: Self-pay

## 2019-06-20 VITALS — BP 118/70 | HR 64 | Temp 97.3°F | Resp 16 | Ht 69.0 in | Wt 133.5 lb

## 2019-06-20 DIAGNOSIS — E441 Mild protein-calorie malnutrition: Secondary | ICD-10-CM

## 2019-06-20 DIAGNOSIS — I10 Essential (primary) hypertension: Secondary | ICD-10-CM

## 2019-06-20 DIAGNOSIS — E1139 Type 2 diabetes mellitus with other diabetic ophthalmic complication: Secondary | ICD-10-CM | POA: Diagnosis not present

## 2019-06-20 DIAGNOSIS — D708 Other neutropenia: Secondary | ICD-10-CM

## 2019-06-20 DIAGNOSIS — N521 Erectile dysfunction due to diseases classified elsewhere: Secondary | ICD-10-CM | POA: Diagnosis not present

## 2019-06-20 DIAGNOSIS — E538 Deficiency of other specified B group vitamins: Secondary | ICD-10-CM | POA: Diagnosis not present

## 2019-06-20 DIAGNOSIS — E559 Vitamin D deficiency, unspecified: Secondary | ICD-10-CM

## 2019-06-20 DIAGNOSIS — E1140 Type 2 diabetes mellitus with diabetic neuropathy, unspecified: Secondary | ICD-10-CM | POA: Diagnosis not present

## 2019-06-20 DIAGNOSIS — E785 Hyperlipidemia, unspecified: Secondary | ICD-10-CM

## 2019-06-20 DIAGNOSIS — H42 Glaucoma in diseases classified elsewhere: Secondary | ICD-10-CM | POA: Diagnosis not present

## 2019-06-20 LAB — POCT GLYCOSYLATED HEMOGLOBIN (HGB A1C): Hemoglobin A1C: 6.9 % — AB (ref 4.0–5.6)

## 2019-06-20 MED ORDER — B-12 1000 MCG SL SUBL: 1.0000 | SUBLINGUAL_TABLET | Freq: Every day | SUBLINGUAL | 0 refills | Status: AC

## 2019-06-20 MED ORDER — VITAMIN D 50 MCG (2000 UT) PO CAPS: 1.0000 | ORAL_CAPSULE | Freq: Every day | ORAL | 0 refills | Status: AC

## 2019-06-20 MED ORDER — EZETIMIBE 10 MG PO TABS: 10.0000 mg | ORAL_TABLET | Freq: Every day | ORAL | 3 refills | Status: DC

## 2019-06-20 MED ORDER — METFORMIN HCL ER 500 MG PO TB24: ORAL_TABLET | ORAL | 1 refills | Status: DC

## 2019-06-20 NOTE — Progress Notes (Signed)
Name: Austin Hunt   MRN: [id number]    DOB: 27-Dec-1940   Date:06/20/2019       Progress Note  Subjective  Chief Complaint  Chief Complaint  Patient presents with  . Hyperlipidemia    wants to discuss Zetia  . Leg Pain    left leg pain x 2 months. Pain comes and goes and aches.  . Hand Pain    left pointer finger pain started last night.    HPI  DMII: glucose at homeis around 100-120's he has not been skipping meals.  HbgA1C last A1C was 7.2% and today it was 6.9% He denies polyphagia, polydipsia or polyuria. Vision is better since he had glaucoma and cataract surgery.He also has ED and sees Urologist and has rx of Viagra at home.Nocturia usually once per night.He has been taking Metformin but off Glipizide for over 6 months.   Leukopenia: WBC was low ,down to 3.1 but last checked 10/2018 and back to normal , we will recheck it today   BPH and history of prostate cancer: he is seeing Urologist and denies any obstructive symptoms, except for nocturia times one at most.he is on yearly follow up now.Last PSA was 1.3   HTN: patient is off medication, no chest pain or palpitation, he states bp at home has been around 125/78.   Dyslipidemia: he is not on statin therapy, he refuses medication - myalgia in the past, he was  taking Zetia now and last LDL went from 181 to 120 , HDL at goal at 51, triglycerides, we will recheck level today   Chronic constipation: taking Amitizaprn because it causes diarrhea, he is on lower dose of Amitiza and stopped taking it because it was a capsule and cannot tolerate pork. He has increased water intake , he drinks prune juice prn   He had a stress test done by Dr. Clayborn Bigness for evaluation of chest pain back in 11/2018 and results were:   Normal myocardial perfusion scan no evidence of stress-induced  myocardial ischemia ejection fraction of 64% conclusion negative scan.  Low risk scan  Malnutrition: temporal waiting, lost almost 10  lbs since Dec, he has been taking Glucerna to help keep his weight up. Hi weight about 10 years ago was around 155 lbs. At one point in his life he was 200 lbs. We will monitor for now and get labs    Patient Active Problem List   Diagnosis Date Noted  . Prostate cancer ([place]) 02/07/2018  . Cervical pain (neck) 10/13/2016  . 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 Long term current use of systemic steroids 01/07/2016  . Neutropenia ([place]) 01/07/2016  . Anxiety 04/08/2015  . 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 Chronic constipation 04/08/2015  . 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 Failure of erection 04/08/2015  . H/O inguinal hernia repair 04/08/2015  . HLD (hyperlipidemia) 04/08/2015  . 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 LBP (low back pain) 04/08/2015  . 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 Drug intolerance 04/08/2015  . Kidney lump 04/08/2015  . TI (tricuspid incompetence) 04/08/2015  . Unilateral recurrent femoral hernia without obstruction or gangrene 05/23/2014  . Femoral hernia 05/23/2014  . Right inguinal hernia 02/06/2014  . Cataract 01/16/2014  . Glaucoma 01/16/2014  . Reflux esophagitis 06/21/2009  . Coronary atherosclerosis 04/03/2009  . Benign enlargement of prostate 02/11/2009  . Decreased libido 01/12/2008  . Benign essential HTN 09/12/2007    Past Surgical History:  Procedure Laterality Date  .  APPENDECTOMY  1954  . BLADDER SURGERY  Oct 2015  . CARDIAC CATHETERIZATION  2002   1 stent  . COLONOSCOPY    . EYE SURGERY  2011, 2013   cataracts  . EYE SURGERY Right May 2016   glaucoma  . HERNIA REPAIR Left 1967  . HERNIA REPAIR Right 05-10-14   Right femoral hernia repair Dr Bary Castilla with PerFix plug, no onlay mesh..   . PHOTOCOAGULATION WITH LASER Right 12/30/2015   Procedure: PHOTOCOAGULATION WITH LASER;  Surgeon: Ronnell Freshwater, MD;  Location: [facility];  Service: Ophthalmology;  Laterality: Right;   DIABETIC - oral meds IVA BLOCK  . PROSTATE SURGERY  Oct 2015    Family History  Problem Relation Age of Onset  . Diabetes Sister   . Diabetes Brother   . Diabetes Sister   . Prostate cancer Neg Hx   . Bladder Cancer Neg Hx     Social History   Socioeconomic History  . Marital status: Married    Spouse name: Onalee Hua  . Number of children: 4  . Years of education: some college  . Highest education level: 12th grade  Occupational History    Employer: RETIRED  Social Needs  . Financial resource strain: Not hard at all  . Food insecurity    Worry: Never true    Inability: Never true  . Transportation needs    Medical: No    Non-medical: No  Tobacco Use  . Smoking status: Never Smoker  . Smokeless tobacco: Never Used  . Tobacco comment: smoking cessation materials not required  Substance and Sexual Activity  . Alcohol use: No  . Drug use: No  . Sexual activity: Yes  Lifestyle  . Physical activity    Days per week: 4 days    Minutes per session: 60 min  . Stress: Not at all  Relationships  . Social connections    Talks on phone: More than three times a week    Gets together: Twice a week    Attends religious service: More than 4 times per year    Active member of club or organization: No    Attends meetings of clubs or organizations: Never    Relationship status: Married  . Intimate partner violence    Fear of current or ex partner: No    Emotionally abused: No    Physically abused: No    Forced sexual activity: No  Other Topics Concern  . Not on file  Social History Narrative  . Not on file     Current Outpatient Medications:  .  ACCU-CHEK SOFTCLIX LANCETS lancets, USE TO TEST TWICE DAILY, Disp: 100 each, Rfl: 5 .  aspirin 81 MG tablet, Take 81 mg by mouth daily. , Disp: , Rfl:  .  blood glucose meter kit and supplies, Dispense based on patient and insurance preference. Use up to four times daily as directed. (FOR ICD-10 E10.9, E11.9)., Disp: 1 each, Rfl:  0 .  brimonidine-timolol (COMBIGAN) 0.2-0.5 % ophthalmic solution, Place 1 drop into both eyes every 12 (twelve) hours., Disp: , Rfl:  .  Cholecalciferol (VITAMIN D) 50 MCG (2000 UT) CAPS, Take 1 capsule (2,000 Units total) by mouth daily., Disp: 30 capsule, Rfl: 0 .  Cyanocobalamin (B-12) 1000 MCG SUBL, Place 1 tablet under the tongue daily., Disp: 30 tablet, Rfl: 0 .  diclofenac sodium (VOLTAREN) 1 % GEL, APPLY 2 GRAMS 4 TIMES DAILY, Disp: 100 g, Rfl: 2 .  dorzolamide (TRUSOPT) 2 % ophthalmic  solution, , Disp: , Rfl:  .  ezetimibe (ZETIA) 10 MG tablet, Take 1 tablet (10 mg total) by mouth daily., Disp: 90 tablet, Rfl: 3 .  Garlic 10 MG CAPS, Take 1 capsule by mouth daily. , Disp: , Rfl:  .  glucose blood (ACCU-CHEK ACTIVE STRIPS) test strip, Use as instructed, Disp: 100 each, Rfl: 12 .  hydrocortisone 2.5 % cream, , Disp: , Rfl:  .  isosorbide mononitrate (IMDUR) 30 MG 24 hr tablet, Take by mouth., Disp: , Rfl:  .  latanoprost (XALATAN) 0.005 % ophthalmic solution, , Disp: , Rfl:  .  metFORMIN (GLUCOPHAGE-XR) 500 MG 24 hr tablet, TAKE 2 TABLETS BY MOUTH ONCE EVERY MORNING AND 1 TABLET AT BEDTIME, Disp: 270 tablet, Rfl: 1 .  Multiple Vitamin (MULTIVITAMIN) tablet, Take 1 tablet by mouth daily., Disp: , Rfl:  .  OMEGA-3 FATTY ACIDS PO, Take 1 tablet by mouth daily., Disp: , Rfl:  .  sildenafil (VIAGRA) 100 MG tablet, Take 1 tablet (100 mg total) by mouth daily as needed for erectile dysfunction., Disp: 6 tablet, Rfl: 6 .  travoprost, benzalkonium, (TRAVATAN) 0.004 % ophthalmic solution, 1 drop at bedtime., Disp: , Rfl:  .  vitamin C (ASCORBIC ACID) 500 MG tablet, Take 500 mg by mouth daily. , Disp: , Rfl:  .  Zinc Acetate (GALZIN) 50 MG CAPS, Take 1 capsule by mouth 3 (three) times a week., Disp: , Rfl:   Allergies  Allergen Reactions  . Colesevelam Other (See Comments)  . Penicillins Itching and Other (See Comments)  . Statins Other (See Comments)    myalgia  . Welchol [Colesevelam Hcl]      I personally reviewed active problem list, medication list, allergies, family history, social history with the patient/caregiver today.   ROS  Constitutional: Negative for fever positive for  weight change - loss  Respiratory: Negative for cough and shortness of breath.   Cardiovascular: Negative for chest pain or palpitations.  Gastrointestinal: Negative for abdominal pain, no bowel changes.  Musculoskeletal: Negative for gait problem or joint swelling.  Skin: Negative for rash.  Neurological: Negative for dizziness or headache.  No other specific complaints in a complete review of systems (except as listed in HPI above).  Objective  Vitals:   06/20/19 0832  BP: 118/70  Pulse: 64  Resp: 16  Temp: (!) 97.3 F (36.3 C)  TempSrc: Temporal  SpO2: 97%  Weight: 133 lb 8 oz (60.6 kg)  Height: _0  (1.753 m)    Body mass index is 19.71 kg/m.  Physical Exam  Constitutional: Patient appears well-developed and very thin, temporal waisting No distress.  HEENT: head atraumatic, normocephalic, pupils equal and reactive to light, neck supple Cardiovascular: Normal rate, regular rhythm and normal heart sounds.  No murmur heard. No BLE edema. Pulmonary/Chest: Effort normal and breath sounds normal. No respiratory distress. Abdominal: Soft.  There is no tenderness. Psychiatric: Patient has a normal mood and affect. behavior is normal. Judgment and thought content normal.   Recent Results (from the past 2160 hour(s))  PSA     Status: None   Collection Time: 04/25/19 10:20 AM  Result Value Ref Range   Prostate Specific Ag, Serum 1.3 0.0 - 4.0 ng/mL    Comment: Roche ECLIA methodology. According to the American Urological Association, Serum PSA should decrease and remain at undetectable levels after radical prostatectomy. The AUA defines biochemical recurrence as an initial PSA value 0.2 ng/mL or greater followed by a subsequent confirmatory PSA value 0.2 ng/mL  or  greater. Values obtained with different assay methods or kits cannot be used interchangeably. Results cannot be interpreted as absolute evidence of the presence or absence of malignant disease.   POCT glycosylated hemoglobin (Hb A1C)     Status: Abnormal   Collection Time: 06/20/19  9:13 AM  Result Value Ref Range   Hemoglobin A1C 6.9 (A) 4.0 - 5.6 %   HbA1c POC (<> result, manual entry)     HbA1c, POC (prediabetic range)     HbA1c, POC (controlled diabetic range)      Diabetic Foot Exam: Diabetic Foot Exam - Simple   Simple Foot Form Diabetic Foot exam was performed with the following findings: Yes 06/20/2019  8:55 AM  Visual Inspection No deformities, no ulcerations, no other skin breakdown bilaterally: Yes Sensation Testing Intact to touch and monofilament testing bilaterally: Yes Pulse Check See comments: Yes Comments Mild decrease on dorsalis pulse, but he can walk at least 1 hour without pain on his legs, discussed referral to vascular surgeon       PHQ2/9: Depression screen [facility] 2/9 06/20/2019 05/30/2019 02/21/2019 05/24/2018 05/27/2017  Decreased Interest 0 0 0 0 0  Down, Depressed, Hopeless 0 0 0 0 0  PHQ - 2 Score 0 0 0 0 0  Altered sleeping 0 - 0 0 -  Tired, decreased energy 0 - 0 0 -  Change in appetite 0 - 0 0 -  Feeling bad or failure about yourself  0 - 0 0 -  Trouble concentrating 0 - 0 0 -  Moving slowly or fidgety/restless 0 - 0 0 -  Suicidal thoughts 0 - 0 0 -  PHQ-9 Score 0 - 0 0 -  Difficult doing work/chores - - - Not difficult at all -    phq 9 is negative    Fall Risk: Fall Risk  06/20/2019 05/30/2019 02/21/2019 10/19/2018 05/24/2018  Falls in the past year? 0 0 0 0 No  Number falls in past yr: 0 0 0 0 -  Injury with Fall? 0 0 0 0 -  Risk for fall due to : - - - - Impaired vision;History of fall(s)  Risk for fall due to: Comment - - - - wears eyeglasses; h/o tripping  Follow up - Falls prevention discussed - - -     Functional Status Survey: Is  the patient deaf or have difficulty hearing?: No Does the patient have difficulty seeing, even when wearing glasses/contacts?: No Does the patient have difficulty concentrating, remembering, or making decisions?: No Does the patient have difficulty walking or climbing stairs?: No Does the patient have difficulty dressing or bathing?: No Does the patient have difficulty doing errands alone such as visiting a doctor's office or shopping?: No    Assessment & Plan  1. Other neutropenia ([place])  - CBC with Differential/Platelet  2. 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 Essential hypertension  - COMPLETE METABOLIC PANEL WITH GFR  4. Dyslipidemia  - Lipid panel - ezetimibe (ZETIA) 10 MG tablet; Take 1 tablet (10 mg total) by mouth daily.  Dispense: 90 tablet; Refill: 3  5. B12 deficiency  He will resume supplementation   6. Vitamin D deficiency  Continue supplementation, we will not check level because  of cost  7. Mild protein-calorie malnutrition (HCC)  Continue Glucerna  8. 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

## 2019-06-21 LAB — COMPLETE METABOLIC PANEL WITH GFR
AG Ratio: 1.7 (calc) (ref 1.0–2.5)
ALT: 12 U/L (ref 9–46)
AST: 19 U/L (ref 10–35)
Albumin: 4.5 g/dL (ref 3.6–5.1)
Alkaline phosphatase (APISO): 71 U/L (ref 35–144)
BUN: 16 mg/dL (ref 7–25)
CO2: 30 mmol/L (ref 20–32)
Calcium: 10.1 mg/dL (ref 8.6–10.3)
Chloride: 100 mmol/L (ref 98–110)
Creat: 0.89 mg/dL (ref 0.70–1.11)
GFR, Est African American: 94 mL/min/{1.73_m2} (ref >=60)
GFR, Est Non African American: 81 mL/min/{1.73_m2} (ref >=60)
Globulin: 2.6 g/dL (calc) (ref 1.9–3.7)
Glucose, Bld: 134 mg/dL — ABNORMAL HIGH (ref 65–99)
Potassium: 4.5 mmol/L (ref 3.5–5.3)
Sodium: 140 mmol/L (ref 135–146)
Total Bilirubin: 0.5 mg/dL (ref 0.2–1.2)
Total Protein: 7.1 g/dL (ref 6.1–8.1)

## 2019-06-21 LAB — MICROALBUMIN / CREATININE URINE RATIO
Creatinine, Urine: 68 mg/dL (ref 20–320)
Microalb Creat Ratio: 3 mcg/mg creat (ref <30)
Microalb, Ur: 0.2 mg/dL

## 2019-06-21 LAB — CBC WITH DIFFERENTIAL/PLATELET
Absolute Monocytes: 252 cells/uL (ref 200–950)
Basophils Absolute: 9 cells/uL (ref 0–200)
Basophils Relative: 0.3 %
Eosinophils Absolute: 122 cells/uL (ref 15–500)
Eosinophils Relative: 4.2 %
HCT: 44.7 % (ref 38.5–50.0)
Hemoglobin: 14.8 g/dL (ref 13.2–17.1)
Lymphs Abs: 1264 cells/uL (ref 850–3900)
MCH: 29 pg (ref 27.0–33.0)
MCHC: 33.1 g/dL (ref 32.0–36.0)
MCV: 87.5 fL (ref 80.0–100.0)
MPV: 9.6 fL (ref 7.5–12.5)
Monocytes Relative: 8.7 %
Neutro Abs: 1253 cells/uL — ABNORMAL LOW (ref 1500–7800)
Neutrophils Relative %: 43.2 %
Platelets: 225 10*3/uL (ref 140–400)
RBC: 5.11 10*6/uL (ref 4.20–5.80)
RDW: 12 % (ref 11.0–15.0)
Total Lymphocyte: 43.6 %
WBC: 2.9 10*3/uL — ABNORMAL LOW (ref 3.8–10.8)

## 2019-06-21 LAB — LIPID PANEL
Cholesterol: 206 mg/dL — ABNORMAL HIGH (ref <200)
HDL: 52 mg/dL (ref >=40)
LDL Cholesterol (Calc): 135 mg/dL (calc) — ABNORMAL HIGH
Non-HDL Cholesterol (Calc): 154 mg/dL (calc) — ABNORMAL HIGH (ref <130)
Total CHOL/HDL Ratio: 4 (calc) (ref <5.0)
Triglycerides: 89 mg/dL (ref <150)

## 2019-07-13 ENCOUNTER — Encounter: Payer: Self-pay | Admitting: Podiatry

## 2019-07-13 ENCOUNTER — Telehealth: Payer: Self-pay | Admitting: *Deleted

## 2019-07-13 ENCOUNTER — Ambulatory Visit (INDEPENDENT_AMBULATORY_CARE_PROVIDER_SITE_OTHER): Payer: Medicare Other | Admitting: Podiatry

## 2019-07-13 ENCOUNTER — Other Ambulatory Visit: Payer: Self-pay

## 2019-07-13 DIAGNOSIS — E119 Type 2 diabetes mellitus without complications: Secondary | ICD-10-CM

## 2019-07-13 DIAGNOSIS — B351 Tinea unguium: Secondary | ICD-10-CM | POA: Diagnosis not present

## 2019-07-13 DIAGNOSIS — M79676 Pain in unspecified toe(s): Secondary | ICD-10-CM

## 2019-07-13 DIAGNOSIS — M2010 Hallux valgus (acquired), unspecified foot: Secondary | ICD-10-CM | POA: Diagnosis not present

## 2019-07-13 MED ORDER — DICLOFENAC SODIUM 1 % TD GEL: 2.0000 g | Freq: Four times a day (QID) | TRANSDERMAL | 0 refills | Status: DC

## 2019-07-13 NOTE — Progress Notes (Signed)
Complaint:  Visit Type: Patient returns to my office for continued preventative foot care services. Complaint: Patient states" my nails have grown long and thick and become painful to walk and wear shoes" Patient has been diagnosed with DM with neuropathy. The patient presents for preventative foot care services. No changes to ROS  Podiatric Exam: Vascular: dorsalis pedis and posterior tibial pulses are palpable bilateral. Capillary return is immediate. Temperature gradient is WNL. Skin turgor WNL  Sensorium: Normal Semmes Weinstein monofilament test. Normal tactile sensation bilaterally. Nail Exam: Pt has thick disfigured discolored nails with subungual debris noted bilateral entire nail hallux through fifth toenails Ulcer Exam: There is no evidence of ulcer or pre-ulcerative changes or infection. Orthopedic Exam: Muscle tone and strength are WNL. No limitations in general ROM. No crepitus or effusions noted. Foot type and digits show no abnormalities.  HAV  B/L. Bony prominence at fifth metabase. Skin: No Porokeratosis. No infection or ulcers  Diagnosis:  Onychomycosis, , Pain in right toe, pain in left toes,  Diabetes with neuropathy  HAV  B/L  Treatment & Plan Procedures and Treatment: Consent by patient was obtained for treatment procedures. The patient understood the discussion of treatment and procedures well. All questions were answered thoroughly reviewed. Debridement of mycotic and hypertrophic toenails, 1 through 5 bilateral and clearing of subungual debris. No ulceration, no infection noted.  Prescribe voltaren gel  1 %.      Return Visit-Office Procedure: Patient instructed to return to the office for a follow up visit 4 months for continued evaluation and treatment.    Gardiner Barefoot DPM

## 2019-07-13 NOTE — Telephone Encounter (Signed)
Patient was given handwritten prescription per Dr. Prudence Davidson Voltaren 1% gel today, No refills.

## 2019-07-20 DIAGNOSIS — H401133 Primary open-angle glaucoma, bilateral, severe stage: Secondary | ICD-10-CM | POA: Diagnosis not present

## 2019-07-20 LAB — HM DIABETES EYE EXAM

## 2019-07-24 ENCOUNTER — Encounter: Payer: Self-pay | Admitting: Family Medicine

## 2019-08-01 ENCOUNTER — Ambulatory Visit (INDEPENDENT_AMBULATORY_CARE_PROVIDER_SITE_OTHER): Payer: Medicare Other

## 2019-08-01 ENCOUNTER — Other Ambulatory Visit: Payer: Self-pay

## 2019-08-01 DIAGNOSIS — Z23 Encounter for immunization: Secondary | ICD-10-CM | POA: Diagnosis not present

## 2019-08-16 ENCOUNTER — Other Ambulatory Visit: Payer: Self-pay

## 2019-08-16 DIAGNOSIS — Z20822 Contact with and (suspected) exposure to covid-19: Secondary | ICD-10-CM

## 2019-08-17 ENCOUNTER — Ambulatory Visit: Payer: Medicare Other

## 2019-08-17 LAB — NOVEL CORONAVIRUS, NAA: SARS-CoV-2, NAA: NOT DETECTED

## 2019-09-08 ENCOUNTER — Ambulatory Visit: Payer: Self-pay | Admitting: *Deleted

## 2019-09-08 NOTE — Telephone Encounter (Signed)
  Reason for Disposition . [1] Fatigue (i.e., tires easily, decreased energy) AND [2] persists > 1 week  Answer Assessment - Initial Assessment Questions 1. DESCRIPTION: "Describe how you are feeling."     Not having usual amount of energy 2. SEVERITY: "How bad is it?"  "Can you stand and walk?"   - MILD - Feels weak or tired, but does not interfere with work, school or normal activities   - [place] to stand and walk; weakness interferes with work, school, or normal activities   - SEVERE - Unable to stand or walk    mild 3. ONSET:  "When did the weakness begin?"     3 months 4. CAUSE: "What do you think is causing the weakness?"   weight 5. MEDICINES: "Have you recently started a new medicine or had a change in the amount of a medicine?"   no 6. OTHER SYMPTOMS: "Do you have any other symptoms?" (e.g., chest pain, fever, cough, SOB, vomiting, diarrhea, bleeding, other areas of pain)     Weight loss; 126 lbs 09/08/2019 7. PREGNANCY: "Is there any chance you are pregnant?" "When was your last menstrual period?"     n/a  Protocols used: WEAKNESS (GENERALIZED) AND FATIGUE-A-AH

## 2019-09-08 NOTE — Telephone Encounter (Addendum)
Called pt due to concerns of weight loss; he takes Metformin; this morning he wiighted and 126 lbs; the pt says he is eating normally, and nothing has changed in his diet; his morning blood sugars have been in the 120s; it was 111; he says he walks at least 20 min three times weekly; the pt says he drinks 2-8 oz Glucerna daily; the pt says for the past few weeks he has been having weakness/not having his usual amount of energy; recommendations made per nurse triage protocol; he verbalized understanding; the pt sees Dr Ancil Boozer, [place]; spoke with Lenna Sciara; she requested that the pt be scheduled with Raelyn Ensign on 09/11/2019 in office or virtually; pt offered and accepted office appointment 09/11/2019 at 0920; will route to office for notification.

## 2019-09-11 ENCOUNTER — Ambulatory Visit (INDEPENDENT_AMBULATORY_CARE_PROVIDER_SITE_OTHER): Payer: Medicare Other | Admitting: Family Medicine

## 2019-09-11 ENCOUNTER — Encounter: Payer: Self-pay | Admitting: Family Medicine

## 2019-09-11 ENCOUNTER — Other Ambulatory Visit: Payer: Self-pay

## 2019-09-11 VITALS — BP 138/82 | HR 68 | Temp 97.8°F | Resp 14 | Ht 69.0 in | Wt 135.0 lb

## 2019-09-11 DIAGNOSIS — H42 Glaucoma in diseases classified elsewhere: Secondary | ICD-10-CM

## 2019-09-11 DIAGNOSIS — E1165 Type 2 diabetes mellitus with hyperglycemia: Secondary | ICD-10-CM | POA: Diagnosis not present

## 2019-09-11 DIAGNOSIS — I340 Nonrheumatic mitral (valve) insufficiency: Secondary | ICD-10-CM

## 2019-09-11 DIAGNOSIS — C61 Malignant neoplasm of prostate: Secondary | ICD-10-CM | POA: Diagnosis not present

## 2019-09-11 DIAGNOSIS — I071 Rheumatic tricuspid insufficiency: Secondary | ICD-10-CM

## 2019-09-11 DIAGNOSIS — D709 Neutropenia, unspecified: Secondary | ICD-10-CM | POA: Diagnosis not present

## 2019-09-11 DIAGNOSIS — IMO0002 Reserved for concepts with insufficient information to code with codable children: Secondary | ICD-10-CM

## 2019-09-11 DIAGNOSIS — E1139 Type 2 diabetes mellitus with other diabetic ophthalmic complication: Secondary | ICD-10-CM | POA: Diagnosis not present

## 2019-09-11 DIAGNOSIS — I10 Essential (primary) hypertension: Secondary | ICD-10-CM

## 2019-09-11 DIAGNOSIS — K5909 Other constipation: Secondary | ICD-10-CM | POA: Diagnosis not present

## 2019-09-11 DIAGNOSIS — R5383 Other fatigue: Secondary | ICD-10-CM

## 2019-09-11 NOTE — Progress Notes (Signed)
Name: Austin Hunt   MRN: [id number]    DOB: 02/04/1941   Date:09/11/2019       Progress Note  Subjective  Chief Complaint  Chief Complaint  Patient presents with  . Fatigue  . Weight Loss    HPI  Pt presents with concern for weight loss and fatigue. Weight appears stable since August visit, however he is down about 7lbs since July 2020. He reports significant fatigue - worse first thing in the morning for about 2 months.  He also notes that when he goes for his normal walk - about 45-53mn - he will feel more tired, sometimes gets a chest tightness for a few minutes that resolves as he continues to walks.   - Notes increased gas lately with decreased frequency in BM's.  HE reports urge to have BM, then will go to the bathroom and only pass gas sometimes.  Notes having done Cologuard in June 2020 and was normal.  He also had colonoscopy in 2012.  He endorses chronic constipation - drinking prune juice and laxative.  Denies abdominal pain, nausea/vomiting - Does have prostate cancer - seeing [facility] Dr. BErlene Quan- last visit June 2020, was told to return in 1 year - Sees Dr. CClayborn Bignessfor angina (mild intermittent), known coronary disease w/ s/p PCI stent, HTN, tricuspid and mitral valve insufficiency.  - Has history of neutropenia - CBC showed WBC's of 2.9. - Has DM - last A1C was 6.9%.  Does note that he is concerned the metformin may contribute to weight loss - has been on this medication for many years.   Wt Readings from Last 3 Encounters:  09/11/19 135 lb (61.2 kg)  06/20/19 133 lb 8 oz (60.6 kg)  05/30/19 142 lb (64.4 kg)     Patient Active Problem List   Diagnosis Date Noted  . Prostate cancer ([facility] 02/07/2018  . Cervical pain (neck) 10/13/2016  . 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 Long term current use of systemic steroids 01/07/2016  . Neutropenia ([facility] 01/07/2016  . Anxiety 04/08/2015  . Carotid artery narrowing  04/08/2015  . Chronic constipation 04/08/2015  . 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 Failure of erection 04/08/2015  . H/O inguinal hernia repair 04/08/2015  . HLD (hyperlipidemia) 04/08/2015  . 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 LBP (low back pain) 04/08/2015  . 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 Drug intolerance 04/08/2015  . Kidney lump 04/08/2015  . TI (tricuspid incompetence) 04/08/2015  . Unilateral recurrent femoral hernia without obstruction or gangrene 05/23/2014  . Femoral hernia 05/23/2014  . Right inguinal hernia 02/06/2014  . Cataract 01/16/2014  . Glaucoma 01/16/2014  . Reflux esophagitis 06/21/2009  . Coronary atherosclerosis 04/03/2009  . Benign enlargement of prostate 02/11/2009  . Decreased libido 01/12/2008  . Benign essential HTN 09/12/2007    Past Surgical History:  Procedure Laterality Date  . APPENDECTOMY  1954  . BLADDER SURGERY  Oct 2015  . CARDIAC CATHETERIZATION  2002   1 stent  . COLONOSCOPY    . EYE SURGERY  2011, 2013   cataracts  . EYE SURGERY Right May 2016   glaucoma  . HERNIA REPAIR Left 1967  . HERNIA REPAIR Right 05-10-14   Right femoral hernia repair Dr BBary Castillawith PerFix plug, no onlay mesh..   . PHOTOCOAGULATION WITH LASER Right 12/30/2015   Procedure: PHOTOCOAGULATION WITH LASER;  Surgeon: ARonnell Freshwater MD;  [place]  Service: Ophthalmology;  Laterality:  Right;  DIABETIC - oral meds IVA BLOCK  . PROSTATE SURGERY  Oct 2015    Family History  Problem Relation Age of Onset  . Diabetes Sister   . Diabetes Brother   . Diabetes Sister   . Prostate cancer Neg Hx   . Bladder Cancer Neg Hx     Social History   Socioeconomic History  . Marital status: Married    Spouse name: Onalee Hua  . Number of children: 4  . Years of education: some college  . Highest education level: 12th grade  Occupational History    Employer: RETIRED  Social Needs  . Financial resource strain: Not hard at all  .  Food insecurity    Worry: Never true    Inability: Never true  . Transportation needs    Medical: No    Non-medical: No  Tobacco Use  . Smoking status: Never Smoker  . Smokeless tobacco: Never Used  . Tobacco comment: smoking cessation materials not required  Substance and Sexual Activity  . Alcohol use: No  . Drug use: No  . Sexual activity: Yes  Lifestyle  . Physical activity    Days per week: 4 days    Minutes per session: 60 min  . Stress: Not at all  Relationships  . Social connections    Talks on phone: More than three times a week    Gets together: Twice a week    Attends religious service: More than 4 times per year    Active member of club or organization: No    Attends meetings of clubs or organizations: Never    Relationship status: Married  . Intimate partner violence    Fear of current or ex partner: No    Emotionally abused: No    Physically abused: No    Forced sexual activity: No  Other Topics Concern  . Not on file  Social History Narrative  . Not on file     Current Outpatient Medications:  .  ACCU-CHEK SOFTCLIX LANCETS lancets, USE TO TEST TWICE DAILY, Disp: 100 each, Rfl: 5 .  aspirin 81 MG tablet, Take 81 mg by mouth daily. , Disp: , Rfl:  .  blood glucose meter kit and supplies, Dispense based on patient and insurance preference. Use up to four times daily as directed. (FOR ICD-10 E10.9, E11.9)., Disp: 1 each, Rfl: 0 .  brimonidine-timolol (COMBIGAN) 0.2-0.5 % ophthalmic solution, Place 1 drop into both eyes every 12 (twelve) hours., Disp: , Rfl:  .  Cholecalciferol (VITAMIN D) 50 MCG (2000 UT) CAPS, Take 1 capsule (2,000 Units total) by mouth daily., Disp: 30 capsule, Rfl: 0 .  Cyanocobalamin (B-12) 1000 MCG SUBL, Place 1 tablet under the tongue daily., Disp: 30 tablet, Rfl: 0 .  diclofenac sodium (VOLTAREN) 1 % GEL, Apply 2 g topically 4 (four) times daily. (Patient taking differently: Apply 1 g topically 4 (four) times daily. ), Disp: 100 g, Rfl:  0 .  dorzolamide (TRUSOPT) 2 % ophthalmic solution, , Disp: , Rfl:  .  ezetimibe (ZETIA) 10 MG tablet, Take 1 tablet (10 mg total) by mouth daily., Disp: 90 tablet, Rfl: 3 .  Garlic 10 MG CAPS, Take 1 capsule by mouth daily. , Disp: , Rfl:  .  glucose blood (ACCU-CHEK ACTIVE STRIPS) test strip, Use as instructed, Disp: 100 each, Rfl: 12 .  hydrocortisone 2.5 % cream, , Disp: , Rfl:  .  isosorbide mononitrate (IMDUR) 30 MG 24 hr tablet, Take by mouth., Disp: ,  Rfl:  .  latanoprost (XALATAN) 0.005 % ophthalmic solution, , Disp: , Rfl:  .  metFORMIN (GLUCOPHAGE-XR) 500 MG 24 hr tablet, TAKE 2 TABLETS BY MOUTH ONCE EVERY MORNING AND 1 TABLET AT BEDTIME, Disp: 270 tablet, Rfl: 1 .  Multiple Vitamin (MULTIVITAMIN) tablet, Take 1 tablet by mouth daily., Disp: , Rfl:  .  OMEGA-3 FATTY ACIDS PO, Take 1 tablet by mouth daily., Disp: , Rfl:  .  sildenafil (VIAGRA) 100 MG tablet, Take 1 tablet (100 mg total) by mouth daily as needed for erectile dysfunction., Disp: 6 tablet, Rfl: 6 .  travoprost, benzalkonium, (TRAVATAN) 0.004 % ophthalmic solution, 1 drop at bedtime., Disp: , Rfl:  .  vitamin C (ASCORBIC ACID) 500 MG tablet, Take 500 mg by mouth daily. , Disp: , Rfl:  .  Zinc Acetate (GALZIN) 50 MG CAPS, Take 1 capsule by mouth 3 (three) times a week., Disp: , Rfl:   Allergies  Allergen Reactions  . Colesevelam Other (See Comments)  . Penicillins Itching and Other (See Comments)  . Statins Other (See Comments)    myalgia  . Welchol [Colesevelam Hcl]     I personally reviewed active problem list, medication list, allergies, notes from last encounter, lab results with the patient/caregiver today.   ROS  Ten systems reviewed and is negative except as mentioned in HPI  Objective  Vitals:   09/11/19 0948  BP: 138/82  Pulse: 68  Resp: 14  Temp: 97.8 F (36.6 C)  TempSrc: Temporal  SpO2: 99%  Weight: 135 lb (61.2 kg)  Height: _0  (1.753 m)    Body mass index is 19.94 kg/m.  Physical  Exam  Constitutional: Patient appears well-developed and well-nourished. No distress.  HENT: Head: Normocephalic and atraumatic.  Eyes: Conjunctivae and EOM are normal. No scleral icterus.  Neck: Normal range of motion. Neck supple. No JVD present. No thyromegaly present.  Cardiovascular: Normal rate, regular rhythm and normal heart sounds.  No murmur heard. No BLE edema. Pulmonary/Chest: Effort normal and breath sounds normal. No respiratory distress. Abdominal: Soft. Bowel sounds are normal, no distension. There is no tenderness. No masses. Musculoskeletal: Normal range of motion, no joint effusions. No gross deformities Neurological: Pt is alert and oriented to person, place, and time. No cranial nerve deficit. Coordination, balance, strength, speech and gait are normal.  Skin: Skin is warm and dry. No rash noted. No erythema.  Psychiatric: Patient has a normal mood and affect. behavior is normal. Judgment and thought content normal.  No results found for this or any previous visit (from the past 72 hour(s)).   PHQ2/9: Depression screen [facility] 2/9 09/11/2019 06/20/2019 05/30/2019 02/21/2019 05/24/2018  Decreased Interest 0 0 0 0 0  Down, Depressed, Hopeless 0 0 0 0 0  PHQ - 2 Score 0 0 0 0 0  Altered sleeping 0 0 - 0 0  Tired, decreased energy 0 0 - 0 0  Change in appetite 0 0 - 0 0  Feeling bad or failure about yourself  0 0 - 0 0  Trouble concentrating 0 0 - 0 0  Moving slowly or fidgety/restless 0 0 - 0 0  Suicidal thoughts 0 0 - 0 0  PHQ-9 Score 0 0 - 0 0  Difficult doing work/chores Not difficult at all - - - Not difficult at all   PHQ-2/9 Result is negative.    Fall Risk: Fall Risk  09/11/2019 06/20/2019 05/30/2019 02/21/2019 10/19/2018  Falls in the past year? 0 0 0 0 0  Number falls in past yr: 0 0 0 0 0  Injury with Fall? 0 0 0 0 0  Risk for fall due to : - - - - -  Risk for fall due to: Comment - - - - -  Follow up Falls evaluation completed - Falls prevention discussed - -     Assessment & Plan  1. Tricuspid valve insufficiency, unspecified etiology 2. Mitral valve insufficiency, unspecified etiology 3. Benign essential HTN - Advised to schedule with Dr. Etta Quill office for follow up due to increased frequency of his angina and fatigue.  4. Prostate cancer ([place]) - Followed by Urology, last PSA 1.3 and mostly unchanged from the year prior  5. Neutropenia, unspecified type ([place]) - CBC with Differential/Platelet  6. 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  Will check labs and then determine best medication course. - COMPLETE METABOLIC PANEL WITH GFR - Hemoglobin A1c  7. Chronic constipation - Prune Juice, increase water intake.  May consider miralax in the future, wants to wait at this time.   8. Fatigue, unspecified type - COMPLETE METABOLIC PANEL WITH GFR - CBC with Differential/Platelet - Hemoglobin A1c - TSH

## 2019-09-11 NOTE — Patient Instructions (Signed)
Please call Dr. Etta Quill office to schedule an appointment.

## 2019-09-12 LAB — COMPLETE METABOLIC PANEL WITH GFR
AG Ratio: 1.7 (calc) (ref 1.0–2.5)
ALT: 12 U/L (ref 9–46)
AST: 18 U/L (ref 10–35)
Albumin: 4.4 g/dL (ref 3.6–5.1)
Alkaline phosphatase (APISO): 62 U/L (ref 35–144)
BUN: 19 mg/dL (ref 7–25)
CO2: 31 mmol/L (ref 20–32)
Calcium: 10.2 mg/dL (ref 8.6–10.3)
Chloride: 100 mmol/L (ref 98–110)
Creat: 0.86 mg/dL (ref 0.70–1.11)
GFR, Est African American: 95 mL/min/{1.73_m2} (ref >=60)
GFR, Est Non African American: 82 mL/min/{1.73_m2} (ref >=60)
Globulin: 2.6 g/dL (calc) (ref 1.9–3.7)
Glucose, Bld: 125 mg/dL — ABNORMAL HIGH (ref 65–99)
Potassium: 5 mmol/L (ref 3.5–5.3)
Sodium: 139 mmol/L (ref 135–146)
Total Bilirubin: 0.5 mg/dL (ref 0.2–1.2)
Total Protein: 7 g/dL (ref 6.1–8.1)

## 2019-09-12 LAB — TSH: TSH: 1.96 mIU/L (ref 0.40–4.50)

## 2019-09-15 DIAGNOSIS — R011 Cardiac murmur, unspecified: Secondary | ICD-10-CM | POA: Diagnosis not present

## 2019-09-15 DIAGNOSIS — R5383 Other fatigue: Secondary | ICD-10-CM | POA: Diagnosis not present

## 2019-09-15 DIAGNOSIS — I208 Other forms of angina pectoris: Secondary | ICD-10-CM | POA: Diagnosis not present

## 2019-09-15 DIAGNOSIS — Z955 Presence of coronary angioplasty implant and graft: Secondary | ICD-10-CM | POA: Diagnosis not present

## 2019-09-15 DIAGNOSIS — M353 Polymyalgia rheumatica: Secondary | ICD-10-CM | POA: Diagnosis not present

## 2019-09-15 DIAGNOSIS — E7800 Pure hypercholesterolemia, unspecified: Secondary | ICD-10-CM | POA: Diagnosis not present

## 2019-09-15 DIAGNOSIS — I25118 Atherosclerotic heart disease of native coronary artery with other forms of angina pectoris: Secondary | ICD-10-CM | POA: Diagnosis not present

## 2019-09-15 DIAGNOSIS — R0602 Shortness of breath: Secondary | ICD-10-CM | POA: Diagnosis not present

## 2019-09-15 DIAGNOSIS — I10 Essential (primary) hypertension: Secondary | ICD-10-CM | POA: Diagnosis not present

## 2019-10-20 ENCOUNTER — Encounter: Payer: Self-pay | Admitting: Family Medicine

## 2019-10-20 ENCOUNTER — Ambulatory Visit (INDEPENDENT_AMBULATORY_CARE_PROVIDER_SITE_OTHER): Payer: Medicare Other | Admitting: Family Medicine

## 2019-10-20 VITALS — Wt 133.0 lb

## 2019-10-20 DIAGNOSIS — R634 Abnormal weight loss: Secondary | ICD-10-CM | POA: Diagnosis not present

## 2019-10-20 DIAGNOSIS — D709 Neutropenia, unspecified: Secondary | ICD-10-CM | POA: Diagnosis not present

## 2019-10-20 DIAGNOSIS — E538 Deficiency of other specified B group vitamins: Secondary | ICD-10-CM

## 2019-10-20 DIAGNOSIS — T466X5A Adverse effect of antihyperlipidemic and antiarteriosclerotic drugs, initial encounter: Secondary | ICD-10-CM

## 2019-10-20 DIAGNOSIS — H42 Glaucoma in diseases classified elsewhere: Secondary | ICD-10-CM

## 2019-10-20 DIAGNOSIS — N521 Erectile dysfunction due to diseases classified elsewhere: Secondary | ICD-10-CM

## 2019-10-20 DIAGNOSIS — E1140 Type 2 diabetes mellitus with diabetic neuropathy, unspecified: Secondary | ICD-10-CM

## 2019-10-20 DIAGNOSIS — E1136 Type 2 diabetes mellitus with diabetic cataract: Secondary | ICD-10-CM

## 2019-10-20 DIAGNOSIS — E1139 Type 2 diabetes mellitus with other diabetic ophthalmic complication: Secondary | ICD-10-CM

## 2019-10-20 DIAGNOSIS — Z8679 Personal history of other diseases of the circulatory system: Secondary | ICD-10-CM | POA: Diagnosis not present

## 2019-10-20 DIAGNOSIS — E559 Vitamin D deficiency, unspecified: Secondary | ICD-10-CM

## 2019-10-20 DIAGNOSIS — E441 Mild protein-calorie malnutrition: Secondary | ICD-10-CM

## 2019-10-20 DIAGNOSIS — E785 Hyperlipidemia, unspecified: Secondary | ICD-10-CM

## 2019-10-20 DIAGNOSIS — M791 Myalgia, unspecified site: Secondary | ICD-10-CM

## 2019-10-20 NOTE — Progress Notes (Signed)
Name: Austin Hunt   MRN: [id number]    DOB: 26-Mar-1941   Date:10/20/2019       Progress Note  Subjective  Chief Complaint  Chief Complaint  Patient presents with  . Diabetes  . Hypertension  . Hyperlipidemia    I connected with  [name] on 10/20/19 at  8:40 AM EST by telephone and verified that I am speaking with the correct person using two identifiers.  I discussed the limitations, risks, security and privacy concerns of performing an evaluation and management service by telephone and the availability of in person appointments. Staff also discussed with the patient that there may be a patient responsible charge related to this service. Patient Location: at home Provider Location: [facility]   HPI  DMII: glucose at homeis around100-120's he has not been skipping meals.HbgA1C was at goal at  6.9% He denies polyphagia, polydipsia or polyuria. Vision is better since he had glaucoma and cataract surgery.He also has ED and sees Urologist and has rx of Viagra at home.Nocturiausuallyonce per night.He has been taking Metformin but off Glipizide for about one year. We will consider going down on Metformin depending on A1C level   Leukopenia: WBC was low ,down to 3.1but last checked 10/2018 and back to normal, if still low this time we will refer him to hematologist, specially because of weight loss  BPH and history of prostate cancer: he is seeing Urologist and denies any obstructive symptoms, except for nocturia about 2 times per night. Last PSA June 2020 was 1.3   HTN: patient is off medication, no chest pain or palpitation, he states bp at home has been around 120's/70's  Dyslipidemia: he is not on statin therapy, he refuses medication - myalgia in the past, hewastaking Zetia now and last LDL went from 120 to 135 from 10/2018 to 06/2019,   Chronic constipation: taking Amitizaprnbecause it causes diarrhea, he is on lower dose of  Amitiza and stopped taking it because it was a capsule and cannot tolerate pork. He has increased water intake , he drinks prune juice prn   SOB with activity: He had a stress test done by Dr. Clayborn Bigness for evaluation of chest pain back in 11/2018 and results were. He went back in Nov, he states SOB and needs to take breaks but able to walk. Unable to tolerate statin therapy   Normal myocardial perfusion scan no evidence of stress-induced  myocardial ischemia ejection fraction of 64% conclusion negative scan.  Low risk scan  Malnutrition: temporal waiting, lost almost 8 lbs since last year, he is concerned about it, because at one point he had lost down from 14 lbs to 129 lbs at home.  He has been taking Glucerna to help keep his weight up. His weight about 10 years ago was around 155 lbs. At one point in his life he was 200 lbs. We will check CRP, Sed rate, CBC and comp panel to evaluate cause, recent PSA was within normal limits   Patient Active Problem List   Diagnosis Date Noted  . Prostate cancer ([place]) 02/07/2018  . Cervical pain (neck) 10/13/2016  . 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 Long term current use of systemic steroids 01/07/2016  . Neutropenia ([place]) 01/07/2016  . Anxiety 04/08/2015  . 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 Chronic constipation 04/08/2015  . 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 Failure of erection 04/08/2015  . H/O inguinal hernia repair 04/08/2015  . HLD (  hyperlipidemia) 04/08/2015  . 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 LBP (low back pain) 04/08/2015  . 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 Drug intolerance 04/08/2015  . Kidney lump 04/08/2015  . TI (tricuspid incompetence) 04/08/2015  . Unilateral recurrent femoral hernia without obstruction or gangrene 05/23/2014  . Femoral hernia 05/23/2014  . Right inguinal hernia 02/06/2014  . Cataract 01/16/2014  . Glaucoma 01/16/2014  . Reflux esophagitis 06/21/2009   . Coronary atherosclerosis 04/03/2009  . Benign enlargement of prostate 02/11/2009  . Decreased libido 01/12/2008  . Benign essential HTN 09/12/2007    Past Surgical History:  Procedure Laterality Date  . APPENDECTOMY  1954  . BLADDER SURGERY  Oct 2015  . CARDIAC CATHETERIZATION  2002   1 stent  . COLONOSCOPY    . EYE SURGERY  2011, 2013   cataracts  . EYE SURGERY Right May 2016   glaucoma  . HERNIA REPAIR Left 1967  . HERNIA REPAIR Right 05-10-14   Right femoral hernia repair Dr Bary Castilla with PerFix plug, no onlay mesh..   . PHOTOCOAGULATION WITH LASER Right 12/30/2015   Procedure: PHOTOCOAGULATION WITH LASER;  Surgeon: Ronnell Freshwater, MD;  Location: [facility];  Service: Ophthalmology;  Laterality: Right;  DIABETIC - oral meds IVA BLOCK  . PROSTATE SURGERY  Oct 2015    Family History  Problem Relation Age of Onset  . Diabetes Sister   . Diabetes Brother   . Diabetes Sister   . Prostate cancer Neg Hx   . Bladder Cancer Neg Hx     Social History   Socioeconomic History  . Marital status: Married    Spouse name: Onalee Hua  . Number of children: 4  . Years of education: some college  . Highest education level: 12th grade  Occupational History    Employer: RETIRED  Tobacco Use  . Smoking status: Never Smoker  . Smokeless tobacco: Never Used  . Tobacco comment: smoking cessation materials not required  Substance and Sexual Activity  . Alcohol use: No  . Drug use: No  . Sexual activity: Yes  Other Topics Concern  . Not on file  Social History Narrative  . Not on file   Social Determinants of Health   Financial Resource Strain:   . Difficulty of Paying Living Expenses: Not on file  Food Insecurity:   . Worried About Charity fundraiser in the Last Year: Not on file  . Ran Out of Food in the Last Year: Not on file  Transportation Needs:   . Lack of Transportation (Medical): Not on file  . Lack of Transportation (Non-Medical): Not on file   Physical Activity:   . Days of Exercise per Week: Not on file  . Minutes of Exercise per Session: Not on file  Stress:   . Feeling of Stress : Not on file  Social Connections: Unknown  . Frequency of Communication with Friends and Family: More than three times a week  . Frequency of Social Gatherings with Friends and Family: Twice a week  . Attends Religious Services: More than 4 times per year  . Active Member of Clubs or Organizations: No  . Attends Archivist Meetings: Never  . Marital Status: Not on file  Intimate Partner Violence:   . Fear of Current or Ex-Partner: Not on file  . Emotionally Abused: Not on file  . Physically Abused: Not on file  . Sexually Abused: Not on file     Current Outpatient Medications:  .  ACCU-CHEK  SOFTCLIX LANCETS lancets, USE TO TEST TWICE DAILY, Disp: 100 each, Rfl: 5 .  aspirin 81 MG tablet, Take 81 mg by mouth daily. , Disp: , Rfl:  .  blood glucose meter kit and supplies, Dispense based on patient and insurance preference. Use up to four times daily as directed. (FOR ICD-10 E10.9, E11.9)., Disp: 1 each, Rfl: 0 .  brimonidine-timolol (COMBIGAN) 0.2-0.5 % ophthalmic solution, Place 1 drop into both eyes every 12 (twelve) hours., Disp: , Rfl:  .  Cholecalciferol (VITAMIN D) 50 MCG (2000 UT) CAPS, Take 1 capsule (2,000 Units total) by mouth daily., Disp: 30 capsule, Rfl: 0 .  Cyanocobalamin (B-12) 1000 MCG SUBL, Place 1 tablet under the tongue daily., Disp: 30 tablet, Rfl: 0 .  diclofenac sodium (VOLTAREN) 1 % GEL, Apply 2 g topically 4 (four) times daily. (Patient taking differently: Apply 1 g topically 4 (four) times daily. ), Disp: 100 g, Rfl: 0 .  dorzolamide (TRUSOPT) 2 % ophthalmic solution, , Disp: , Rfl:  .  ezetimibe (ZETIA) 10 MG tablet, Take 1 tablet (10 mg total) by mouth daily., Disp: 90 tablet, Rfl: 3 .  Garlic 10 MG CAPS, Take 1 capsule by mouth daily. , Disp: , Rfl:  .  glucose blood (ACCU-CHEK ACTIVE STRIPS) test strip, Use  as instructed, Disp: 100 each, Rfl: 12 .  hydrocortisone 2.5 % cream, , Disp: , Rfl:  .  latanoprost (XALATAN) 0.005 % ophthalmic solution, , Disp: , Rfl:  .  metFORMIN (GLUCOPHAGE-XR) 500 MG 24 hr tablet, TAKE 2 TABLETS BY MOUTH ONCE EVERY MORNING AND 1 TABLET AT BEDTIME, Disp: 270 tablet, Rfl: 1 .  Multiple Vitamin (MULTIVITAMIN) tablet, Take 1 tablet by mouth daily., Disp: , Rfl:  .  OMEGA-3 FATTY ACIDS PO, Take 1 tablet by mouth daily., Disp: , Rfl:  .  sildenafil (VIAGRA) 100 MG tablet, Take 1 tablet (100 mg total) by mouth daily as needed for erectile dysfunction., Disp: 6 tablet, Rfl: 6 .  travoprost, benzalkonium, (TRAVATAN) 0.004 % ophthalmic solution, 1 drop at bedtime., Disp: , Rfl:  .  vitamin C (ASCORBIC ACID) 500 MG tablet, Take 500 mg by mouth daily. , Disp: , Rfl:  .  Zinc Acetate (GALZIN) 50 MG CAPS, Take 1 capsule by mouth 3 (three) times a week., Disp: , Rfl:   Allergies  Allergen Reactions  . Colesevelam Other (See Comments)  . Penicillins Itching and Other (See Comments)  . Statins Other (See Comments)    myalgia  . Welchol [Colesevelam Hcl]     I personally reviewed active problem list, medication list, allergies, family history, social history, health maintenance with the patient/caregiver today.   ROS  Ten systems reviewed and is negative except as mentioned in HPI   Objective  Virtual encounter, vitals  Obtained at home  There were no vitals filed for this visit..  There is no height or weight on file to calculate BMI.  Physical Exam  Awake, alert and oriented   PHQ2/9: Depression screen [facility] 2/9 10/20/2019 09/11/2019 06/20/2019 05/30/2019 02/21/2019  Decreased Interest 0 0 0 0 0  Down, Depressed, Hopeless 0 0 0 0 0  PHQ - 2 Score 0 0 0 0 0  Altered sleeping 0 0 0 - 0  Tired, decreased energy 0 0 0 - 0  Change in appetite 0 0 0 - 0  Feeling bad or failure about yourself  0 0 0 - 0  Trouble concentrating 0 0 0 - 0  Moving slowly or fidgety/restless  0 0 0 - 0  Suicidal thoughts 0 0 0 - 0  PHQ-9 Score 0 0 0 - 0  Difficult doing work/chores - Not difficult at all - - -   PHQ-2/9 Result is negative.    Fall Risk: Fall Risk  10/20/2019 09/11/2019 06/20/2019 05/30/2019 02/21/2019  Falls in the past year? 0 0 0 0 0  Number falls in past yr: 0 0 0 0 0  Injury with Fall? 0 0 0 0 0  Risk for fall due to : - - - - -  Risk for fall due to: Comment - - - - -  Follow up - Falls evaluation completed - Falls prevention discussed -     Assessment & Plan  1. Weight loss  - Sedimentation rate - C-reactive protein  2. History of hypertension  Doing well off medication   3. Neutropenia, unspecified type ([place])  - CBC with Differential/Platelet  4. Dyslipidemia  - Lipid panel  5. B12 deficiency  Recheck level   6. Vitamin D deficiency  Recheck Vitamin D   7. 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 Type 2 diabetes mellitus with glaucoma and cataract ([facility])  - Hemoglobin A1c  9. Myalgia due to statin  On zetia  10. Mild protein-calorie malnutrition ([place])  I discussed the assessment and treatment plan with the patient. The patient was provided an opportunity to ask questions and all were answered. The patient agreed with the plan and demonstrated an understanding of the instructions.   The patient was advised to call back or seek an in-person evaluation if the symptoms worsen or if the condition fails to improve as anticipated.  I provided 25  minutes of non-face-to-face time during this encounter.  Loistine Chance, MD

## 2019-11-16 ENCOUNTER — Other Ambulatory Visit: Payer: Self-pay

## 2019-11-16 ENCOUNTER — Ambulatory Visit (INDEPENDENT_AMBULATORY_CARE_PROVIDER_SITE_OTHER): Payer: Medicare Other | Admitting: Podiatry

## 2019-11-16 ENCOUNTER — Encounter: Payer: Self-pay | Admitting: Podiatry

## 2019-11-16 DIAGNOSIS — B351 Tinea unguium: Secondary | ICD-10-CM

## 2019-11-16 DIAGNOSIS — M79676 Pain in unspecified toe(s): Secondary | ICD-10-CM | POA: Diagnosis not present

## 2019-11-16 DIAGNOSIS — M2010 Hallux valgus (acquired), unspecified foot: Secondary | ICD-10-CM

## 2019-11-16 DIAGNOSIS — E119 Type 2 diabetes mellitus without complications: Secondary | ICD-10-CM

## 2019-11-16 NOTE — Progress Notes (Signed)
Complaint:  Visit Type: Patient returns to my office for continued preventative foot care services. Complaint: Patient states" my nails have grown long and thick and become painful to walk and wear shoes" Patient has been diagnosed with DM with neuropathy. The patient presents for preventative foot care services. No changes to ROS  Podiatric Exam: Vascular: dorsalis pedis and posterior tibial pulses are palpable bilateral. Capillary return is immediate. Temperature gradient is WNL. Skin turgor WNL  Sensorium: Normal Semmes Weinstein monofilament test. Normal tactile sensation bilaterally. Nail Exam: Pt has thick disfigured discolored nails with subungual debris noted bilateral entire nail hallux through fifth toenails Ulcer Exam: There is no evidence of ulcer or pre-ulcerative changes or infection. Orthopedic Exam: Muscle tone and strength are WNL. No limitations in general ROM. No crepitus or effusions noted. Foot type and digits show no abnormalities.  HAV  B/L. Bony prominence at fifth metabase. Skin: No Porokeratosis. No infection or ulcers  Diagnosis:  Onychomycosis, , Pain in right toe, pain in left toes,  Diabetes with neuropathy  HAV  B/L  Treatment & Plan Procedures and Treatment: Consent by patient was obtained for treatment procedures. The patient understood the discussion of treatment and procedures well. All questions were answered thoroughly reviewed. Debridement of mycotic and hypertrophic toenails, 1 through 5 bilateral and clearing of subungual debris. No ulceration, no infection noted.      Return Visit-Office Procedure: Patient instructed to return to the office for a follow up visit 4 months for continued evaluation and treatment.    Gardiner Barefoot DPM

## 2019-11-17 ENCOUNTER — Ambulatory Visit: Payer: Medicare Other | Admitting: Urology

## 2019-11-24 ENCOUNTER — Ambulatory Visit (INDEPENDENT_AMBULATORY_CARE_PROVIDER_SITE_OTHER): Payer: Medicare Other | Admitting: Urology

## 2019-11-24 ENCOUNTER — Other Ambulatory Visit: Payer: Self-pay

## 2019-11-24 ENCOUNTER — Encounter: Payer: Self-pay | Admitting: Urology

## 2019-11-24 VITALS — BP 148/70 | HR 65 | Ht 69.0 in | Wt 133.0 lb

## 2019-11-24 DIAGNOSIS — N4889 Other specified disorders of penis: Secondary | ICD-10-CM

## 2019-11-24 DIAGNOSIS — C61 Malignant neoplasm of prostate: Secondary | ICD-10-CM

## 2019-11-24 NOTE — Progress Notes (Signed)
11/24/2019 12:33 PM   Thereasa Hunt 03/04/1941 [id number]  Referring provider: [name], MD [address] [place],  [place] [address]  Chief Complaint  Patient presents with  . Penis Pain    HPI: 79 year old male who presents to discuss an episode of penile pain.  He reports that he called me this point about a month ago.  At that point in time, he was having some dull aching in his penis primarily at nighttime.  It lasted 3 to 4 days.  It was not associated with any urinary symptoms or scrotal pain or discomfort.  He did not notice any swelling.  No penile trauma.  Ultimately, the pain resolved spontaneously and has not had a recurrence.  He does occasionally notice some discomfort in his left inguinal area which comes and goes.  This is not severe.  No bulging.  He is uncircumcised.  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  He is due for PSA and follow-up in June.  He continues have nocturia x2 which is stable.  Minimal bother.   PMH: Past Medical History:  Diagnosis Date  . 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 Balanitis   . BPH (benign prostatic hypertrophy) with urinary obstruction   . 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 Diabetes mellitus without complication ([place])   . Glaucoma   . Headache    rare - 1x/mo  . Nocturia   . Prostate cancer [facility])     Surgical History: Past Surgical History:  Procedure Laterality Date  . APPENDECTOMY  1954  . BLADDER SURGERY  Oct 2015  . CARDIAC CATHETERIZATION  2002   1 stent  . COLONOSCOPY    . EYE SURGERY  2011, 2013   cataracts  . EYE SURGERY Right May 2016   glaucoma  . HERNIA REPAIR Left 1967  . HERNIA REPAIR Right 05-10-14   Right femoral hernia repair Dr Bary Castilla with PerFix plug, no onlay mesh..   . PHOTOCOAGULATION WITH LASER Right 12/30/2015   Procedure: PHOTOCOAGULATION WITH LASER;  Surgeon: Ronnell Freshwater, MD;  Location: [place];  Service: Ophthalmology;  Laterality: Right;  DIABETIC - oral meds IVA BLOCK  . PROSTATE SURGERY  Oct 2015    Home Medications:  Allergies as of 11/24/2019      Reactions   Colesevelam Other (See Comments)   Penicillins Itching, Other (See Comments)   Statins Other (See Comments)   myalgia   Welchol [colesevelam Hcl]       Medication List       Accurate as of November 24, 2019 12:33 PM. If you have any questions, ask your nurse or doctor.        Accu-Chek Softclix Lancets lancets USE TO TEST TWICE DAILY   aspirin 81 MG tablet Take 81 mg by mouth daily.   B-12 1000 MCG Subl Place 1 tablet under the tongue daily.   blood glucose meter kit and supplies Dispense based on patient and insurance preference. Use up to four times daily as directed. (FOR ICD-10 E10.9, E11.9).   Combigan 0.2-0.5 % ophthalmic solution Generic drug: brimonidine-timolol Place 1 drop into both eyes every 12 (twelve) hours.   diclofenac sodium 1 % Gel Commonly known as: VOLTAREN Apply 2 g topically 4 (four) times daily. What changed: how much to take   dorzolamide 2 % ophthalmic solution Commonly known as: TRUSOPT   ezetimibe 10 MG tablet Commonly  known as: Zetia Take 1 tablet (10 mg total) by mouth daily.   Galzin 50 MG Caps Generic drug: Zinc Acetate Take 1 capsule by mouth 3 (three) times a week.   Garlic 10 MG Caps Take 1 capsule by mouth daily.   glucose blood test strip Commonly known as: ACCU-CHEK ACTIVE STRIPS Use as instructed   hydrocortisone 2.5 % cream   isosorbide mononitrate 30 MG 24 hr tablet Commonly known as: IMDUR Take by mouth.   latanoprost 0.005 % ophthalmic solution Commonly known as: XALATAN   metFORMIN 500 MG 24 hr tablet Commonly known as: GLUCOPHAGE-XR TAKE 2 TABLETS BY MOUTH ONCE EVERY MORNING AND 1 TABLET AT BEDTIME   metoprolol succinate 25 MG 24 hr tablet Commonly known as: TOPROL-XL Take by mouth.   multivitamin tablet Take 1 tablet by  mouth daily.   OMEGA-3 FATTY ACIDS PO Take 1 tablet by mouth daily.   sildenafil 100 MG tablet Commonly known as: VIAGRA Take 1 tablet (100 mg total) by mouth daily as needed for erectile dysfunction.   travoprost (benzalkonium) 0.004 % ophthalmic solution Commonly known as: TRAVATAN 1 drop at bedtime.   vitamin C 500 MG tablet Commonly known as: ASCORBIC ACID Take 500 mg by mouth daily.   Vitamin D 50 MCG (2000 UT) Caps Take 1 capsule (2,000 Units total) by mouth daily.       Allergies:  Allergies  Allergen Reactions  . Colesevelam Other (See Comments)  . Penicillins Itching and Other (See Comments)  . Statins Other (See Comments)    myalgia  . Welchol [Colesevelam Hcl]     Family History: Family History  Problem Relation Age of Onset  . Diabetes Sister   . Diabetes Brother   . Diabetes Sister   . Prostate cancer Neg Hx   . Bladder Cancer Neg Hx     Social History:  reports that he has never smoked. He has never used smokeless tobacco. He reports that he does not drink alcohol or use drugs.  ROS: UROLOGY Frequent Urination?: Yes Hard to postpone urination?: No Burning/pain with urination?: No Get up at night to urinate?: Yes Leakage of urine?: No Urine stream starts and stops?: No Trouble starting stream?: No Do you have to strain to urinate?: No Blood in urine?: No Urinary tract infection?: No Sexually transmitted disease?: No Injury to kidneys or bladder?: No Painful intercourse?: No Weak stream?: Yes Erection problems?: Yes Penile pain?: Yes  Gastrointestinal Nausea?: No Vomiting?: No Indigestion/heartburn?: No Diarrhea?: No Constipation?: No  Constitutional Fever: No Night sweats?: No Weight loss?: No Fatigue?: No  Skin Skin rash/lesions?: No Itching?: No  Eyes Blurred vision?: No Double vision?: No  Ears/Nose/Throat Sore throat?: No Sinus problems?: No  Hematologic/Lymphatic Swollen glands?: No Easy bruising?:  No  Cardiovascular Leg swelling?: No Chest pain?: No  Respiratory Cough?: No Shortness of breath?: No  Endocrine Excessive thirst?: No  Musculoskeletal Back pain?: No Joint pain?: No  Neurological Headaches?: No Dizziness?: No  Psychologic Depression?: No Anxiety?: No  Physical Exam: BP (!) 148/70   Pulse 65   Ht 5' 9"  (1.753 m)   Wt 133 lb (60.3 kg)   BMI 19.64 kg/m   Constitutional:  Alert and oriented, No acute distress. HEENT: [place] AT, moist mucus membranes.  Trachea midline, no masses. Cardiovascular: No clubbing, cyanosis, or edema. Respiratory: Normal respiratory effort, no increased work of breathing. GI: Abdomen is soft, nontender, nondistended, no abdominal masses GU: Uncircumcised phallus with easily retractable foreskin, no erythema or  smegma.  Patent meatus.  Normal phallus, nontender.  Normal testicles bilaterally.  No evidence of inguinal hernia bilaterally with Valsalva. Skin: No rashes, bruises or suspicious lesions. Neurologic: Grossly intact, no focal deficits, moving all 4 extremities. Psychiatric: Normal mood and affect.   Assessment & Plan:    1. Penile pain Resolved  Unclear etiology however the patient is uncircumcised and may have had some inflammatory component to his penile discomfort which has now resolved  Advised patient return if his symptoms recur  2. Prostate cancer [facility]) Incidental prostate cancer on TURP specimen, due for follow-up in June with PSA, he is agreeable this plan    Return for as scheduled in June.  Hollice Espy, MD  [facility] [address] [place], [place] [address] [phone]

## 2019-11-27 DIAGNOSIS — J340 Abscess, furuncle and carbuncle of nose: Secondary | ICD-10-CM | POA: Diagnosis not present

## 2019-11-27 DIAGNOSIS — R221 Localized swelling, mass and lump, neck: Secondary | ICD-10-CM | POA: Diagnosis not present

## 2019-11-28 DIAGNOSIS — H401133 Primary open-angle glaucoma, bilateral, severe stage: Secondary | ICD-10-CM | POA: Diagnosis not present

## 2019-12-08 ENCOUNTER — Telehealth: Payer: Self-pay

## 2019-12-08 NOTE — Telephone Encounter (Signed)
Copied from [place] [id number]. Topic: General - Inquiry >> Dec 08, 2019 11:16 AM Austin Hunt L wrote: Reason for CRM:   Pt states that he needs to speak with a nurse or a doctor about his medication.  Pt would not allow me to assist to find out anymore information.   Patient was called. He was here and seen by [facility]. He states that they discussed him taking to much Metformin. He said when he takes only one Metformin in the mornings his bs is the same as it would be if he takes 2. He said if he eats sweets his bs is no higher but lower than what it normally is. Please advise.

## 2019-12-13 NOTE — Telephone Encounter (Signed)
Patient has been called. He will come in sometime tomorrow and have labs done then he will schedule a virtual visit.

## 2019-12-15 DIAGNOSIS — H42 Glaucoma in diseases classified elsewhere: Secondary | ICD-10-CM | POA: Diagnosis not present

## 2019-12-15 DIAGNOSIS — E1139 Type 2 diabetes mellitus with other diabetic ophthalmic complication: Secondary | ICD-10-CM | POA: Diagnosis not present

## 2019-12-15 DIAGNOSIS — R5383 Other fatigue: Secondary | ICD-10-CM | POA: Diagnosis not present

## 2019-12-15 DIAGNOSIS — E1165 Type 2 diabetes mellitus with hyperglycemia: Secondary | ICD-10-CM | POA: Diagnosis not present

## 2019-12-15 DIAGNOSIS — D709 Neutropenia, unspecified: Secondary | ICD-10-CM | POA: Diagnosis not present

## 2019-12-15 DIAGNOSIS — R634 Abnormal weight loss: Secondary | ICD-10-CM | POA: Diagnosis not present

## 2019-12-15 DIAGNOSIS — E785 Hyperlipidemia, unspecified: Secondary | ICD-10-CM | POA: Diagnosis not present

## 2019-12-15 DIAGNOSIS — I10 Essential (primary) hypertension: Secondary | ICD-10-CM | POA: Diagnosis not present

## 2019-12-15 DIAGNOSIS — E559 Vitamin D deficiency, unspecified: Secondary | ICD-10-CM | POA: Diagnosis not present

## 2019-12-15 DIAGNOSIS — E1136 Type 2 diabetes mellitus with diabetic cataract: Secondary | ICD-10-CM | POA: Diagnosis not present

## 2019-12-15 DIAGNOSIS — E538 Deficiency of other specified B group vitamins: Secondary | ICD-10-CM | POA: Diagnosis not present

## 2019-12-16 LAB — COMPLETE METABOLIC PANEL WITH GFR
AG Ratio: 1.7 (calc) (ref 1.0–2.5)
ALT: 11 U/L (ref 9–46)
AST: 17 U/L (ref 10–35)
Albumin: 4.3 g/dL (ref 3.6–5.1)
Alkaline phosphatase (APISO): 62 U/L (ref 35–144)
BUN: 16 mg/dL (ref 7–25)
CO2: 31 mmol/L (ref 20–32)
Calcium: 9.7 mg/dL (ref 8.6–10.3)
Chloride: 103 mmol/L (ref 98–110)
Creat: 0.88 mg/dL (ref 0.70–1.11)
GFR, Est African American: 94 mL/min/{1.73_m2} (ref >=60)
GFR, Est Non African American: 81 mL/min/{1.73_m2} (ref >=60)
Globulin: 2.5 g/dL (calc) (ref 1.9–3.7)
Glucose, Bld: 139 mg/dL — ABNORMAL HIGH (ref 65–99)
Potassium: 4.5 mmol/L (ref 3.5–5.3)
Sodium: 140 mmol/L (ref 135–146)
Total Bilirubin: 0.5 mg/dL (ref 0.2–1.2)
Total Protein: 6.8 g/dL (ref 6.1–8.1)

## 2019-12-16 LAB — CBC WITH DIFFERENTIAL/PLATELET
Absolute Monocytes: 261 cells/uL (ref 200–950)
Basophils Absolute: 20 cells/uL (ref 0–200)
Basophils Relative: 0.7 %
Eosinophils Absolute: 90 cells/uL (ref 15–500)
Eosinophils Relative: 3.1 %
HCT: 42.9 % (ref 38.5–50.0)
Hemoglobin: 14.4 g/dL (ref 13.2–17.1)
Lymphs Abs: 1201 cells/uL (ref 850–3900)
MCH: 29.8 pg (ref 27.0–33.0)
MCHC: 33.6 g/dL (ref 32.0–36.0)
MCV: 88.8 fL (ref 80.0–100.0)
MPV: 9.2 fL (ref 7.5–12.5)
Monocytes Relative: 9 %
Neutro Abs: 1328 cells/uL — ABNORMAL LOW (ref 1500–7800)
Neutrophils Relative %: 45.8 %
Platelets: 218 10*3/uL (ref 140–400)
RBC: 4.83 10*6/uL (ref 4.20–5.80)
RDW: 11.8 % (ref 11.0–15.0)
Total Lymphocyte: 41.4 %
WBC: 2.9 10*3/uL — ABNORMAL LOW (ref 3.8–10.8)

## 2019-12-16 LAB — LIPID PANEL
Cholesterol: 197 mg/dL (ref <200)
HDL: 57 mg/dL (ref >=40)
LDL Cholesterol (Calc): 122 mg/dL (calc) — ABNORMAL HIGH
Non-HDL Cholesterol (Calc): 140 mg/dL (calc) — ABNORMAL HIGH (ref <130)
Total CHOL/HDL Ratio: 3.5 (calc) (ref <5.0)
Triglycerides: 81 mg/dL (ref <150)

## 2019-12-16 LAB — C-REACTIVE PROTEIN: CRP: 3 mg/L (ref <8.0)

## 2019-12-16 LAB — SEDIMENTATION RATE

## 2019-12-16 LAB — SED RATE MANUAL WEST RFLX: SED RATE BY MODIFIED WESTERGREN,MANUAL: 8 mm/h (ref 0–20)

## 2019-12-16 LAB — TSH: TSH: 3.08 mIU/L (ref 0.40–4.50)

## 2019-12-16 LAB — VITAMIN B12: Vitamin B-12: 583 pg/mL (ref 200–1100)

## 2019-12-16 LAB — VITAMIN D 25 HYDROXY (VIT D DEFICIENCY, FRACTURES): Vit D, 25-Hydroxy: 61 ng/mL (ref 30–100)

## 2019-12-16 LAB — HEMOGLOBIN A1C
Hgb A1c MFr Bld: 7.3 % of total Hgb — ABNORMAL HIGH (ref <5.7)
Mean Plasma Glucose: 163 (calc)
eAG (mmol/L): 9 (calc)

## 2019-12-27 ENCOUNTER — Ambulatory Visit: Payer: Medicare Other | Attending: Internal Medicine

## 2019-12-27 DIAGNOSIS — Z20822 Contact with and (suspected) exposure to covid-19: Secondary | ICD-10-CM

## 2019-12-28 LAB — NOVEL CORONAVIRUS, NAA: SARS-CoV-2, NAA: NOT DETECTED

## 2020-01-15 ENCOUNTER — Telehealth: Payer: Self-pay

## 2020-01-15 NOTE — Telephone Encounter (Signed)
Copied from [place] [id number]. Topic: General - Other >> Jan 15, 2020 10:51 AM Antonieta Iba C wrote: Reason for CRM: pt called in requesting a call back. Pt says that he has been experiencing some back pain for the last few days. Pt says that he would like a call back to discuss further. >> Jan 15, 2020  1:05 PM Andria Frames L wrote: Pt calling back to check status. Pt would like a call on cell [phone]  >> Jan 15, 2020  1:00 PM Myatt, Marland Kitchen wrote: lvm for scheduling  Called patient in regards to his back pain. He states his back has been hurting for the past couple of days. He thinks he might have kidney stones. He has had these symptoms before and he drank lemon juice. He will drink some lemon juice for a few days to see if that helps. He will also schedule appointment.

## 2020-01-16 ENCOUNTER — Other Ambulatory Visit: Payer: Self-pay

## 2020-01-16 ENCOUNTER — Ambulatory Visit (INDEPENDENT_AMBULATORY_CARE_PROVIDER_SITE_OTHER): Payer: Medicare Other | Admitting: Internal Medicine

## 2020-01-16 ENCOUNTER — Encounter: Payer: Self-pay | Admitting: Internal Medicine

## 2020-01-16 VITALS — BP 140/68 | Temp 97.8°F | Resp 16 | Ht 69.0 in | Wt 134.0 lb

## 2020-01-16 DIAGNOSIS — M545 Low back pain, unspecified: Secondary | ICD-10-CM

## 2020-01-16 DIAGNOSIS — L299 Pruritus, unspecified: Secondary | ICD-10-CM | POA: Diagnosis not present

## 2020-01-16 DIAGNOSIS — N529 Male erectile dysfunction, unspecified: Secondary | ICD-10-CM

## 2020-01-16 LAB — POCT URINALYSIS DIPSTICK
Bilirubin, UA: NEGATIVE
Blood, UA: NEGATIVE
Glucose, UA: POSITIVE — AB
Ketones, UA: NEGATIVE
Leukocytes, UA: NEGATIVE
Nitrite, UA: NEGATIVE
Odor: NORMAL
Protein, UA: NEGATIVE
Spec Grav, UA: 1.01 (ref 1.010–1.025)
Urobilinogen, UA: 0.2 E.U./dL
pH, UA: 7.5 (ref 5.0–8.0)

## 2020-01-16 MED ORDER — ACCU-CHEK ACTIVE VI STRP: ORAL_STRIP | 12 refills | Status: DC

## 2020-01-16 NOTE — Telephone Encounter (Signed)
Patient was called .

## 2020-01-16 NOTE — Patient Instructions (Signed)
Stay well hydrated  Can apply a 1% hydrocortisone cream to the itch over the scar area on the lower abdomen

## 2020-01-16 NOTE — Progress Notes (Signed)
Patient ID: Austin Hunt, male    DOB: 1941-08-21, 79 y.o.   MRN: [id number]  PCP: [name], MD  Chief Complaint  Patient presents with  . Back Pain    lower right side    Subjective:   Austin Hunt is a 79 y.o. male, presents to clinic with CC of the following:  Chief Complaint  Patient presents with  . Back Pain    lower right side    HPI:  Patient is an 79 year old male patient of [name] with significant past medical history as noted in the active problem list who presents today with back pain.  He noted it started last Wednesday, was more off and on when it occurred, and felt it in the right upper back area where his kidney is located.  He states he had this type of pain once before, and was a kidney stone.  He drank lemon juice and it worked helping him pass that stone, and he noted he did actually pass a stone in a cup that last time.  He again drank lemon juice this time to try to help get it to pass if it was a stone.  He states his pain has eventually subsided, and had no pain yesterday, and one time earlier today, felt a very mild discomfort, not marked.  He was questioning whether to come to this visit, although was recommended by others to come and get checked. He denies any fevers, no urinary symptoms of concern with no pain when urinating, no blood in the urine, no frequency.  Denies any abdominal pains or pain radiating around into the abdomen. He has noted some itchiness over the scar in the right suprapubic region where he had a hernia surgery. He states his blood sugars have been controlled, he checks them regularly, and it was 111 this morning, and none have been greater than 120 in the very recent past in the morning.  (Asked as there was some mild glucose in the urine). He also has a ED, and noted that 100 mg of the Viagra product has not been helpful all the time, and was wondering if there was something stronger.  He does have a follow-up with  urology in the future noted.  Patient Active Problem List   Diagnosis Date Noted  . Prostate cancer ([place]) 02/07/2018  . Cervical pain (neck) 10/13/2016  . 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 Long term current use of systemic steroids 01/07/2016  . Neutropenia ([place]) 01/07/2016  . Anxiety 04/08/2015  . 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 Chronic constipation 04/08/2015  . 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 Failure of erection 04/08/2015  . H/O inguinal hernia repair 04/08/2015  . HLD (hyperlipidemia) 04/08/2015  . 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 LBP (low back pain) 04/08/2015  . 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 Drug intolerance 04/08/2015  . Kidney lump 04/08/2015  . TI (tricuspid incompetence) 04/08/2015  . Unilateral recurrent femoral hernia without obstruction or gangrene 05/23/2014  . Femoral hernia 05/23/2014  . Right inguinal hernia 02/06/2014  . Cataract 01/16/2014  . Glaucoma 01/16/2014  . Reflux esophagitis 06/21/2009  . Coronary atherosclerosis 04/03/2009  . Benign enlargement of prostate 02/11/2009  . Decreased libido 01/12/2008  . Benign essential HTN 09/12/2007      Current Outpatient Medications:  .  ACCU-CHEK SOFTCLIX LANCETS lancets, USE TO TEST TWICE DAILY, Disp: 100 each, Rfl: 5 .  aspirin 81 MG tablet, Take  81 mg by mouth daily. , Disp: , Rfl:  .  blood glucose meter kit and supplies, Dispense based on patient and insurance preference. Use up to four times daily as directed. (FOR ICD-10 E10.9, E11.9)., Disp: 1 each, Rfl: 0 .  brimonidine-timolol (COMBIGAN) 0.2-0.5 % ophthalmic solution, Place 1 drop into both eyes every 12 (twelve) hours., Disp: , Rfl:  .  Cholecalciferol (VITAMIN D) 50 MCG (2000 UT) CAPS, Take 1 capsule (2,000 Units total) by mouth daily., Disp: 30 capsule, Rfl: 0 .  dorzolamide (TRUSOPT) 2 % ophthalmic solution, , Disp: , Rfl:  .  ezetimibe (ZETIA) 10 MG tablet, Take 1 tablet (10 mg  total) by mouth daily., Disp: 90 tablet, Rfl: 3 .  Garlic 10 MG CAPS, Take 1 capsule by mouth daily. , Disp: , Rfl:  .  glucose blood (ACCU-CHEK ACTIVE STRIPS) test strip, Use as instructed, Disp: 100 each, Rfl: 12 .  hydrocortisone 2.5 % cream, , Disp: , Rfl:  .  latanoprost (XALATAN) 0.005 % ophthalmic solution, , Disp: , Rfl:  .  metFORMIN (GLUCOPHAGE-XR) 500 MG 24 hr tablet, TAKE 2 TABLETS BY MOUTH ONCE EVERY MORNING AND 1 TABLET AT BEDTIME, Disp: 270 tablet, Rfl: 1 .  Multiple Vitamin (MULTIVITAMIN) tablet, Take 1 tablet by mouth daily., Disp: , Rfl:  .  OMEGA-3 FATTY ACIDS PO, Take 1 tablet by mouth daily., Disp: , Rfl:  .  sildenafil (VIAGRA) 100 MG tablet, Take 1 tablet (100 mg total) by mouth daily as needed for erectile dysfunction., Disp: 6 tablet, Rfl: 6 .  vitamin C (ASCORBIC ACID) 500 MG tablet, Take 500 mg by mouth daily. , Disp: , Rfl:  .  Zinc Acetate (GALZIN) 50 MG CAPS, Take 1 capsule by mouth 3 (three) times a week., Disp: , Rfl:  .  Cyanocobalamin (B-12) 1000 MCG SUBL, Place 1 tablet under the tongue daily. (Patient not taking: Reported on 01/16/2020), Disp: 30 tablet, Rfl: 0   Allergies  Allergen Reactions  . Colesevelam Other (See Comments)  . Penicillins Itching and Other (See Comments)  . Statins Other (See Comments)    myalgia  . Welchol [Colesevelam Hcl]      Past Surgical History:  Procedure Laterality Date  . APPENDECTOMY  1954  . BLADDER SURGERY  Oct 2015  . CARDIAC CATHETERIZATION  2002   1 stent  . COLONOSCOPY    . EYE SURGERY  2011, 2013   cataracts  . EYE SURGERY Right May 2016   glaucoma  . HERNIA REPAIR Left 1967  . HERNIA REPAIR Right 05-10-14   Right femoral hernia repair Dr Bary Castilla with PerFix plug, no onlay mesh..   . PHOTOCOAGULATION WITH LASER Right 12/30/2015   Procedure: PHOTOCOAGULATION WITH LASER;  Surgeon: Ronnell Freshwater, MD;  Location: [place];  Service: Ophthalmology;  Laterality: Right;  DIABETIC - oral meds  IVA BLOCK  . PROSTATE SURGERY  Oct 2015     Family History  Problem Relation Age of Onset  . Diabetes Sister   . Diabetes Brother   . Diabetes Sister   . Prostate cancer Neg Hx   . Bladder Cancer Neg Hx      Social History   Tobacco Use  . Smoking status: Never Smoker  . Smokeless tobacco: Never Used  . Tobacco comment: smoking cessation materials not required  Substance Use Topics  . Alcohol use: No    With staff assistance, above reviewed with the patient today.  ROS: As per HPI, otherwise no specific  complaints on a limited and focused system review   No results found for this or any previous visit (from the past 72 hour(s)).   PHQ2/9: Depression screen [facility] 2/9 01/16/2020 10/20/2019 09/11/2019 06/20/2019 05/30/2019  Decreased Interest 0 0 0 0 0  Down, Depressed, Hopeless 0 0 0 0 0  PHQ - 2 Score 0 0 0 0 0  Altered sleeping 0 0 0 0 -  Tired, decreased energy 0 0 0 0 -  Change in appetite 0 0 0 0 -  Feeling bad or failure about yourself  0 0 0 0 -  Trouble concentrating 0 0 0 0 -  Moving slowly or fidgety/restless 0 0 0 0 -  Suicidal thoughts 0 0 0 0 -  PHQ-9 Score 0 0 0 0 -  Difficult doing work/chores Not difficult at all - Not difficult at all - -   PHQ-2/9 Result is neg  Fall Risk: Fall Risk  01/16/2020 10/20/2019 09/11/2019 06/20/2019 05/30/2019  Falls in the past year? 0 0 0 0 0  Number falls in past yr: 0 0 0 0 0  Injury with Fall? 0 0 0 0 0  Risk for fall due to : - - - - -  Risk for fall due to: Comment - - - - -  Follow up - - Falls evaluation completed - Falls prevention discussed      Objective:   Vitals:   01/16/20 1403  BP: 140/68  Resp: 16  Temp: 97.8 F (36.6 C)  TempSrc: Temporal  SpO2: 99%  Weight: 134 lb (60.8 kg)  Height: _0  (1.753 m)    Body mass index is 19.79 kg/m.  Physical Exam   NAD, masked, pleasant HEENT - [place]/AT, sclera anicteric,conj - non-inj'ed,  Abd - soft, NT, ND, BS+,  no masses, + scar right suprapubic  region from prior hernia surgery, no overlying erythema Back - no CVA tenderness (noted the discomfort at the right CVA region when it occurred), nontender in the lower back muscle region and over the lumbosacral spine Neuro/psychiatric - affect was not flat, appropriate with conversation  Alert   Speech  normal   Urine dip was negative except for slight + glucose (500)  Results for orders placed or performed in visit on 12/27/19  Novel Coronavirus, NAA ([facility])   Specimen: Oropharyngeal(OP) collection in vial transport medium   OROPHARYNGEA  TESTING  Result Value Ref Range   SARS-CoV-2, NAA Not Detected Not Detected       Assessment & Plan:    1. Acute low back pain, unspecified back pain laterality, unspecified whether sciatica present Discussed with patient that this could have been a another episode of a kidney stone, and he seemed quite convinced it was.  He notes that his symptoms have pretty well resolved, and had none yesterday, and question of subtle mild discomfort today, one time and very short-lived.  His urine dip in the office showed no blood, and without concerns for obstruction. He denies any concerning history of kidney problems or disease in his past. Of note, a prior CT scan in 2015 did have a stone noted and showed:  Multiple bilateral renal cysts are identified. Most of these are less than 1 cm and too small to  reliably characterize. The largest is in the inferior pole of the  left kidney and measures 5.7 cm. Stone is identified within the  inferior pole of the left kidney measuring 4 mm, image 37/series 2.   - POCT  Urinalysis Dipstick -result negative except for glucose as noted above. With his symptoms pretty well subsided, feel reasonable to monitor presently.  Stay well-hydrated.  If his symptoms increase again and return, will follow-up, and a repeat urine and discussed at a minimum probably best to get an ultrasound of the kidney, with a low-dose CT scan  the best approach if concerns for a kidney stone exists.  He was in agreement with this approach.  2. Erectile dysfunction, unspecified erectile dysfunction type Discussed there are other options for ED, with Cialis noted as 1, although cannot say 1 is particularly better than another and managing.  He is on a high dose of the Viagra product presently.  The concern is the cost of Cialis is probably much higher than the generic sildenafil he is taking presently He noted he has a follow-up with urology coming up, and will defer to them for other options.  3. Itch -over his scar site on the lower abdomen Can use a 1% hydrocortisone, over-the-counter strength product topically as needed in the short-term to help with the itch.  4. DM Noted the glucose in the urine, and raise concerns for how well controlled his sugars are, although he noted on checking his sugars on a daily basis in the morning, that his numbers have been good.  We will continue to check presently.  Towanda Malkin, MD 01/16/20 2:17 PM

## 2020-02-20 ENCOUNTER — Ambulatory Visit (INDEPENDENT_AMBULATORY_CARE_PROVIDER_SITE_OTHER): Payer: Medicare Other | Admitting: Family Medicine

## 2020-02-20 ENCOUNTER — Encounter: Payer: Self-pay | Admitting: Family Medicine

## 2020-02-20 ENCOUNTER — Other Ambulatory Visit: Payer: Self-pay

## 2020-02-20 VITALS — BP 130/70 | HR 64 | Temp 97.5°F | Resp 16 | Ht 69.0 in | Wt 133.0 lb

## 2020-02-20 DIAGNOSIS — N529 Male erectile dysfunction, unspecified: Secondary | ICD-10-CM

## 2020-02-20 DIAGNOSIS — E785 Hyperlipidemia, unspecified: Secondary | ICD-10-CM

## 2020-02-20 DIAGNOSIS — E1139 Type 2 diabetes mellitus with other diabetic ophthalmic complication: Secondary | ICD-10-CM

## 2020-02-20 DIAGNOSIS — E559 Vitamin D deficiency, unspecified: Secondary | ICD-10-CM

## 2020-02-20 DIAGNOSIS — E1140 Type 2 diabetes mellitus with diabetic neuropathy, unspecified: Secondary | ICD-10-CM

## 2020-02-20 DIAGNOSIS — H42 Glaucoma in diseases classified elsewhere: Secondary | ICD-10-CM

## 2020-02-20 DIAGNOSIS — D72819 Decreased white blood cell count, unspecified: Secondary | ICD-10-CM | POA: Diagnosis not present

## 2020-02-20 DIAGNOSIS — I10 Essential (primary) hypertension: Secondary | ICD-10-CM

## 2020-02-20 DIAGNOSIS — E538 Deficiency of other specified B group vitamins: Secondary | ICD-10-CM | POA: Diagnosis not present

## 2020-02-20 DIAGNOSIS — Z8546 Personal history of malignant neoplasm of prostate: Secondary | ICD-10-CM | POA: Diagnosis not present

## 2020-02-20 DIAGNOSIS — N521 Erectile dysfunction due to diseases classified elsewhere: Secondary | ICD-10-CM | POA: Diagnosis not present

## 2020-02-20 DIAGNOSIS — R21 Rash and other nonspecific skin eruption: Secondary | ICD-10-CM

## 2020-02-20 MED ORDER — HYDROCORTISONE 2.5 % EX CREA: TOPICAL_CREAM | Freq: Two times a day (BID) | CUTANEOUS | 0 refills | Status: DC

## 2020-02-20 MED ORDER — TADALAFIL 20 MG PO TABS: 10.0000 mg | ORAL_TABLET | ORAL | 0 refills | Status: DC | PRN

## 2020-02-20 MED ORDER — METFORMIN HCL ER 500 MG PO TB24: ORAL_TABLET | ORAL | 1 refills | Status: DC

## 2020-02-20 NOTE — Progress Notes (Signed)
Name: Austin Hunt   MRN: [id number]    DOB: 1940/12/04   Date:02/20/2020       Progress Note  Subjective  Chief Complaint  Chief Complaint  Patient presents with  . Diabetes    4 month follow up  . Hypertension  . Hyperlipidemia    HPI  DMII: glucose at homeis around100-120's he has not been skipping meals.Last A1C was done 12/2019 and it was 7.3 %  He denies polyphagia, polydipsia or polyuria. Vision is better since he had glaucoma and cataract surgery.He also has ED and sees Urologist and has rx of Viagra at home but he states it is no longer working for him, we will try switching to Cialis and if it doesn't work Editor, commissioning know.Nocturiausuallyonce per night, sometimes twice .He has been taking Metforminbut off Glipizide for over one year, his A1C has been stable and at goal for him.   Leukopenia: chronic , last two times it was 2.9 , we had discussed referral to hematologist but his weight is stable and he wants to hold off for now   BPH andhistory ofprostate cancer: he is seeing Urologist and denies any obstructive symptoms, except for nocturia about 1-  2 times per night. Last PSA June 2020 was 1.3 Incidental finding of prostate cancer during TURP back in 2015   HTN: patient is off medication, no chest pain or palpitation, he states bp at home has been around 120's/70's, bp is at goal today   Dyslipidemia: he is not on statin therapy, he refuses medication - myalgia in the past, hewastaking Zetia now and last LDL was slightly better but still not at goal. He does not want injectable medication. He has added an otc supplement - Lipguard.   Chronic constipation: taking Amitizaprnbecause it causes diarrhea He has increased water intake , he drinks prune juice prn. He states he has been having bowel movements every two days, no abdominal pain or blood in stools.   SOB with activity: He had a stress test done by Dr. Clayborn Bigness for evaluation of chest pain back  in 11/2018 and results were. He went back in Nov, he states SOB and needs to take breaks but able to walk. Unable to tolerate statin therapy  Normal myocardial perfusion scan no evidence of stress-induced  myocardial ischemia ejection fraction of 64% conclusion negative scan.  Low risk scan  Malnutrition: temporal waiting, lost almost 8 lbs since last year, he is concerned about it, because at one point he had lost down from 14 lbs to 129 lbs at home.  He has been taking Glucerna to help keep his weight up. His weight about 10 years ago was around 155 lbs. At one point in his life he was 200 lbs. We checked multiple labs, only abnormal finding was low WBC, his weight has been stable now in the 130's range since August 2020  and we will continue to monitor   Recurrent rash on groin: seen by Dr. Phillip Heal and needs a refill of hydrocortisone 2.5 % , he states medication works , he uses intermittently when he has the rash but needs a refill today. He denies any rash at this time   Patient Active Problem List   Diagnosis Date Noted  . Prostate cancer ([place]) 02/07/2018  . Cervical pain (neck) 10/13/2016  . 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 Long term current use of systemic steroids 01/07/2016  . Neutropenia ([place]) 01/07/2016  . Anxiety 04/08/2015  .  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 Chronic constipation 04/08/2015  . 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 Failure of erection 04/08/2015  . H/O inguinal hernia repair 04/08/2015  . HLD (hyperlipidemia) 04/08/2015  . 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 LBP (low back pain) 04/08/2015  . 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 Drug intolerance 04/08/2015  . Kidney lump 04/08/2015  . TI (tricuspid incompetence) 04/08/2015  . Unilateral recurrent femoral hernia without obstruction or gangrene 05/23/2014  . Femoral hernia 05/23/2014  . Right inguinal hernia 02/06/2014  . Cataract 01/16/2014  . Glaucoma  01/16/2014  . Reflux esophagitis 06/21/2009  . Coronary atherosclerosis 04/03/2009  . Benign enlargement of prostate 02/11/2009  . Decreased libido 01/12/2008  . Benign essential HTN 09/12/2007    Past Surgical History:  Procedure Laterality Date  . APPENDECTOMY  1954  . BLADDER SURGERY  Oct 2015  . CARDIAC CATHETERIZATION  2002   1 stent  . COLONOSCOPY    . EYE SURGERY  2011, 2013   cataracts  . EYE SURGERY Right May 2016   glaucoma  . HERNIA REPAIR Left 1967  . HERNIA REPAIR Right 05-10-14   Right femoral hernia repair Dr Bary Castilla with PerFix plug, no onlay mesh..   . PHOTOCOAGULATION WITH LASER Right 12/30/2015   Procedure: PHOTOCOAGULATION WITH LASER;  Surgeon: Ronnell Freshwater, MD;  Location: [facility];  Service: Ophthalmology;  Laterality: Right;  DIABETIC - oral meds IVA BLOCK  . PROSTATE SURGERY  Oct 2015    Family History  Problem Relation Age of Onset  . Diabetes Sister   . Diabetes Brother   . Diabetes Sister   . Prostate cancer Neg Hx   . Bladder Cancer Neg Hx     Social History   Tobacco Use  . Smoking status: Never Smoker  . Smokeless tobacco: Never Used  . Tobacco comment: smoking cessation materials not required  Substance Use Topics  . Alcohol use: No     Current Outpatient Medications:  .  ACCU-CHEK SOFTCLIX LANCETS lancets, USE TO TEST TWICE DAILY, Disp: 100 each, Rfl: 5 .  aspirin 81 MG tablet, Take 81 mg by mouth daily. , Disp: , Rfl:  .  blood glucose meter kit and supplies, Dispense based on patient and insurance preference. Use up to four times daily as directed. (FOR ICD-10 E10.9, E11.9)., Disp: 1 each, Rfl: 0 .  brimonidine-timolol (COMBIGAN) 0.2-0.5 % ophthalmic solution, Place 1 drop into both eyes every 12 (twelve) hours., Disp: , Rfl:  .  Cholecalciferol (VITAMIN D) 50 MCG (2000 UT) CAPS, Take 1 capsule (2,000 Units total) by mouth daily., Disp: 30 capsule, Rfl: 0 .  dorzolamide (TRUSOPT) 2 % ophthalmic solution, ,  Disp: , Rfl:  .  ezetimibe (ZETIA) 10 MG tablet, Take 1 tablet (10 mg total) by mouth daily., Disp: 90 tablet, Rfl: 3 .  Garlic 10 MG CAPS, Take 1 capsule by mouth daily. , Disp: , Rfl:  .  glucose blood (ACCU-CHEK ACTIVE STRIPS) test strip, Use as instructed, Disp: 100 each, Rfl: 12 .  hydrocortisone 2.5 % cream, , Disp: , Rfl:  .  latanoprost (XALATAN) 0.005 % ophthalmic solution, , Disp: , Rfl:  .  metFORMIN (GLUCOPHAGE-XR) 500 MG 24 hr tablet, TAKE 2 TABLETS BY MOUTH ONCE EVERY MORNING AND 1 TABLET AT BEDTIME, Disp: 270 tablet, Rfl: 1 .  Multiple Vitamin (MULTIVITAMIN) tablet, Take 1 tablet by mouth daily., Disp: , Rfl:  .  OMEGA-3 FATTY ACIDS PO, Take 1 tablet by mouth daily.,  Disp: , Rfl:  .  sildenafil (VIAGRA) 100 MG tablet, Take 1 tablet (100 mg total) by mouth daily as needed for erectile dysfunction., Disp: 6 tablet, Rfl: 6 .  vitamin C (ASCORBIC ACID) 500 MG tablet, Take 500 mg by mouth daily. , Disp: , Rfl:  .  Zinc Acetate (GALZIN) 50 MG CAPS, Take 1 capsule by mouth 3 (three) times a week., Disp: , Rfl:  .  Cyanocobalamin (B-12) 1000 MCG SUBL, Place 1 tablet under the tongue daily. (Patient not taking: Reported on 01/16/2020), Disp: 30 tablet, Rfl: 0  Allergies  Allergen Reactions  . Colesevelam Other (See Comments)  . Penicillins Itching and Other (See Comments)  . Statins Other (See Comments)    myalgia  . Welchol [Colesevelam Hcl]     I personally reviewed active problem list, medication list, allergies, family history, social history, health maintenance with the patient/caregiver today.   ROS  Constitutional: Negative for fever or weight change.  Respiratory: Negative for cough and shortness of breath.   Cardiovascular: Negative for chest pain or palpitations.  Gastrointestinal: Negative for abdominal pain, no bowel changes.  Musculoskeletal: Negative for gait problem or joint swelling.  Skin: Negative for rash.  Neurological: Negative for dizziness or headache.   No other specific complaints in a complete review of systems (except as listed in HPI above).  Objective  Vitals:   02/20/20 0859  BP: 130/70  Pulse: 64  Resp: 16  Temp: (!) 97.5 F (36.4 C)  TempSrc: Temporal  SpO2: 98%  Weight: 133 lb (60.3 kg)  Height: 5' 9" (1.753 m)    Body mass index is 19.64 kg/m.  Physical Exam  Constitutional: Patient appears well-developed and well-nourished.  No distress.  HEENT: head atraumatic, normocephalic, pupils equal and reactive to light Cardiovascular: Normal rate, regular rhythm and normal heart sounds.  No murmur heard. No BLE edema. Pulmonary/Chest: Effort normal and breath sounds normal. No respiratory distress. Abdominal: Soft.  There is no tenderness. Psychiatric: Patient has a normal mood and affect. behavior is normal. Judgment and thought content normal.   Recent Results (from the past 2160 hour(s))  Lipid panel     Status: Abnormal   Collection Time: 12/15/19 10:35 AM  Result Value Ref Range   Cholesterol 197 <200 mg/dL   HDL 57 > OR = 40 mg/dL   Triglycerides 81 <150 mg/dL   LDL Cholesterol (Calc) 122 (H) mg/dL (calc)    Comment: Reference range: <100 . Desirable range <100 mg/dL for primary prevention;   <70 mg/dL for patients with CHD or diabetic patients  with > or = 2 CHD risk factors. Marland Kitchen LDL-C is now calculated using the Martin-Hopkins  calculation, which is a validated novel method providing  better accuracy than the Friedewald equation in the  estimation of LDL-C.  Cresenciano Genre et al. Annamaria Helling. 3785;885(02): 2061-2068  (http://education.[facility].com/faq/FAQ164)    Total CHOL/HDL Ratio 3.5 <5.0 (calc)   Non-HDL Cholesterol (Calc) 140 (H) <130 mg/dL (calc)    Comment: For patients with diabetes plus 1 major ASCVD risk  factor, treating to a non-HDL-C goal of <100 mg/dL  (LDL-C of <70 mg/dL) is considered a therapeutic  option.   COMPLETE METABOLIC PANEL WITH GFR     Status: Abnormal   Collection Time:  12/15/19 10:35 AM  Result Value Ref Range   Glucose, Bld 139 (H) 65 - 99 mg/dL    Comment: .            Fasting reference interval . For  someone without known diabetes, a glucose value >125 mg/dL indicates that they may have diabetes and this should be confirmed with a follow-up test. .    BUN 16 7 - 25 mg/dL   Creat 0.88 0.70 - 1.11 mg/dL    Comment: For patients >21 years of age, the reference limit for Creatinine is approximately 13% higher for people identified as African-American. .    GFR, Est Non African American 81 > OR = 60 mL/min/1.61m   GFR, Est African American 94 > OR = 60 mL/min/1.775m  BUN/Creatinine Ratio NOT APPLICABLE 6 - 22 (calc)   Sodium 140 135 - 146 mmol/L   Potassium 4.5 3.5 - 5.3 mmol/L   Chloride 103 98 - 110 mmol/L   CO2 31 20 - 32 mmol/L   Calcium 9.7 8.6 - 10.3 mg/dL   Total Protein 6.8 6.1 - 8.1 g/dL   Albumin 4.3 3.6 - 5.1 g/dL   Globulin 2.5 1.9 - 3.7 g/dL (calc)   AG Ratio 1.7 1.0 - 2.5 (calc)   Total Bilirubin 0.5 0.2 - 1.2 mg/dL   Alkaline phosphatase (APISO) 62 35 - 144 U/L   AST 17 10 - 35 U/L   ALT 11 9 - 46 U/L  CBC with Differential/Platelet     Status: Abnormal   Collection Time: 12/15/19 10:35 AM  Result Value Ref Range   WBC 2.9 (L) 3.8 - 10.8 Thousand/uL   RBC 4.83 4.20 - 5.80 Million/uL   Hemoglobin 14.4 13.2 - 17.1 g/dL   HCT 42.9 38.5 - 50.0 %   MCV 88.8 80.0 - 100.0 fL   MCH 29.8 27.0 - 33.0 pg   MCHC 33.6 32.0 - 36.0 g/dL   RDW 11.8 11.0 - 15.0 %   Platelets 218 140 - 400 Thousand/uL   MPV 9.2 7.5 - 12.5 fL   Neutro Abs 1,328 (L) 1,500 - 7,800 cells/uL   Lymphs Abs 1,201 850 - 3,900 cells/uL   Absolute Monocytes 261 200 - 950 cells/uL   Eosinophils Absolute 90 15 - 500 cells/uL   Basophils Absolute 20 0 - 200 cells/uL   Neutrophils Relative % 45.8 %   Total Lymphocyte 41.4 %   Monocytes Relative 9.0 %   Eosinophils Relative 3.1 %   Basophils Relative 0.7 %  Hemoglobin A1c     Status: Abnormal   Collection  Time: 12/15/19 10:35 AM  Result Value Ref Range   Hgb A1c MFr Bld 7.3 (H) <5.7 % of total Hgb    Comment: For someone without known diabetes, a hemoglobin A1c value of 6.5% or greater indicates that they may have  diabetes and this should be confirmed with a follow-up  test. . For someone with known diabetes, a value <7% indicates  that their diabetes is well controlled and a value  greater than or equal to 7% indicates suboptimal  control. A1c targets should be individualized based on  duration of diabetes, age, comorbid conditions, and  other considerations. . Currently, no consensus exists regarding use of hemoglobin A1c for diagnosis of diabetes for children. .    Mean Plasma Glucose 163 (calc)   eAG (mmol/L) 9.0 (calc)  Sedimentation rate     Status: None   Collection Time: 12/15/19 10:35 AM  Result Value Ref Range   Sed Rate CANCELED     Comment: TEST NOT PERFORMED . Please be advised that we received your order for test code 809 - ESR, Automated. Due to insufficient specimen volume the automated ESR  was not performed. The test code was changed to [phone] and a manual ESR was performed. Billing has been changed as appropriate. Please contact us with any questions.  Result canceled by the ancillary.   C-reactive protein     Status: None   Collection Time: 12/15/19 10:35 AM  Result Value Ref Range   CRP 3.0 <8.0 mg/L  VITAMIN D 25 Hydroxy (Vit-D Deficiency, Fractures)     Status: None   Collection Time: 12/15/19 10:35 AM  Result Value Ref Range   Vit D, 25-Hydroxy 61 30 - 100 ng/mL    Comment: Vitamin D Status         25-OH Vitamin D: . Deficiency:                    <20 ng/mL Insufficiency:             20 - 29 ng/mL Optimal:                 > or = 30 ng/mL . For 25-OH Vitamin D testing on patients on  D2-supplementation and patients for whom quantitation  of D2 and D3 fractions is required, the [facility]ure[facility](TM) 25-OH VIT D, (D2,D3), LC/MS/MS is recommended:  order  code [phone] (patients >100yr). See Note 1 . Note 1 . For additional information, please refer to  http://education.[facility].com/faq/FAQ199  (This link is being provided for informational/ educational purposes only.)   TSH     Status: None   Collection Time: 12/15/19 10:35 AM  Result Value Ref Range   TSH 3.08 0.40 - 4.50 mIU/L  Vitamin B12     Status: None   Collection Time: 12/15/19 10:35 AM  Result Value Ref Range   Vitamin B-12 583 200 - 1,100 pg/mL  Sed Rate Manual [place] Rflx     Status: None   Collection Time: 12/15/19 10:35 AM  Result Value Ref Range   SED RATE BY MODIFIED WESTERGREN,MANUAL 8 0 - 20 mm/h  Novel Coronavirus, NAA ([facility])     Status: None   Collection Time: 12/27/19  2:10 PM   Specimen: Oropharyngeal(OP) collection in vial transport medium   OROPHARYNGEA  TESTING  Result Value Ref Range   SARS-CoV-2, NAA Not Detected Not Detected    Comment: This nucleic acid amplification test was developed and its performance characteristics determined by [employer] Nucleic acid amplification tests include RT-PCR and TMA. This test has not been FDA cleared or approved. This test has been authorized by FDA under an Emergency Use Authorization (EUA). This test is only authorized for the duration of time the declaration that circumstances exist justifying the authorization of the emergency use of in vitro diagnostic tests for detection of SARS-CoV-2 virus and/or diagnosis of COVID-19 infection under section 564(b)(1) of the Act, 21 U.S.C. 3440HKV-4(Q (1), unless the authorization is terminated or revoked sooner. When diagnostic testing is negative, the possibility of a false negative result should be considered in the context of a patient's recent exposures and the presence of clinical signs and symptoms consistent with COVID-19. An individual without symptoms of COVID-19 and who is not shedding SARS-CoV-2 virus wo uld expect to have a negative  (not detected) result in this assay.   POCT Urinalysis Dipstick     Status: Abnormal   Collection Time: 01/16/20  2:20 PM  Result Value Ref Range   Color, UA dark yellow    Clarity, UA slightly cloudy    Glucose, UA Positive (A) Negative    Comment: 500  Bilirubin, UA Negative    Ketones, UA Negative    Spec Grav, UA 1.010 1.010 - 1.025   Blood, UA Negative    pH, UA 7.5 5.0 - 8.0   Protein, UA Negative Negative   Urobilinogen, UA 0.2 0.2 or 1.0 E.U./dL   Nitrite, UA Negative    Leukocytes, UA Negative Negative   Appearance Clear    Odor Normal      PHQ2/9: Depression screen [facility] 2/9 02/20/2020 01/16/2020 10/20/2019 09/11/2019 06/20/2019  Decreased Interest 0 0 0 0 0  Down, Depressed, Hopeless 0 0 0 0 0  PHQ - 2 Score 0 0 0 0 0  Altered sleeping 0 0 0 0 0  Tired, decreased energy 0 0 0 0 0  Change in appetite 0 0 0 0 0  Feeling bad or failure about yourself  0 0 0 0 0  Trouble concentrating 0 0 0 0 0  Moving slowly or fidgety/restless 0 0 0 0 0  Suicidal thoughts 0 0 0 0 0  PHQ-9 Score 0 0 0 0 0  Difficult doing work/chores Not difficult at all Not difficult at all - Not difficult at all -  Some recent data might be hidden    phq 9 is negative   Fall Risk: Fall Risk  02/20/2020 01/16/2020 10/20/2019 09/11/2019 06/20/2019  Falls in the past year? 0 0 0 0 0  Number falls in past yr: 0 0 0 0 0  Injury with Fall? 0 0 0 0 0  Risk for fall due to : - - - - -  Risk for fall due to: Comment - - - - -  Follow up Falls evaluation completed - - Falls evaluation completed -     Functional Status Survey: Is the patient deaf or have difficulty hearing?: No Does the patient have difficulty seeing, even when wearing glasses/contacts?: No Does the patient have difficulty concentrating, remembering, or making decisions?: No Does the patient have difficulty walking or climbing stairs?: No Does the patient have difficulty dressing or bathing?: No Does the patient have difficulty doing  errands alone such as visiting a doctor's office or shopping?: No    Assessment & Plan  1. Leukopenia, unspecified type  Recheck next visit   2. Dyslipidemia  Continue ezetimibe   3. 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  Dispense: 30 tablet; Refill: 0  4. B12 deficiency  Resume supplementation a few times a week   5. Vitamin D deficiency  Continue supplementation   6. Erectile dysfunction, unspecified erectile dysfunction type  In place of Viagra  - tadalafil (CIALIS) 20 MG tablet; Take 0.5-1 tablets (10-20 mg total) by mouth every other day as needed for erectile dysfunction.  Dispense: 30 tablet; Refill: 0  7. Benign essential HTN  bp is at goal   8. History of prostate cancer  Seeing Urologist   9. 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 Rash  - hydrocortisone 2.5 % cream; Apply topically 2 (two) times daily.  Dispense: 30 g; Refill: 0

## 2020-03-05 ENCOUNTER — Other Ambulatory Visit: Payer: Self-pay

## 2020-03-05 MED ORDER — ACCU-CHEK SOFTCLIX LANCETS MISC: 5 refills | Status: DC

## 2020-03-07 ENCOUNTER — Other Ambulatory Visit: Payer: Self-pay

## 2020-03-07 ENCOUNTER — Ambulatory Visit (INDEPENDENT_AMBULATORY_CARE_PROVIDER_SITE_OTHER): Payer: Medicare Other | Admitting: Podiatry

## 2020-03-07 ENCOUNTER — Encounter: Payer: Self-pay | Admitting: Podiatry

## 2020-03-07 VITALS — Temp 93.3°F

## 2020-03-07 DIAGNOSIS — B351 Tinea unguium: Secondary | ICD-10-CM

## 2020-03-07 DIAGNOSIS — E119 Type 2 diabetes mellitus without complications: Secondary | ICD-10-CM | POA: Diagnosis not present

## 2020-03-07 DIAGNOSIS — M79676 Pain in unspecified toe(s): Secondary | ICD-10-CM | POA: Diagnosis not present

## 2020-03-07 DIAGNOSIS — M2010 Hallux valgus (acquired), unspecified foot: Secondary | ICD-10-CM

## 2020-03-07 NOTE — Progress Notes (Signed)
This patient returns to my office for at risk foot care.  This patient requires this care by a professional since this patient will be at risk due to having type 2 diabetes.   This patient is unable to cut nails himself since the patient cannot reach his nails.These nails are painful walking and wearing shoes.  This patient presents for at risk foot care today.  General Appearance  Alert, conversant and in no acute stress.  Vascular  Dorsalis pedis and posterior tibial  pulses are palpable  bilaterally.  Capillary return is within normal limits  bilaterally. Temperature is within normal limits  bilaterally.  Neurologic  Senn-Weinstein monofilament wire test within normal limits  bilaterally. Muscle power within normal limits bilaterally.  Nails Thick disfigured discolored nails with subungual debris  from hallux to fifth toes bilaterally. No evidence of bacterial infection or drainage bilaterally.  Orthopedic  No limitations of motion  feet .  No crepitus or effusions noted.  No bony pathology or digital deformities noted.  HAV  B/L.  Bony prominence fifth metabase.    Skin  normotropic skin with no porokeratosis noted bilaterally.  No signs of infections or ulcers noted.     Onychomycosis  Pain in right toes  Pain in left toes  Consent was obtained for treatment procedures.   Mechanical debridement of nails 1-5  bilaterally performed with a nail nipper.  Filed with dremel without incident.    Return office visit   4 months                   Told patient to return for periodic foot care and evaluation due to potential at risk complications.   Jaryan Chicoine DPM  

## 2020-03-19 DIAGNOSIS — H401133 Primary open-angle glaucoma, bilateral, severe stage: Secondary | ICD-10-CM | POA: Diagnosis not present

## 2020-03-25 ENCOUNTER — Other Ambulatory Visit: Payer: Self-pay | Admitting: Family Medicine

## 2020-03-25 ENCOUNTER — Telehealth: Payer: Self-pay

## 2020-03-25 DIAGNOSIS — E785 Hyperlipidemia, unspecified: Secondary | ICD-10-CM

## 2020-03-25 NOTE — Telephone Encounter (Signed)
Requested Prescriptions  Pending Prescriptions Disp Refills  . ezetimibe (ZETIA) 10 MG tablet [Pharmacy Med Name: EZETIMIBE 10 MG TAB] 90 tablet 3    Sig: TAKE 1 TABLET BY MOUTH ONCE DAILY     Cardiovascular:  Antilipid - Sterol Transport Inhibitors Failed - 03/25/2020  9:59 AM      Failed - LDL in normal range and within 360 days    LDL Cholesterol (Calc)  Date Value Ref Range Status  12/15/2019 122 (H) mg/dL (calc) Final    Comment:    Reference range: <100 . Desirable range <100 mg/dL for primary prevention;   <70 mg/dL for patients with CHD or diabetic patients  with > or = 2 CHD risk factors. Marland Kitchen LDL-C is now calculated using the Martin-Hopkins  calculation, which is a validated novel method providing  better accuracy than the Friedewald equation in the  estimation of LDL-C.  Cresenciano Genre et al. Annamaria Helling. [id number]): 2061-2068  (http://education.[facility].com/faq/FAQ164)          Passed - Total Cholesterol in normal range and within 360 days    Cholesterol, Total  Date Value Ref Range Status  04/08/2015 243 (H) 100 - 199 mg/dL Final   Cholesterol  Date Value Ref Range Status  12/15/2019 197 <200 mg/dL Final         Passed - HDL in normal range and within 360 days    HDL  Date Value Ref Range Status  12/15/2019 57 > OR = 40 mg/dL Final  04/08/2015 55 >39 mg/dL Final    Comment:    According to ATP-III Guidelines, HDL-C >59 mg/dL is considered a negative risk factor for CHD.          Passed - Triglycerides in normal range and within 360 days    Triglycerides  Date Value Ref Range Status  12/15/2019 81 <150 mg/dL Final         Passed - Valid encounter within last 12 months    Recent Outpatient Visits          1 month ago Leukopenia, unspecified type   [facility] [name], MD   2 months ago Acute low back pain, unspecified back pain laterality, unspecified whether sciatica present   [place], MD   5 months ago Weight loss   [facility] [name], MD   6 months ago Tricuspid valve insufficiency, unspecified etiology   [place], FNP   9 months ago 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

## 2020-03-25 NOTE — Telephone Encounter (Signed)
Copied from [place] [id number]. Topic: General - Inquiry >> Mar 22, 2020  2:07 PM Leim Fabry wrote: Reason for CRM: Patient states when he wears his mask, he has a runny nose and sneezing. When he takes it off, patient states sneezing and runny stops. Patient wants to know if there is any connection to symptoms and mask wearing

## 2020-03-26 NOTE — Telephone Encounter (Signed)
Spoke with patient and informed him per [name] that since he has received both dose of the Covid vaccine that he can limit his mask wearing based on the CDC guidelines and continue to wear in places that require it.  Patient verbalized understanding.

## 2020-04-12 ENCOUNTER — Other Ambulatory Visit: Payer: Self-pay | Admitting: Family Medicine

## 2020-04-12 DIAGNOSIS — N529 Male erectile dysfunction, unspecified: Secondary | ICD-10-CM

## 2020-04-12 DIAGNOSIS — N521 Erectile dysfunction due to diseases classified elsewhere: Secondary | ICD-10-CM

## 2020-04-12 DIAGNOSIS — E1140 Type 2 diabetes mellitus with diabetic neuropathy, unspecified: Secondary | ICD-10-CM

## 2020-04-12 NOTE — Telephone Encounter (Signed)
Requested Prescriptions  Pending Prescriptions Disp Refills  . tadalafil (CIALIS) 20 MG tablet [Pharmacy Med Name: TADALAFIL 20 MG TABLET] 30 tablet 0    Sig: TAKE 0.5-1 TABLET BY MOUTH EVERY OTHER DAY AS NEEDED FOR ERECTILE DYSFUNCTION     Urology: Erectile Dysfunction Agents Passed - 04/12/2020  2:59 PM      Passed - Last BP in normal range    BP Readings from Last 1 Encounters:  02/20/20 130/70         Passed - Valid encounter within last 12 months    Recent Outpatient Visits          1 month ago Leukopenia, unspecified type   [facility] [name], MD   2 months ago Acute low back pain, unspecified back pain laterality, unspecified whether sciatica present   [place], MD   5 months ago Weight loss   [facility] [name], MD   7 months ago Tricuspid valve insufficiency, unspecified etiology   [place], FNP   9 months ago 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

## 2020-04-25 NOTE — Progress Notes (Signed)
04/26/20 12:54 PM   Austin Hunt 1941-10-18 [id number]  Referring provider: [name], MD [address] [place],  [place] [address] Chief Complaint  Patient presents with  . Follow-up    1 year follow-up    HPI: Austin Hunt is a 79 y.o. M w/ hx of prostate cancer returns today for 1 year f/u.   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 No longer on dutasteride. PSA 0.7 on dutasteride.   He continues to have nocturia x2 which is stable.  Minimal bother.  He would like to try Cialis for his erectile dysfunction. Currently on Viagara.    IPSS    Row Name 04/26/20 0900         International Prostate Symptom Score   How often have you had the sensation of not emptying your bladder? Less than half the time     How often have you had to urinate less than every two hours? Less than 1 in 5 times     How often have you found you stopped and started again several times when you urinated? Less than 1 in 5 times     How often have you found it difficult to postpone urination? Not at All     How often have you had a weak urinary stream? Not at All     How often have you had to strain to start urination? Not at All     How many times did you typically get up at night to urinate? 2 Times     Total IPSS Score 6       Quality of Life due to urinary symptoms   If you were to spend the rest of your life with your urinary condition just the way it is now how would you feel about that? Pleased            Score:  1-7 Mild 8-19 Moderate 20-35 Severe  PMH: Past Medical History:  Diagnosis Date  . 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 Balanitis   . BPH (benign prostatic hypertrophy) with urinary obstruction   . 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 Diabetes mellitus without complication ([place])   . Glaucoma   . Headache    rare - 1x/mo  . Nocturia   . Prostate cancer [facility])     Surgical History: Past  Surgical History:  Procedure Laterality Date  . APPENDECTOMY  1954  . BLADDER SURGERY  Oct 2015  . CARDIAC CATHETERIZATION  2002   1 stent  . COLONOSCOPY    . EYE SURGERY  2011, 2013   cataracts  . EYE SURGERY Right May 2016   glaucoma  . HERNIA REPAIR Left 1967  . HERNIA REPAIR Right 05-10-14   Right femoral hernia repair Dr Bary Castilla with PerFix plug, no onlay mesh..   . PHOTOCOAGULATION WITH LASER Right 12/30/2015   Procedure: PHOTOCOAGULATION WITH LASER;  Surgeon: Ronnell Freshwater, MD;  Location: [facility];  Service: Ophthalmology;  Laterality: Right;  DIABETIC - oral meds IVA BLOCK  . PROSTATE SURGERY  Oct 2015    Home Medications:  Allergies as of 04/26/2020      Reactions   Colesevelam Other (See Comments)   Penicillins Itching, Other (See Comments)   Statins Other (See Comments)   myalgia   Welchol [colesevelam Hcl]       Medication List  Accurate as of April 26, 2020 11:59 PM. If you have any questions, ask your nurse or doctor.        ACCU-CHEK ACTIVE STRIPS test strip Generic drug: glucose blood Use as instructed   Accu-Chek Softclix Lancets lancets USE TO TEST TWICE DAILY   aspirin 81 MG tablet Take 81 mg by mouth daily.   B-12 1000 MCG Subl Place 1 tablet under the tongue daily.   blood glucose meter kit and supplies Dispense based on patient and insurance preference. Use up to four times daily as directed. (FOR ICD-10 E10.9, E11.9).   Combigan 0.2-0.5 % ophthalmic solution Generic drug: brimonidine-timolol Place 1 drop into both eyes every 12 (twelve) hours.   dorzolamide 2 % ophthalmic solution Commonly known as: TRUSOPT   ezetimibe 10 MG tablet Commonly known as: ZETIA TAKE 1 TABLET BY MOUTH ONCE DAILY   Galzin 50 MG Caps Generic drug: Zinc Acetate Take 1 capsule by mouth 3 (three) times a week.   Garlic 10 MG Caps Take 1 capsule by mouth daily.   hydrocortisone 2.5 % cream Apply topically 2 (two) times daily.     latanoprost 0.005 % ophthalmic solution Commonly known as: XALATAN   metFORMIN 500 MG 24 hr tablet Commonly known as: GLUCOPHAGE-XR TAKE 2 TABLETS BY MOUTH ONCE EVERY MORNING AND 1 TABLET AT BEDTIME   multivitamin tablet Take 1 tablet by mouth daily.   OMEGA-3 FATTY ACIDS PO Take 1 tablet by mouth daily.   tadalafil 20 MG tablet Commonly known as: CIALIS TAKE 0.5-1 TABLET BY MOUTH EVERY OTHER DAY AS NEEDED FOR ERECTILE DYSFUNCTION   vitamin C 500 MG tablet Commonly known as: ASCORBIC ACID Take 500 mg by mouth daily.   Vitamin D 50 MCG (2000 UT) Caps Take 1 capsule (2,000 Units total) by mouth daily.       Allergies:  Allergies  Allergen Reactions  . Colesevelam Other (See Comments)  . Penicillins Itching and Other (See Comments)  . Statins Other (See Comments)    myalgia  . Welchol [Colesevelam Hcl]     Family History: Family History  Problem Relation Age of Onset  . Diabetes Sister   . Diabetes Brother   . Diabetes Sister   . Prostate cancer Neg Hx   . Bladder Cancer Neg Hx     Social History:  reports that he has never smoked. He has never used smokeless tobacco. He reports that he does not drink alcohol and does not use drugs.   Physical Exam: BP (!) 156/67   Pulse 65   Ht 5' 9"  (1.753 m)   Wt 135 lb (61.2 kg)   BMI 19.94 kg/m   Constitutional:  Alert and oriented, No acute distress. HEENT: [place] AT, moist mucus membranes.  Trachea midline, no masses. Cardiovascular: No clubbing, cyanosis, or edema. Respiratory: Normal respiratory effort, no increased work of breathing. Skin: No rashes, bruises or suspicious lesions. Neurologic: Grossly intact, no focal deficits, moving all 4 extremities. Psychiatric: Normal mood and affect.  Laboratory Data:  Lab Results  Component Value Date   CREATININE 0.88 12/15/2019   Lab Results  Component Value Date   HGBA1C 7.3 (H) 12/15/2019   Assessment & Plan:    1. Prostate cancer  Incidental Gleason 3+3  prostate cancer, PSA has been stable We will continue to follow this annually PSA today, will call with results  2. BPH w/ nocturia Stable nocturia 1-2 times per day Minimal bother from this Behavioral modification  3. Erectile dysfunction  Currently on Viagara  Pt requested hand written prescription for Cialis however PCP already gave him Rx which he forgot about   [facility] [address] [place], [place] [address] [phone]  I, Lucas Mallow, am acting as a scribe for Dr. Hollice Espy,  I have reviewed the above documentation for accuracy and completeness, and I agree with the above.   Hollice Espy, MD

## 2020-04-26 ENCOUNTER — Other Ambulatory Visit: Payer: Self-pay

## 2020-04-26 ENCOUNTER — Encounter: Payer: Self-pay | Admitting: Urology

## 2020-04-26 ENCOUNTER — Ambulatory Visit (INDEPENDENT_AMBULATORY_CARE_PROVIDER_SITE_OTHER): Payer: Medicare Other | Admitting: Urology

## 2020-04-26 ENCOUNTER — Other Ambulatory Visit
Admission: RE | Admit: 2020-04-26 | Discharge: 2020-04-26 | Disposition: A | Discharge status: Home / Self Care | Payer: Medicare Other | Attending: Urology | Admitting: Urology

## 2020-04-26 VITALS — BP 156/67 | HR 65 | Ht 69.0 in | Wt 135.0 lb

## 2020-04-26 DIAGNOSIS — C61 Malignant neoplasm of prostate: Secondary | ICD-10-CM

## 2020-04-26 DIAGNOSIS — N5203 Combined arterial insufficiency and corporo-venous occlusive erectile dysfunction: Secondary | ICD-10-CM | POA: Diagnosis not present

## 2020-04-26 DIAGNOSIS — R351 Nocturia: Secondary | ICD-10-CM

## 2020-04-26 LAB — PSA: Prostatic Specific Antigen: 2.12 ng/mL (ref 0.00–4.00)

## 2020-04-29 ENCOUNTER — Telehealth: Payer: Self-pay | Admitting: *Deleted

## 2020-04-29 NOTE — Telephone Encounter (Addendum)
Left VM with details, asked to return call with any questions.   ----- Message from Hollice Espy, MD sent at 04/28/2020 11:31 AM EDT ----- PSA is ok, 2.12.  See you next year for recheck.  Hollice Espy, MD

## 2020-04-30 ENCOUNTER — Telehealth: Payer: Self-pay | Admitting: *Deleted

## 2020-04-30 NOTE — Telephone Encounter (Signed)
PSA is ok, 2.12.  See you next year for recheck.per Brandon/  Notified patient as instructed, patient pleased. Discussed follow-up appointments, patient agrees

## 2020-05-30 ENCOUNTER — Other Ambulatory Visit: Payer: Self-pay

## 2020-05-30 ENCOUNTER — Ambulatory Visit (INDEPENDENT_AMBULATORY_CARE_PROVIDER_SITE_OTHER): Payer: Medicare Other

## 2020-05-30 VITALS — BP 120/60 | HR 65 | Temp 97.3°F | Resp 16 | Ht 69.0 in | Wt 132.2 lb

## 2020-05-30 DIAGNOSIS — Z0000 Encounter for general adult medical examination without abnormal findings: Secondary | ICD-10-CM

## 2020-05-30 NOTE — Patient Instructions (Addendum)
Austin Hunt , Thank you for taking time to come for your Medicare Wellness Visit. I appreciate your ongoing commitment to your health goals. Please review the following plan we discussed and let me know if I can assist you in the future.   Screening recommendations/referrals: Colonoscopy: no longer required Recommended yearly ophthalmology/optometry visit for glaucoma screening and checkup Recommended yearly dental visit for hygiene and checkup  Vaccinations: Influenza vaccine: done 08/01/19 Pneumococcal vaccine: done 01/22/14 Tdap vaccine: done 11/04/10 Shingles vaccine: Shingrix discussed. Please contact your pharmacy for coverage information.  Covid-19: done 11/22/19 & 12/16/19  Advanced directives: Advance directive discussed with you today. I have provided a copy for you to complete at home and have notarized. Once this is complete please bring a copy in to our office so we can scan it into your chart.  Conditions/risks identified: Keep up the great work!  Next appointment: Follow up in one year for your annual wellness visit.   Preventive Care 58 Years and Older, Male Preventive care refers to lifestyle choices and visits with your health care provider that can promote health and wellness. What does preventive care include?  A yearly physical exam. This is also called an annual well check.  Dental exams once or twice a year.  Routine eye exams. Ask your health care provider how often you should have your eyes checked.  Personal lifestyle choices, including:  Daily care of your teeth and gums.  Regular physical activity.  Eating a healthy diet.  Avoiding tobacco and drug use.  Limiting alcohol use.  Practicing safe sex.  Taking low doses of aspirin every day.  Taking vitamin and mineral supplements as recommended by your health care provider. What happens during an annual well check? The services and screenings done by your health care provider during your annual well  check will depend on your age, overall health, lifestyle risk factors, and family history of disease. Counseling  Your health care provider may ask you questions about your:  Alcohol use.  Tobacco use.  Drug use.  Emotional well-being.  Home and relationship well-being.  Sexual activity.  Eating habits.  History of falls.  Memory and ability to understand (cognition).  Work and work Statistician. Screening  You may have the following tests or measurements:  Height, weight, and BMI.  Blood pressure.  Lipid and cholesterol levels. These may be checked every 5 years, or more frequently if you are over 6 years old.  Skin check.  Lung cancer screening. You may have this screening every year starting at age 44 if you have a 30-pack-year history of smoking and currently smoke or have quit within the past 15 years.  Fecal occult blood test (FOBT) of the stool. You may have this test every year starting at age 47.  Flexible sigmoidoscopy or colonoscopy. You may have a sigmoidoscopy every 5 years or a colonoscopy every 10 years starting at age 89.  Prostate cancer screening. Recommendations will vary depending on your family history and other risks.  Hepatitis C blood test.  Hepatitis B blood test.  Sexually transmitted disease (STD) testing.  Diabetes screening. This is done by checking your blood sugar (glucose) after you have not eaten for a while (fasting). You may have this done every 1-3 years.  Abdominal aortic aneurysm (AAA) screening. You may need this if you are a current or former smoker.  Osteoporosis. You may be screened starting at age 55 if you are at high risk. Talk with your health care provider  about your test results, treatment options, and if necessary, the need for more tests. Vaccines  Your health care provider may recommend certain vaccines, such as:  Influenza vaccine. This is recommended every year.  Tetanus, diphtheria, and acellular  pertussis (Tdap, Td) vaccine. You may need a Td booster every 10 years.  Zoster vaccine. You may need this after age 18.  Pneumococcal 13-valent conjugate (PCV13) vaccine. One dose is recommended after age 64.  Pneumococcal polysaccharide (PPSV23) vaccine. One dose is recommended after age 54. Talk to your health care provider about which screenings and vaccines you need and how often you need them. This information is not intended to replace advice given to you by your health care provider. Make sure you discuss any questions you have with your health care provider. Document Released: 11/15/2015 Document Revised: 07/08/2016 Document Reviewed: 08/20/2015 Elsevier Interactive Patient Education  2017 [place] Prevention in the Home Falls can cause injuries. They can happen to people of all ages. There are many things you can do to make your home safe and to help prevent falls. What can I do on the outside of my home?  Regularly fix the edges of walkways and driveways and fix any cracks.  Remove anything that might make you trip as you walk through a door, such as a raised step or threshold.  Trim any bushes or trees on the path to your home.  Use bright outdoor lighting.  Clear any walking paths of anything that might make someone trip, such as rocks or tools.  Regularly check to see if handrails are loose or broken. Make sure that both sides of any steps have handrails.  Any raised decks and porches should have guardrails on the edges.  Have any leaves, snow, or ice cleared regularly.  Use sand or salt on walking paths during winter.  Clean up any spills in your garage right away. This includes oil or grease spills. What can I do in the bathroom?  Use night lights.  Install grab bars by the toilet and in the tub and shower. Do not use towel bars as grab bars.  Use non-skid mats or decals in the tub or shower.  If you need to sit down in the shower, use a plastic,  non-slip stool.  Keep the floor dry. Clean up any water that spills on the floor as soon as it happens.  Remove soap buildup in the tub or shower regularly.  Attach bath mats securely with double-sided non-slip rug tape.  Do not have throw rugs and other things on the floor that can make you trip. What can I do in the bedroom?  Use night lights.  Make sure that you have a light by your bed that is easy to reach.  Do not use any sheets or blankets that are too big for your bed. They should not hang down onto the floor.  Have a firm chair that has side arms. You can use this for support while you get dressed.  Do not have throw rugs and other things on the floor that can make you trip. What can I do in the kitchen?  Clean up any spills right away.  Avoid walking on wet floors.  Keep items that you use a lot in easy-to-reach places.  If you need to reach something above you, use a strong step stool that has a grab bar.  Keep electrical cords out of the way.  Do not use floor polish or  wax that makes floors slippery. If you must use wax, use non-skid floor wax.  Do not have throw rugs and other things on the floor that can make you trip. What can I do with my stairs?  Do not leave any items on the stairs.  Make sure that there are handrails on both sides of the stairs and use them. Fix handrails that are broken or loose. Make sure that handrails are as long as the stairways.  Check any carpeting to make sure that it is firmly attached to the stairs. Fix any carpet that is loose or worn.  Avoid having throw rugs at the top or bottom of the stairs. If you do have throw rugs, attach them to the floor with carpet tape.  Make sure that you have a light switch at the top of the stairs and the bottom of the stairs. If you do not have them, ask someone to add them for you. What else can I do to help prevent falls?  Wear shoes that:  Do not have high heels.  Have rubber  bottoms.  Are comfortable and fit you well.  Are closed at the toe. Do not wear sandals.  If you use a stepladder:  Make sure that it is fully opened. Do not climb a closed stepladder.  Make sure that both sides of the stepladder are locked into place.  Ask someone to hold it for you, if possible.  Clearly mark and make sure that you can see:  Any grab bars or handrails.  First and last steps.  Where the edge of each step is.  Use tools that help you move around (mobility aids) if they are needed. These include:  Canes.  Walkers.  Scooters.  Crutches.  Turn on the lights when you go into a dark area. Replace any light bulbs as soon as they burn out.  Set up your furniture so you have a clear path. Avoid moving your furniture around.  If any of your floors are uneven, fix them.  If there are any pets around you, be aware of where they are.  Review your medicines with your doctor. Some medicines can make you feel dizzy. This can increase your chance of falling. Ask your doctor what other things that you can do to help prevent falls. This information is not intended to replace advice given to you by your health care provider. Make sure you discuss any questions you have with your health care provider. Document Released: 08/15/2009 Document Revised: 03/26/2016 Document Reviewed: 11/23/2014 Elsevier Interactive Patient Education  2017 [employer].

## 2020-05-30 NOTE — Progress Notes (Signed)
Subjective:   Austin Hunt is a 79 y.o. male who presents for Medicare Annual/Subsequent preventive examination.  Review of Systems     Cardiac Risk Factors include: diabetes mellitus;dyslipidemia;male gender;advanced age (>19mn, >>44women)     Objective:    Today's Vitals   05/30/20 0923  BP: (!) 120/60  Pulse: 65  Resp: 16  Temp: (!) 97.3 F (36.3 C)  TempSrc: Temporal  SpO2: 99%  Weight: 132 lb 3.2 oz (60 kg)  Height: 5' 9"  (1.753 m)   Body mass index is 19.52 kg/m.  Advanced Directives 05/30/2020 05/30/2019 05/24/2018 05/27/2017 01/04/2017 08/31/2016 02/28/2016  Does Patient Have a Medical Advance Directive? No No Yes;No Yes Yes Yes Yes  Type of Advance Directive - - Living will Living [place]  Does patient want to make changes to medical advance directive? - - Yes (MAU/Ambulatory/Procedural Areas - Information given) - - Yes - information given No - Patient declined  Copy of [place]? - - - - No - copy requested No - copy requested No - copy requested  Would patient like information on creating a medical advance directive? Yes (MAU/Ambulatory/Procedural Areas - Information given) Yes (MAU/Ambulatory/Procedural Areas - Information given) - - - - -    Current Medications (verified) Outpatient Encounter Medications as of 05/30/2020  Medication Sig  . Accu-Chek Softclix Lancets lancets USE TO TEST TWICE DAILY  . aspirin 81 MG tablet Take 81 mg by mouth daily.   . blood glucose meter kit and supplies Dispense based on patient and insurance preference. Use up to four times daily as directed. (FOR ICD-10 E10.9, E11.9).  . brimonidine-timolol (COMBIGAN) 0.2-0.5 % ophthalmic solution Place 1 drop into both eyes every 12 (twelve) hours.  . Cholecalciferol (VITAMIN D) 50 MCG (2000 UT) CAPS Take 1 capsule (2,000 Units total) by mouth daily.  . Cyanocobalamin (B-12)  1000 MCG SUBL Place 1 tablet under the tongue daily.  . dorzolamide (TRUSOPT) 2 % ophthalmic solution   . ezetimibe (ZETIA) 10 MG tablet TAKE 1 TABLET BY MOUTH ONCE DAILY  . Garlic 10 MG CAPS Take 1 capsule by mouth daily.   .Marland Kitchenglucose blood (ACCU-CHEK ACTIVE STRIPS) test strip Use as instructed  . hydrocortisone 2.5 % cream Apply topically 2 (two) times daily.  .Marland Kitchenlatanoprost (XALATAN) 0.005 % ophthalmic solution   . Magnesium 100 MG TABS Take by mouth.  . metFORMIN (GLUCOPHAGE-XR) 500 MG 24 hr tablet TAKE 2 TABLETS BY MOUTH ONCE EVERY MORNING AND 1 TABLET AT BEDTIME  . Multiple Vitamin (MULTIVITAMIN) tablet Take 1 tablet by mouth daily.  . OMEGA-3 FATTY ACIDS PO Take 1 tablet by mouth daily.  . tadalafil (CIALIS) 20 MG tablet TAKE 0.5-1 TABLET BY MOUTH EVERY OTHER DAY AS NEEDED FOR ERECTILE DYSFUNCTION  . vitamin C (ASCORBIC ACID) 500 MG tablet Take 500 mg by mouth daily.   . Zinc Acetate (GALZIN) 50 MG CAPS Take 1 capsule by mouth 3 (three) times a week.   No facility-administered encounter medications on file as of 05/30/2020.    Allergies (verified) Colesevelam, Penicillins, Statins, and Welchol [colesevelam hcl]   History: Past Medical History:  Diagnosis Date  . 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 Balanitis   . BPH (benign prostatic hypertrophy) with urinary obstruction   . 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 Diabetes mellitus without complication ([place]   . Glaucoma   . Headache  rare - 1x/mo  . Nocturia   . Prostate cancer [facility])    Past Surgical History:  Procedure Laterality Date  . APPENDECTOMY  1954  . BLADDER SURGERY  Oct 2015  . CARDIAC CATHETERIZATION  2002   1 stent  . COLONOSCOPY    . EYE SURGERY  2011, 2013   cataracts  . EYE SURGERY Right May 2016   glaucoma  . HERNIA REPAIR Left 1967  . HERNIA REPAIR Right 05-10-14   Right femoral hernia repair Dr Bary Castilla with PerFix plug, no onlay mesh..   . PHOTOCOAGULATION WITH LASER Right 12/30/2015   Procedure:  PHOTOCOAGULATION WITH LASER;  Surgeon: Ronnell Freshwater, MD;  Location: [place];  Service: Ophthalmology;  Laterality: Right;  DIABETIC - oral meds IVA BLOCK  . PROSTATE SURGERY  Oct 2015   Family History  Problem Relation Age of Onset  . Diabetes Sister   . Diabetes Brother   . Diabetes Sister   . Prostate cancer Neg Hx   . Bladder Cancer Neg Hx    Social History   Socioeconomic History  . Marital status: Married    Spouse name: Onalee Hua  . Number of children: 4  . Years of education: some college  . Highest education level: 12th grade  Occupational History    Employer: RETIRED  Tobacco Use  . Smoking status: Never Smoker  . Smokeless tobacco: Never Used  . Tobacco comment: smoking cessation materials not required  Vaping Use  . Vaping Use: Never used  Substance and Sexual Activity  . Alcohol use: No  . Drug use: No  . Sexual activity: Yes  Other Topics Concern  . Not on file  Social History Narrative  . Not on file   Social Determinants of Health   Financial Resource Strain: Low Risk   . Difficulty of Paying Living Expenses: Not very hard  Food Insecurity: No Food Insecurity  . Worried About Charity fundraiser in the Last Year: Never true  . Ran Out of Food in the Last Year: Never true  Transportation Needs: No Transportation Needs  . Lack of Transportation (Medical): No  . Lack of Transportation (Non-Medical): No  Physical Activity: Sufficiently Active  . Days of Exercise per Week: 4 days  . Minutes of Exercise per Session: 40 min  Stress: No Stress Concern Present  . Feeling of Stress : Not at all  Social Connections: Socially Integrated  . Frequency of Communication with Friends and Family: More than three times a week  . Frequency of Social Gatherings with Friends and Family: More than three times a week  . Attends Religious Services: More than 4 times per year  . Active Member of Clubs or Organizations: Yes  . Attends Theatre manager Meetings: More than 4 times per year  . Marital Status: Married    Tobacco Counseling Counseling given: Not Answered Comment: smoking cessation materials not required   Clinical Intake:  Pre-visit preparation completed: Yes  Pain : No/denies pain     BMI - recorded: 19.52 Nutritional Risks: None Diabetes: Yes CBG done?: No Did pt. bring in CBG monitor from home?: No  How often do you need to have someone help you when you read instructions, pamphlets, or other written materials from your doctor or pharmacy?: 1 - Never  Nutrition Risk Assessment:  Has the patient had any N/V/D within the last 2 months?  No  Does the patient have any non-healing wounds?  No  Has the  patient had any unintentional weight loss or weight gain?  No   Diabetes:  Is the patient diabetic?  Yes  If diabetic, was a CBG obtained today?  No  Did the patient bring in their glucometer from home?  No  How often do you monitor your CBG's? Daily QAM.   Financial Strains and Diabetes Management:  Are you having any financial strains with the device, your supplies or your medication? No .  Does the patient want to be seen by Chronic Care Management for management of their diabetes?  No  Would the patient like to be referred to a Nutritionist or for Diabetic Management?  No   Diabetic Exams:  Diabetic Eye Exam: Completed 07/20/19 negative retinopathy.  Diabetic Foot Exam: Completed 06/20/19.   Interpreter Needed?: No  Information entered by :: Clemetine Marker LPN   Activities of Daily Living In your present state of health, do you have any difficulty performing the following activities: 05/30/2020 02/20/2020  Hearing? N N  Comment declines hearing aids -  Vision? N N  Difficulty concentrating or making decisions? N N  Walking or climbing stairs? N N  Dressing or bathing? N N  Doing errands, shopping? N N  Preparing Food and eating ? N -  Using the Toilet? N -  In the past six months,  have you accidently leaked urine? N -  Do you have problems with loss of bowel control? N -  Managing your Medications? N -  Managing your Finances? N -  Housekeeping or managing your Housekeeping? N -  Some recent data might be hidden    Patient Care Team: [name], MD as PCP - General (Family Medicine) Bary Castilla, Forest Gleason, MD (General Surgery) Yolonda Kida, MD as Consulting Physician (Cardiology) Anabel Bene, MD as Referring Physician (Neurology) Hollice Espy, MD as Consulting Physician (Urology) Leandrew Koyanagi, MD as Consulting Physician (Ophthalmology) Gardiner Barefoot, DPM as Consulting Physician (Podiatry) Beverly Gust, MD (Otolaryngology)  Indicate any recent Medical Services you may have received from other than [facility] providers in the past year (date may be approximate).     Assessment:   This is a routine wellness examination for Gwyndolyn Saxon.  Hearing/Vision screen  Hearing Screening   125Hz  250Hz  500Hz  1000Hz  2000Hz  3000Hz  4000Hz  6000Hz  8000Hz   Right ear:           Left ear:           Comments: Pt denies hearing difficulty  Vision Screening Comments: Annual vision screenings at [facility]  Dietary issues and exercise activities discussed: Current Exercise Habits: Home exercise routine, Type of exercise: walking, Time (Minutes): 40, Frequency (Times/Week): 4, Weekly Exercise (Minutes/Week): 160, Intensity: Moderate, Exercise limited by: None identified  Goals    .  DIET - INCREASE WATER INTAKE      Recommend to drink at least 6-8 8oz glasses of water per day.    .  Exercise      Starting 08/31/16, I will continue exercising 4 times a week for 40 minutes.    .  I need a constipation medicine that doesn't use animal products (pt-stated)      Current Barriers:  . Chronic constipation . Long time vegetarian, avoids animal products such as gelatin used in many OTCs  . Uses prune juice for constipation, but this gives him  diarrhea  Pharmacist Clinical Goal(s):  Marland Kitchen Over the next 30 days, patient and clinical pharmacist will work together to find a suitable treatment for chronic constipation  that meet's patients lifestyle.   Interventions: . Identify alternative therapies for chronic constipation that are compatible with patient's vegetarian lifestyle and beliefs.   Patient Self Care Activities:  . Calls pharmacy for medication refills . Calls provider office for new concerns or questions  Initial goal documentation     .  My eye drops are expensive (pt-stated)      Current Barriers:  . financial  Pharmacist Clinical Goal(s): Over the next 30 days, Mr.. Stults will provide the necessary supplementary documents (proof of out of pocket prescription expenditure, proof of household income) needed for medication assistance applications to CCM pharmacist pending available programs or foundations.   Interventions: . CCM pharmacist will research for medication assistance program for Combigan, Trusopt, and Xalatan (prescriber: Dr. Leandrew [facility]anagi at [facility])  Patient Self Care Activities:  Marland Kitchen Gather necessary documents needed to apply for medication assistance  Initial goal documentation        Depression Screen PHQ 2/9 Scores 05/30/2020 02/20/2020 01/16/2020 10/20/2019 09/11/2019 06/20/2019 05/30/2019  PHQ - 2 Score 0 0 0 0 0 0 0  PHQ- 9 Score - 0 0 0 0 0 -    Fall Risk Fall Risk  05/30/2020 02/20/2020 01/16/2020 10/20/2019 09/11/2019  Falls in the past year? 0 0 0 0 0  Number falls in past yr: 0 0 0 0 0  Injury with Fall? 0 0 0 0 0  Risk for fall due to : No Fall Risks - - - -  Risk for fall due to: Comment - - - - -  Follow up Falls prevention discussed Falls evaluation completed - - Falls evaluation completed    Any stairs in or around the home? Yes  If so, are there any without handrails? No    Home free of loose throw rugs in walkways, pet beds, electrical cords, etc? Yes  Adequate lighting  in your home to reduce risk of falls? Yes   ASSISTIVE DEVICES UTILIZED TO PREVENT FALLS:  Life alert? No  Use of a cane, walker or w/c? No  Grab bars in the bathroom? No  Shower chair or bench in shower? Yes  Elevated toilet seat or a handicapped toilet? No   TIMED UP AND GO:  Was the test performed? Yes .  Length of time to ambulate 10 feet: 5 sec.   Gait steady and fast without use of assistive device  Cognitive Function:     6CIT Screen 05/30/2020 05/30/2019 05/24/2018 08/31/2016  What Year? 0 points 0 points 0 points 0 points  What month? 0 points 0 points 0 points 0 points  What time? 0 points 0 points 0 points 0 points  Count back from 20 0 points 0 points 0 points 0 points  Months in reverse 0 points 0 points 0 points 0 points  Repeat phrase 0 points 0 points 6 points 0 points  Total Score 0 0 6 0    Immunizations Immunization History  Administered Date(s) Administered  . Fluad Quad(high Dose 65+) 08/01/2019  . Influenza Split 07/02/2010  . Influenza, High Dose Seasonal PF 08/14/2015, 07/30/2016, 08/19/2017, 08/09/2018  . Influenza, Seasonal, Injecte, Preservative Fre 07/08/2011, 07/22/2012  . Influenza,inj,Quad PF,6+ Mos 07/11/2013  . PFIZER SARS-COV-2 Vaccination 11/22/2019, 12/16/2019  . Pneumococcal Conjugate-13 01/22/2014  . Pneumococcal-Unspecified 01/03/2008  . Tdap 11/04/2010  . Zoster 06/14/2008    TDAP status: Up to date   Flu Vaccine status: Up to date   Pneumococcal vaccine status: Up to date   Covid-19 vaccine  status: Completed vaccines  Qualifies for Shingles Vaccine? Yes   Zostavax completed Yes   Shingrix Completed?: No.    Education has been provided regarding the importance of this vaccine. Patient has been advised to call insurance company to determine out of pocket expense if they have not yet received this vaccine. Advised may also receive vaccine at local pharmacy or Health Dept. Verbalized acceptance and understanding.  Screening  Tests Health Maintenance  Topic Date Due  . URINE MICROALBUMIN  06/19/2020  . INFLUENZA VACCINE  06/02/2020  . HEMOGLOBIN A1C  06/13/2020  . FOOT EXAM  06/19/2020  . OPHTHALMOLOGY EXAM  07/19/2020  . TETANUS/TDAP  11/04/2020  . COVID-19 Vaccine  Completed  . PNA vac Low Risk Adult  Completed    Health Maintenance  Health Maintenance Due  Topic Date Due  . URINE MICROALBUMIN  06/19/2020    Colorectal cancer screening: No longer required.   Lung Cancer Screening: (Low Dose CT Chest recommended if Age 40-80 years, 30 pack-year currently smoking OR have quit w/in 15years.) does not qualify.     Additional Screening:  Hepatitis C Screening: no longer required  Vision Screening: Recommended annual ophthalmology exams for early detection of glaucoma and other disorders of the eye. Is the patient up to date with their annual eye exam?  Yes  Who is the provider or what is the name of the office in which the patient attends annual eye exams? [place] Screening: Recommended annual dental exams for proper oral hygiene  Community Resource Referral / Chronic Care Management: CRR required this visit?  No   CCM required this visit?  No      Plan:     I have personally reviewed and noted the following in the patient's chart:   . Medical and social history . Use of alcohol, tobacco or illicit drugs  . Current medications and supplements . Functional ability and status . Nutritional status . Physical activity . Advanced directives . List of other physicians . Hospitalizations, surgeries, and ER visits in previous 12 months . Vitals . Screenings to include cognitive, depression, and falls . Referrals and appointments  In addition, I have reviewed and discussed with patient certain preventive protocols, quality metrics, and best practice recommendations. A written personalized care plan for preventive services as well as general preventive health  recommendations were provided to patient.     Clemetine Marker, LPN   11/24/4823   Nurse Notes: pt doing well and appreciative of visit today

## 2020-06-17 DIAGNOSIS — R221 Localized swelling, mass and lump, neck: Secondary | ICD-10-CM | POA: Diagnosis not present

## 2020-06-17 DIAGNOSIS — S1086XA Insect bite of other specified part of neck, initial encounter: Secondary | ICD-10-CM | POA: Diagnosis not present

## 2020-06-20 NOTE — Progress Notes (Signed)
Name: Austin Hunt   MRN: [id number]    DOB: July 11, 1941   Date:06/21/2020       Progress Note  Subjective  Chief Complaint  Chief Complaint  Patient presents with  . Diabetes  . Hypertension  . Dyslipidemia  . Constipation    1-2 a week, he drinks prune juice or tea to help    HPI  DMII: glucose at homehas been well controlled in the low 100's .Last A1C was done 12/2019 and it was 7.3 %  He denies polyphagia, polydipsia or polyuria. Vision is stable he has glaucoma ( uses eye drops) and cataract surgery.He also has ED and sees Urologist , he is currently on Cialis and is not any better than Viagra, advised to discuss other options with Urologist - pump or injections.He is only on Metformin , he follows a diabetic diet. He is on Zetia since intolerant to statin therapy. He has noticing itching on lateral right foot at night and bothers him, to the point that he has difficulty sleeping   Leukopenia: chronic , last two times it was 2.9 , we had discussed referral to hematologist but his weight is stable and he wants to hold off for now , we will recheck it today   BPH and prostate cancer: he is seeing Urologist and denies any obstructive symptoms, except for nocturiaabout 1-  2 times per night. Incidental finding of prostate cancer during TURP back in 2015 , last PSA was done June 2021 and it was 2.12   History of HTN : patient is off medication, no chest pain or palpitation, he states bp at home has been around120's/80's  Dyslipidemia: he is not on statin therapy, he refuses medication - myalgia in the past, hewastaking Zetia now and last LDL was slightly better but still not at goal. He does not want injectable medication. He has added an otc supplement - Lipguard. We will hold off on labs for now   Chronic constipation: taking Amitizaprnbecause it causes diarrhea He has increased water intake , he drinks prune juice prn. He states he has been having bowel movements a  few times a week , no abdominal pain or blood in stools.   SOB with activity:He had a stress test done by Dr. Clayborn Bigness for evaluation of chest pain back in 11/2018 and results were. He goes back yearly to see Dr. Clayborn Bigness, he states SOB and needs to take breaks but able to walk. On Zetia   Normal myocardial perfusion scan no evidence of stress-induced  myocardial ischemia ejection fraction of 64% conclusion negative scan.  Low risk scan  Recurrent rash on groin: seen by Dr. Phillip Heal and needs a refill of hydrocortisone 2.5 % , he states medication works , he uses intermittently when he has the rash but needs a refill today. He denies any rash at this time   Patient Active Problem List   Diagnosis Date Noted  . Statin intolerance 06/21/2020  . 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 Prostate cancer ([place]) 02/07/2018  . Cervical pain (neck) 10/13/2016  . 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 Long term current use of systemic steroids 01/07/2016  . Neutropenia ([place]) 01/07/2016  . Anxiety 04/08/2015  . 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 Chronic constipation 04/08/2015  . 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 Failure of erection 04/08/2015  . H/O inguinal hernia  repair 04/08/2015  . HLD (hyperlipidemia) 04/08/2015  . 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 LBP (low back pain) 04/08/2015  . 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 Drug intolerance 04/08/2015  . Kidney lump 04/08/2015  . TI (tricuspid incompetence) 04/08/2015  . Unilateral recurrent femoral hernia without obstruction or gangrene 05/23/2014  . Femoral hernia 05/23/2014  . Right inguinal hernia 02/06/2014  . Cataract 01/16/2014  . Glaucoma 01/16/2014  . Reflux esophagitis 06/21/2009  . Coronary atherosclerosis 04/03/2009  . Benign enlargement of prostate 02/11/2009  .  Decreased libido 01/12/2008  . Benign essential HTN 09/12/2007    Past Surgical History:  Procedure Laterality Date  . APPENDECTOMY  1954  . BLADDER SURGERY  Oct 2015  . CARDIAC CATHETERIZATION  2002   1 stent  . COLONOSCOPY    . EYE SURGERY  2011, 2013   cataracts  . EYE SURGERY Right May 2016   glaucoma  . HERNIA REPAIR Left 1967  . HERNIA REPAIR Right 05-10-14   Right femoral hernia repair Dr Bary Castilla with PerFix plug, no onlay mesh..   . PHOTOCOAGULATION WITH LASER Right 12/30/2015   Procedure: PHOTOCOAGULATION WITH LASER;  Surgeon: Ronnell Freshwater, MD;  Location: [facility];  Service: Ophthalmology;  Laterality: Right;  DIABETIC - oral meds IVA BLOCK  . PROSTATE SURGERY  Oct 2015    Family History  Problem Relation Age of Onset  . Diabetes Sister   . Diabetes Brother   . Diabetes Sister   . Prostate cancer Neg Hx   . Bladder Cancer Neg Hx     Social History   Tobacco Use  . Smoking status: Never Smoker  . Smokeless tobacco: Never Used  . Tobacco comment: smoking cessation materials not required  Substance Use Topics  . Alcohol use: No     Current Outpatient Medications:  .  Accu-Chek Softclix Lancets lancets, USE TO TEST TWICE DAILY, Disp: 100 each, Rfl: 5 .  aspirin 81 MG tablet, Take 81 mg by mouth daily. , Disp: , Rfl:  .  blood glucose meter kit and supplies, Dispense based on patient and insurance preference. Use up to four times daily as directed. (FOR ICD-10 E10.9, E11.9)., Disp: 1 each, Rfl: 0 .  brimonidine-timolol (COMBIGAN) 0.2-0.5 % ophthalmic solution, Place 1 drop into both eyes every 12 (twelve) hours., Disp: , Rfl:  .  Cholecalciferol (VITAMIN D) 50 MCG (2000 UT) CAPS, Take 1 capsule (2,000 Units total) by mouth daily., Disp: 30 capsule, Rfl: 0 .  Cyanocobalamin (B-12) 1000 MCG SUBL, Place 1 tablet under the tongue daily., Disp: 30 tablet, Rfl: 0 .  dorzolamide (TRUSOPT) 2 % ophthalmic solution, , Disp: , Rfl:  .  ezetimibe  (ZETIA) 10 MG tablet, TAKE 1 TABLET BY MOUTH ONCE DAILY, Disp: 90 tablet, Rfl: 3 .  gabapentin (NEURONTIN) 100 MG capsule, Take 1-3 capsules (100-300 mg total) by mouth at bedtime. For itching on foot, Disp: 90 capsule, Rfl: 0 .  Garlic 10 MG CAPS, Take 1 capsule by mouth daily. , Disp: , Rfl:  .  glucose blood (ACCU-CHEK ACTIVE STRIPS) test strip, Use as instructed, Disp: 100 each, Rfl: 12 .  hydrocortisone 2.5 % cream, Apply topically 2 (two) times daily., Disp: 30 g, Rfl: 0 .  latanoprost (XALATAN) 0.005 % ophthalmic solution, , Disp: , Rfl:  .  Magnesium 100 MG TABS, Take by mouth., Disp: , Rfl:  .  metFORMIN (GLUCOPHAGE-XR) 500 MG 24 hr tablet, TAKE 2 TABLETS BY MOUTH ONCE EVERY MORNING AND 1  TABLET AT BEDTIME, Disp: 270 tablet, Rfl: 1 .  Multiple Vitamin (MULTIVITAMIN) tablet, Take 1 tablet by mouth daily., Disp: , Rfl:  .  OMEGA-3 FATTY ACIDS PO, Take 1 tablet by mouth daily., Disp: , Rfl:  .  tadalafil (CIALIS) 20 MG tablet, TAKE 0.5-1 TABLET BY MOUTH EVERY OTHER DAY AS NEEDED FOR ERECTILE DYSFUNCTION, Disp: 30 tablet, Rfl: 0 .  vitamin C (ASCORBIC ACID) 500 MG tablet, Take 500 mg by mouth daily. , Disp: , Rfl:  .  Zinc Acetate (GALZIN) 50 MG CAPS, Take 1 capsule by mouth 3 (three) times a week., Disp: , Rfl:   Allergies  Allergen Reactions  . Colesevelam Other (See Comments)  . Penicillins Itching and Other (See Comments)  . Statins Other (See Comments)    myalgia  . Welchol [Colesevelam Hcl]     I personally reviewed active problem list, medication list, allergies, family history, social history, health maintenance, notes from last encounter with the patient/caregiver today.  ROS:   Constitutional: Negative for fever, positive for some  weight change.  Respiratory: Negative for cough and shortness of breath.   Cardiovascular: Negative for chest pain or palpitations.  Gastrointestinal: Negative for abdominal pain, no bowel changes.  Musculoskeletal: Negative for gait problem  or joint swelling.  Skin: positive  For intermittent  rash.  Neurological: Negative for dizziness or headache.  No other specific complaints in a complete review of systems (except as listed in HPI above).  Objective  Vitals:   06/21/20 0820  BP: 124/64  Pulse: 65  SpO2: 99%  Weight: 134 lb (60.8 kg)  Height: 5' 9"  (1.753 m)    Body mass index is 19.79 kg/m.  Physical Exam  Constitutional: Patient appears well-developed and well-nourished. No distress.  HEENT: head atraumatic, normocephalic, pupils equal and reactive to light,  neck supple Cardiovascular: Normal rate, regular rhythm and normal heart sounds.  No murmur heard. No BLE edema. Pulmonary/Chest: Effort normal and breath sounds normal. No respiratory distress. Abdominal: Soft.  There is no tenderness. Psychiatric: Patient has a normal mood and affect. behavior is normal. Judgment and thought content normal.  Recent Results (from the past 2160 hour(s))  PSA     Status: None   Collection Time: 04/26/20 10:36 AM  Result Value Ref Range   Prostatic Specific Antigen 2.12 0.00 - 4.00 ng/mL    Comment: (NOTE) While PSA levels of <=4.0 ng/ml are reported as reference range, some men with levels below 4.0 ng/ml can have prostate cancer and many men with PSA above 4.0 ng/ml do not have prostate cancer.  Other tests such as free PSA, age specific reference ranges, PSA velocity and PSA doubling time may be helpful especially in men less than 63 years old. Performed at [facility], [place] [address], [place], [place] [address]      PHQ2/9: Depression screen [facility] 2/9 06/21/2020 05/30/2020 02/20/2020 01/16/2020 10/20/2019  Decreased Interest 0 0 0 0 0  Down, Depressed, Hopeless 0 0 0 0 0  PHQ - 2 Score 0 0 0 0 0  Altered sleeping 0 - 0 0 0  Tired, decreased energy 0 - 0 0 0  Change in appetite 0 - 0 0 0  Feeling bad or failure about yourself  0 - 0 0 0  Trouble concentrating 0 - 0 0 0  Moving slowly or  fidgety/restless 0 - 0 0 0  Suicidal thoughts 0 - 0 0 0  PHQ-9 Score 0 - 0 0 0  Difficult doing work/chores - - Not difficult at all Not difficult at all -  Some recent data might be hidden    phq 9 is negative   Fall Risk: Fall Risk  06/21/2020 05/30/2020 02/20/2020 01/16/2020 10/20/2019  Falls in the past year? 0 0 0 0 0  Number falls in past yr: - 0 0 0 0  Injury with Fall? - 0 0 0 0  Risk for fall due to : - No Fall Risks - - -  Risk for fall due to: Comment - - - - -  Follow up - Falls prevention discussed Falls evaluation completed - -      Assessment & Plan  1. Dyslipidemia  Continue Zetia   2. Controlled type 2 diabetes with neuropathy (HCC)  - Urine Microalbumin w/creat. ratio - Hemoglobin A1c - gabapentin (NEURONTIN) 100 MG capsule; Take 1-3 capsules (100-300 mg total) by mouth at bedtime. For itching on foot  Dispense: 90 capsule; Refill: 0  3. Benign essential HTN  - CBC with Differential/Platelet - COMPLETE METABOLIC PANEL WITH GFR  4. B12 deficiency  Recheck next visit   5. Prostate cancer [facility])  Keep follow up with Urologist   6. 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 ([place])  Keep follow up with Dr. Cassell Smiles   7. Leukopenia, unspecified type  - CBC with Differential/Platelet  8. 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 Statin intolerance

## 2020-06-21 ENCOUNTER — Ambulatory Visit (INDEPENDENT_AMBULATORY_CARE_PROVIDER_SITE_OTHER): Payer: Medicare Other | Admitting: Family Medicine

## 2020-06-21 ENCOUNTER — Other Ambulatory Visit: Payer: Self-pay

## 2020-06-21 ENCOUNTER — Encounter: Payer: Self-pay | Admitting: Family Medicine

## 2020-06-21 VITALS — BP 124/64 | HR 65 | Ht 69.0 in | Wt 134.0 lb

## 2020-06-21 DIAGNOSIS — E1139 Type 2 diabetes mellitus with other diabetic ophthalmic complication: Secondary | ICD-10-CM | POA: Diagnosis not present

## 2020-06-21 DIAGNOSIS — E1169 Type 2 diabetes mellitus with other specified complication: Secondary | ICD-10-CM | POA: Diagnosis not present

## 2020-06-21 DIAGNOSIS — D72819 Decreased white blood cell count, unspecified: Secondary | ICD-10-CM | POA: Diagnosis not present

## 2020-06-21 DIAGNOSIS — I10 Essential (primary) hypertension: Secondary | ICD-10-CM

## 2020-06-21 DIAGNOSIS — H42 Glaucoma in diseases classified elsewhere: Secondary | ICD-10-CM

## 2020-06-21 DIAGNOSIS — B351 Tinea unguium: Secondary | ICD-10-CM | POA: Diagnosis not present

## 2020-06-21 DIAGNOSIS — C61 Malignant neoplasm of prostate: Secondary | ICD-10-CM

## 2020-06-21 DIAGNOSIS — E1140 Type 2 diabetes mellitus with diabetic neuropathy, unspecified: Secondary | ICD-10-CM | POA: Diagnosis not present

## 2020-06-21 DIAGNOSIS — E538 Deficiency of other specified B group vitamins: Secondary | ICD-10-CM

## 2020-06-21 DIAGNOSIS — N521 Erectile dysfunction due to diseases classified elsewhere: Secondary | ICD-10-CM

## 2020-06-21 DIAGNOSIS — Z789 Other specified health status: Secondary | ICD-10-CM | POA: Insufficient documentation

## 2020-06-21 DIAGNOSIS — E785 Hyperlipidemia, unspecified: Secondary | ICD-10-CM | POA: Diagnosis not present

## 2020-06-21 MED ORDER — GABAPENTIN 100 MG PO CAPS: 100.0000 mg | ORAL_CAPSULE | Freq: Every day | ORAL | 0 refills | Status: DC

## 2020-06-21 NOTE — Patient Instructions (Signed)
Find out if your insurance pays for Shingrix vaccine

## 2020-06-22 LAB — CBC WITH DIFFERENTIAL/PLATELET
Absolute Monocytes: 297 cells/uL (ref 200–950)
Basophils Absolute: 20 cells/uL (ref 0–200)
Basophils Relative: 0.6 %
Eosinophils Absolute: 139 cells/uL (ref 15–500)
Eosinophils Relative: 4.2 %
HCT: 45.3 % (ref 38.5–50.0)
Hemoglobin: 14.9 g/dL (ref 13.2–17.1)
Lymphs Abs: 1178 cells/uL (ref 850–3900)
MCH: 29.4 pg (ref 27.0–33.0)
MCHC: 32.9 g/dL (ref 32.0–36.0)
MCV: 89.5 fL (ref 80.0–100.0)
MPV: 9.5 fL (ref 7.5–12.5)
Monocytes Relative: 9 %
Neutro Abs: 1667 cells/uL (ref 1500–7800)
Neutrophils Relative %: 50.5 %
Platelets: 215 10*3/uL (ref 140–400)
RBC: 5.06 10*6/uL (ref 4.20–5.80)
RDW: 11.9 % (ref 11.0–15.0)
Total Lymphocyte: 35.7 %
WBC: 3.3 10*3/uL — ABNORMAL LOW (ref 3.8–10.8)

## 2020-06-22 LAB — COMPLETE METABOLIC PANEL WITH GFR
AG Ratio: 1.8 (calc) (ref 1.0–2.5)
ALT: 14 U/L (ref 9–46)
AST: 23 U/L (ref 10–35)
Albumin: 4.4 g/dL (ref 3.6–5.1)
Alkaline phosphatase (APISO): 60 U/L (ref 35–144)
BUN: 21 mg/dL (ref 7–25)
CO2: 28 mmol/L (ref 20–32)
Calcium: 9.5 mg/dL (ref 8.6–10.3)
Chloride: 100 mmol/L (ref 98–110)
Creat: 0.95 mg/dL (ref 0.70–1.11)
GFR, Est African American: 87 mL/min/{1.73_m2} (ref >=60)
GFR, Est Non African American: 75 mL/min/{1.73_m2} (ref >=60)
Globulin: 2.4 g/dL (calc) (ref 1.9–3.7)
Glucose, Bld: 124 mg/dL — ABNORMAL HIGH (ref 65–99)
Potassium: 4.5 mmol/L (ref 3.5–5.3)
Sodium: 138 mmol/L (ref 135–146)
Total Bilirubin: 0.6 mg/dL (ref 0.2–1.2)
Total Protein: 6.8 g/dL (ref 6.1–8.1)

## 2020-06-22 LAB — MICROALBUMIN / CREATININE URINE RATIO
Creatinine, Urine: 88 mg/dL (ref 20–320)
Microalb Creat Ratio: 5 mcg/mg creat (ref <30)
Microalb, Ur: 0.4 mg/dL

## 2020-06-26 ENCOUNTER — Other Ambulatory Visit: Payer: Self-pay | Admitting: Family Medicine

## 2020-06-26 DIAGNOSIS — Z23 Encounter for immunization: Secondary | ICD-10-CM

## 2020-06-26 MED ORDER — ZOSTER VAC RECOMB ADJUVANTED 50 MCG/0.5ML IM SUSR: 0.5000 mL | Freq: Once | INTRAMUSCULAR | 1 refills | Status: AC

## 2020-07-11 ENCOUNTER — Encounter: Payer: Self-pay | Admitting: Podiatry

## 2020-07-11 ENCOUNTER — Other Ambulatory Visit: Payer: Self-pay

## 2020-07-11 ENCOUNTER — Ambulatory Visit (INDEPENDENT_AMBULATORY_CARE_PROVIDER_SITE_OTHER): Payer: Medicare Other | Admitting: Podiatry

## 2020-07-11 DIAGNOSIS — E119 Type 2 diabetes mellitus without complications: Secondary | ICD-10-CM | POA: Diagnosis not present

## 2020-07-11 DIAGNOSIS — M79676 Pain in unspecified toe(s): Secondary | ICD-10-CM

## 2020-07-11 DIAGNOSIS — B351 Tinea unguium: Secondary | ICD-10-CM

## 2020-07-11 DIAGNOSIS — M2010 Hallux valgus (acquired), unspecified foot: Secondary | ICD-10-CM

## 2020-07-11 NOTE — Progress Notes (Signed)
This patient returns to my office for at risk foot care.  This patient requires this care by a professional since this patient will be at risk due to having type 2 diabetes.   This patient is unable to cut nails himself since the patient cannot reach his nails.These nails are painful walking and wearing shoes.  This patient presents for at risk foot care today.  General Appearance  Alert, conversant and in no acute stress.  Vascular  Dorsalis pedis and posterior tibial  pulses are palpable  bilaterally.  Capillary return is within normal limits  bilaterally. Temperature is within normal limits  bilaterally.  Neurologic  Senn-Weinstein monofilament wire test within normal limits  bilaterally. Muscle power within normal limits bilaterally.  Nails Thick disfigured discolored nails with subungual debris  from hallux to fifth toes bilaterally. No evidence of bacterial infection or drainage bilaterally.  Orthopedic  No limitations of motion  feet .  No crepitus or effusions noted.  No bony pathology or digital deformities noted.  HAV  B/L.  Bony prominence fifth metabase.    Skin  normotropic skin with no porokeratosis noted bilaterally.  No signs of infections or ulcers noted.     Onychomycosis  Pain in right toes  Pain in left toes  Consent was obtained for treatment procedures.   Mechanical debridement of nails 1-5  bilaterally performed with a nail nipper.  Filed with dremel without incident.    Return office visit   4 months                   Told patient to return for periodic foot care and evaluation due to potential at risk complications.   Gardiner Barefoot DPM

## 2020-07-15 ENCOUNTER — Telehealth: Payer: Self-pay | Admitting: *Deleted

## 2020-07-15 NOTE — Chronic Care Management (AMB) (Signed)
  Chronic Care Management   Note  07/15/2020 Name: Austin Hunt MRN: [id number] DOB: 22-Oct-1941  Austin Hunt is a 79 y.o. year old male who is a primary care patient of [name], MD. I reached out to [name] by phone today in response to a referral sent by [name]'s health plan.     [name] was given information about Chronic Care Management services today including:  1. CCM service includes personalized support from designated clinical staff supervised by his physician, including individualized plan of care and coordination with other care providers 2. 24/7 contact phone numbers for assistance for urgent and routine care needs. 3. Service will only be billed when office clinical staff spend 20 minutes or more in a month to coordinate care. 4. Only one practitioner may furnish and bill the service in a calendar month. 5. The patient may stop CCM services at any time (effective at the end of the month) by phone call to the office staff. 6. The patient will be responsible for cost sharing (co-pay) of up to 20% of the service fee (after annual deductible is met).  Patient agreed to services and verbal consent obtained.   Follow up plan: Face to Face appointment with care management team member scheduled for:  07/24/2020  [facility]

## 2020-07-24 ENCOUNTER — Other Ambulatory Visit: Payer: Self-pay

## 2020-07-24 ENCOUNTER — Ambulatory Visit: Payer: Medicare Other | Admitting: Pharmacist

## 2020-07-24 DIAGNOSIS — E785 Hyperlipidemia, unspecified: Secondary | ICD-10-CM

## 2020-07-24 DIAGNOSIS — IMO0002 Reserved for concepts with insufficient information to code with codable children: Secondary | ICD-10-CM

## 2020-07-24 DIAGNOSIS — E1139 Type 2 diabetes mellitus with other diabetic ophthalmic complication: Secondary | ICD-10-CM

## 2020-07-24 NOTE — Chronic Care Management (AMB) (Signed)
Chronic Care Management Pharmacy  Name: HAMILTON MARINELLO  MRN: [id number] DOB: 04/25/1941  Chief Complaint/ HPI  [name],  79 y.o. , male presents for their Initial CCM visit with the clinical pharmacist via telephone due to COVID-19 Pandemic.  PCP : [name], MD  Their chronic conditions include: HTN, HLD, DM  Office Visits: 8/20 DM, [place], BP 124/64 P 65 Wt 134 BMI 19.8, statin intolerant, Cialis/Viagra not effective, never smoker, Amitiza PRN, EF 64%, malnutrition  Consult Visit: 6/25 nocturia, [place], BP 156/67 P 65 Wt 135 BMI 19.9, d/c Viagra, start Cialis  Medications: Outpatient Encounter Medications as of 07/24/2020  Medication Sig  . Accu-Chek Softclix Lancets lancets USE TO TEST TWICE DAILY  . aspirin 81 MG tablet Take 81 mg by mouth daily.   . blood glucose meter kit and supplies Dispense based on patient and insurance preference. Use up to four times daily as directed. (FOR ICD-10 E10.9, E11.9).  . brimonidine-timolol (COMBIGAN) 0.2-0.5 % ophthalmic solution Place 1 drop into both eyes every 12 (twelve) hours.  . Cholecalciferol (VITAMIN D) 50 MCG (2000 UT) CAPS Take 1 capsule (2,000 Units total) by mouth daily.  . dorzolamide (TRUSOPT) 2 % ophthalmic solution 1 drop 2 (two) times daily.   Marland Kitchen ezetimibe (ZETIA) 10 MG tablet TAKE 1 TABLET BY MOUTH ONCE DAILY  . gabapentin (NEURONTIN) 100 MG capsule Take 1-3 capsules (100-300 mg total) by mouth at bedtime. For itching on foot (Patient taking differently: Take 100-300 mg by mouth at bedtime. For itching on foot Open capsules and empty into applesauce to avoid gelatin)  . Garlic 10 MG CAPS Take 15 mg by mouth daily.   Marland Kitchen glucose blood (ACCU-CHEK ACTIVE STRIPS) test strip Use as instructed  . latanoprost (XALATAN) 0.005 % ophthalmic solution Place 1 drop into both eyes at bedtime.   . metFORMIN (GLUCOPHAGE-XR) 500 MG 24 hr tablet TAKE 2 TABLETS BY MOUTH ONCE EVERY MORNING AND 1 TABLET AT BEDTIME  . Multiple  Vitamin (MULTIVITAMIN) tablet Take 1 tablet by mouth daily.  . OMEGA-3 FATTY ACIDS PO Take 250 mg by mouth daily.   . vitamin C (ASCORBIC ACID) 500 MG tablet Take 500 mg by mouth 2 (two) times daily. Chewable  . Zinc Acetate (GALZIN) 50 MG CAPS Take 1 capsule by mouth daily.   . Cyanocobalamin (B-12) 1000 MCG SUBL Place 1 tablet under the tongue daily. (Patient not taking: Reported on 07/24/2020)  . hydrocortisone 2.5 % cream Apply topically 2 (two) times daily. (Patient not taking: Reported on 07/24/2020)  . Magnesium 100 MG TABS Take by mouth. (Patient not taking: Reported on 07/24/2020)  . tadalafil (CIALIS) 20 MG tablet TAKE 0.5-1 TABLET BY MOUTH EVERY OTHER DAY AS NEEDED FOR ERECTILE DYSFUNCTION (Patient not taking: Reported on 07/25/2020)   No facility-administered encounter medications on file as of 07/24/2020.      Financial Resource Strain: Low Risk   . Difficulty of Paying Living Expenses: Not very hard    Current Diagnosis/Assessment:  Goals Addressed            This Visit's Progress   . Chronic Care Management       CARE PLAN ENTRY (see longitudinal plan of care for additional care plan information)  Current Barriers:  . Chronic Disease Management support, education, and care coordination needs related to Hypertension, Hyperlipidemia, and Diabetes   Hypertension BP Readings from Last 3 Encounters:  06/21/20 124/64  05/30/20 (!) 120/60  04/26/20 (!) 156/67   . Pharmacist  Clinical Goal(s): o Over the next 90 days, patient will work with PharmD and providers to maintain BP goal <130/80 . Current regimen:  o None . Interventions: o None . Patient self care activities - Over the next 90 days, patient will: o Check BP weekly, document, and provide at future appointments o Ensure daily salt intake < 2300 mg/day  Hyperlipidemia Lab Results  Component Value Date/Time   LDLCALC 122 (H) 12/15/2019 10:35 AM   . Pharmacist Clinical Goal(s): o Over the next 90 days,  patient will work with PharmD and providers to achieve LDL goal < 100 . Current regimen:  o Lipgard - dose unknown . Interventions: o Explained that Lipgard is atorvastatin but it's not effective for reasons unknown . Patient self care activities - Over the next 90 days, patient will: o Take Lipgard every day o Consider taking generic atorvastatin to achieve goal  Diabetes Lab Results  Component Value Date/Time   HGBA1C 7.3 (H) 12/15/2019 10:35 AM   HGBA1C 6.9 (A) 06/20/2019 09:13 AM   HGBA1C 7.2 (A) 10/19/2018 02:11 PM   HGBA1C 7.2 10/19/2018 02:11 PM   HGBA1C 7.2 (A) 10/19/2018 02:11 PM   HGBA1C 7.2 (A) 10/19/2018 02:11 PM   HGBA1C 7.4 (H) 05/24/2018 10:54 AM   . Pharmacist Clinical Goal(s): o Over the next 90 days, patient will work with PharmD and providers to maintain A1c goal <8% . Current regimen:  o Metformin XR 526m twice daily . Interventions: o None . Patient self care activities - Over the next 90 days, patient will: o Check blood sugar twice daily, document, and provide at future appointments o Contact provider with any episodes of hypoglycemia  Medication management . Pharmacist Clinical Goal(s): o Over the next 90 days, patient will work with PharmD and providers to achieve optimal medication adherence . Current pharmacy: [name] . Interventions o Comprehensive medication review performed. o Continue current medication management strategy . Patient self care activities - Over the next 90 days, patient will: o Focus on medication adherence by emphasizing pharmaceutical medications with hard evidence over nutritional supplements o Take medications as prescribed o Report any questions or concerns to PharmD and/or provider(s)  Initial goal documentation       Hypertension   BP goal is:  <130/80  Office blood pressures are  BP Readings from Last 3 Encounters:  06/21/20 124/64  05/30/20 (!) 120/60  04/26/20 (!) 156/67   Patient checks BP at home  infrequently Patient home BP readings are ranging: NA  Patient has failed these meds in the past: NA Patient is currently controlled on the following medications:  . None  We discussed: At goal Denies hypotension  Plan  Continue control with diet and exercise   Diabetes   Recent Relevant Labs: Lab Results  Component Value Date/Time   HGBA1C 7.3 (H) 12/15/2019 10:35 AM   HGBA1C 6.9 (A) 06/20/2019 09:13 AM   HGBA1C 7.2 (A) 10/19/2018 02:11 PM   HGBA1C 7.2 10/19/2018 02:11 PM   HGBA1C 7.2 (A) 10/19/2018 02:11 PM   HGBA1C 7.2 (A) 10/19/2018 02:11 PM   HGBA1C 7.4 (H) 05/24/2018 10:54 AM   MICROALBUR 0.4 06/21/2020 09:10 AM   MICROALBUR 0.2 06/20/2019 09:00 AM   MICROALBUR 20 01/04/2017 10:45 AM    Kidney Function Lab Results  Component Value Date/Time   CREATININE 0.95 06/21/2020 09:10 AM   CREATININE 0.88 12/15/2019 10:35 AM   GFRNONAA 75 06/21/2020 09:10 AM   GFRAA 87 06/21/2020 09:10 AM   K 4.5  06/21/2020 09:10 AM   K 4.5 12/15/2019 10:35 AM   K 3.9 08/28/2014 05:16 AM   K 3.9 08/20/2014 02:06 PM    Checking BG: 2x per Day  Recent pre-meal BG readings: 106 - 112 Patient has failed these meds in past: NA Patient is currently controlled on the following medications: metformin XR 578m bid  Last diabetic Foot exam:  Lab Results  Component Value Date/Time   HMDIABEYEEXA No Retinopathy 07/20/2019 12:00 AM    Last diabetic Eye exam:  Lab Results  Component Value Date/Time   HMDIABFOOTEX Abnormal 04/25/2018 12:00 AM   HMDIABFOOTEX Abnormal 04/25/2018 12:00 AM     We discussed:  Micro albumin 88 Low dose ACEI for kidney protection A1c goal < 8% due to age   Plan  Recommend lisinopril 2.526mdaily  Hyperlipidemia   LDL goal < 70  Lipid Panel     Component Value Date/Time   CHOL 197 12/15/2019 1035   CHOL 243 (H) 04/08/2015 0919   TRIG 81 12/15/2019 1035   HDL 57 12/15/2019 1035   HDL 55 04/08/2015 0919   LDLCALC 122 (H) 12/15/2019 1035      Hepatic Function Latest Ref Rng & Units 06/21/2020 12/15/2019 09/11/2019  Total Protein 6.1 - 8.1 g/dL 6.8 6.8 7.0  Albumin 3.6 - 5.1 g/dL - - -  AST 10 - 35 U/L _0 ALT 9 - 46 U/L _1 Alk Phosphatase 40 - 115 U/L - - -  Total Bilirubin 0.2 - 1.2 mg/dL 0.6 0.5 0.5     The ASCVD Risk score (GoForsan et al., 2013) failed to calculate for the following reasons:   The 2013 ASCVD risk score is only valid for ages 4074o 7960 Patient has failed these meds in past: NA Patient is currently uncontrolled on the following medications:  . Lipgard  We discussed: Not at goal Lipgard is atorvastatin, but unsure of adherence  Plan  Continue current medications  Medication Management   Pt uses [name] for all medications Uses pill box? Yes Pt endorses 90% compliance [name] Since 1996  We discussed:  Wants to open up gabapentin capsules, doesn't want meat byproducts Tingling pain R foot SeMichigammeroducts B easy complex  Needs new Rx for hydrocortizone 2.5%  Plan  Continue current medication management strategy  Follow up: 3 month phone visit  TeMilus HeightPharmD, BCSoddy-DaisyCTMinneola Medical [phone]

## 2020-07-25 NOTE — Patient Instructions (Addendum)
Visit Information  Goals Addressed            This Visit's Progress   . Chronic Care Management       CARE PLAN ENTRY (see longitudinal plan of care for additional care plan information)  Current Barriers:  . Chronic Disease Management support, education, and care coordination needs related to Hypertension, Hyperlipidemia, and Diabetes   Hypertension BP Readings from Last 3 Encounters:  06/21/20 124/64  05/30/20 (!) 120/60  04/26/20 (!) 156/67   . Pharmacist Clinical Goal(s): o Over the next 90 days, patient will work with PharmD and providers to maintain BP goal <130/80 . Current regimen:  o None . Interventions: o None . Patient self care activities - Over the next 90 days, patient will: o Check BP weekly, document, and provide at future appointments o Ensure daily salt intake < 2300 mg/day  Hyperlipidemia Lab Results  Component Value Date/Time   LDLCALC 122 (H) 12/15/2019 10:35 AM   . Pharmacist Clinical Goal(s): o Over the next 90 days, patient will work with PharmD and providers to achieve LDL goal < 100 . Current regimen:  o Lipgard - dose unknown . Interventions: o Explained that Lipgard is atorvastatin but it's not effective for reasons unknown . Patient self care activities - Over the next 90 days, patient will: o Take Lipgard every day o Consider taking generic atorvastatin to achieve goal  Diabetes Lab Results  Component Value Date/Time   HGBA1C 7.3 (H) 12/15/2019 10:35 AM   HGBA1C 6.9 (A) 06/20/2019 09:13 AM   HGBA1C 7.2 (A) 10/19/2018 02:11 PM   HGBA1C 7.2 10/19/2018 02:11 PM   HGBA1C 7.2 (A) 10/19/2018 02:11 PM   HGBA1C 7.2 (A) 10/19/2018 02:11 PM   HGBA1C 7.4 (H) 05/24/2018 10:54 AM   . Pharmacist Clinical Goal(s): o Over the next 90 days, patient will work with PharmD and providers to maintain A1c goal <8% . Current regimen:  o Metformin XR 500mg  twice daily . Interventions: o None . Patient self care activities - Over the next 90 days,  patient will: o Check blood sugar twice daily, document, and provide at future appointments o Contact provider with any episodes of hypoglycemia  Medication management . Pharmacist Clinical Goal(s): o Over the next 90 days, patient will work with PharmD and providers to achieve optimal medication adherence . Current pharmacy: [name] . Interventions o Comprehensive medication review performed. o Continue current medication management strategy . Patient self care activities - Over the next 90 days, patient will: o Focus on medication adherence by emphasizing pharmaceutical medications with hard evidence over nutritional supplements o Take medications as prescribed o Report any questions or concerns to PharmD and/or provider(s)  Initial goal documentation        Austin Hunt was given information about Chronic Care Management services today including:  1. CCM service includes personalized support from designated clinical staff supervised by his physician, including individualized plan of care and coordination with other care providers 2. 24/7 contact phone numbers for assistance for urgent and routine care needs. 3. Standard insurance, coinsurance, copays and deductibles apply for chronic care management only during months in which we provide at least 20 minutes of these services. Most insurances cover these services at 100%, however patients may be responsible for any copay, coinsurance and/or deductible if applicable. This service may help you avoid the need for more expensive face-to-face services. 4. Only one practitioner may furnish and bill the service in a calendar month. 5. The patient may  stop CCM services at any time (effective at the end of the month) by phone call to the office staff.  Patient agreed to services and verbal consent obtained.   Print copy of patient instructions provided.  Telephone follow up appointment with pharmacy team member scheduled for: 3  months  Milus Height, PharmD, [place], [facility] [phone]   Dyslipidemia Dyslipidemia is an imbalance of waxy, fat-like substances (lipids) in the blood. The body needs lipids in small amounts. Dyslipidemia often involves a high level of cholesterol or triglycerides, which are types of lipids. Common forms of dyslipidemia include:  High levels of LDL cholesterol. LDL is the type of cholesterol that causes fatty deposits (plaques) to build up in the blood vessels that carry blood away from your heart (arteries).  Low levels of HDL cholesterol. HDL cholesterol is the type of cholesterol that protects against heart disease. High levels of HDL remove the LDL buildup from arteries.  High levels of triglycerides. Triglycerides are a fatty substance in the blood that is linked to a buildup of plaques in the arteries. What are the causes? Primary dyslipidemia is caused by changes (mutations) in genes that are passed down through families (inherited). These mutations cause several types of dyslipidemia. Secondary dyslipidemia is caused by lifestyle choices and diseases that lead to dyslipidemia, such as:  Eating a diet that is high in animal fat.  Not getting enough exercise.  Having diabetes, kidney disease, liver disease, or thyroid disease.  Drinking large amounts of alcohol.  Using certain medicines. What increases the risk? You are more likely to develop this condition if you are an older man or if you are a woman who has gone through menopause. Other risk factors include:  Having a family history of dyslipidemia.  Taking certain medicines, including birth control pills, steroids, some diuretics, and beta-blockers.  Smoking cigarettes.  Eating a high-fat diet.  Having certain medical conditions such as diabetes, polycystic ovary syndrome (PCOS), kidney disease, liver disease, or hypothyroidism.  Not exercising regularly.  Being  overweight or obese with too much belly fat. What are the signs or symptoms? In most cases, dyslipidemia does not usually cause any symptoms. In severe cases, very high lipid levels can cause:  Fatty bumps under the skin (xanthomas).  White or gray ring around the black center (pupil) of the eye. Very high triglyceride levels can cause inflammation of the pancreas (pancreatitis). How is this diagnosed? Your health care provider may diagnose dyslipidemia based on a routine blood test (fasting blood test). Because most people do not have symptoms of the condition, this blood testing (lipid profile) is done on adults age 29 and older and is repeated every 5 years. This test checks:  Total cholesterol. This measures the total amount of cholesterol in your blood, including LDL cholesterol, HDL cholesterol, and triglycerides. A healthy number is below 200.  LDL cholesterol. The target number for LDL cholesterol is different for each person, depending on individual risk factors. Ask your health care provider what your LDL cholesterol should be.  HDL cholesterol. An HDL level of 60 or higher is best because it helps to protect against heart disease. A number below 1 for men or below 34 for women increases the risk for heart disease.  Triglycerides. A healthy triglyceride number is below 150. If your lipid profile is abnormal, your health care provider may do other blood tests. How is this treated? Treatment depends on the type of dyslipidemia that you have and  your other risk factors for heart disease and stroke. Your health care provider will have a target range for your lipid levels based on this information. For many people, this condition may be treated by lifestyle changes, such as diet and exercise. Your health care provider may recommend that you:  Get regular exercise.  Make changes to your diet.  Quit smoking if you smoke. If diet changes and exercise do not help you reach your goals,  your health care provider may also prescribe medicine to lower lipids. The most commonly prescribed type of medicine lowers your LDL cholesterol (statin drug). If you have a high triglyceride level, your provider may prescribe another type of drug (fibrate) or an omega-3 fish oil supplement, or both. Follow these instructions at home:  Eating and drinking  Follow instructions from your health care provider or dietitian about eating or drinking restrictions.  Eat a healthy diet as told by your health care provider. This can help you reach and maintain a healthy weight, lower your LDL cholesterol, and raise your HDL cholesterol. This may include: ? Limiting your calories, if you are overweight. ? Eating more fruits, vegetables, whole grains, fish, and lean meats. ? Limiting saturated fat, trans fat, and cholesterol.  If you drink alcohol: ? Limit how much you use. ? Be aware of how much alcohol is in your drink. In the [place] one drink equals one 12 oz bottle of beer (355 mL), one 5 oz glass of wine (148 mL), or one 1 oz glass of hard liquor (44 mL).  Do not drink alcohol if: ? Your health care provider tells you not to drink. ? You are pregnant, may be pregnant, or are planning to become pregnant. Activity  Get regular exercise. Start an exercise and strength training program as told by your health care provider. Ask your health care provider what activities are safe for you. Your health care provider may recommend: ? 30 minutes of aerobic activity 4-6 days a week. Brisk walking is an example of aerobic activity. ? Strength training 2 days a week. General instructions  Do not use any products that contain nicotine or tobacco, such as cigarettes, e-cigarettes, and chewing tobacco. If you need help quitting, ask your health care provider.  Take over-the-counter and prescription medicines only as told by your health care provider. This includes supplements.  Keep all follow-up visits as  told by your health care provider. Contact a health care provider if:  You are: ? Having trouble sticking to your exercise or diet plan. ? Struggling to quit smoking or control your use of alcohol. Summary  Dyslipidemia often involves a high level of cholesterol or triglycerides, which are types of lipids.  Treatment depends on the type of dyslipidemia that you have and your other risk factors for heart disease and stroke.  For many people, treatment starts with lifestyle changes, such as diet and exercise.  Your health care provider may prescribe medicine to lower lipids. This information is not intended to replace advice given to you by your health care provider. Make sure you discuss any questions you have with your health care provider. Document Revised: 06/13/2018 Document Reviewed: 05/20/2018 Elsevier Patient Education  [place].

## 2020-07-31 DIAGNOSIS — B356 Tinea cruris: Secondary | ICD-10-CM | POA: Diagnosis not present

## 2020-08-04 DIAGNOSIS — Z23 Encounter for immunization: Secondary | ICD-10-CM | POA: Diagnosis not present

## 2020-08-05 NOTE — Progress Notes (Signed)
08/06/2020 10:40 AM   Thereasa Hunt 08-05-1941 [id number]  Referring provider: [name], MD [address] [name],  [place] [address] Chief Complaint  Patient presents with  . Prostatitis    HPI: Austin Hunt is a 79 y.o. male who returns today for an acute visit for issues penile pain and left inguinal pain.  He believes it is related to prostate issues.  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 No longer on dutasteride. PSA 0.7 on dutasteride.   Last visit he continued to have nocturia x2 which is stable. Minimal bother. He wanted to try Cialis for his erectile dysfunction. Currently was on Viagara.   Most recent PSA is 2.12 as of 04/26/2020.   Reports aching pain in his penis. Pain in penis occurred one time lasting approximately 30 minutes and then subsided.  Has not recurred.  He has achy pain in his left groin which occurred about a week ago and is also starting to improve.   He reports a history of right inguinal hernia repair with [name] on 05/10/2014. He did not receive a mesh.    He has ongoing constipation for which he drinks tea to help him go. He notes constipation x 1 week at a time. He will follow up withhis PCP [name] in 09/2020.   PVR 121 mL.    PMH: Past Medical History:  Diagnosis Date  . 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 Balanitis   . BPH (benign prostatic hypertrophy) with urinary obstruction   . 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 Diabetes mellitus without complication ([name])   . Glaucoma   . Headache    rare - 1x/mo  . Nocturia   . Prostate cancer [facility])     Surgical History: Past Surgical History:  Procedure Laterality Date  . APPENDECTOMY  1954  . BLADDER SURGERY  Oct 2015  . CARDIAC CATHETERIZATION  2002   1 stent  . COLONOSCOPY    . EYE SURGERY  2011, 2013   cataracts  . EYE SURGERY Right May 2016   glaucoma  . HERNIA REPAIR Left 1967  .  HERNIA REPAIR Right 05-10-14   Right femoral hernia repair Dr Bary Castilla with PerFix plug, no onlay mesh..   . PHOTOCOAGULATION WITH LASER Right 12/30/2015   Procedure: PHOTOCOAGULATION WITH LASER;  Surgeon: Ronnell Freshwater, MD;  Location: [facility];  Service: Ophthalmology;  Laterality: Right;  DIABETIC - oral meds IVA BLOCK  . PROSTATE SURGERY  Oct 2015    Home Medications:  Allergies as of 08/06/2020      Reactions   Colesevelam Other (See Comments)   Penicillins Itching, Other (See Comments)   Statins Other (See Comments)   myalgia   Welchol [colesevelam Hcl]       Medication List       Accurate as of August 06, 2020 10:40 AM. If you have any questions, ask your nurse or doctor.        ACCU-CHEK ACTIVE STRIPS test strip Generic drug: glucose blood Use as instructed   Accu-Chek Softclix Lancets lancets USE TO TEST TWICE DAILY   aspirin 81 MG tablet Take 81 mg by mouth daily.   B-12 1000 MCG Subl Place 1 tablet under the tongue daily.   blood glucose meter kit and supplies Dispense based on patient and insurance preference. Use up to four times daily as directed. (FOR ICD-10  E10.9, E11.9).   Combigan 0.2-0.5 % ophthalmic solution Generic drug: brimonidine-timolol Place 1 drop into both eyes every 12 (twelve) hours.   dorzolamide 2 % ophthalmic solution Commonly known as: TRUSOPT 1 drop 2 (two) times daily.   ezetimibe 10 MG tablet Commonly known as: ZETIA TAKE 1 TABLET BY MOUTH ONCE DAILY   gabapentin 100 MG capsule Commonly known as: NEURONTIN Take 1-3 capsules (100-300 mg total) by mouth at bedtime. For itching on foot What changed: additional instructions   Galzin 50 MG Caps Generic drug: Zinc Acetate Take 1 capsule by mouth daily.   Garlic 10 MG Caps Take 15 mg by mouth daily.   hydrocortisone 2.5 % cream Apply topically 2 (two) times daily.   ketoconazole 2 % cream Commonly known as: NIZORAL SMARTSIG:1 Application Topical 1 to 2  Times Daily   latanoprost 0.005 % ophthalmic solution Commonly known as: XALATAN Place 1 drop into both eyes at bedtime.   Magnesium 100 MG Tabs Take by mouth.   metFORMIN 500 MG 24 hr tablet Commonly known as: GLUCOPHAGE-XR TAKE 2 TABLETS BY MOUTH ONCE EVERY MORNING AND 1 TABLET AT BEDTIME   multivitamin tablet Take 1 tablet by mouth daily.   OMEGA-3 FATTY ACIDS PO Take 250 mg by mouth daily.   tadalafil 20 MG tablet Commonly known as: CIALIS TAKE 0.5-1 TABLET BY MOUTH EVERY OTHER DAY AS NEEDED FOR ERECTILE DYSFUNCTION   triamcinolone 0.025 % cream Commonly known as: KENALOG Apply topically.   vitamin C 500 MG tablet Commonly known as: ASCORBIC ACID Take 500 mg by mouth 2 (two) times daily. Chewable   Vitamin D 50 MCG (2000 UT) Caps Take 1 capsule (2,000 Units total) by mouth daily.       Allergies:  Allergies  Allergen Reactions  . Colesevelam Other (See Comments)  . Penicillins Itching and Other (See Comments)  . Statins Other (See Comments)    myalgia  . Welchol [Colesevelam Hcl]     Family History: Family History  Problem Relation Age of Onset  . Diabetes Sister   . Diabetes Brother   . Diabetes Sister   . Prostate cancer Neg Hx   . Bladder Cancer Neg Hx     Social History:  reports that he has never smoked. He has never used smokeless tobacco. He reports that he does not drink alcohol and does not use drugs.   Physical Exam: BP (!) 147/71 (BP Location: Left Arm, Patient Position: Sitting, Cuff Size: Normal)   Pulse 70   Ht 5' 9" (1.753 m)   Wt 133 lb (60.3 kg)   BMI 19.64 kg/m   Constitutional:  Alert and oriented, No acute distress. HEENT: [place] AT, moist mucus membranes.  Trachea midline, no masses. Cardiovascular: No clubbing, cyanosis, or edema. Respiratory: Normal respiratory effort, no increased work of breathing. GI: Abdomen is soft, nontender, nondistended, no abdominal masses GU: No CVA tenderness. Left testicle smaller than right.   No appreciable inguinal hernia.  Minimal tenderness over the left cord/inguinal canal. Skin: No rashes, bruises or suspicious lesions. Neurologic: Grossly intact, no focal deficits, moving all 4 extremities. Psychiatric: Normal mood and affect.  Laboratory Data:  Lab Results  Component Value Date   CREATININE 0.95 06/21/2020   Lab Results  Component Value Date   HGBA1C 7.3 (H) 12/15/2019    Urinalysis Negative  Pertinent Imaging: Results for orders placed or performed in visit on 08/06/20  Bladder Scan (Post Void Residual) in office  Result Value Ref Range  Scan Result 132m      Assessment & Plan:    1. Right groin pain  History of right inguinal hernia s/p repair in 2015 with Dr. BBary Castilla Exam today is unremarkable, no obvious hernia recurrence Overall is improving, suspect possible muscular groin strain Supportive care  2. Penile pain Isolated event which has resolved UA is negative Advise patient to follow up with PCP regarding constipation.   3. Incomplete bladder emptying Mildly elevated PVR 121 mL. Will continue to monitor, relatively asymptomatic  Follow-up as previously scheduled   [address] [place] [phone] I, ASelena Batten am acting as a scribe for Dr. AHollice Espy  I have reviewed the above documentation for accuracy and completeness, and I agree with the above.   AHollice Espy MD

## 2020-08-06 ENCOUNTER — Ambulatory Visit (INDEPENDENT_AMBULATORY_CARE_PROVIDER_SITE_OTHER): Payer: Medicare Other | Admitting: Urology

## 2020-08-06 ENCOUNTER — Other Ambulatory Visit: Payer: Self-pay

## 2020-08-06 ENCOUNTER — Encounter: Payer: Self-pay | Admitting: Urology

## 2020-08-06 VITALS — BP 147/71 | HR 70 | Ht 69.0 in | Wt 133.0 lb

## 2020-08-06 DIAGNOSIS — N4281 Prostatodynia syndrome: Secondary | ICD-10-CM

## 2020-08-06 DIAGNOSIS — N4889 Other specified disorders of penis: Secondary | ICD-10-CM

## 2020-08-06 DIAGNOSIS — R1032 Left lower quadrant pain: Secondary | ICD-10-CM | POA: Diagnosis not present

## 2020-08-06 LAB — URINALYSIS, COMPLETE
Bilirubin, UA: NEGATIVE
Ketones, UA: NEGATIVE
Leukocytes,UA: NEGATIVE
Nitrite, UA: NEGATIVE
Protein,UA: NEGATIVE
RBC, UA: NEGATIVE
Specific Gravity, UA: 1.02 (ref 1.005–1.030)
Urobilinogen, Ur: 0.2 mg/dL (ref 0.2–1.0)
pH, UA: 7 (ref 5.0–7.5)

## 2020-08-06 LAB — MICROSCOPIC EXAMINATION
Bacteria, UA: NONE SEEN
RBC, Urine: NONE SEEN /hpf (ref 0–2)

## 2020-08-06 LAB — BLADDER SCAN AMB NON-IMAGING

## 2020-08-09 DIAGNOSIS — H401133 Primary open-angle glaucoma, bilateral, severe stage: Secondary | ICD-10-CM | POA: Diagnosis not present

## 2020-08-09 LAB — HM DIABETES EYE EXAM

## 2020-08-19 ENCOUNTER — Ambulatory Visit (INDEPENDENT_AMBULATORY_CARE_PROVIDER_SITE_OTHER): Payer: Medicare Other

## 2020-08-19 ENCOUNTER — Other Ambulatory Visit: Payer: Self-pay

## 2020-08-19 DIAGNOSIS — Z23 Encounter for immunization: Secondary | ICD-10-CM | POA: Diagnosis not present

## 2020-08-23 ENCOUNTER — Ambulatory Visit: Payer: Self-pay

## 2020-08-23 DIAGNOSIS — E1139 Type 2 diabetes mellitus with other diabetic ophthalmic complication: Secondary | ICD-10-CM

## 2020-08-23 DIAGNOSIS — H42 Glaucoma in diseases classified elsewhere: Secondary | ICD-10-CM

## 2020-08-23 MED ORDER — BLOOD GLUCOSE METER KIT: PACK | 0 refills | Status: DC

## 2020-08-23 NOTE — Telephone Encounter (Signed)
Pt. Reports his glucometer is "old and I get readings all up and down."  Pt. Feels well, no symptoms.Request a new meter and supplies if possible. Please advise pt.

## 2020-08-23 NOTE — Addendum Note (Signed)
Addended by: [name] F on: 08/23/2020 05:07 PM   Modules accepted: Orders

## 2020-09-12 ENCOUNTER — Other Ambulatory Visit: Payer: Self-pay | Admitting: Family Medicine

## 2020-09-18 ENCOUNTER — Telehealth (INDEPENDENT_AMBULATORY_CARE_PROVIDER_SITE_OTHER): Payer: Medicare Other | Admitting: Internal Medicine

## 2020-09-18 ENCOUNTER — Encounter: Payer: Self-pay | Admitting: Internal Medicine

## 2020-09-18 ENCOUNTER — Other Ambulatory Visit: Payer: Self-pay

## 2020-09-18 DIAGNOSIS — K5900 Constipation, unspecified: Secondary | ICD-10-CM | POA: Diagnosis not present

## 2020-09-18 DIAGNOSIS — R519 Headache, unspecified: Secondary | ICD-10-CM | POA: Diagnosis not present

## 2020-09-18 NOTE — Progress Notes (Signed)
Name: Austin Hunt   MRN: [id number]    DOB: 1940-12-17   Date:09/18/2020       Progress Note  Subjective  Chief Complaint  Chief Complaint  Patient presents with  . Headache  . Constipation    I connected with  Austin Hunt on 09/18/20 at 11:00 AM EST by telephone and verified that I am speaking with the correct person using two identifiers.  I discussed the limitations, risks, security and privacy concerns of performing an evaluation and management service by telephone and the availability of in person appointments. The patient expressed understanding and agreed to proceed. Staff also discussed with the patient that there may be a patient responsible charge related to this service. Patient Location: Home Provider Location: [facility] Additional Individuals present: none Attempted to call mobile phone and received VM X 2 at 10:56 am Then attempted home phone and received VM  HPI Patient is an 79 year old male patient of Dr. Ancil Hunt Last visit with her was in August 2021. Follows up today with a phone visit with the above complaints. Noted the marked limitations with this being a phone visit.  On Dr. Ancil Hunt last visit, she did note the following: Chronic constipation: taking Amitizaprnbecause it causes diarrhea He has increased water intake , he drinks prune juice prn. He states he has been having bowel movements a few times a week , no abdominal pain or blood in stools.  Today he noted head had been sore on the top of it, had a mild HA and first noted the symptoms on Monday and noted each day has been better and after going to the bathroom this morning, denied any soreness or HA.  He notes feels much better presently. Drinking lemon juice to help and thinks helped (noted it helped him get rid of his kidney stones in the past) Had a concussion in 2009 after fell on ice, and informed him I do not think this is related to that.  Also notes intermittent constipation, not have BM  since Saturday, no abdominal pain, no N/V. Not causing sx's. Drank prune juice this am and had a BM this morning, normal, no blood, not dark or black.  Staying hydrated noted to be important  Was given Amitiza to use in the past and not taken in past 4-5 years, as he noted has not needed to.   Patient Active Problem List   Diagnosis Date Noted  . Statin intolerance 06/21/2020  . 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 ([place]) 06/21/2020  . Prostate cancer ([place]) 02/07/2018  . Cervical pain (neck) 10/13/2016  . 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 Long term current use of systemic steroids 01/07/2016  . Neutropenia ([place]) 01/07/2016  . Anxiety 04/08/2015  . 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 Chronic constipation 04/08/2015  . 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 Failure of erection 04/08/2015  . H/O inguinal hernia repair 04/08/2015  . HLD (hyperlipidemia) 04/08/2015  . 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 LBP (low back pain) 04/08/2015  . 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 Drug intolerance 04/08/2015  . Kidney lump 04/08/2015  . TI (tricuspid incompetence) 04/08/2015  . Unilateral recurrent femoral hernia without obstruction or gangrene 05/23/2014  . Femoral hernia 05/23/2014  . Right inguinal hernia 02/06/2014  . Cataract 01/16/2014  . Glaucoma 01/16/2014  . Reflux esophagitis 06/21/2009  . Coronary atherosclerosis 04/03/2009  . Benign  enlargement of prostate 02/11/2009  . Decreased libido 01/12/2008  . Benign essential HTN 09/12/2007    Past Surgical History:  Procedure Laterality Date  . APPENDECTOMY  1954  . BLADDER SURGERY  Oct 2015  . CARDIAC CATHETERIZATION  2002   1 stent  . COLONOSCOPY    . EYE SURGERY  2011, 2013   cataracts  . EYE SURGERY Right May 2016   glaucoma  . HERNIA REPAIR Left 1967  . HERNIA REPAIR Right 05-10-14   Right  femoral hernia repair Dr Bary Castilla with PerFix plug, no onlay mesh..   . PHOTOCOAGULATION WITH LASER Right 12/30/2015   Procedure: PHOTOCOAGULATION WITH LASER;  Surgeon: Ronnell Freshwater, MD;  Location: [place];  Service: Ophthalmology;  Laterality: Right;  DIABETIC - oral meds IVA BLOCK  . PROSTATE SURGERY  Oct 2015    Family History  Problem Relation Age of Onset  . Diabetes Sister   . Diabetes Brother   . Diabetes Sister   . Prostate cancer Neg Hx   . Bladder Cancer Neg Hx     Social History   Tobacco Use  . Smoking status: Never Smoker  . Smokeless tobacco: Never Used  . Tobacco comment: smoking cessation materials not required  Substance Use Topics  . Alcohol use: No     Current Outpatient Medications:  .  Accu-Chek Softclix Lancets lancets, TEST 4 TIMES DAILY, Disp: 100 each, Rfl: 5 .  aspirin 81 MG tablet, Take 81 mg by mouth daily. , Disp: , Rfl:  .  blood glucose meter kit and supplies, Dispense based on patient and insurance preference. Use up to four times daily as directed. (FOR ICD-10 E10.9, E11.9)., Disp: 1 each, Rfl: 0 .  Blood Glucose Monitoring Suppl (ACCU-CHEK GUIDE ME) w/Device KIT, , Disp: , Rfl:  .  brimonidine-timolol (COMBIGAN) 0.2-0.5 % ophthalmic solution, Place 1 drop into both eyes every 12 (twelve) hours., Disp: , Rfl:  .  Cholecalciferol (VITAMIN D) 50 MCG (2000 UT) CAPS, Take 1 capsule (2,000 Units total) by mouth daily., Disp: 30 capsule, Rfl: 0 .  Cyanocobalamin (B-12) 1000 MCG SUBL, Place 1 tablet under the tongue daily., Disp: 30 tablet, Rfl: 0 .  dorzolamide (TRUSOPT) 2 % ophthalmic solution, 1 drop 2 (two) times daily. , Disp: , Rfl:  .  ezetimibe (ZETIA) 10 MG tablet, TAKE 1 TABLET BY MOUTH ONCE DAILY, Disp: 90 tablet, Rfl: 3 .  gabapentin (NEURONTIN) 100 MG capsule, Take 1-3 capsules (100-300 mg total) by mouth at bedtime. For itching on foot (Patient taking differently: Take 100-300 mg by mouth at bedtime. For itching on  foot Open capsules and empty into applesauce to avoid gelatin), Disp: 90 capsule, Rfl: 0 .  Garlic 10 MG CAPS, Take 15 mg by mouth daily. , Disp: , Rfl:  .  glucose blood (ACCU-CHEK ACTIVE STRIPS) test strip, Use as instructed, Disp: 100 each, Rfl: 12 .  hydrocortisone 2.5 % cream, Apply topically 2 (two) times daily., Disp: 30 g, Rfl: 0 .  ketoconazole (NIZORAL) 2 % cream, SMARTSIG:1 Application Topical 1 to 2 Times Daily, Disp: , Rfl:  .  latanoprost (XALATAN) 0.005 % ophthalmic solution, Place 1 drop into both eyes at bedtime. , Disp: , Rfl:  .  Magnesium 100 MG TABS, Take by mouth. , Disp: , Rfl:  .  metFORMIN (GLUCOPHAGE-XR) 500 MG 24 hr tablet, TAKE 2 TABLETS BY MOUTH ONCE EVERY MORNING AND 1 TABLET AT BEDTIME, Disp: 270 tablet, Rfl: 1 .  Multiple Vitamin (  MULTIVITAMIN) tablet, Take 1 tablet by mouth daily., Disp: , Rfl:  .  OMEGA-3 FATTY ACIDS PO, Take 250 mg by mouth daily. , Disp: , Rfl:  .  tadalafil (CIALIS) 20 MG tablet, TAKE 0.5-1 TABLET BY MOUTH EVERY OTHER DAY AS NEEDED FOR ERECTILE DYSFUNCTION, Disp: 30 tablet, Rfl: 0 .  triamcinolone (KENALOG) 0.025 % cream, Apply topically., Disp: , Rfl:  .  vitamin C (ASCORBIC ACID) 500 MG tablet, Take 500 mg by mouth 2 (two) times daily. Chewable, Disp: , Rfl:  .  Zinc Acetate (GALZIN) 50 MG CAPS, Take 1 capsule by mouth daily. , Disp: , Rfl:   Allergies  Allergen Reactions  . Colesevelam Other (See Comments)  . Penicillins Itching and Other (See Comments)  . Statins Other (See Comments)    myalgia  . Welchol [Colesevelam Hcl]     With staff assistance, above reviewed with the patient today.  ROS: As per HPI, otherwise no specific complaints on a limited and focused system review   Objective  Virtual encounter, vitals not obtained.  There is no height or weight on file to calculate BMI.  Physical Exam   Appears in NAD via conversation, very pleasant Breathing: No obvious respiratory distress. Speaking in complete sentences  Neurological: Pt is alert, Speech is normal Psychiatric: Patient has a normal mood and affect, behavior is normal. Judgment and thought content normal.   No results found for this or any previous visit (from the past 72 hour(s)).  PHQ2/9: Depression screen [facility] 2/9 09/18/2020 06/21/2020 05/30/2020 02/20/2020 01/16/2020  Decreased Interest 0 0 0 0 0  Down, Depressed, Hopeless 0 0 0 0 0  PHQ - 2 Score 0 0 0 0 0  Altered sleeping - 0 - 0 0  Tired, decreased energy - 0 - 0 0  Change in appetite - 0 - 0 0  Feeling bad or failure about yourself  - 0 - 0 0  Trouble concentrating - 0 - 0 0  Moving slowly or fidgety/restless - 0 - 0 0  Suicidal thoughts - 0 - 0 0  PHQ-9 Score - 0 - 0 0  Difficult doing work/chores - - - Not difficult at all Not difficult at all  Some recent data might be hidden   PHQ-2/9 Result reviewed  Fall Risk: Fall Risk  09/18/2020 06/21/2020 05/30/2020 02/20/2020 01/16/2020  Falls in the past year? 0 0 0 0 0  Number falls in past yr: 0 - 0 0 0  Injury with Fall? 0 - 0 0 0  Risk for fall due to : - - No Fall Risks - -  Risk for fall due to: Comment - - - - -  Follow up - - Falls prevention discussed Falls evaluation completed -     Assessment & Plan  1. Constipation, unspecified constipation type Patient has a history of constipation, and had a bowel movement shortly before our conversation, after not having 1 since the weekend.  Was not having significant symptoms of concern.  He noted he drank prune juice this morning which seemed to help. Emphasized trying to stay well-hydrated, and continuing to monitor presently.  Would not add any medications at this point.  May consider a stool softener over time or other entities pending his status.  2. Acute nonintractable headache, unspecified headache type He noted the head soreness/pain he was having has in essence resolved. Continue to monitor. I reassured him that I do not think it was related to that distant prior  concussion episode.  I discussed the assessment and treatment plan with the patient. The patient was provided an opportunity to ask questions and all were answered. The patient agreed with the plan and demonstrated an understanding of the instructions.  The patient was advised to call back or seek an in-person evaluation if the symptoms worsen or if the condition fails to improve as anticipated.  I provided 15 minutes of non-face-to-face time during this encounter that included discussing at length patient's sx/history, pertinent pmhx, medications, treatment and follow up plan. This time also included the necessary documentation, orders, and chart review.  Towanda Malkin, MD

## 2020-09-20 DIAGNOSIS — I2510 Atherosclerotic heart disease of native coronary artery without angina pectoris: Secondary | ICD-10-CM | POA: Diagnosis not present

## 2020-09-20 DIAGNOSIS — I208 Other forms of angina pectoris: Secondary | ICD-10-CM | POA: Diagnosis not present

## 2020-09-20 DIAGNOSIS — Z955 Presence of coronary angioplasty implant and graft: Secondary | ICD-10-CM | POA: Diagnosis not present

## 2020-09-20 DIAGNOSIS — M5481 Occipital neuralgia: Secondary | ICD-10-CM | POA: Diagnosis not present

## 2020-09-20 DIAGNOSIS — E119 Type 2 diabetes mellitus without complications: Secondary | ICD-10-CM | POA: Diagnosis not present

## 2020-09-20 DIAGNOSIS — I10 Essential (primary) hypertension: Secondary | ICD-10-CM | POA: Diagnosis not present

## 2020-09-20 DIAGNOSIS — E7800 Pure hypercholesterolemia, unspecified: Secondary | ICD-10-CM | POA: Diagnosis not present

## 2020-10-04 ENCOUNTER — Telehealth: Payer: Self-pay

## 2020-10-04 ENCOUNTER — Other Ambulatory Visit: Payer: Self-pay

## 2020-10-04 NOTE — Telephone Encounter (Signed)
Left vm to call back

## 2020-10-04 NOTE — Telephone Encounter (Signed)
Copied from [place] [id number]. Topic: General - Other >> Oct 03, 2020  5:27 PM Yvette Rack wrote: Reason for CRM: Pt called with questions regarding his last pneumonia shot. Pt requests call back.

## 2020-10-22 ENCOUNTER — Telehealth: Payer: Self-pay

## 2020-11-04 NOTE — Progress Notes (Signed)
Name: Austin Hunt   MRN: [id number]    DOB: 1940/12/28   Date:11/05/2020       Progress Note  Subjective  Chief Complaint  Follow up   HPI   DMII: glucose at homehas been well controlled in the low 100's .Last A1C was done 12/2019 and it was 7.3 %  He denies polyphagia, polydipsia or polyuria. Vision is stable he has glaucoma ( uses eye drops) and cataract surgery.He also has ED and sees Urologist , he is currently on Cialis advised to discuss other options with Urologist - pump or injections.He is only on Metformin , he follows a diabetic diet. He is on Zetia since intolerant to statin therapy. He is due for repeat labs.   Leukopenia: chronic , last two times it was 2.9 back in Feb 2021  , we had discussed referral to hematologist but his weight is stable but he is now willing to see hematologist , he states at home his weight is down to around 125 lbs and he is concerned about it now   BPH and prostate cancer: he is seeing Urologist and denies any obstructive symptoms, except for nocturiaabout 1-  2 times per night. Incidental finding of prostate cancer during TURP back in 2015 , last PSA was done June 2021 and it was 2.12 , he is now on yearly follow up with Dr. Caryl Pina   History of HTN : patient is off medication, no chest pain or palpitation, he states bp at home has been around120's/80's. BP resolved with weight loss   Dyslipidemia: he is not on statin therapy, he refuses medication - myalgia in the past, hewastaking Zetia now and last LDL was slightly better but still not at goal. He does not want injectable medication. We will recheck labs   Chronic constipation: taking Amitizaprnbecause it causes diarrhea He has increased water intake , he drinks prune juice prn. He states he has been having bowel movements about 4 times a week  , no abdominal pain or blood in stools. He is currently doing a fast for church.   SOB with activity:He had a stress test done by Dr.  Clayborn Bigness for evaluation of chest pain back in 11/2018 and results were. He goes back yearly to see Dr. Clayborn Bigness, he states SOB has resolved, no longer a problem  Normal myocardial perfusion scan no evidence of stress-induced  myocardial ischemia ejection fraction of 64% conclusion negative scan.  Low risk scan  Recurrent rash on groin: seen by Dr. Phillip Heal and has Ketoconazole, no longer using hydrocortisone   Eczema he states since shingles vaccine has itching on left deltoid area, not sure if he has a rash.   Laceration arm: he states he noticed a cut on his left arm last week, he is due for tetanus booster and we will give it to him today  Patient Active Problem List   Diagnosis Date Noted  . Statin intolerance 06/21/2020  . 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 ([place]) 06/21/2020  . Prostate cancer ([place]) 02/07/2018  . Cervical pain (neck) 10/13/2016  . 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 Long term current use of systemic steroids 01/07/2016  . Neutropenia ([place]) 01/07/2016  . Anxiety 04/08/2015  . 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 Chronic constipation 04/08/2015  . 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 Failure of erection 04/08/2015  . H/O inguinal hernia repair 04/08/2015  .  HLD (hyperlipidemia) 04/08/2015  . 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 LBP (low back pain) 04/08/2015  . 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 Drug intolerance 04/08/2015  . Kidney lump 04/08/2015  . TI (tricuspid incompetence) 04/08/2015  . Unilateral recurrent femoral hernia without obstruction or gangrene 05/23/2014  . Femoral hernia 05/23/2014  . Right inguinal hernia 02/06/2014  . Cataract 01/16/2014  . Glaucoma 01/16/2014  . Reflux esophagitis 06/21/2009  . Coronary atherosclerosis 04/03/2009  . Benign enlargement of prostate 02/11/2009  . Decreased libido  01/12/2008  . Benign essential HTN 09/12/2007    Past Surgical History:  Procedure Laterality Date  . APPENDECTOMY  1954  . BLADDER SURGERY  Oct 2015  . CARDIAC CATHETERIZATION  2002   1 stent  . COLONOSCOPY    . EYE SURGERY  2011, 2013   cataracts  . EYE SURGERY Right May 2016   glaucoma  . HERNIA REPAIR Left 1967  . HERNIA REPAIR Right 05-10-14   Right femoral hernia repair Dr Bary Castilla with PerFix plug, no onlay mesh..   . PHOTOCOAGULATION WITH LASER Right 12/30/2015   Procedure: PHOTOCOAGULATION WITH LASER;  Surgeon: Ronnell Freshwater, MD;  Location: [place];  Service: Ophthalmology;  Laterality: Right;  DIABETIC - oral meds IVA BLOCK  . PROSTATE SURGERY  Oct 2015    Family History  Problem Relation Age of Onset  . Diabetes Sister   . Diabetes Brother   . Diabetes Sister   . Prostate cancer Neg Hx   . Bladder Cancer Neg Hx     Social History   Tobacco Use  . Smoking status: Never Smoker  . Smokeless tobacco: Never Used  . Tobacco comment: smoking cessation materials not required  Substance Use Topics  . Alcohol use: No     Current Outpatient Medications:  .  aspirin 81 MG tablet, Take 81 mg by mouth daily. , Disp: , Rfl:  .  brimonidine-timolol (COMBIGAN) 0.2-0.5 % ophthalmic solution, Place 1 drop into both eyes every 12 (twelve) hours., Disp: , Rfl:  .  Cholecalciferol (VITAMIN D) 50 MCG (2000 UT) CAPS, Take 1 capsule (2,000 Units total) by mouth daily., Disp: 30 capsule, Rfl: 0 .  Cyanocobalamin (B-12) 1000 MCG SUBL, Place 1 tablet under the tongue daily., Disp: 30 tablet, Rfl: 0 .  dorzolamide (TRUSOPT) 2 % ophthalmic solution, 1 drop 2 (two) times daily. , Disp: , Rfl:  .  ezetimibe (ZETIA) 10 MG tablet, TAKE 1 TABLET BY MOUTH ONCE DAILY, Disp: 90 tablet, Rfl: 3 .  gabapentin (NEURONTIN) 100 MG capsule, Take 1-3 capsules (100-300 mg total) by mouth at bedtime. For itching on foot (Patient taking differently: Take 100-300 mg by mouth at  bedtime. For itching on foot Open capsules and empty into applesauce to avoid gelatin), Disp: 90 capsule, Rfl: 0 .  Garlic 10 MG CAPS, Take 15 mg by mouth daily. , Disp: , Rfl:  .  gentamicin ointment (GARAMYCIN) 0.1 %, Apply topically., Disp: , Rfl:  .  ketoconazole (NIZORAL) 2 % cream, SMARTSIG:1 Application Topical 1 to 2 Times Daily, Disp: , Rfl:  .  latanoprost (XALATAN) 0.005 % ophthalmic solution, Place 1 drop into both eyes at bedtime. , Disp: , Rfl:  .  Magnesium 100 MG TABS, Take by mouth. , Disp: , Rfl:  .  Multiple Vitamin (MULTIVITAMIN) tablet, Take 1 tablet by mouth daily., Disp: , Rfl:  .  OMEGA-3 FATTY ACIDS PO, Take 250 mg by mouth daily. , Disp: , Rfl:  .  triamcinolone (KENALOG) 0.025 % cream, Apply topically., Disp: , Rfl:  .  vitamin C (ASCORBIC ACID) 500 MG tablet, Take 500 mg by mouth 2 (two) times daily. Chewable, Disp: , Rfl:  .  Zinc Acetate 50 MG CAPS, Take 1 capsule by mouth daily. , Disp: , Rfl:  .  Accu-Chek Softclix Lancets lancets, TEST 4 TIMES DAILY (Patient not taking: Reported on 11/05/2020), Disp: 100 each, Rfl: 5 .  Blood Glucose Monitoring Suppl (ACCU-CHEK GUIDE ME) w/Device KIT, , Disp: , Rfl:  .  glucose blood (ACCU-CHEK ACTIVE STRIPS) test strip, Use as instructed (Patient not taking: Reported on 11/05/2020), Disp: 100 each, Rfl: 12 .  metFORMIN (GLUCOPHAGE-XR) 500 MG 24 hr tablet, TAKE 2 TABLETS BY MOUTH ONCE EVERY MORNING AND 1 TABLET AT BEDTIME, Disp: 270 tablet, Rfl: 1 .  tadalafil (CIALIS) 20 MG tablet, TAKE 0.5-1 TABLET BY MOUTH EVERY OTHER DAY AS NEEDED FOR ERECTILE DYSFUNCTION, Disp: 30 tablet, Rfl: 0  Allergies  Allergen Reactions  . Colesevelam Other (See Comments)  . Penicillins Itching and Other (See Comments)  . Statins Other (See Comments)    myalgia  . Welchol [Colesevelam Hcl]     I personally reviewed active problem list, medication list, allergies, family history, social history, health maintenance with the patient/caregiver  today.   ROS  Constitutional: Negative for fever or weight change.  Respiratory: Negative for cough and shortness of breath.   Cardiovascular: Negative for chest pain or palpitations.  Gastrointestinal: Negative for abdominal pain, no bowel changes.  Musculoskeletal: Negative for gait problem or joint swelling.  Skin: positive for rash.  Neurological: Negative for dizziness or headache.  No other specific complaints in a complete review of systems (except as listed in HPI above).  Objective  Vitals:   11/05/20 0907  BP: 132/70  Pulse: 62  Resp: 16  Temp: 97.6 F (36.4 C)  TempSrc: Oral  SpO2: 98%  Weight: 132 lb 1.6 oz (59.9 kg)  Height: 5' 9"  (1.753 m)    Body mass index is 19.51 kg/m.  Physical Exam  Constitutional: Patient appears well-developed and well-nourished. No distress.  HEENT: head atraumatic, normocephalic, pupils equal and reactive to light,  neck supple Cardiovascular: Normal rate, regular rhythm and normal heart sounds.  No murmur heard. No BLE edema. Pulmonary/Chest: Effort normal and breath sounds normal. No respiratory distress. Abdominal: Soft.  There is no tenderness. Eczema: eczematous patch on left deltoid area  Psychiatric: Patient has a normal mood and affect. behavior is normal. Judgment and thought content normal.  Recent Results (from the past 2160 hour(s))  HM DIABETES EYE EXAM     Status: None   Collection Time: 08/09/20 12:00 AM  Result Value Ref Range   HM Diabetic Eye Exam No Retinopathy No Retinopathy    Comment: [place] [facility]     PHQ2/9: Depression screen [facility] 2/9 11/05/2020 09/18/2020 06/21/2020 05/30/2020 02/20/2020  Decreased Interest 0 0 0 0 0  Down, Depressed, Hopeless 0 0 0 0 0  PHQ - 2 Score 0 0 0 0 0  Altered sleeping - - 0 - 0  Tired, decreased energy - - 0 - 0  Change in appetite - - 0 - 0  Feeling bad or failure about yourself  - - 0 - 0  Trouble concentrating - - 0 - 0  Moving slowly or fidgety/restless - - 0 - 0   Suicidal thoughts - - 0 - 0  PHQ-9 Score - - 0 - 0  Difficult doing work/chores - - - -  Not difficult at all  Some recent data might be hidden    phq 9 is negative   Fall Risk: Fall Risk  11/05/2020 09/18/2020 06/21/2020 05/30/2020 02/20/2020  Falls in the past year? 0 0 0 0 0  Number falls in past yr: 0 0 - 0 0  Injury with Fall? 0 0 - 0 0  Risk for fall due to : - - - No Fall Risks -  Risk for fall due to: Comment - - - - -  Follow up Falls evaluation completed - - Falls prevention discussed Falls evaluation completed    Functional Status Survey: Is the patient deaf or have difficulty hearing?: No Does the patient have difficulty seeing, even when wearing glasses/contacts?: No Does the patient have difficulty concentrating, remembering, or making decisions?: No Does the patient have difficulty walking or climbing stairs?: No Does the patient have difficulty dressing or bathing?: No Does the patient have difficulty doing errands alone such as visiting a doctor's office or shopping?: No    Assessment & Plan  1. Controlled type 2 diabetes with neuropathy ([place])   2. B12 deficiency  - Vitamin B12  3. Dyslipidemia  - Lipid panel  4. 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 Neutropenia, unspecified type ([place])  - CBC with Differential/Platelet - Ambulatory referral to Hematology  7. Vitamin D deficiency  Continue supplementation   8. 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 Myalgia due to statin   10. Chronic eczema  Resume topical medication on right deltoid area  11. Erectile dysfunction, unspecified erectile dysfunction type  - tadalafil (CIALIS) 20 MG tablet; TAKE 0.5-1 TABLET BY MOUTH EVERY OTHER DAY AS NEEDED FOR ERECTILE DYSFUNCTION  Dispense: 30 tablet; Refill: 0  12. Need for Tdap vaccination  - Tdap vaccine greater than or equal to 7yo IM  13. Laceration of left upper arm, initial encounter  - Tdap vaccine greater than or equal to 7yo IM  14. Weight loss  - Ambulatory referral to Hematology

## 2020-11-05 ENCOUNTER — Other Ambulatory Visit: Payer: Self-pay

## 2020-11-05 ENCOUNTER — Encounter: Payer: Self-pay | Admitting: Family Medicine

## 2020-11-05 ENCOUNTER — Ambulatory Visit (INDEPENDENT_AMBULATORY_CARE_PROVIDER_SITE_OTHER): Payer: Medicare Other | Admitting: Family Medicine

## 2020-11-05 VITALS — BP 132/70 | HR 62 | Temp 97.6°F | Resp 16 | Ht 69.0 in | Wt 132.1 lb

## 2020-11-05 DIAGNOSIS — N521 Erectile dysfunction due to diseases classified elsewhere: Secondary | ICD-10-CM | POA: Diagnosis not present

## 2020-11-05 DIAGNOSIS — E1136 Type 2 diabetes mellitus with diabetic cataract: Secondary | ICD-10-CM | POA: Diagnosis not present

## 2020-11-05 DIAGNOSIS — E1140 Type 2 diabetes mellitus with diabetic neuropathy, unspecified: Secondary | ICD-10-CM | POA: Diagnosis not present

## 2020-11-05 DIAGNOSIS — M791 Myalgia, unspecified site: Secondary | ICD-10-CM | POA: Diagnosis not present

## 2020-11-05 DIAGNOSIS — T466X5A Adverse effect of antihyperlipidemic and antiarteriosclerotic drugs, initial encounter: Secondary | ICD-10-CM

## 2020-11-05 DIAGNOSIS — E1139 Type 2 diabetes mellitus with other diabetic ophthalmic complication: Secondary | ICD-10-CM

## 2020-11-05 DIAGNOSIS — R634 Abnormal weight loss: Secondary | ICD-10-CM

## 2020-11-05 DIAGNOSIS — Z23 Encounter for immunization: Secondary | ICD-10-CM | POA: Diagnosis not present

## 2020-11-05 DIAGNOSIS — E785 Hyperlipidemia, unspecified: Secondary | ICD-10-CM | POA: Diagnosis not present

## 2020-11-05 DIAGNOSIS — D709 Neutropenia, unspecified: Secondary | ICD-10-CM

## 2020-11-05 DIAGNOSIS — H42 Glaucoma in diseases classified elsewhere: Secondary | ICD-10-CM

## 2020-11-05 DIAGNOSIS — E538 Deficiency of other specified B group vitamins: Secondary | ICD-10-CM

## 2020-11-05 DIAGNOSIS — L309 Dermatitis, unspecified: Secondary | ICD-10-CM | POA: Diagnosis not present

## 2020-11-05 DIAGNOSIS — S41112A Laceration without foreign body of left upper arm, initial encounter: Secondary | ICD-10-CM | POA: Diagnosis not present

## 2020-11-05 DIAGNOSIS — N529 Male erectile dysfunction, unspecified: Secondary | ICD-10-CM | POA: Diagnosis not present

## 2020-11-05 DIAGNOSIS — E559 Vitamin D deficiency, unspecified: Secondary | ICD-10-CM

## 2020-11-05 MED ORDER — METFORMIN HCL ER 500 MG PO TB24: ORAL_TABLET | ORAL | 1 refills | Status: DC

## 2020-11-05 MED ORDER — TADALAFIL 20 MG PO TABS: ORAL_TABLET | ORAL | 0 refills | Status: DC

## 2020-11-06 LAB — COMPLETE METABOLIC PANEL WITH GFR
AG Ratio: 1.6 (calc) (ref 1.0–2.5)
ALT: 11 U/L (ref 9–46)
AST: 20 U/L (ref 10–35)
Albumin: 4.5 g/dL (ref 3.6–5.1)
Alkaline phosphatase (APISO): 65 U/L (ref 35–144)
BUN: 16 mg/dL (ref 7–25)
CO2: 32 mmol/L (ref 20–32)
Calcium: 9.7 mg/dL (ref 8.6–10.3)
Chloride: 98 mmol/L (ref 98–110)
Creat: 0.83 mg/dL (ref 0.70–1.11)
GFR, Est African American: 96 mL/min/{1.73_m2} (ref >=60)
GFR, Est Non African American: 83 mL/min/{1.73_m2} (ref >=60)
Globulin: 2.8 g/dL (calc) (ref 1.9–3.7)
Glucose, Bld: 130 mg/dL — ABNORMAL HIGH (ref 65–99)
Potassium: 4.3 mmol/L (ref 3.5–5.3)
Sodium: 137 mmol/L (ref 135–146)
Total Bilirubin: 0.6 mg/dL (ref 0.2–1.2)
Total Protein: 7.3 g/dL (ref 6.1–8.1)

## 2020-11-06 LAB — CBC WITH DIFFERENTIAL/PLATELET
Absolute Monocytes: 267 cells/uL (ref 200–950)
Basophils Absolute: 9 cells/uL (ref 0–200)
Basophils Relative: 0.3 %
Eosinophils Absolute: 90 cells/uL (ref 15–500)
Eosinophils Relative: 3 %
HCT: 43.6 % (ref 38.5–50.0)
Hemoglobin: 14.8 g/dL (ref 13.2–17.1)
Lymphs Abs: 1125 cells/uL (ref 850–3900)
MCH: 30.3 pg (ref 27.0–33.0)
MCHC: 33.9 g/dL (ref 32.0–36.0)
MCV: 89.2 fL (ref 80.0–100.0)
MPV: 9.3 fL (ref 7.5–12.5)
Monocytes Relative: 8.9 %
Neutro Abs: 1509 cells/uL (ref 1500–7800)
Neutrophils Relative %: 50.3 %
Platelets: 231 10*3/uL (ref 140–400)
RBC: 4.89 10*6/uL (ref 4.20–5.80)
RDW: 11.8 % (ref 11.0–15.0)
Total Lymphocyte: 37.5 %
WBC: 3 10*3/uL — ABNORMAL LOW (ref 3.8–10.8)

## 2020-11-06 LAB — LIPID PANEL
Cholesterol: 207 mg/dL — ABNORMAL HIGH (ref <200)
HDL: 60 mg/dL (ref >=40)
LDL Cholesterol (Calc): 128 mg/dL (calc) — ABNORMAL HIGH
Non-HDL Cholesterol (Calc): 147 mg/dL (calc) — ABNORMAL HIGH (ref <130)
Total CHOL/HDL Ratio: 3.5 (calc) (ref <5.0)
Triglycerides: 88 mg/dL (ref <150)

## 2020-11-06 LAB — VITAMIN B12: Vitamin B-12: 678 pg/mL (ref 200–1100)

## 2020-11-06 LAB — HEMOGLOBIN A1C
Hgb A1c MFr Bld: 7.1 % of total Hgb — ABNORMAL HIGH (ref <5.7)
Mean Plasma Glucose: 157 mg/dL
eAG (mmol/L): 8.7 mmol/L

## 2020-11-11 ENCOUNTER — Ambulatory Visit (INDEPENDENT_AMBULATORY_CARE_PROVIDER_SITE_OTHER): Payer: Medicare Other | Admitting: Podiatry

## 2020-11-11 ENCOUNTER — Other Ambulatory Visit: Payer: Self-pay

## 2020-11-11 ENCOUNTER — Encounter: Payer: Self-pay | Admitting: Podiatry

## 2020-11-11 DIAGNOSIS — B351 Tinea unguium: Secondary | ICD-10-CM | POA: Diagnosis not present

## 2020-11-11 DIAGNOSIS — E119 Type 2 diabetes mellitus without complications: Secondary | ICD-10-CM | POA: Diagnosis not present

## 2020-11-11 DIAGNOSIS — M79676 Pain in unspecified toe(s): Secondary | ICD-10-CM

## 2020-11-11 DIAGNOSIS — M2010 Hallux valgus (acquired), unspecified foot: Secondary | ICD-10-CM

## 2020-11-11 NOTE — Progress Notes (Signed)
This patient returns to my office for at risk foot care.  This patient requires this care by a professional since this patient will be at risk due to having type 2 diabetes.   This patient is unable to cut nails himself since the patient cannot reach his nails.These nails are painful walking and wearing shoes.  This patient presents for at risk foot care today.  General Appearance  Alert, conversant and in no acute stress.  Vascular  Dorsalis pedis and posterior tibial  pulses are weakly  palpable  bilaterally.  Capillary return is within normal limits  bilaterally. Cold feet.  Bilaterally. Absent digital hair.  Neurologic  Senn-Weinstein monofilament wire test within normal limits  bilaterally. Muscle power within normal limits bilaterally.  Nails Thick disfigured discolored nails with subungual debris  from hallux to fifth toes bilaterally. No evidence of bacterial infection or drainage bilaterally.  Orthopedic  No limitations of motion  feet .  No crepitus or effusions noted.  No bony pathology or digital deformities noted.  HAV  B/L.  Bony prominence fifth metabase.    Skin  normotropic skin with no porokeratosis noted bilaterally.  No signs of infections or ulcers noted.     Onychomycosis  Pain in right toes  Pain in left toes  Consent was obtained for treatment procedures.   Mechanical debridement of nails 1-5  bilaterally performed with a nail nipper.  Filed with dremel without incident.    Return office visit   4 months                   Told patient to return for periodic foot care and evaluation due to potential at risk complications.   Gardiner Barefoot DPM

## 2020-11-12 ENCOUNTER — Inpatient Hospital Stay: Payer: Medicare Other

## 2020-11-12 ENCOUNTER — Encounter: Payer: Self-pay | Admitting: Oncology

## 2020-11-12 ENCOUNTER — Inpatient Hospital Stay: Payer: Medicare Other | Attending: Oncology | Admitting: Oncology

## 2020-11-12 VITALS — BP 153/80 | HR 60 | Temp 96.6°F | Resp 18 | Ht 69.0 in | Wt 135.2 lb

## 2020-11-12 DIAGNOSIS — D72819 Decreased white blood cell count, unspecified: Secondary | ICD-10-CM

## 2020-11-12 DIAGNOSIS — R634 Abnormal weight loss: Secondary | ICD-10-CM

## 2020-11-12 DIAGNOSIS — Z8546 Personal history of malignant neoplasm of prostate: Secondary | ICD-10-CM

## 2020-11-12 DIAGNOSIS — C61 Malignant neoplasm of prostate: Secondary | ICD-10-CM

## 2020-11-12 LAB — CBC WITH DIFFERENTIAL/PLATELET
Abs Immature Granulocytes: 0 10*3/uL (ref 0.00–0.07)
Basophils Absolute: 0 10*3/uL (ref 0.0–0.1)
Basophils Relative: 1 %
Eosinophils Absolute: 0.1 10*3/uL (ref 0.0–0.5)
Eosinophils Relative: 2 %
HCT: 47.3 % (ref 39.0–52.0)
Hemoglobin: 15.3 g/dL (ref 13.0–17.0)
Immature Granulocytes: 0 %
Lymphocytes Relative: 36 %
Lymphs Abs: 1.2 10*3/uL (ref 0.7–4.0)
MCH: 29.4 pg (ref 26.0–34.0)
MCHC: 32.3 g/dL (ref 30.0–36.0)
MCV: 90.8 fL (ref 80.0–100.0)
Monocytes Absolute: 0.3 10*3/uL (ref 0.1–1.0)
Monocytes Relative: 8 %
Neutro Abs: 1.7 10*3/uL (ref 1.7–7.7)
Neutrophils Relative %: 53 %
Platelets: 227 10*3/uL (ref 150–400)
RBC: 5.21 MIL/uL (ref 4.22–5.81)
RDW: 12.5 % (ref 11.5–15.5)
WBC: 3.3 10*3/uL — ABNORMAL LOW (ref 4.0–10.5)
nRBC: 0 % (ref 0.0–0.2)

## 2020-11-12 LAB — HEPATITIS PANEL, ACUTE
HCV Ab: NONREACTIVE
Hep A IgM: NONREACTIVE
Hep B C IgM: NONREACTIVE
Hepatitis B Surface Ag: NONREACTIVE

## 2020-11-12 LAB — RETIC PANEL
Immature Retic Fract: 18.5 % — ABNORMAL HIGH (ref 2.3–15.9)
RBC.: 5.13 MIL/uL (ref 4.22–5.81)
Retic Count, Absolute: 94.4 10*3/uL (ref 19.0–186.0)
Retic Ct Pct: 1.8 % (ref 0.4–3.1)
Reticulocyte Hemoglobin: 32.7 pg (ref >27.9)

## 2020-11-12 LAB — TSH: TSH: 2.856 u[IU]/mL (ref 0.350–4.500)

## 2020-11-12 LAB — TECHNOLOGIST SMEAR REVIEW
Plt Morphology: NORMAL
RBC Morphology: NORMAL
WBC Morphology: NORMAL

## 2020-11-12 LAB — LACTATE DEHYDROGENASE: LDH: 154 U/L (ref 98–192)

## 2020-11-12 LAB — FOLATE: Folate: 36 ng/mL (ref >5.9)

## 2020-11-12 LAB — HIV ANTIBODY (ROUTINE TESTING W REFLEX): HIV Screen 4th Generation wRfx: NONREACTIVE

## 2020-11-12 NOTE — Progress Notes (Signed)
Hematology/Oncology Consult note [facility] Telephone:([phone] [phone]   Patient Care Team: [name], MD as PCP - General (Family Medicine) Bary Castilla, Forest Gleason, MD (General Surgery) Yolonda Kida, MD as Consulting Physician (Cardiology) Anabel Bene, MD as Referring Physician (Neurology) Hollice Espy, MD as Consulting Physician (Urology) Leandrew Koyanagi, MD as Consulting Physician (Ophthalmology) Gardiner Barefoot, DPM as Consulting Physician (Podiatry) Beverly Gust, MD (Otolaryngology) Verdia Kuba, [facility] (Inactive) (Pharmacist)  REFERRING PROVIDER: [name], MD  CHIEF COMPLAINTS/REASON FOR VISIT:  Evaluation of leukopenia  HISTORY OF PRESENTING ILLNESS:  Austin Hunt is a 80 y.o. male who was seen in consultation at the request of [name], MD for evaluation of leukopenia. Reviewed patient's recent labs. Patient has low total WBC count was 3.0, no differential abnormalities.   Previous lab records reviewed. Leukopenia duration is chronic onset, duration is since 2017.  Patient has had neutropenia previously, not neutropenic on most recent 2 blood work done. No aggravating or improving factors.  Associated symptoms:  denies fatigue,fever, chills, frequent infection.  History hepatitis or HIV infection: Denies History of chronic liver disease: Denies History of blood transfusion: Denies Alcohol consumption: Denies Diet Vegetarian or Vegan: Denies Herbal medication: Patient takes red clover tea, peppermint tea.   Denies any frequent infections. He reports having significant weight loss in the last year.  Patient reports that he used to weigh 150 -175 pounds and now only in the 120s.  Reviewed his weight was recorded during his clinical visit with other providers. June 2019 142 pounds June 2020 135 pounds January 2021, 133 pounds April 2021, 133 pounds August 2021, 134 pounds October 2021, 133  pounds   Patient has a history of prostate cancer, Gleason 3+3 identified on TURP specimen in 2015.  Patient has been followed up by urology Dr. Erlene Quan.  Most recent PSA was done on 04/26/2020 which was 2.12.  Review of Systems  Constitutional: Positive for unexpected weight change. Negative for appetite change, chills, fatigue and fever.  HENT:   Negative for hearing loss and voice change.   Eyes: Negative for eye problems and icterus.  Respiratory: Negative for chest tightness, cough and shortness of breath.   Cardiovascular: Negative for chest pain and leg swelling.  Gastrointestinal: Negative for abdominal distention and abdominal pain.  Endocrine: Negative for hot flashes.  Genitourinary: Negative for difficulty urinating, dysuria and frequency.   Musculoskeletal: Negative for arthralgias.  Skin: Negative for itching and rash.  Neurological: Negative for light-headedness and numbness.  Hematological: Negative for adenopathy. Does not bruise/bleed easily.  Psychiatric/Behavioral: Negative for confusion.    MEDICAL HISTORY:  Past Medical History:  Diagnosis Date  . 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 Balanitis   . BPH (benign prostatic hypertrophy) with urinary obstruction   . 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 Diabetes mellitus without complication ([place])   . Glaucoma   . Headache    rare - 1x/mo  . Nocturia   . Prostate cancer [facility])     SURGICAL HISTORY: Past Surgical History:  Procedure Laterality Date  . APPENDECTOMY  1954  . BLADDER SURGERY  Oct 2015  . CARDIAC CATHETERIZATION  2002   1 stent  . COLONOSCOPY    . EYE SURGERY  2011, 2013   cataracts  . EYE SURGERY Right May 2016   glaucoma  . HERNIA REPAIR Left 1967  . HERNIA REPAIR Right 05-10-14   Right femoral hernia repair Dr Bary Castilla with PerFix plug, no  onlay mesh..   . PHOTOCOAGULATION WITH LASER Right 12/30/2015   Procedure: PHOTOCOAGULATION WITH LASER;  Surgeon: Ronnell Freshwater, MD;  Location: [facility];  Service: Ophthalmology;  Laterality: Right;  DIABETIC - oral meds IVA BLOCK  . PROSTATE SURGERY  Oct 2015    SOCIAL HISTORY: Social History   Socioeconomic History  . Marital status: Married    Spouse name: Onalee Hua  . Number of children: 4  . Years of education: some college  . Highest education level: 12th grade  Occupational History    Employer: RETIRED  Tobacco Use  . Smoking status: Never Smoker  . Smokeless tobacco: Never Used  . Tobacco comment: smoking cessation materials not required  Vaping Use  . Vaping Use: Never used  Substance and Sexual Activity  . Alcohol use: No  . Drug use: No  . Sexual activity: Yes  Other Topics Concern  . Not on file  Social History Narrative  . Not on file   Social Determinants of Health   Financial Resource Strain: Low Risk   . Difficulty of Paying Living Expenses: Not very hard  Food Insecurity: No Food Insecurity  . Worried About Charity fundraiser in the Last Year: Never true  . Ran Out of Food in the Last Year: Never true  Transportation Needs: No Transportation Needs  . Lack of Transportation (Medical): No  . Lack of Transportation (Non-Medical): No  Physical Activity: Sufficiently Active  . Days of Exercise per Week: 4 days  . Minutes of Exercise per Session: 40 min  Stress: No Stress Concern Present  . Feeling of Stress : Not at all  Social Connections: Socially Integrated  . Frequency of Communication with Friends and Family: More than three times a week  . Frequency of Social Gatherings with Friends and Family: More than three times a week  . Attends Religious Services: More than 4 times per year  . Active Member of Clubs or Organizations: Yes  . Attends Archivist Meetings: More than 4 times per year  . Marital Status: Married  Human resources officer Violence: Not At Risk  . Fear of Current or Ex-Partner: No  . Emotionally Abused: No  . Physically Abused: No  . Sexually Abused: No    FAMILY  HISTORY: Family History  Problem Relation Age of Onset  . Diabetes Sister   . Diabetes Brother   . Diabetes Sister   . Prostate cancer Neg Hx   . Bladder Cancer Neg Hx     ALLERGIES:  is allergic to colesevelam, penicillins, statins, and welchol [colesevelam hcl].  MEDICATIONS:  Current Outpatient Medications  Medication Sig Dispense Refill  . Accu-Chek Softclix Lancets lancets TEST 4 TIMES DAILY 100 each 5  . aspirin 81 MG tablet Take 81 mg by mouth daily.     . Blood Glucose Monitoring Suppl (ACCU-CHEK GUIDE ME) w/Device KIT     . brimonidine-timolol (COMBIGAN) 0.2-0.5 % ophthalmic solution Place 1 drop into both eyes every 12 (twelve) hours.    . Cholecalciferol (VITAMIN D) 50 MCG (2000 UT) CAPS Take 1 capsule (2,000 Units total) by mouth daily. 30 capsule 0  . Cyanocobalamin (B-12) 1000 MCG SUBL Place 1 tablet under the tongue daily. 30 tablet 0  . dorzolamide (TRUSOPT) 2 % ophthalmic solution 1 drop 2 (two) times daily.     Marland Kitchen ezetimibe (ZETIA) 10 MG tablet TAKE 1 TABLET BY MOUTH ONCE DAILY 90 tablet 3  . gabapentin (NEURONTIN) 100 MG capsule Take 1-3  capsules (100-300 mg total) by mouth at bedtime. For itching on foot (Patient taking differently: Take 100-300 mg by mouth at bedtime. For itching on foot Open capsules and empty into applesauce to avoid gelatin) 90 capsule 0  . Garlic 10 MG CAPS Take 15 mg by mouth daily.     Marland Kitchen gentamicin ointment (GARAMYCIN) 0.1 % Apply topically.    Marland Kitchen glucose blood (ACCU-CHEK ACTIVE STRIPS) test strip Use as instructed 100 each 12  . ketoconazole (NIZORAL) 2 % cream SMARTSIG:1 Application Topical 1 to 2 Times Daily    . latanoprost (XALATAN) 0.005 % ophthalmic solution Place 1 drop into both eyes at bedtime.     . Magnesium 100 MG TABS Take by mouth.     . metFORMIN (GLUCOPHAGE-XR) 500 MG 24 hr tablet TAKE 2 TABLETS BY MOUTH ONCE EVERY MORNING AND 1 TABLET AT BEDTIME 270 tablet 1  . Multiple Vitamin (MULTIVITAMIN) tablet Take 1 tablet by mouth  daily.    . OMEGA-3 FATTY ACIDS PO Take 250 mg by mouth daily.     . tadalafil (CIALIS) 20 MG tablet TAKE 0.5-1 TABLET BY MOUTH EVERY OTHER DAY AS NEEDED FOR ERECTILE DYSFUNCTION 30 tablet 0  . triamcinolone (KENALOG) 0.025 % cream Apply topically.    . vitamin C (ASCORBIC ACID) 500 MG tablet Take 500 mg by mouth 2 (two) times daily. Chewable    . Zinc Acetate 50 MG CAPS Take 1 capsule by mouth daily.      No current facility-administered medications for this visit.     PHYSICAL EXAMINATION: ECOG PERFORMANCE STATUS: 1 - Symptomatic but completely ambulatory Vitals:   11/12/20 0948  BP: (!) 153/80  Pulse: 60  Resp: 18  Temp: (!) 96.6 F (35.9 C)   Filed Weights   11/12/20 0948  Weight: 135 lb 3.2 oz (61.3 kg)    Physical Exam Constitutional:      General: He is not in acute distress. HENT:     Head: Normocephalic and atraumatic.  Eyes:     General: No scleral icterus. Cardiovascular:     Rate and Rhythm: Normal rate and regular rhythm.     Heart sounds: Normal heart sounds.  Pulmonary:     Effort: Pulmonary effort is normal. No respiratory distress.     Breath sounds: No wheezing.  Abdominal:     General: Bowel sounds are normal. There is no distension.     Palpations: Abdomen is soft.  Musculoskeletal:        General: No deformity. Normal range of motion.     Cervical back: Normal range of motion and neck supple.  Skin:    General: Skin is warm and dry.     Findings: No erythema or rash.  Neurological:     Mental Status: He is alert and oriented to person, place, and time. Mental status is at baseline.     Cranial Nerves: No cranial nerve deficit.     Coordination: Coordination normal.  Psychiatric:        Mood and Affect: Mood normal.     RADIOGRAPHIC STUDIES: I have personally reviewed the radiological images as listed and agreed with the findings in the report.  CMP Latest Ref Rng & Units 11/05/2020  Glucose 65 - 99 mg/dL 130(H)  BUN 7 - 25 mg/dL 16   Creatinine 0.70 - 1.11 mg/dL 0.83  Sodium 135 - 146 mmol/L 137  Potassium 3.5 - 5.3 mmol/L 4.3  Chloride 98 - 110 mmol/L 98  CO2 20 -  32 mmol/L 32  Calcium 8.6 - 10.3 mg/dL 9.7  Total Protein 6.1 - 8.1 g/dL 7.3  Total Bilirubin 0.2 - 1.2 mg/dL 0.6  Alkaline Phos 40 - 115 U/L -  AST 10 - 35 U/L 20  ALT 9 - 46 U/L 11   CBC Latest Ref Rng & Units 11/12/2020  WBC 4.0 - 10.5 K/uL 3.3(L)  Hemoglobin 13.0 - 17.0 g/dL 15.3  Hematocrit 39.0 - 52.0 % 47.3  Platelets 150 - 400 K/uL 227    LABORATORY DATA:  I have reviewed the data as listed Lab Results  Component Value Date   WBC 3.3 (L) 11/12/2020   HGB 15.3 11/12/2020   HCT 47.3 11/12/2020   MCV 90.8 11/12/2020   PLT 227 11/12/2020   Recent Labs    12/15/19 1035 06/21/20 0910 11/05/20 0957  NA 140 138 137  K 4.5 4.5 4.3  CL 103 100 98  CO2 31 28 32  GLUCOSE 139* 124* 130*  BUN 16 21 16   CREATININE 0.88 0.95 0.83  CALCIUM 9.7 9.5 9.7  GFRNONAA 81 75 83  GFRAA 94 87 96  PROT 6.8 6.8 7.3  AST 17 23 20   ALT 11 14 11   BILITOT 0.5 0.6 0.6   Iron/TIBC/Ferritin/ %Sat No results found for: IRON, TIBC, FERRITIN, IRONPCTSAT   RADIOGRAPHIC STUDIES: I have personally reviewed the radiological images as listed and agreed with the findings in the report. No results found.    ASSESSMENT & PLAN:  1. Leukopenia, unspecified type   2. Weight loss   3. Prostate cancer [facility])     I discussed with patient that the differential diagnosis of the thrombosis is broad, including infection, inflammation, nutrition deficiency, malignant etiology including underlying bone morrow disorders.  For the work up of patient's thrombocytopenia, I recommend checking CBC;CMP, LDH; smear review, folate, hepatitis, HIV,  flowcytometry and monoclonal gammopathy workup.   Weight loss, according to previous records, weight seems to be stable for the past 1.5 year. He has lost 5-7 pounds comparing to his weight in 2019 or earlier.   History of prostate  cancer, Gleason 3+3, followed by urologist. On observation.   # Patient follow-up with me in approximately 2 weeks to review the above results.   Orders Placed This Encounter  Procedures  . CBC with Differential/Platelet    Standing Status:   Future    Number of Occurrences:   1    Standing Expiration Date:   11/12/2021  . Retic Panel    Standing Status:   Future    Number of Occurrences:   1    Standing Expiration Date:   11/12/2021  . Flow cytometry panel-leukemia/lymphoma work-up    Standing Status:   Future    Number of Occurrences:   1    Standing Expiration Date:   11/12/2021  . Folate    Standing Status:   Future    Number of Occurrences:   1    Standing Expiration Date:   11/12/2021  . Lactate dehydrogenase    Standing Status:   Future    Number of Occurrences:   1    Standing Expiration Date:   11/12/2021  . Protein Electro, Random Urine    Standing Status:   Future    Number of Occurrences:   1    Standing Expiration Date:   11/12/2021  . Hepatitis panel, acute    Standing Status:   Future    Number of Occurrences:   1  Standing Expiration Date:   11/12/2021  . HIV Antibody (routine testing w rflx)    Standing Status:   Future    Number of Occurrences:   1    Standing Expiration Date:   11/12/2021  . Technologist smear review    Standing Status:   Future    Number of Occurrences:   1    Standing Expiration Date:   11/12/2021  . TSH    Standing Status:   Future    Number of Occurrences:   1    Standing Expiration Date:   11/12/2021    All questions were answered. The patient knows to call the clinic with any problems questions or concerns.  Cc [name], MD  Return of visit: 2 weeks Thank you for this kind referral and the opportunity to participate in the care of this patient. A copy of today's note is routed to referring provider      Earlie Server, MD, PhD Hematology Oncology [facility] at [facility] Pager- [phone] 11/12/2020

## 2020-11-12 NOTE — Progress Notes (Signed)
Pt here to establish care.

## 2020-11-13 LAB — PROTEIN ELECTRO, RANDOM URINE
Albumin ELP, Urine: 100 %
Alpha-1-Globulin, U: 0 %
Alpha-2-Globulin, U: 0 %
Beta Globulin, U: 0 %
Gamma Globulin, U: 0 %
Total Protein, Urine: 9.8 mg/dL

## 2020-11-14 LAB — COMP PANEL: LEUKEMIA/LYMPHOMA

## 2020-11-15 ENCOUNTER — Telehealth: Payer: Self-pay | Admitting: *Deleted

## 2020-11-15 NOTE — Chronic Care Management (AMB) (Signed)
  Care Management   Note  11/15/2020 Name: DARRYN KYDD MRN: [id number] DOB: 10-24-1941  Austin Hunt is a 80 y.o. year old male who is a primary care patient of [name], MD and is actively engaged with the care management team. I reached out to [name] by phone today to assist with re-scheduling a follow up visit with the Pharmacist.  Follow up plan: Unsuccessful telephone outreach attempt made. A HIPAA compliant phone message was left for the patient providing contact information and requesting a return call.  The care management team will reach out to the patient again over the next 7 days.  If patient returns call to provider office, please advise to call [place] at [place] Management

## 2020-11-18 ENCOUNTER — Telehealth: Payer: Self-pay

## 2020-11-18 DIAGNOSIS — D72819 Decreased white blood cell count, unspecified: Secondary | ICD-10-CM

## 2020-11-18 NOTE — Telephone Encounter (Signed)
Done.. Pt will RTC on 11/20/20 for additional lab testing. Date and time pre pt request

## 2020-11-18 NOTE — Chronic Care Management (AMB) (Signed)
  Care Management   Note  11/18/2020 Name: ERLE GUSTER MRN: [id number] DOB: 08-08-1941  Austin Hunt is a 80 y.o. year old male who is a primary care patient of [name], MD and is actively engaged with the care management team. I reached out to [name] by phone today to assist with re-scheduling a follow up visit with the Pharmacist  Follow up plan: Unsuccessful telephone outreach attempt made. A HIPAA compliant phone message was left for the patient providing contact information and requesting a return call. The care management team will reach out to the patient again over the next 7 days. If patient returns call to provider office, please advise to call [place] at [phone].  [place]

## 2020-11-18 NOTE — Telephone Encounter (Signed)
-----  Message from Earlie Server, MD sent at 11/16/2020  7:33 PM EST ----- Please arrange him to get multiple myeloma panel and light chain ratio. Thanks.

## 2020-11-18 NOTE — Telephone Encounter (Signed)
Please call pt to schedule lab for additional lab testing. Lab orders entered.

## 2020-11-19 NOTE — Chronic Care Management (AMB) (Signed)
  Care Management   Note  11/19/2020 Name: KHIEM GARGIS MRN: [id number] DOB: 1941-08-27  Austin Hunt is a 80 y.o. year old male who is a primary care patient of [name], MD and is actively engaged with the care management team. I reached out to [name] by phone today to assist with re-scheduling a follow up visit with the Pharmacist  Follow up plan: Telephone appointment with care management team member scheduled for: 12/25/2020  [place] Management  Direct Dial: [phone]

## 2020-11-20 ENCOUNTER — Other Ambulatory Visit: Payer: Self-pay

## 2020-11-20 ENCOUNTER — Inpatient Hospital Stay: Payer: Medicare Other

## 2020-11-20 DIAGNOSIS — Z8546 Personal history of malignant neoplasm of prostate: Secondary | ICD-10-CM | POA: Diagnosis not present

## 2020-11-20 DIAGNOSIS — D72819 Decreased white blood cell count, unspecified: Secondary | ICD-10-CM

## 2020-11-20 DIAGNOSIS — R634 Abnormal weight loss: Secondary | ICD-10-CM | POA: Diagnosis not present

## 2020-11-21 LAB — KAPPA/LAMBDA LIGHT CHAINS
Kappa free light chain: 16.9 mg/L (ref 3.3–19.4)
Kappa, lambda light chain ratio: 1.69 — ABNORMAL HIGH (ref 0.26–1.65)
Lambda free light chains: 10 mg/L (ref 5.7–26.3)

## 2020-11-22 LAB — MULTIPLE MYELOMA PANEL, SERUM
Albumin SerPl Elph-Mcnc: 4.2 g/dL (ref 2.9–4.4)
Albumin/Glob SerPl: 1.6 (ref 0.7–1.7)
Alpha 1: 0.2 g/dL (ref 0.0–0.4)
Alpha2 Glob SerPl Elph-Mcnc: 0.7 g/dL (ref 0.4–1.0)
B-Globulin SerPl Elph-Mcnc: 0.9 g/dL (ref 0.7–1.3)
Gamma Glob SerPl Elph-Mcnc: 1.1 g/dL (ref 0.4–1.8)
Globulin, Total: 2.8 g/dL (ref 2.2–3.9)
IgA: 137 mg/dL (ref 61–437)
IgG (Immunoglobin G), Serum: 1193 mg/dL (ref 603–1613)
IgM (Immunoglobulin M), Srm: 30 mg/dL (ref 15–143)
Total Protein ELP: 7 g/dL (ref 6.0–8.5)

## 2020-11-25 ENCOUNTER — Telehealth: Payer: Self-pay

## 2020-11-25 NOTE — Telephone Encounter (Signed)
Copied from [place] [phone]. Topic: General - Other >> Nov 25, 2020 11:53 AM Leward Quan A wrote: Reason for CRM: Patient called in asking for a copy of his most recent HbA1c please print he will pick up in the morning at the front desk.

## 2020-11-26 ENCOUNTER — Encounter: Payer: Self-pay | Admitting: Oncology

## 2020-11-26 ENCOUNTER — Inpatient Hospital Stay (HOSPITAL_BASED_OUTPATIENT_CLINIC_OR_DEPARTMENT_OTHER): Payer: Medicare Other | Admitting: Oncology

## 2020-11-26 VITALS — BP 139/55 | HR 63 | Temp 96.7°F | Resp 18 | Wt 134.1 lb

## 2020-11-26 DIAGNOSIS — R634 Abnormal weight loss: Secondary | ICD-10-CM

## 2020-11-26 DIAGNOSIS — C61 Malignant neoplasm of prostate: Secondary | ICD-10-CM | POA: Diagnosis not present

## 2020-11-26 DIAGNOSIS — Z8546 Personal history of malignant neoplasm of prostate: Secondary | ICD-10-CM | POA: Diagnosis not present

## 2020-11-26 DIAGNOSIS — D72819 Decreased white blood cell count, unspecified: Secondary | ICD-10-CM

## 2020-11-26 NOTE — Progress Notes (Signed)
Pt here for follow up. No new concerns voiced.   

## 2020-11-26 NOTE — Progress Notes (Signed)
Hematology/Oncology Consult note [facility] Telephone:([phone] [phone]   Patient Care Team: [name], MD as PCP - General (Family Medicine) Bary Castilla, Forest Gleason, MD (General Surgery) Yolonda Kida, MD as Consulting Physician (Cardiology) Anabel Bene, MD as Referring Physician (Neurology) Hollice Espy, MD as Consulting Physician (Urology) Leandrew Koyanagi, MD as Consulting Physician (Ophthalmology) Gardiner Barefoot, DPM as Consulting Physician (Podiatry) Beverly Gust, MD (Otolaryngology) Germaine Pomfret, [facility] (Pharmacist)  REFERRING PROVIDER: [name], MD  CHIEF COMPLAINTS/REASON FOR VISIT:  Evaluation of leukopenia  HISTORY OF PRESENTING ILLNESS:  Austin Hunt is a 80 y.o. male who was seen in consultation at the request of [name], MD for evaluation of leukopenia. Reviewed patient's recent labs. Patient has low total WBC count was 3.0, no differential abnormalities.   Previous lab records reviewed. Leukopenia duration is chronic onset, duration is since 2017.  Patient has had neutropenia previously, not neutropenic on most recent 2 blood work done. No aggravating or improving factors.  Associated symptoms:  denies fatigue,fever, chills, frequent infection.  History hepatitis or HIV infection: Denies History of chronic liver disease: Denies History of blood transfusion: Denies Alcohol consumption: Denies Diet Vegetarian or Vegan: Denies Herbal medication: Patient takes red clover tea, peppermint tea.   Denies any frequent infections. He reports having significant weight loss in the last year.  Patient reports that he used to weigh 150 -175 pounds and now only in the 120s.  Reviewed his weight was recorded during his clinical visit with other providers. June 2019 142 pounds June 2020 135 pounds January 2021, 133 pounds April 2021, 133 pounds August 2021, 134 pounds October 2021, 133  pounds   Patient has a history of prostate cancer, Gleason 3+3 identified on TURP specimen in 2015.  Patient has been followed up by urology Dr. Erlene Quan.  Most recent PSA was done on 04/26/2020 which was 2.12.   INTERVAL HISTORY Austin Hunt is a 80 y.o. male who has above history reviewed by me today presents for follow up visit for management of leukopenia Problems and complaints are listed below:  He has had blood work done and presents to discuss work up results and Mudlogger.   Review of Systems  Constitutional: Negative for appetite change, chills, fatigue, fever and unexpected weight change.  HENT:   Negative for hearing loss and voice change.   Eyes: Negative for eye problems and icterus.  Respiratory: Negative for chest tightness, cough and shortness of breath.   Cardiovascular: Negative for chest pain and leg swelling.  Gastrointestinal: Negative for abdominal distention and abdominal pain.  Endocrine: Negative for hot flashes.  Genitourinary: Negative for difficulty urinating, dysuria and frequency.   Musculoskeletal: Negative for arthralgias.  Skin: Negative for itching and rash.  Neurological: Negative for light-headedness and numbness.  Hematological: Negative for adenopathy. Does not bruise/bleed easily.  Psychiatric/Behavioral: Negative for confusion.    MEDICAL HISTORY:  Past Medical History:  Diagnosis Date  . 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 Balanitis   . BPH (benign prostatic hypertrophy) with urinary obstruction   . 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 Diabetes mellitus without complication ([place])   . Glaucoma   . Headache    rare - 1x/mo  . Nocturia   . Prostate cancer [facility])     SURGICAL HISTORY: Past Surgical History:  Procedure Laterality Date  . APPENDECTOMY  1954  . BLADDER SURGERY  Oct 2015  . CARDIAC CATHETERIZATION  2002   1 stent  .  COLONOSCOPY    . EYE SURGERY  2011, 2013   cataracts  . EYE SURGERY Right May 2016   glaucoma  . HERNIA  REPAIR Left 1967  . HERNIA REPAIR Right 05-10-14   Right femoral hernia repair Dr Bary Castilla with PerFix plug, no onlay mesh..   . PHOTOCOAGULATION WITH LASER Right 12/30/2015   Procedure: PHOTOCOAGULATION WITH LASER;  Surgeon: Ronnell Freshwater, MD;  Location: [facility];  Service: Ophthalmology;  Laterality: Right;  DIABETIC - oral meds IVA BLOCK  . PROSTATE SURGERY  Oct 2015    SOCIAL HISTORY: Social History   Socioeconomic History  . Marital status: Married    Spouse name: Onalee Hua  . Number of children: 4  . Years of education: some college  . Highest education level: 12th grade  Occupational History    Employer: RETIRED  Tobacco Use  . Smoking status: Never Smoker  . Smokeless tobacco: Never Used  . Tobacco comment: smoking cessation materials not required  Vaping Use  . Vaping Use: Never used  Substance and Sexual Activity  . Alcohol use: No  . Drug use: No  . Sexual activity: Yes  Other Topics Concern  . Not on file  Social History Narrative  . Not on file   Social Determinants of Health   Financial Resource Strain: Low Risk   . Difficulty of Paying Living Expenses: Not very hard  Food Insecurity: No Food Insecurity  . Worried About Charity fundraiser in the Last Year: Never true  . Ran Out of Food in the Last Year: Never true  Transportation Needs: No Transportation Needs  . Lack of Transportation (Medical): No  . Lack of Transportation (Non-Medical): No  Physical Activity: Sufficiently Active  . Days of Exercise per Week: 4 days  . Minutes of Exercise per Session: 40 min  Stress: No Stress Concern Present  . Feeling of Stress : Not at all  Social Connections: Socially Integrated  . Frequency of Communication with Friends and Family: More than three times a week  . Frequency of Social Gatherings with Friends and Family: More than three times a week  . Attends Religious Services: More than 4 times per year  . Active Member of Clubs or  Organizations: Yes  . Attends Archivist Meetings: More than 4 times per year  . Marital Status: Married  Human resources officer Violence: Not At Risk  . Fear of Current or Ex-Partner: No  . Emotionally Abused: No  . Physically Abused: No  . Sexually Abused: No    FAMILY HISTORY: Family History  Problem Relation Age of Onset  . Diabetes Sister   . Diabetes Brother   . Diabetes Sister   . Prostate cancer Neg Hx   . Bladder Cancer Neg Hx     ALLERGIES:  is allergic to colesevelam, penicillins, statins, and welchol [colesevelam hcl].  MEDICATIONS:  Current Outpatient Medications  Medication Sig Dispense Refill  . Accu-Chek Softclix Lancets lancets TEST 4 TIMES DAILY 100 each 5  . aspirin 81 MG tablet Take 81 mg by mouth daily.     . Blood Glucose Monitoring Suppl (ACCU-CHEK GUIDE ME) w/Device KIT     . brimonidine-timolol (COMBIGAN) 0.2-0.5 % ophthalmic solution Place 1 drop into both eyes every 12 (twelve) hours.    . Cholecalciferol (VITAMIN D) 50 MCG (2000 UT) CAPS Take 1 capsule (2,000 Units total) by mouth daily. 30 capsule 0  . Cyanocobalamin (B-12) 1000 MCG SUBL Place 1 tablet under the tongue  daily. 30 tablet 0  . dorzolamide (TRUSOPT) 2 % ophthalmic solution 1 drop 2 (two) times daily.     Marland Kitchen ezetimibe (ZETIA) 10 MG tablet TAKE 1 TABLET BY MOUTH ONCE DAILY 90 tablet 3  . gabapentin (NEURONTIN) 100 MG capsule Take 1-3 capsules (100-300 mg total) by mouth at bedtime. For itching on foot (Patient taking differently: Take 100-300 mg by mouth at bedtime. For itching on foot Open capsules and empty into applesauce to avoid gelatin) 90 capsule 0  . Garlic 10 MG CAPS Take 15 mg by mouth daily.     Marland Kitchen gentamicin ointment (GARAMYCIN) 0.1 % Apply topically.    Marland Kitchen glucose blood (ACCU-CHEK ACTIVE STRIPS) test strip Use as instructed 100 each 12  . ketoconazole (NIZORAL) 2 % cream SMARTSIG:1 Application Topical 1 to 2 Times Daily    . latanoprost (XALATAN) 0.005 % ophthalmic solution  Place 1 drop into both eyes at bedtime.     . Magnesium 100 MG TABS Take by mouth.     . metFORMIN (GLUCOPHAGE-XR) 500 MG 24 hr tablet TAKE 2 TABLETS BY MOUTH ONCE EVERY MORNING AND 1 TABLET AT BEDTIME 270 tablet 1  . Multiple Vitamin (MULTIVITAMIN) tablet Take 1 tablet by mouth daily.    . OMEGA-3 FATTY ACIDS PO Take 250 mg by mouth daily.     . tadalafil (CIALIS) 20 MG tablet TAKE 0.5-1 TABLET BY MOUTH EVERY OTHER DAY AS NEEDED FOR ERECTILE DYSFUNCTION 30 tablet 0  . triamcinolone (KENALOG) 0.025 % cream Apply topically.    . vitamin C (ASCORBIC ACID) 500 MG tablet Take 500 mg by mouth 2 (two) times daily. Chewable    . Zinc Acetate 50 MG CAPS Take 1 capsule by mouth daily.      No current facility-administered medications for this visit.     PHYSICAL EXAMINATION: ECOG PERFORMANCE STATUS: 1 - Symptomatic but completely ambulatory Vitals:   11/26/20 1107  BP: (!) 139/55  Pulse: 63  Resp: 18  Temp: (!) 96.7 F (35.9 C)   Filed Weights   11/26/20 1107  Weight: 134 lb 1.6 oz (60.8 kg)    Physical Exam Constitutional:      General: He is not in acute distress. HENT:     Head: Normocephalic and atraumatic.  Eyes:     General: No scleral icterus. Cardiovascular:     Rate and Rhythm: Normal rate and regular rhythm.     Heart sounds: Normal heart sounds.  Pulmonary:     Effort: Pulmonary effort is normal. No respiratory distress.     Breath sounds: No wheezing.  Abdominal:     General: Bowel sounds are normal. There is no distension.     Palpations: Abdomen is soft.  Musculoskeletal:        General: No deformity. Normal range of motion.     Cervical back: Normal range of motion and neck supple.  Skin:    General: Skin is warm and dry.     Findings: No erythema or rash.  Neurological:     Mental Status: He is alert and oriented to person, place, and time. Mental status is at baseline.     Cranial Nerves: No cranial nerve deficit.     Coordination: Coordination normal.   Psychiatric:        Mood and Affect: Mood normal.     RADIOGRAPHIC STUDIES: I have personally reviewed the radiological images as listed and agreed with the findings in the report.  CMP Latest Ref Rng & Units  11/05/2020  Glucose 65 - 99 mg/dL 130(H)  BUN 7 - 25 mg/dL 16  Creatinine 0.70 - 1.11 mg/dL 0.83  Sodium 135 - 146 mmol/L 137  Potassium 3.5 - 5.3 mmol/L 4.3  Chloride 98 - 110 mmol/L 98  CO2 20 - 32 mmol/L 32  Calcium 8.6 - 10.3 mg/dL 9.7  Total Protein 6.1 - 8.1 g/dL 7.3  Total Bilirubin 0.2 - 1.2 mg/dL 0.6  Alkaline Phos 40 - 115 U/L -  AST 10 - 35 U/L 20  ALT 9 - 46 U/L 11   CBC Latest Ref Rng & Units 11/12/2020  WBC 4.0 - 10.5 K/uL 3.3(L)  Hemoglobin 13.0 - 17.0 g/dL 15.3  Hematocrit 39.0 - 52.0 % 47.3  Platelets 150 - 400 K/uL 227    LABORATORY DATA:  I have reviewed the data as listed Lab Results  Component Value Date   WBC 3.3 (L) 11/12/2020   HGB 15.3 11/12/2020   HCT 47.3 11/12/2020   MCV 90.8 11/12/2020   PLT 227 11/12/2020   Recent Labs    12/15/19 1035 06/21/20 0910 11/05/20 0957  NA 140 138 137  K 4.5 4.5 4.3  CL 103 100 98  CO2 31 28 32  GLUCOSE 139* 124* 130*  BUN 16 21 16   CREATININE 0.88 0.95 0.83  CALCIUM 9.7 9.5 9.7  GFRNONAA 81 75 83  GFRAA 94 87 96  PROT 6.8 6.8 7.3  AST 17 23 20   ALT 11 14 11   BILITOT 0.5 0.6 0.6   Iron/TIBC/Ferritin/ %Sat No results found for: IRON, TIBC, FERRITIN, IRONPCTSAT   RADIOGRAPHIC STUDIES: I have personally reviewed the radiological images as listed and agreed with the findings in the report. No results found.    ASSESSMENT & PLAN:  1. Leukopenia, unspecified type   2. Weight loss   3. Prostate cancer ([place])    # Leukopenia Labs are reviewed and discussed with patient. Mild leukopenia, with normal differential.  Normal B12 and folate level, LDH.  Flowcytometry showed minute population of polyclonal plasmablasts, 1%. No other significant immunophenotypic abnormality detected. - discussed  with him , probably reactive changes due to recent vaccination. Monitor.  Negative hepatitis and HIV.  SPEP showed no M protein. Mildly elevated Light chain ration. UPEP showed no M protein.    Weight loss, according to previous records, weight seems to be stable for the past 1.5 year. He has lost 5-7 pounds comparing to his weight in 2019 or earlier. Monitor.   History of prostate cancer, Gleason 3+3, followed by urologist. On observation.   # Patient follow-up with me in approximately 3 months.   Orders Placed This Encounter  Procedures  . CBC with Differential/Platelet    Standing Status:   Future    Standing Expiration Date:   11/26/2021  . Comprehensive metabolic panel    Standing Status:   Future    Standing Expiration Date:   11/26/2021  . Lactate dehydrogenase    Standing Status:   Future    Standing Expiration Date:   11/26/2021  . Flow cytometry panel-leukemia/lymphoma work-up    Standing Status:   Future    Standing Expiration Date:   11/26/2021    All questions were answered. The patient knows to call the clinic with any problems questions or concerns.  Cc [name], MD      Earlie Server, MD, PhD Hematology Oncology [facility] Pager- [phone] 11/26/2020

## 2020-12-09 DIAGNOSIS — H401133 Primary open-angle glaucoma, bilateral, severe stage: Secondary | ICD-10-CM | POA: Diagnosis not present

## 2020-12-25 ENCOUNTER — Telehealth: Payer: Self-pay

## 2020-12-27 ENCOUNTER — Telehealth: Payer: Self-pay

## 2020-12-27 NOTE — Progress Notes (Signed)
Chronic Care Management Pharmacy Assistant   Name: Austin Hunt  MRN: [id number] DOB: 1941/09/25  Reason for Encounter: Medication Review/General Adherence Call.  PCP : [name], MD  Allergies:   Allergies  Allergen Reactions  . Colesevelam Other (See Comments)  . Penicillins Itching and Other (See Comments)  . Statins Other (See Comments)    myalgia  . Welchol [Colesevelam Hcl]     Medications: Outpatient Encounter Medications as of 12/27/2020  Medication Sig  . Accu-Chek Softclix Lancets lancets TEST 4 TIMES DAILY  . aspirin 81 MG tablet Take 81 mg by mouth daily.   . Blood Glucose Monitoring Suppl (ACCU-CHEK GUIDE ME) w/Device KIT   . brimonidine-timolol (COMBIGAN) 0.2-0.5 % ophthalmic solution Place 1 drop into both eyes every 12 (twelve) hours.  . Cholecalciferol (VITAMIN D) 50 MCG (2000 UT) CAPS Take 1 capsule (2,000 Units total) by mouth daily.  . Cyanocobalamin (B-12) 1000 MCG SUBL Place 1 tablet under the tongue daily.  . dorzolamide (TRUSOPT) 2 % ophthalmic solution 1 drop 2 (two) times daily.   Marland Kitchen ezetimibe (ZETIA) 10 MG tablet TAKE 1 TABLET BY MOUTH ONCE DAILY  . gabapentin (NEURONTIN) 100 MG capsule Take 1-3 capsules (100-300 mg total) by mouth at bedtime. For itching on foot (Patient taking differently: Take 100-300 mg by mouth at bedtime. For itching on foot Open capsules and empty into applesauce to avoid gelatin)  . Garlic 10 MG CAPS Take 15 mg by mouth daily.   Marland Kitchen gentamicin ointment (GARAMYCIN) 0.1 % Apply topically.  Marland Kitchen glucose blood (ACCU-CHEK ACTIVE STRIPS) test strip Use as instructed  . ketoconazole (NIZORAL) 2 % cream SMARTSIG:1 Application Topical 1 to 2 Times Daily  . latanoprost (XALATAN) 0.005 % ophthalmic solution Place 1 drop into both eyes at bedtime.   . Magnesium 100 MG TABS Take by mouth.   . metFORMIN (GLUCOPHAGE-XR) 500 MG 24 hr tablet TAKE 2 TABLETS BY MOUTH ONCE EVERY MORNING AND 1 TABLET AT BEDTIME  . Multiple Vitamin  (MULTIVITAMIN) tablet Take 1 tablet by mouth daily.  . OMEGA-3 FATTY ACIDS PO Take 250 mg by mouth daily.   . tadalafil (CIALIS) 20 MG tablet TAKE 0.5-1 TABLET BY MOUTH EVERY OTHER DAY AS NEEDED FOR ERECTILE DYSFUNCTION  . triamcinolone (KENALOG) 0.025 % cream Apply topically.  . vitamin C (ASCORBIC ACID) 500 MG tablet Take 500 mg by mouth 2 (two) times daily. Chewable  . Zinc Acetate 50 MG CAPS Take 1 capsule by mouth daily.    No facility-administered encounter medications on file as of 12/27/2020.    Current Diagnosis: Patient Active Problem List   Diagnosis Date Noted  . Statin intolerance 06/21/2020  . 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 ([place]) 06/21/2020  . Prostate cancer ([place]) 02/07/2018  . Cervical pain (neck) 10/13/2016  . 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 Long term current use of systemic steroids 01/07/2016  . Neutropenia ([place]) 01/07/2016  . Anxiety 04/08/2015  . 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 Chronic constipation 04/08/2015  . 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 Failure of erection 04/08/2015  . H/O inguinal hernia repair 04/08/2015  . HLD (hyperlipidemia) 04/08/2015  . 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 LBP (low back pain) 04/08/2015  . 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 Drug intolerance 04/08/2015  . Kidney lump 04/08/2015  . TI (tricuspid incompetence) 04/08/2015  . Unilateral recurrent femoral hernia without  obstruction or gangrene 05/23/2014  . Femoral hernia 05/23/2014  . Right inguinal hernia 02/06/2014  . Cataract 01/16/2014  . Glaucoma 01/16/2014  . Reflux esophagitis 06/21/2009  . Coronary atherosclerosis 04/03/2009  . Benign enlargement of prostate 02/11/2009  . Decreased libido 01/12/2008  . Benign essential HTN 09/12/2007    Goals Addressed   None    Called patient and discussed medication adherence   with patient, no issues at this time with current medication.   Patient denies ED visit since his last CPP follow up.  Patient denies any side effects with his medication. Patient denies any problems with his current pharmacy   Patient states his blood pressure is ranging around 125/60. Performed cost analysis for patient, estimated yearly medication cost of $60.00.  Follow-Up:  Pharmacist Review   Bessie Westervelt Pharmacist Assistant [phone]

## 2021-01-01 ENCOUNTER — Telehealth: Payer: Self-pay

## 2021-01-01 NOTE — Progress Notes (Signed)
Left a voice message to confirmed patient telephone appointment on 01/02/2021 for CCM at 8:30 am with [name] the Clinical pharmacist.   [place] Pharmacist Assistant [phone]

## 2021-01-02 ENCOUNTER — Ambulatory Visit (INDEPENDENT_AMBULATORY_CARE_PROVIDER_SITE_OTHER): Payer: Medicare Other

## 2021-01-02 DIAGNOSIS — E785 Hyperlipidemia, unspecified: Secondary | ICD-10-CM

## 2021-01-02 DIAGNOSIS — H42 Glaucoma in diseases classified elsewhere: Secondary | ICD-10-CM | POA: Diagnosis not present

## 2021-01-02 DIAGNOSIS — E1169 Type 2 diabetes mellitus with other specified complication: Secondary | ICD-10-CM

## 2021-01-02 DIAGNOSIS — E1136 Type 2 diabetes mellitus with diabetic cataract: Secondary | ICD-10-CM | POA: Diagnosis not present

## 2021-01-02 DIAGNOSIS — E1139 Type 2 diabetes mellitus with other diabetic ophthalmic complication: Secondary | ICD-10-CM

## 2021-01-02 NOTE — Patient Instructions (Signed)
Visit Information It was great speaking with you today!  Please let me know if you have any questions about our visit.  Goals Addressed            This Visit's Progress   . Monitor and Manage My Blood Sugar-Diabetes Type 2       Timeframe:  Long-Range Goal Priority:  High Start Date:    01/02/21                         Expected End Date:  01/07/22                     Follow Up Date 05/01/2021    -check blood sugar daily before breakfast - check blood sugar if I feel it is too high or too low - enter blood sugar readings and medication or insulin into daily log    Why is this important?    Checking your blood sugar at home helps to keep it from getting very high or very low.   Writing the results in a diary or log helps the doctor know how to care for you.   Your blood sugar log should have the time, date and the results.   Also, write down the amount of insulin or other medicine that you take.   Other information, like what you ate, exercise done and how you were feeling, will also be helpful.     Notes:        Patient Care Plan: General Pharmacy (Adult)    Problem Identified: Hypertension, Hyperlipidemia, Diabetes and Coronary Artery Disease   Priority: High    Long-Range Goal: Patient-Specific Goal   Start Date: 01/02/2021  Expected End Date: 01/07/2022  This Visit's Progress: On track  Priority: High  Note:   Current Barriers:  . No barriers noted  Pharmacist Clinical Goal(s):  Marland Kitchen Over the next 90 days, patient will maintain control of diabetes as evidenced by A1c less than 8%  through collaboration with PharmD and provider.   Interventions: . 1:1 collaboration with [name], MD regarding development and update of comprehensive plan of care as evidenced by provider attestation and co-signature . Inter-disciplinary care team collaboration (see longitudinal plan of care) . Comprehensive medication review performed; medication list updated in electronic medical  record  Hypertension (BP goal <140/90) -Controlled -Current treatment: . None -Medications previously tried: Ramipril, Imdur, Metoprolol,   -Current home readings: NA -Current dietary habits: Patient trying to eat healthy, minimizing salt intake, watching sweets and fatty foods. Patient has had significant unexplained weight loss over past 3 years, stable recently. -Current exercise habits: exercise at gym 3-4 times weekly (treadmill, bike for 40-45 minutes)  -Denies hypotensive/hypertensive symptoms -Educated on Daily salt intake goal < 2300 mg; Exercise goal of 150 minutes per week; -Counseled to monitor BP at home weekly, document, and provide log at future appointments -Recommended to continue current medication  Hyperlipidemia: (LDL goal < 70) -Uncontrolled -Current treatment: . Ezetimibe 10 mg daily  -Medications previously tried: "statins" colesevelam   -Educated on Importance of limiting foods high in cholesterol;  -Patient interested if he might be able to stop ezetimibe at some point. Discussed the risks vs benefits of cholesterol therapy in secondary ASCVD prevention. Counseled patient to reach out to Cardiology for final discussion on if he can come off cholesterol therapy.  -Recommended to continue current medication  Diabetes (A1c goal <8%) -Controlled -Current medications: Marland Kitchen Metformin XR 500 mg  2 tablets AM, 1 tablet PM  -Medications previously tried: NA  -Current home glucose readings . fasting glucose: 114, 98-120 . post prandial glucose: NA -Denies hypoglycemic/hyperglycemic symptoms -Educated on A1c and blood sugar goals; -Counseled to check feet daily and get yearly eye exams -Recommended to continue current medication   Patient Goals/Self-Care Activities . Over the next 90 days, patient will:  - check glucose daily before breakfast, document, and provide at future appointments check blood pressure weekly, document, and provide at future  appointments  Follow Up Plan: Telephone follow up appointment with care management team member scheduled for: 01/07/2022 at 9:00 AM       Patient agreed to services and verbal consent obtained.   The patient verbalized understanding of instructions, educational materials, and care plan provided today and declined offer to receive copy of patient instructions, educational materials, and care plan.   [facility] [phone]

## 2021-01-02 NOTE — Progress Notes (Signed)
Chronic Care Management Pharmacy Note  01/02/2021 Name:  Austin Hunt MRN:  [id number] DOB:  [birth year]  Subjective: Austin Hunt is an 80 y.o. year old male who is a primary patient of [name], MD.  The CCM team was consulted for assistance with disease management and care coordination needs.    Engaged with patient by telephone for follow up visit in response to provider referral for pharmacy case management and/or care coordination services.   Consent to Services:  The patient was given the following information about Chronic Care Management services today, agreed to services, and gave verbal consent: 1. CCM service includes personalized support from designated clinical staff supervised by the primary care provider, including individualized plan of care and coordination with other care providers 2. 24/7 contact phone numbers for assistance for urgent and routine care needs. 3. Service will only be billed when office clinical staff spend 20 minutes or more in a month to coordinate care. 4. Only one practitioner may furnish and bill the service in a calendar month. 5.The patient may stop CCM services at any time (effective at the end of the month) by phone call to the office staff. 6. The patient will be responsible for cost sharing (co-pay) of up to 20% of the service fee (after annual deductible is met). Patient agreed to services and consent obtained.  Patient Care Team: [name], MD as PCP - General (Family Medicine) Bary Castilla, Forest Gleason, MD (General Surgery) Yolonda Kida, MD as Consulting Physician (Cardiology) Anabel Bene, MD as Referring Physician (Neurology) Hollice Espy, MD as Consulting Physician (Urology) Leandrew Koyanagi, MD as Consulting Physician (Ophthalmology) Gardiner Barefoot, DPM as Consulting Physician (Podiatry) Beverly Gust, MD (Otolaryngology) Germaine Pomfret, [facility] (Pharmacist)  Recent office visits: 11/05/2020: Patient presented  to [name] for follow-up. Hydrocortisone stopped.   Recent consult visits: 11/26/20: Patient presented to Dr. Tasia Catchings (Oncology) for follow-up.  09/20/20: Patient presented to Gladstone Pih, NP (Cardiology) for follow-up  Hospital visits: None in previous 6 months  Objective:  Lab Results  Component Value Date   CREATININE 0.83 11/05/2020   BUN 16 11/05/2020   GFRNONAA 83 11/05/2020   GFRAA 96 11/05/2020   NA 137 11/05/2020   K 4.3 11/05/2020   CALCIUM 9.7 11/05/2020   CO2 32 11/05/2020    Lab Results  Component Value Date/Time   HGBA1C 7.1 (H) 11/05/2020 09:57 AM   HGBA1C 7.3 (H) 12/15/2019 10:35 AM   HGBA1C 7.2 10/19/2018 02:11 PM   HGBA1C 7.2 (A) 10/19/2018 02:11 PM   HGBA1C 7.2 (A) 10/19/2018 02:11 PM   MICROALBUR 0.4 06/21/2020 09:10 AM   MICROALBUR 0.2 06/20/2019 09:00 AM   MICROALBUR 20 01/04/2017 10:45 AM    Last diabetic Eye exam:  Lab Results  Component Value Date/Time   HMDIABEYEEXA No Retinopathy 08/09/2020 12:00 AM    Last diabetic Foot exam:  Lab Results  Component Value Date/Time   HMDIABFOOTEX Abnormal 04/25/2018 12:00 AM   HMDIABFOOTEX Abnormal 04/25/2018 12:00 AM     Lab Results  Component Value Date   CHOL 207 (H) 11/05/2020   HDL 60 11/05/2020   LDLCALC 128 (H) 11/05/2020   TRIG 88 11/05/2020   CHOLHDL 3.5 11/05/2020    Hepatic Function Latest Ref Rng & Units 11/05/2020 06/21/2020 12/15/2019  Total Protein 6.1 - 8.1 g/dL 7.3 6.8 6.8  Albumin 3.6 - 5.1 g/dL - - -  AST 10 - 35 U/L 20 23 17   ALT 9 - 46 U/L 11  14 11  Alk Phosphatase 40 - 115 U/L - - -  Total Bilirubin 0.2 - 1.2 mg/dL 0.6 0.6 0.5    Lab Results  Component Value Date/Time   TSH 2.856 11/12/2020 11:01 AM   TSH 3.08 12/15/2019 10:35 AM   TSH 1.96 09/11/2019 12:00 AM    CBC Latest Ref Rng & Units 11/12/2020 11/05/2020 06/21/2020  WBC 4.0 - 10.5 K/uL 3.3(L) 3.0(L) 3.3(L)  Hemoglobin 13.0 - 17.0 g/dL 15.3 14.8 14.9  Hematocrit 39.0 - 52.0 % 47.3 43.6 45.3  Platelets 150 - 400  K/uL 227 231 215    Lab Results  Component Value Date/Time   VD25OH 61 12/15/2019 10:35 AM   VD25OH 31 05/24/2018 10:54 AM    Clinical ASCVD: Yes  The ASCVD Risk score Mikey Bussing DC Jr., et al., 2013) failed to calculate for the following reasons:   The 2013 ASCVD risk score is only valid for ages 24 to 70    Depression screen PHQ 2/9 11/05/2020 09/18/2020 06/21/2020  Decreased Interest 0 0 0  Down, Depressed, Hopeless 0 0 0  PHQ - 2 Score 0 0 0  Altered sleeping - - 0  Tired, decreased energy - - 0  Change in appetite - - 0  Feeling bad or failure about yourself  - - 0  Trouble concentrating - - 0  Moving slowly or fidgety/restless - - 0  Suicidal thoughts - - 0  PHQ-9 Score - - 0  Difficult doing work/chores - - -  Some recent data might be hidden    Social History   Tobacco Use  Smoking Status Never Smoker  Smokeless Tobacco Never Used  Tobacco Comment   smoking cessation materials not required   BP Readings from Last 3 Encounters:  11/26/20 (!) 139/55  11/12/20 (!) 153/80  11/05/20 132/70   Pulse Readings from Last 3 Encounters:  11/26/20 63  11/12/20 60  11/05/20 62   Wt Readings from Last 3 Encounters:  11/26/20 134 lb 1.6 oz (60.8 kg)  11/12/20 135 lb 3.2 oz (61.3 kg)  11/05/20 132 lb 1.6 oz (59.9 kg)    Assessment/Interventions: Review of patient past medical history, allergies, medications, health status, including review of consultants reports, laboratory and other test data, was performed as part of comprehensive evaluation and provision of chronic care management services.   SDOH:  (Social Determinants of Health) assessments and interventions performed: Yes   CCM Care Plan  Allergies  Allergen Reactions  . Colesevelam Other (See Comments)  . Penicillins Itching and Other (See Comments)  . Statins Other (See Comments)    myalgia  . Welchol [Colesevelam Hcl]     Medications Reviewed Today    Reviewed by Evelina Dun, RN (Registered Nurse)  on 11/26/20 at 49  Med List Status: <None>  Medication Order Taking? Sig Documenting Provider Last Dose Status Informant  Accu-Chek Softclix Lancets lancets [id number]  TEST 4 TIMES DAILY [name], MD  Active   aspirin 81 MG tablet [id number]  Take 81 mg by mouth daily.  [provider]  Active Self  Blood Glucose Monitoring Suppl (ACCU-CHEK GUIDE ME) w/Device KIT [id number]   [provider]  Active   brimonidine-timolol (COMBIGAN) 0.2-0.5 % ophthalmic solution [id number]  Place 1 drop into both eyes every 12 (twelve) hours. [provider]  Active Self  Cholecalciferol (VITAMIN D) 50 MCG (2000 UT) CAPS [id number]  Take 1 capsule (2,000 Units total) by mouth daily. [name], MD  Active   Cyanocobalamin (  B-12) 1000 MCG SUBL [id number]  Place 1 tablet under the tongue daily. [name], MD  Active   dorzolamide (TRUSOPT) 2 % ophthalmic solution [id number]  1 drop 2 (two) times daily.  [provider]  Active Self           Med Note Despina Hidden   Fri Apr 26, 2020  9:38 AM)    ezetimibe (ZETIA) 10 MG tablet [id number]  TAKE 1 TABLET BY MOUTH ONCE DAILY Ancil Boozer, Drue Stager, MD  Active   gabapentin (NEURONTIN) 100 MG capsule [id number]  Take 1-3 capsules (100-300 mg total) by mouth at bedtime. For itching on foot  Patient taking differently: Take 100-300 mg by mouth at bedtime. For itching on foot Open capsules and empty into applesauce to avoid gelatin   [name], MD  Active   Garlic 10 MG CAPS [id number]  Take 15 mg by mouth daily.  [provider]  Active Self           Med Note Tamala Julian, Valentino Nose   Fri Apr 26, 2020  9:38 AM)    gentamicin ointment (GARAMYCIN) 0.1 % [id number]  Apply topically. [provider]  Active   glucose blood (ACCU-CHEK ACTIVE STRIPS) test strip [id number]  Use as instructed [name], MD  Active   ketoconazole (NIZORAL) 2 % cream [id number]  SMARTSIG:1 Application Topical 1 to 2 Times Daily  [provider]  Active   latanoprost (XALATAN) 0.005 % ophthalmic solution [id number]  Place 1 drop into both eyes at bedtime.  [provider]  Active Self  Magnesium 100 MG TABS [id number]  Take by mouth.  [provider]  Active   metFORMIN (GLUCOPHAGE-XR) 500 MG 24 hr tablet [id number]  TAKE 2 TABLETS BY MOUTH ONCE EVERY MORNING AND 1 TABLET AT BEDTIME [name], MD  Active   Multiple Vitamin (MULTIVITAMIN) tablet [id number]  Take 1 tablet by mouth daily. [provider]  Active Self  OMEGA-3 FATTY ACIDS PO [id number]  Take 250 mg by mouth daily.  [provider]  Active Self           Med Note Tamala Julian, Valentino Nose   Fri Apr 26, 2020  9:38 AM)    tadalafil (CIALIS) 20 MG tablet [id number]  TAKE 0.5-1 TABLET BY MOUTH EVERY OTHER DAY AS NEEDED FOR ERECTILE DYSFUNCTION [name], MD  Active   triamcinolone (KENALOG) 0.025 % cream [id number]  Apply topically. [provider]  Active   vitamin C (ASCORBIC ACID) 500 MG tablet [id number]  Take 500 mg by mouth 2 (two) times daily. Chewable [provider]  Active Self  Zinc Acetate 50 MG CAPS [id number]  Take 1 capsule by mouth daily.  [provider]  Active Self          Patient Active Problem List   Diagnosis Date Noted  . Statin intolerance 06/21/2020  . 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 ([place]) 06/21/2020  . Prostate cancer ([place]) 02/07/2018  . Cervical pain (neck) 10/13/2016  . 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 Long term current use of systemic steroids 01/07/2016  . Neutropenia ([place]) 01/07/2016  . Anxiety 04/08/2015  . 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 Chronic constipation 04/08/2015  . 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 Failure of erection 04/08/2015  . H/O inguinal hernia repair 04/08/2015  . HLD (hyperlipidemia)  04/08/2015  . Adaptive colitis  04/08/2015  . LBP (low back pain) 04/08/2015  . 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 Drug intolerance 04/08/2015  . Kidney lump 04/08/2015  . TI (tricuspid incompetence) 04/08/2015  . Unilateral recurrent femoral hernia without obstruction or gangrene 05/23/2014  . Femoral hernia 05/23/2014  . Right inguinal hernia 02/06/2014  . Cataract 01/16/2014  . Glaucoma 01/16/2014  . Reflux esophagitis 06/21/2009  . Coronary atherosclerosis 04/03/2009  . Benign enlargement of prostate 02/11/2009  . Decreased libido 01/12/2008  . Benign essential HTN 09/12/2007    Immunization History  Administered Date(s) Administered  . Fluad Quad(high Dose 65+) 08/01/2019, 08/19/2020  . Influenza Split 07/02/2010  . Influenza, High Dose Seasonal PF 08/14/2015, 07/30/2016, 08/19/2017, 08/09/2018  . Influenza, Seasonal, Injecte, Preservative Fre 07/08/2011, 07/22/2012  . Influenza,inj,Quad PF,6+ Mos 07/11/2013  . PFIZER(Purple Top)SARS-COV-2 Vaccination 11/22/2019, 12/16/2019, 08/04/2020  . Pneumococcal Conjugate-13 01/22/2014  . Pneumococcal-Unspecified 01/03/2008  . Tdap 11/04/2010, 11/05/2020  . Zoster 06/14/2008  . Zoster Recombinat (Shingrix) 09/13/2020    Conditions to be addressed/monitored:  Hypertension, Hyperlipidemia, Diabetes and Coronary Artery Disease  There are no care plans that you recently modified to display for this patient.    Medication Assistance: None required.  Patient affirms current coverage meets needs.  Patient's preferred pharmacy is:  [facility], [place] [address] Phone: [phone] Fax: [facility], [address] [address] [place] [address] Phone: [phone] Fax: [phone]  Uses pill box? Yes Pt endorses 100% compliance  We discussed: Current pharmacy is preferred with insurance plan and  patient is satisfied with pharmacy services Patient decided to: Continue current medication management strategy  Care Plan and Follow Up Patient Decision:  Patient agrees to Care Plan and Follow-up.  Plan: Telephone follow up appointment with care management team member scheduled for:  01/07/2022 at 9:00 AM  [facility] [phone]

## 2021-01-30 DIAGNOSIS — Z23 Encounter for immunization: Secondary | ICD-10-CM | POA: Diagnosis not present

## 2021-02-06 ENCOUNTER — Other Ambulatory Visit: Payer: Self-pay | Admitting: Family Medicine

## 2021-02-06 DIAGNOSIS — N521 Erectile dysfunction due to diseases classified elsewhere: Secondary | ICD-10-CM

## 2021-02-06 DIAGNOSIS — H42 Glaucoma in diseases classified elsewhere: Secondary | ICD-10-CM

## 2021-02-06 DIAGNOSIS — E1140 Type 2 diabetes mellitus with diabetic neuropathy, unspecified: Secondary | ICD-10-CM

## 2021-02-06 DIAGNOSIS — E1139 Type 2 diabetes mellitus with other diabetic ophthalmic complication: Secondary | ICD-10-CM

## 2021-02-24 ENCOUNTER — Inpatient Hospital Stay: Payer: Medicare Other | Attending: Oncology

## 2021-02-24 DIAGNOSIS — R634 Abnormal weight loss: Secondary | ICD-10-CM | POA: Diagnosis not present

## 2021-02-24 DIAGNOSIS — D72819 Decreased white blood cell count, unspecified: Secondary | ICD-10-CM | POA: Diagnosis not present

## 2021-02-24 DIAGNOSIS — Z7984 Long term (current) use of oral hypoglycemic drugs: Secondary | ICD-10-CM | POA: Diagnosis not present

## 2021-02-24 DIAGNOSIS — Z8546 Personal history of malignant neoplasm of prostate: Secondary | ICD-10-CM | POA: Diagnosis not present

## 2021-02-24 DIAGNOSIS — C61 Malignant neoplasm of prostate: Secondary | ICD-10-CM

## 2021-02-24 LAB — CBC WITH DIFFERENTIAL/PLATELET
Abs Immature Granulocytes: 0 10*3/uL (ref 0.00–0.07)
Basophils Absolute: 0 10*3/uL (ref 0.0–0.1)
Basophils Relative: 0 %
Eosinophils Absolute: 0.1 10*3/uL (ref 0.0–0.5)
Eosinophils Relative: 4 %
HCT: 44.9 % (ref 39.0–52.0)
Hemoglobin: 14.5 g/dL (ref 13.0–17.0)
Immature Granulocytes: 0 %
Lymphocytes Relative: 34 %
Lymphs Abs: 1.2 10*3/uL (ref 0.7–4.0)
MCH: 29.2 pg (ref 26.0–34.0)
MCHC: 32.3 g/dL (ref 30.0–36.0)
MCV: 90.3 fL (ref 80.0–100.0)
Monocytes Absolute: 0.3 10*3/uL (ref 0.1–1.0)
Monocytes Relative: 8 %
Neutro Abs: 1.8 10*3/uL (ref 1.7–7.7)
Neutrophils Relative %: 54 %
Platelets: 201 10*3/uL (ref 150–400)
RBC: 4.97 MIL/uL (ref 4.22–5.81)
RDW: 12.5 % (ref 11.5–15.5)
WBC: 3.4 10*3/uL — ABNORMAL LOW (ref 4.0–10.5)
nRBC: 0 % (ref 0.0–0.2)

## 2021-02-24 LAB — COMPREHENSIVE METABOLIC PANEL
ALT: 16 U/L (ref 0–44)
AST: 25 U/L (ref 15–41)
Albumin: 4.1 g/dL (ref 3.5–5.0)
Alkaline Phosphatase: 58 U/L (ref 38–126)
Anion gap: 11 (ref 5–15)
BUN: 21 mg/dL (ref 8–23)
CO2: 28 mmol/L (ref 22–32)
Calcium: 9.5 mg/dL (ref 8.9–10.3)
Chloride: 98 mmol/L (ref 98–111)
Creatinine, Ser: 0.84 mg/dL (ref 0.61–1.24)
GFR, Estimated: >60 mL/min (ref >60)
Glucose, Bld: 272 mg/dL — ABNORMAL HIGH (ref 70–99)
Potassium: 4 mmol/L (ref 3.5–5.1)
Sodium: 137 mmol/L (ref 135–145)
Total Bilirubin: 0.7 mg/dL (ref 0.3–1.2)
Total Protein: 7.3 g/dL (ref 6.5–8.1)

## 2021-02-24 LAB — LACTATE DEHYDROGENASE: LDH: 137 U/L (ref 98–192)

## 2021-02-26 LAB — COMP PANEL: LEUKEMIA/LYMPHOMA

## 2021-02-27 ENCOUNTER — Inpatient Hospital Stay: Payer: Medicare Other | Admitting: Oncology

## 2021-02-28 ENCOUNTER — Encounter: Payer: Self-pay | Admitting: Oncology

## 2021-02-28 ENCOUNTER — Inpatient Hospital Stay (HOSPITAL_BASED_OUTPATIENT_CLINIC_OR_DEPARTMENT_OTHER): Payer: Medicare Other | Admitting: Oncology

## 2021-02-28 VITALS — BP 155/73 | HR 65 | Temp 98.0°F | Resp 16 | Wt 132.0 lb

## 2021-02-28 DIAGNOSIS — D72819 Decreased white blood cell count, unspecified: Secondary | ICD-10-CM | POA: Diagnosis not present

## 2021-02-28 DIAGNOSIS — R634 Abnormal weight loss: Secondary | ICD-10-CM | POA: Diagnosis not present

## 2021-02-28 DIAGNOSIS — C61 Malignant neoplasm of prostate: Secondary | ICD-10-CM

## 2021-02-28 DIAGNOSIS — Z7984 Long term (current) use of oral hypoglycemic drugs: Secondary | ICD-10-CM | POA: Diagnosis not present

## 2021-02-28 DIAGNOSIS — Z8546 Personal history of malignant neoplasm of prostate: Secondary | ICD-10-CM | POA: Diagnosis not present

## 2021-02-28 NOTE — Progress Notes (Signed)
Hematology/Oncology Consult note [facility] [phone] [phone]   Patient Care Team: [name], MD as PCP - General (Family Medicine) Bary Castilla, Forest Gleason, MD (General Surgery) Yolonda Kida, MD as Consulting Physician (Cardiology) Anabel Bene, MD as Referring Physician (Neurology) Hollice Espy, MD as Consulting Physician (Urology) Leandrew Koyanagi, MD as Consulting Physician (Ophthalmology) Gardiner Barefoot, DPM as Consulting Physician (Podiatry) Beverly Gust, MD (Otolaryngology) Germaine Pomfret, [facility] (Pharmacist)  REFERRING PROVIDER: [name], MD  CHIEF COMPLAINTS/REASON FOR VISIT:   leukopenia  HISTORY OF PRESENTING ILLNESS:  Austin Hunt is a 80 y.o. male who was seen in consultation at the request of [name], MD for evaluation of leukopenia. Reviewed patient's recent labs. Patient has low total WBC count was 3.0, no differential abnormalities.   Previous lab records reviewed. Leukopenia duration is chronic onset, duration is since 2017.  Patient has had neutropenia previously, not neutropenic on most recent 2 blood work done. No aggravating or improving factors.  Associated symptoms:  denies fatigue,fever, chills, frequent infection.  History hepatitis or HIV infection: Denies History of chronic liver disease: Denies History of blood transfusion: Denies Alcohol consumption: Denies Diet Vegetarian or Vegan: Denies Herbal medication: Patient takes red clover tea, peppermint tea.   Denies any frequent infections. He reports having significant weight loss in the last year.  Patient reports that he used to weigh 150 -175 pounds and now only in the 120s.  Reviewed his weight was recorded during his clinical visit with other providers. June 2019 142 pounds June 2020 135 pounds January 2021, 133 pounds April 2021, 133 pounds August 2021, 134 pounds October 2021, 133 pounds   Patient  has a history of prostate cancer, Gleason 3+3 identified on TURP specimen in 2015.  Patient has been followed up by urology Dr. Erlene Quan.  Most recent PSA was done on 04/26/2020 which was 2.12.   INTERVAL HISTORY Austin Hunt is a 80 y.o. male who has above history reviewed by me today presents for follow up visit for management of leukopenia Problems and complaints are listed below:  He reports no new complaints.He has experienced weight loss for the past few years.  No other constitutional symptoms. He attributes to being on Metformin.    Review of Systems  Constitutional: Negative for appetite change, chills, fatigue, fever and unexpected weight change.  HENT:   Negative for hearing loss and voice change.   Eyes: Negative for eye problems and icterus.  Respiratory: Negative for chest tightness, cough and shortness of breath.   Cardiovascular: Negative for chest pain and leg swelling.  Gastrointestinal: Negative for abdominal distention and abdominal pain.  Endocrine: Negative for hot flashes.  Genitourinary: Negative for difficulty urinating, dysuria and frequency.   Musculoskeletal: Negative for arthralgias.  Skin: Negative for itching and rash.  Neurological: Negative for light-headedness and numbness (.yulab).  Hematological: Negative for adenopathy. Does not bruise/bleed easily.  Psychiatric/Behavioral: Negative for confusion.    MEDICAL HISTORY:  Past Medical History:  Diagnosis Date  . 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 Balanitis   . BPH (benign prostatic hypertrophy) with urinary obstruction   . 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 Diabetes mellitus without complication ([place])   . Glaucoma   . Headache    rare - 1x/mo  . Nocturia   . Prostate cancer [facility])     SURGICAL HISTORY: Past Surgical History:  Procedure Laterality Date  . APPENDECTOMY  1954  . BLADDER SURGERY  Oct  2015  . CARDIAC CATHETERIZATION  2002   1 stent  . COLONOSCOPY    . EYE SURGERY  2011, 2013    cataracts  . EYE SURGERY Right May 2016   glaucoma  . HERNIA REPAIR Left 1967  . HERNIA REPAIR Right 05-10-14   Right femoral hernia repair Dr Bary Castilla with PerFix plug, no onlay mesh..   . PHOTOCOAGULATION WITH LASER Right 12/30/2015   Procedure: PHOTOCOAGULATION WITH LASER;  Surgeon: Ronnell Freshwater, MD;  Location: [facility];  Service: Ophthalmology;  Laterality: Right;  DIABETIC - oral meds IVA BLOCK  . PROSTATE SURGERY  Oct 2015    SOCIAL HISTORY: Social History   Socioeconomic History  . Marital status: Married    Spouse name: Onalee Hua  . Number of children: 4  . Years of education: some college  . Highest education level: 12th grade  Occupational History    Employer: RETIRED  Tobacco Use  . Smoking status: Never Smoker  . Smokeless tobacco: Never Used  . Tobacco comment: smoking cessation materials not required  Vaping Use  . Vaping Use: Never used  Substance and Sexual Activity  . Alcohol use: No  . Drug use: No  . Sexual activity: Yes  Other Topics Concern  . Not on file  Social History Narrative  . Not on file   Social Determinants of Health   Financial Resource Strain: Low Risk   . Difficulty of Paying Living Expenses: Not hard at all  Food Insecurity: No Food Insecurity  . Worried About Charity fundraiser in the Last Year: Never true  . Ran Out of Food in the Last Year: Never true  Transportation Needs: No Transportation Needs  . Lack of Transportation (Medical): No  . Lack of Transportation (Non-Medical): No  Physical Activity: Sufficiently Active  . Days of Exercise per Week: 4 days  . Minutes of Exercise per Session: 40 min  Stress: No Stress Concern Present  . Feeling of Stress : Not at all  Social Connections: Socially Integrated  . Frequency of Communication with Friends and Family: More than three times a week  . Frequency of Social Gatherings with Friends and Family: More than three times a week  . Attends Religious Services:  More than 4 times per year  . Active Member of Clubs or Organizations: Yes  . Attends Archivist Meetings: More than 4 times per year  . Marital Status: Married  Human resources officer Violence: Not At Risk  . Fear of Current or Ex-Partner: No  . Emotionally Abused: No  . Physically Abused: No  . Sexually Abused: No    FAMILY HISTORY: Family History  Problem Relation Age of Onset  . Diabetes Sister   . Diabetes Brother   . Diabetes Sister   . Prostate cancer Neg Hx   . Bladder Cancer Neg Hx     ALLERGIES:  is allergic to colesevelam, penicillins, statins, and welchol [colesevelam hcl].  MEDICATIONS:  Current Outpatient Medications  Medication Sig Dispense Refill  . Accu-Chek Softclix Lancets lancets TEST 4 TIMES DAILY 100 each 5  . aspirin 81 MG tablet Take 81 mg by mouth daily.     . Blood Glucose Monitoring Suppl (ACCU-CHEK GUIDE ME) w/Device KIT     . brimonidine-timolol (COMBIGAN) 0.2-0.5 % ophthalmic solution Place 1 drop into both eyes every 12 (twelve) hours.    . Cholecalciferol (VITAMIN D) 50 MCG (2000 UT) CAPS Take 1 capsule (2,000 Units total) by mouth daily. 30 capsule  0  . Cyanocobalamin (B-12) 1000 MCG SUBL Place 1 tablet under the tongue daily. 30 tablet 0  . dorzolamide (TRUSOPT) 2 % ophthalmic solution 1 drop 2 (two) times daily.     Marland Kitchen ezetimibe (ZETIA) 10 MG tablet TAKE 1 TABLET BY MOUTH ONCE DAILY 90 tablet 3  . gabapentin (NEURONTIN) 100 MG capsule Take 1-3 capsules (100-300 mg total) by mouth at bedtime. For itching on foot (Patient taking differently: Take 100-300 mg by mouth at bedtime. For itching on foot Open capsules and empty into applesauce to avoid gelatin) 90 capsule 0  . Garlic 10 MG CAPS Take 15 mg by mouth daily.     Marland Kitchen gentamicin ointment (GARAMYCIN) 0.1 % Apply topically.    Marland Kitchen glucose blood (ACCU-CHEK ACTIVE STRIPS) test strip Use as instructed 100 each 12  . ketoconazole (NIZORAL) 2 % cream SMARTSIG:1 Application Topical 1 to 2 Times Daily     . latanoprost (XALATAN) 0.005 % ophthalmic solution Place 1 drop into both eyes at bedtime.     . Magnesium 100 MG TABS Take by mouth.     . metFORMIN (GLUCOPHAGE-XR) 500 MG 24 hr tablet TAKE 2 TABLETS BY MOUTH ONCE EVERY MORNING AND 1 TABLET AT BEDTIME 270 tablet 1  . Multiple Vitamin (MULTIVITAMIN) tablet Take 1 tablet by mouth daily.    . OMEGA-3 FATTY ACIDS PO Take 250 mg by mouth daily.     . tadalafil (CIALIS) 20 MG tablet TAKE 0.5-1 TABLET BY MOUTH EVERY OTHER DAY AS NEEDED FOR ERECTILE DYSFUNCTION 30 tablet 0  . triamcinolone (KENALOG) 0.025 % cream Apply topically.    . vitamin C (ASCORBIC ACID) 500 MG tablet Take 500 mg by mouth 2 (two) times daily. Chewable    . Zinc Acetate 50 MG CAPS Take 1 capsule by mouth daily.      No current facility-administered medications for this visit.     PHYSICAL EXAMINATION: ECOG PERFORMANCE STATUS: 1 - Symptomatic but completely ambulatory Vitals:   02/28/21 1450  BP: (!) 155/73  Pulse: 65  Resp: 16  Temp: 98 F (36.7 C)  SpO2: 100%   Filed Weights   02/28/21 1450  Weight: 132 lb (59.9 kg)    Physical Exam Constitutional:      General: He is not in acute distress. HENT:     Head: Normocephalic and atraumatic.  Eyes:     General: No scleral icterus. Cardiovascular:     Rate and Rhythm: Normal rate and regular rhythm.     Heart sounds: Normal heart sounds.  Pulmonary:     Effort: Pulmonary effort is normal. No respiratory distress.     Breath sounds: No wheezing.  Abdominal:     General: Bowel sounds are normal. There is no distension.     Palpations: Abdomen is soft.  Musculoskeletal:        General: No deformity. Normal range of motion.     Cervical back: Normal range of motion and neck supple.  Skin:    General: Skin is warm and dry.     Findings: No erythema or rash.  Neurological:     Mental Status: He is alert and oriented to person, place, and time. Mental status is at baseline.     Cranial Nerves: No cranial  nerve deficit.     Coordination: Coordination normal.  Psychiatric:        Mood and Affect: Mood normal.     RADIOGRAPHIC STUDIES: I have personally reviewed the radiological images as listed and  agreed with the findings in the report.  CMP Latest Ref Rng & Units 02/24/2021  Glucose 70 - 99 mg/dL 272(H)  BUN 8 - 23 mg/dL 21  Creatinine 0.61 - 1.24 mg/dL 0.84  Sodium 135 - 145 mmol/L 137  Potassium 3.5 - 5.1 mmol/L 4.0  Chloride 98 - 111 mmol/L 98  CO2 22 - 32 mmol/L 28  Calcium 8.9 - 10.3 mg/dL 9.5  Total Protein 6.5 - 8.1 g/dL 7.3  Total Bilirubin 0.3 - 1.2 mg/dL 0.7  Alkaline Phos 38 - 126 U/L 58  AST 15 - 41 U/L 25  ALT 0 - 44 U/L 16   CBC Latest Ref Rng & Units 02/24/2021  WBC 4.0 - 10.5 K/uL 3.4(L)  Hemoglobin 13.0 - 17.0 g/dL 14.5  Hematocrit 39.0 - 52.0 % 44.9  Platelets 150 - 400 K/uL 201    LABORATORY DATA:  I have reviewed the data as listed Lab Results  Component Value Date   WBC 3.4 (L) 02/24/2021   HGB 14.5 02/24/2021   HCT 44.9 02/24/2021   MCV 90.3 02/24/2021   PLT 201 02/24/2021   Recent Labs    06/21/20 0910 11/05/20 0957 02/24/21 1201  NA 138 137 137  K 4.5 4.3 4.0  CL 100 98 98  CO2 28 32 28  GLUCOSE 124* 130* 272*  BUN _0 CREATININE 0.95 0.83 0.84  CALCIUM 9.5 9.7 9.5  GFRNONAA 75 83 >60  GFRAA 87 96  --   PROT 6.8 7.3 7.3  ALBUMIN  --   --  4.1  AST _1 ALT _2 ALKPHOS  --   --  58  BILITOT 0.6 0.6 0.7   Iron/TIBC/Ferritin/ %Sat No results found for: IRON, TIBC, FERRITIN, IRONPCTSAT   RADIOGRAPHIC STUDIES: I have personally reviewed the radiological images as listed and agreed with the findings in the report. No results found.    ASSESSMENT & PLAN:  1. Leukopenia, unspecified type   2. Weight loss   3. Prostate cancer ([place])    # Leukopenia Labs are reviewed and discussed with patient. Mild leukopenia, with normal differential Previous work-up showed.  Normal B12 and folate level, LDH.  Negative  hepatitis and HIV.  SPEP showed no M protein. Mildly elevated Light chain ration. UPEP showed no M protein.  11/12/2020 Flowcytometry showed minute population of polyclonal plasmablasts, 1%. No other significant immunophenotypic abnormality detected. - discussed with him , probably reactive changes due to recent vaccination. Monitor.  02/25/2019 2 repeat flow cytometry showed no significant immunophenotypic abnormality detected.  No circulating blasts are detected.   Weight loss, according to previous records, weight seems to be stable for the past 1.5 year. He has lost 5-7 pounds comparing to his weight in 2019 or earlier.  Patient attributes to metformin use.  He otherwise does not have constitutional symptoms I advised patient to continue follow-up with primary care provider to monitor his weight.  Consider CT scan if he continues to drop a lot of weight.   History of prostate cancer, Gleason 3+3, followed by urologist. On observation.   Patient will be discharged from my clinic.  He may come back if he develops new concerning symptoms or if clinically indicated. Patient agrees with the plan. All questions were answered. The patient knows to call the clinic with any problems questions or concerns.  Cc [name], MD      Earlie Server, MD, PhD Hematology Oncology [facility] at  [facility] Pager- [phone] 02/28/2021

## 2021-02-28 NOTE — Progress Notes (Signed)
Patient denies any concerns today.  

## 2021-03-02 DIAGNOSIS — Z20822 Contact with and (suspected) exposure to covid-19: Secondary | ICD-10-CM | POA: Diagnosis not present

## 2021-03-04 DIAGNOSIS — I10 Essential (primary) hypertension: Secondary | ICD-10-CM | POA: Diagnosis not present

## 2021-03-04 DIAGNOSIS — R0600 Dyspnea, unspecified: Secondary | ICD-10-CM | POA: Diagnosis not present

## 2021-03-04 DIAGNOSIS — M5481 Occipital neuralgia: Secondary | ICD-10-CM | POA: Diagnosis not present

## 2021-03-04 DIAGNOSIS — R0602 Shortness of breath: Secondary | ICD-10-CM | POA: Diagnosis not present

## 2021-03-04 DIAGNOSIS — R001 Bradycardia, unspecified: Secondary | ICD-10-CM | POA: Diagnosis not present

## 2021-03-04 DIAGNOSIS — E119 Type 2 diabetes mellitus without complications: Secondary | ICD-10-CM | POA: Diagnosis not present

## 2021-03-04 DIAGNOSIS — I25118 Atherosclerotic heart disease of native coronary artery with other forms of angina pectoris: Secondary | ICD-10-CM | POA: Diagnosis not present

## 2021-03-04 DIAGNOSIS — Z955 Presence of coronary angioplasty implant and graft: Secondary | ICD-10-CM | POA: Diagnosis not present

## 2021-03-04 DIAGNOSIS — E7800 Pure hypercholesterolemia, unspecified: Secondary | ICD-10-CM | POA: Diagnosis not present

## 2021-03-10 DIAGNOSIS — R0602 Shortness of breath: Secondary | ICD-10-CM | POA: Diagnosis not present

## 2021-03-13 ENCOUNTER — Ambulatory Visit (INDEPENDENT_AMBULATORY_CARE_PROVIDER_SITE_OTHER): Payer: Medicare Other | Admitting: Podiatry

## 2021-03-13 ENCOUNTER — Encounter: Payer: Self-pay | Admitting: Podiatry

## 2021-03-13 ENCOUNTER — Other Ambulatory Visit: Payer: Self-pay

## 2021-03-13 DIAGNOSIS — B351 Tinea unguium: Secondary | ICD-10-CM

## 2021-03-13 DIAGNOSIS — M79676 Pain in unspecified toe(s): Secondary | ICD-10-CM | POA: Diagnosis not present

## 2021-03-13 DIAGNOSIS — M2010 Hallux valgus (acquired), unspecified foot: Secondary | ICD-10-CM | POA: Diagnosis not present

## 2021-03-13 DIAGNOSIS — E119 Type 2 diabetes mellitus without complications: Secondary | ICD-10-CM | POA: Diagnosis not present

## 2021-03-13 NOTE — Progress Notes (Signed)
This patient returns to my office for at risk foot care.  This patient requires this care by a professional since this patient will be at risk due to having type 2 diabetes.   This patient is unable to cut nails himself since the patient cannot reach his nails.These nails are painful walking and wearing shoes.  This patient presents for at risk foot care today.  General Appearance  Alert, conversant and in no acute stress.  Vascular  Dorsalis pedis and posterior tibial  pulses are weakly  palpable  bilaterally.  Capillary return is within normal limits  bilaterally. Cold feet.  Bilaterally. Absent digital hair.  Neurologic  Senn-Weinstein monofilament wire test within normal limits  bilaterally. Muscle power within normal limits bilaterally.  Nails Thick disfigured discolored nails with subungual debris  from hallux to fifth toes bilaterally. No evidence of bacterial infection or drainage bilaterally.  Orthopedic  No limitations of motion  feet .  No crepitus or effusions noted.  No bony pathology or digital deformities noted.  HAV  B/L.  Bony prominence fifth metabase.    Skin  normotropic skin with no porokeratosis noted bilaterally.  No signs of infections or ulcers noted.     Onychomycosis  Pain in right toes  Pain in left toes  Consent was obtained for treatment procedures.   Mechanical debridement of nails 1-5  bilaterally performed with a nail nipper.  Filed with dremel without incident.    Return office visit   4 months                   Told patient to return for periodic foot care and evaluation due to potential at risk complications.   Gardiner Barefoot DPM

## 2021-03-18 ENCOUNTER — Telehealth: Payer: Self-pay | Admitting: *Deleted

## 2021-03-18 NOTE — Telephone Encounter (Signed)
"  I was just at your office a few minutes ago.  I was looking at my appointment and it says August the 18.  I didn't realize that.  I come to see Dr. Prudence Davidson every four months.  Call me."

## 2021-03-19 DIAGNOSIS — R001 Bradycardia, unspecified: Secondary | ICD-10-CM | POA: Diagnosis not present

## 2021-03-19 DIAGNOSIS — E119 Type 2 diabetes mellitus without complications: Secondary | ICD-10-CM | POA: Diagnosis not present

## 2021-03-19 DIAGNOSIS — I10 Essential (primary) hypertension: Secondary | ICD-10-CM | POA: Diagnosis not present

## 2021-03-19 DIAGNOSIS — M5481 Occipital neuralgia: Secondary | ICD-10-CM | POA: Diagnosis not present

## 2021-03-19 DIAGNOSIS — E7800 Pure hypercholesterolemia, unspecified: Secondary | ICD-10-CM | POA: Diagnosis not present

## 2021-03-19 DIAGNOSIS — I2510 Atherosclerotic heart disease of native coronary artery without angina pectoris: Secondary | ICD-10-CM | POA: Diagnosis not present

## 2021-03-19 DIAGNOSIS — R0600 Dyspnea, unspecified: Secondary | ICD-10-CM | POA: Diagnosis not present

## 2021-03-19 DIAGNOSIS — Z955 Presence of coronary angioplasty implant and graft: Secondary | ICD-10-CM | POA: Diagnosis not present

## 2021-03-19 NOTE — Progress Notes (Signed)
Name: Austin Hunt   MRN: [id number]    DOB: 1941-02-21   Date:03/20/2021       Progress Note  Subjective  Chief Complaint  Follow up   HPI   DMII: glucose at homehas been well controlled in the low 100's .Last A1C today is 7.1 % He denies polyphagia, polydipsia or polyuria. Vision is stable he has glaucoma ( uses eye drops) and cataract surgery.He also has ED and sees Urologist , he states Cialis does not work , he is not ready for pump or injections.He is only on Metformin , he follows a diabetic diet. He is on Zetia since intolerant to statin therapy. Discussed trying to stop Metformin and try switching to Actos   Leukopenia: chronic : currently under the care of Dr. Tasia Catchings and reassuring labs and was released from her care in April 2022   BPH and prostate cancer: he is seeing Urologist and denies any obstructive symptoms, except for nocturiaabout 1-  2 times per night. Incidental finding of prostate cancer during TURP back in 2015 , last PSA was done June 2021 and it was 2.12 , he is now on yearly follow up with Dr. Caryl Pina and scheduled for June 25th 2022   History of HTN : patient is off medication, no chest pain or palpitation, he states bp at home has been around120's/80's. BP back to normal since weight loss.   Dyslipidemia: he is not on statin therapy, myalgia in the past, heis aking Zetia now and last LDL was slightly better but still not at goal. He does not want injectable medication. He does not want to start rx Vascepa   Chronic constipation: He has increased water intake , he drinks prune juice prn. He states he has been having bowel movements about 3  times a week, but last bowel movement was this past week. He is out of Amitiza and has only been taking some otc tea, discussed miralax. He denies pain or bloating.   SOB with activity:He had a stress test done by Dr. Clayborn Bigness for evaluation of chest pain back in 11/2018 and results were normal . He recently went  back and had an EKG  May 9 th 2022  NORMAL LEFT VENTRICULAR SYSTOLIC FUNCTION  NORMAL RIGHT VENTRICULAR SYSTOLIC FUNCTION  MILD VALVULAR REGURGITATION (See above)  NO VALVULAR STENOSIS  ESTIMATED LVEF >55%    11/2018:  Normal myocardial perfusion scan no evidence of stress-induced  myocardial ischemia ejection fraction of 64% conclusion negative scan.  Low risk scan  Recurrent rash on groin: seen by Dr. Phillip Heal and has Ketoconazole, no longer using hydrocortisone . Using it prn when he has an outbreak   Patient Active Problem List   Diagnosis Date Noted  . Statin intolerance 06/21/2020  . 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 ([place]) 06/21/2020  . Prostate cancer ([place]) 02/07/2018  . Cervical pain (neck) 10/13/2016  . Uncontrolled type 2 diabetes mellitus with glaucoma ([place]) 07/30/2016  . Long term current use of systemic steroids 01/07/2016  . Neutropenia ([place]) 01/07/2016  . Anxiety 04/08/2015  . 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 Chronic constipation 04/08/2015  . 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 Failure of erection 04/08/2015  . H/O inguinal hernia repair 04/08/2015  . HLD (hyperlipidemia) 04/08/2015  . 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 LBP (low back pain) 04/08/2015  . 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 Drug intolerance 04/08/2015  . Kidney  lump 04/08/2015  . TI (tricuspid incompetence) 04/08/2015  . Unilateral recurrent femoral hernia without obstruction or gangrene 05/23/2014  . Femoral hernia 05/23/2014  . Right inguinal hernia 02/06/2014  . Cataract 01/16/2014  . Glaucoma 01/16/2014  . Reflux esophagitis 06/21/2009  . Coronary atherosclerosis 04/03/2009  . Benign enlargement of prostate 02/11/2009  . Decreased libido 01/12/2008  . Benign essential HTN 09/12/2007    Past Surgical History:  Procedure Laterality Date  . APPENDECTOMY  1954  . BLADDER  SURGERY  Oct 2015  . CARDIAC CATHETERIZATION  2002   1 stent  . COLONOSCOPY    . EYE SURGERY  2011, 2013   cataracts  . EYE SURGERY Right May 2016   glaucoma  . HERNIA REPAIR Left 1967  . HERNIA REPAIR Right 05-10-14   Right femoral hernia repair Dr Bary Castilla with PerFix plug, no onlay mesh..   . PHOTOCOAGULATION WITH LASER Right 12/30/2015   Procedure: PHOTOCOAGULATION WITH LASER;  Surgeon: Ronnell Freshwater, MD;  Location: [facility];  Service: Ophthalmology;  Laterality: Right;  DIABETIC - oral meds IVA BLOCK  . PROSTATE SURGERY  Oct 2015    Family History  Problem Relation Age of Onset  . Diabetes Sister   . Diabetes Brother   . Diabetes Sister   . Prostate cancer Neg Hx   . Bladder Cancer Neg Hx     Social History   Tobacco Use  . Smoking status: Never Smoker  . Smokeless tobacco: Never Used  . Tobacco comment: smoking cessation materials not required  Substance Use Topics  . Alcohol use: No     Current Outpatient Medications:  .  Accu-Chek Softclix Lancets lancets, TEST 4 TIMES DAILY, Disp: 100 each, Rfl: 5 .  aspirin 81 MG tablet, Take 81 mg by mouth daily. , Disp: , Rfl:  .  Blood Glucose Monitoring Suppl (ACCU-CHEK GUIDE ME) w/Device KIT, , Disp: , Rfl:  .  brimonidine-timolol (COMBIGAN) 0.2-0.5 % ophthalmic solution, Place 1 drop into both eyes every 12 (twelve) hours., Disp: , Rfl:  .  Cholecalciferol (VITAMIN D) 50 MCG (2000 UT) CAPS, Take 1 capsule (2,000 Units total) by mouth daily., Disp: 30 capsule, Rfl: 0 .  Cyanocobalamin (B-12) 1000 MCG SUBL, Place 1 tablet under the tongue daily., Disp: 30 tablet, Rfl: 0 .  dorzolamide (TRUSOPT) 2 % ophthalmic solution, 1 drop 2 (two) times daily. , Disp: , Rfl:  .  ezetimibe (ZETIA) 10 MG tablet, TAKE 1 TABLET BY MOUTH ONCE DAILY, Disp: 90 tablet, Rfl: 3 .  gabapentin (NEURONTIN) 100 MG capsule, Take 1-3 capsules (100-300 mg total) by mouth at bedtime. For itching on foot (Patient taking differently: Take  100-300 mg by mouth at bedtime. For itching on foot Open capsules and empty into applesauce to avoid gelatin), Disp: 90 capsule, Rfl: 0 .  Garlic 10 MG CAPS, Take 15 mg by mouth daily. , Disp: , Rfl:  .  gentamicin ointment (GARAMYCIN) 0.1 %, Apply topically., Disp: , Rfl:  .  glucose blood (ACCU-CHEK ACTIVE STRIPS) test strip, Use as instructed, Disp: 100 each, Rfl: 12 .  ketoconazole (NIZORAL) 2 % cream, SMARTSIG:1 Application Topical 1 to 2 Times Daily, Disp: , Rfl:  .  latanoprost (XALATAN) 0.005 % ophthalmic solution, Place 1 drop into both eyes at bedtime. , Disp: , Rfl:  .  Magnesium 100 MG TABS, Take by mouth. , Disp: , Rfl:  .  metFORMIN (GLUCOPHAGE-XR) 500 MG 24 hr tablet, TAKE 2 TABLETS BY MOUTH ONCE EVERY  MORNING AND 1 TABLET AT BEDTIME, Disp: 270 tablet, Rfl: 1 .  Multiple Vitamin (MULTIVITAMIN) tablet, Take 1 tablet by mouth daily., Disp: , Rfl:  .  OMEGA-3 FATTY ACIDS PO, Take 250 mg by mouth daily. , Disp: , Rfl:  .  tadalafil (CIALIS) 20 MG tablet, TAKE 0.5-1 TABLET BY MOUTH EVERY OTHER DAY AS NEEDED FOR ERECTILE DYSFUNCTION, Disp: 30 tablet, Rfl: 0 .  triamcinolone (KENALOG) 0.025 % cream, Apply topically., Disp: , Rfl:  .  vitamin C (ASCORBIC ACID) 500 MG tablet, Take 500 mg by mouth 2 (two) times daily. Chewable, Disp: , Rfl:  .  Zinc Acetate 50 MG CAPS, Take 1 capsule by mouth daily. , Disp: , Rfl:   Allergies  Allergen Reactions  . Colesevelam Other (See Comments)  . Penicillins Itching and Other (See Comments)  . Statins Other (See Comments)    myalgia  . Welchol [Colesevelam Hcl]     I personally reviewed active problem list, medication list, allergies, family history, social history, health maintenance with the patient/caregiver today.   ROS  Constitutional: Negative for fever or weight change.  Respiratory: Negative for cough and shortness of breath.   Cardiovascular: Negative for chest pain or palpitations.  Gastrointestinal: Negative for abdominal pain, no  bowel changes.  Musculoskeletal: Negative for gait problem or joint swelling.  Skin: Negative for rash.  Neurological: Negative for dizziness or headache.  No other specific complaints in a complete review of systems (except as listed in HPI above).  Objective  Vitals:   03/20/21 0941  BP: 120/68  Pulse: 72  Resp: 16  Temp: 98 F (36.7 C)  TempSrc: Oral  SpO2: 99%  Weight: 129 lb (58.5 kg)  Height: _0  (1.753 m)    Body mass index is 19.05 kg/m.  Physical Exam  Constitutional: Patient appears well-developed and malnourished No distress.  HEENT: head atraumatic, normocephalic, pupils equal and reactive to light, neck supple Cardiovascular: Normal rate, regular rhythm and normal heart sounds.  No murmur heard. No BLE edema. Pulmonary/Chest: Effort normal and breath sounds normal. No respiratory distress. Abdominal: Soft.  There is no tenderness. Psychiatric: Patient has a normal mood and affect. behavior is normal. Judgment and thought content normal.  Recent Results (from the past 2160 hour(s))  Flow cytometry panel-leukemia/lymphoma work-up     Status: None   Collection Time: 02/24/21 12:01 PM  Result Value Ref Range   PATH INTERP XXX-IMP Comment     Comment: No significant immunophenotypic abnormality detected, see comment   ANNOTATION COMMENT IMP Comment     Comment: (NOTE) Previous phenotyping report from 11/12/20 was reviewed. It showed a minute population of plasmablasts.    CLINICAL INFO Comment     Comment: (NOTE) Accompanying CBC dated 02-24-21 shows: WBC count 3.4, Neu 1.8, Lym 1.2, Mon 0.3.    Specimen Type Comment     Comment: Peripheral blood   ASSESSMENT OF LEUKOCYTES Comment     Comment: (NOTE) No monoclonal B cell population is detected. kappa:lambda ratio 1.5 There is no loss of, or aberrant expression of, the pan T cell antigens to suggest a neoplastic T cell process. CD4:CD8 ratio 5.1 No circulating blasts are detected. There is no  immunophenotypic evidence of abnormal myeloid maturation. Analysis of the leukocyte population shows: granulocytes 60% (including 5% eosinophils), monocytes 5%, lymphocytes 35%, blasts <0.1%, B cells 3%, T cells 27%, NK cells 5%.    % Viable Cells Comment     Comment: 95%   ANALYSIS AND GATING  STRATEGY Comment     Comment: 8 color analysis with CD45/SSC   IMMUNOPHENOTYPING STUDY Comment     Comment: (NOTE) CD2       Normal         CD3       Normal CD4       Normal         CD5       Normal CD7       Normal         CD8       Normal CD10      Normal         CD11b     Normal CD13      Normal         CD14      Normal CD16      Normal         CD19      Normal CD20      Normal         CD33      Normal CD34      Normal         CD38      Normal CD45      Normal         CD56      Normal CD57      Normal         CD117     Normal HLA-DR    Normal         KAPPA     Normal LAMBDA    Normal         CD64      Normal    PATHOLOGIST NAME Comment     Comment: Lovett Sox, M.D.   COMMENT: Comment     Comment: (NOTE) Each antibody in this assay was utilized to assess for potential abnormalities of studied cell populations or to characterize identified abnormalities. This test was developed and its performance characteristics determined by [facility].  It has not been cleared or approved by the U.S. Food and Drug Administration. The FDA has determined that such clearance or approval is not necessary. This test is used for clinical purposes.  It should not be regarded as investigational or for research. Performed At: -[facility] [address] [place], [place] [id number] Katina Degree MDPhD [id number] Performed At: [facility] [address] [place], [place] [id number] Katina Degree MDPhD [id number]   Lactate dehydrogenase     Status: None   Collection Time: 02/24/21 12:01 PM  Result Value Ref Range   LDH 137 98 - 192 U/L    Comment: Performed at [facility], [address] [place], [place] [address]  Comprehensive metabolic panel     Status: Abnormal   Collection Time: 02/24/21 12:01 PM  Result Value Ref Range   Sodium 137 135 - 145 mmol/L   Potassium 4.0 3.5 - 5.1 mmol/L   Chloride 98 98 - 111 mmol/L   CO2 28 22 - 32 mmol/L   Glucose, Bld 272 (H) 70 - 99 mg/dL    Comment: Glucose reference range applies only to samples taken after fasting for at least 8 hours.   BUN 21 8 - 23 mg/dL   Creatinine, Ser 0.84 0.61 - 1.24 mg/dL   Calcium 9.5 8.9 - 10.3 mg/dL   Total Protein 7.3 6.5 - 8.1 g/dL   Albumin 4.1 3.5 - 5.0 g/dL   AST 25 15 - 41  U/L   ALT 16 0 - 44 U/L   Alkaline Phosphatase 58 38 - 126 U/L   Total Bilirubin 0.7 0.3 - 1.2 mg/dL   GFR, Estimated >60 >60 mL/min    Comment: (NOTE) Calculated using the CKD-EPI Creatinine Equation (2021)    Anion gap 11 5 - 15    Comment: Performed at [facility], [address] [place], [place] [address]  CBC with Differential/Platelet     Status: Abnormal   Collection Time: 02/24/21 12:01 PM  Result Value Ref Range   WBC 3.4 (L) 4.0 - 10.5 K/uL   RBC 4.97 4.22 - 5.81 MIL/uL   Hemoglobin 14.5 13.0 - 17.0 g/dL   HCT 44.9 39.0 - 52.0 %   MCV 90.3 80.0 - 100.0 fL   MCH 29.2 26.0 - 34.0 pg   MCHC 32.3 30.0 - 36.0 g/dL   RDW 12.5 11.5 - 15.5 %   Platelets 201 150 - 400 K/uL   nRBC 0.0 0.0 - 0.2 %   Neutrophils Relative % 54 %   Neutro Abs 1.8 1.7 - 7.7 K/uL   Lymphocytes Relative 34 %   Lymphs Abs 1.2 0.7 - 4.0 K/uL   Monocytes Relative 8 %   Monocytes Absolute 0.3 0.1 - 1.0 K/uL   Eosinophils Relative 4 %   Eosinophils Absolute 0.1 0.0 - 0.5 K/uL   Basophils Relative 0 %   Basophils Absolute 0.0 0.0 - 0.1 K/uL   Immature Granulocytes 0 %   Abs Immature Granulocytes 0.00 0.00 - 0.07 K/uL    Comment: Performed at [facility], [place], [place] [address]  POCT HgB A1C     Status: Abnormal   Collection Time: 03/20/21  9:46 AM  Result Value Ref Range   Hemoglobin A1C 7.1  (A) 4.0 - 5.6 %   HbA1c POC (<> result, manual entry)     HbA1c, POC (prediabetic range)     HbA1c, POC (controlled diabetic range)        PHQ2/9: Depression screen [facility] 2/9 03/20/2021 11/05/2020 09/18/2020 06/21/2020 05/30/2020  Decreased Interest 0 0 0 0 0  Down, Depressed, Hopeless 0 0 0 0 0  PHQ - 2 Score 0 0 0 0 0  Altered sleeping - - - 0 -  Tired, decreased energy - - - 0 -  Change in appetite - - - 0 -  Feeling bad or failure about yourself  - - - 0 -  Trouble concentrating - - - 0 -  Moving slowly or fidgety/restless - - - 0 -  Suicidal thoughts - - - 0 -  PHQ-9 Score - - - 0 -  Difficult doing work/chores - - - - -  Some recent data might be hidden    phq 9 is negative   Fall Risk: Fall Risk  03/20/2021 11/05/2020 09/18/2020 06/21/2020 05/30/2020  Falls in the past year? 0 0 0 0 0  Number falls in past yr: 0 0 0 - 0  Injury with Fall? 0 0 0 - 0  Risk for fall due to : - - - - No Fall Risks  Risk for fall due to: Comment - - - - -  Follow up - Falls evaluation completed - - Falls prevention discussed     Functional Status Survey: Is the patient deaf or have difficulty hearing?: No Does the patient have difficulty seeing, even when wearing glasses/contacts?: No Does the patient have difficulty concentrating, remembering, or making decisions?: No  Does the patient have difficulty walking or climbing stairs?: No Does the patient have difficulty dressing or bathing?: No Does the patient have difficulty doing errands alone such as visiting a doctor's office or shopping?: No    Assessment & Plan  1. 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  Dispense: 90 tablet; Refill: 1  2. B12 deficiency   3. Dyslipidemia   4. Hyperlipidemia associated with type 2 diabetes mellitus ([place])   5. 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  Dispense: 90 tablet; Refill: 1 - metFORMIN (GLUCOPHAGE-XR) 500 MG 24 hr tablet; Take 1 tablet (500 mg total) by mouth daily with breakfast.  Dispense: 90 tablet; Refill: 0  6. Vitamin D deficiency   7. Myalgia due to statin   8. Prostate cancer [facility])  Keep follow up with Urologist   9. 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  Dispense: 90 tablet; Refill: 0  10. History of HTN   11. Mild protein-calorie malnutrition ([place])  We will decrease dose of metformin and add Actos and try adding protein shakes

## 2021-03-20 ENCOUNTER — Other Ambulatory Visit: Payer: Self-pay

## 2021-03-20 ENCOUNTER — Ambulatory Visit (INDEPENDENT_AMBULATORY_CARE_PROVIDER_SITE_OTHER): Payer: Medicare Other | Admitting: Family Medicine

## 2021-03-20 ENCOUNTER — Encounter: Payer: Self-pay | Admitting: Family Medicine

## 2021-03-20 VITALS — BP 120/68 | HR 72 | Temp 98.0°F | Resp 16 | Ht 69.0 in | Wt 129.0 lb

## 2021-03-20 DIAGNOSIS — E559 Vitamin D deficiency, unspecified: Secondary | ICD-10-CM | POA: Diagnosis not present

## 2021-03-20 DIAGNOSIS — E1140 Type 2 diabetes mellitus with diabetic neuropathy, unspecified: Secondary | ICD-10-CM

## 2021-03-20 DIAGNOSIS — E1139 Type 2 diabetes mellitus with other diabetic ophthalmic complication: Secondary | ICD-10-CM

## 2021-03-20 DIAGNOSIS — E538 Deficiency of other specified B group vitamins: Secondary | ICD-10-CM

## 2021-03-20 DIAGNOSIS — E1169 Type 2 diabetes mellitus with other specified complication: Secondary | ICD-10-CM | POA: Diagnosis not present

## 2021-03-20 DIAGNOSIS — H42 Glaucoma in diseases classified elsewhere: Secondary | ICD-10-CM

## 2021-03-20 DIAGNOSIS — E441 Mild protein-calorie malnutrition: Secondary | ICD-10-CM | POA: Diagnosis not present

## 2021-03-20 DIAGNOSIS — C61 Malignant neoplasm of prostate: Secondary | ICD-10-CM | POA: Diagnosis not present

## 2021-03-20 DIAGNOSIS — N521 Erectile dysfunction due to diseases classified elsewhere: Secondary | ICD-10-CM

## 2021-03-20 DIAGNOSIS — Z8679 Personal history of other diseases of the circulatory system: Secondary | ICD-10-CM | POA: Diagnosis not present

## 2021-03-20 DIAGNOSIS — M791 Myalgia, unspecified site: Secondary | ICD-10-CM | POA: Diagnosis not present

## 2021-03-20 DIAGNOSIS — E785 Hyperlipidemia, unspecified: Secondary | ICD-10-CM

## 2021-03-20 DIAGNOSIS — E1136 Type 2 diabetes mellitus with diabetic cataract: Secondary | ICD-10-CM

## 2021-03-20 DIAGNOSIS — T466X5A Adverse effect of antihyperlipidemic and antiarteriosclerotic drugs, initial encounter: Secondary | ICD-10-CM

## 2021-03-20 LAB — POCT GLYCOSYLATED HEMOGLOBIN (HGB A1C): Hemoglobin A1C: 7.1 % — AB (ref 4.0–5.6)

## 2021-03-20 MED ORDER — METFORMIN HCL ER 500 MG PO TB24: 500.0000 mg | ORAL_TABLET | Freq: Every day | ORAL | 0 refills | Status: DC

## 2021-03-20 MED ORDER — PIOGLITAZONE HCL 15 MG PO TABS
15.0000 mg | ORAL_TABLET | Freq: Every day | ORAL | 1 refills | Status: DC
Start: 2021-03-20 — End: 2021-07-21

## 2021-03-21 NOTE — Telephone Encounter (Signed)
Austin Hunt rescheduled his appointment to 07/17/2021.

## 2021-04-07 ENCOUNTER — Other Ambulatory Visit: Payer: Self-pay | Admitting: Family Medicine

## 2021-04-07 NOTE — Telephone Encounter (Signed)
Future visit in 3 months  

## 2021-04-08 DIAGNOSIS — H401133 Primary open-angle glaucoma, bilateral, severe stage: Secondary | ICD-10-CM | POA: Diagnosis not present

## 2021-04-10 ENCOUNTER — Telehealth: Payer: Self-pay

## 2021-04-10 NOTE — Telephone Encounter (Signed)
Copied from [place] [id number]. Topic: General - Other >> Apr 10, 2021  9:33 AM Yvette Rack wrote: Reason for CRM: Pt stated his Rx was changed from metformin to pioglitazone (ACTOS) 15 MG tablet but it does not lower his sugar level. Pt stated when he checks his sugar level it seems that it increases instead of the medication lowering the sugar level. Pt stated he needs to speak with [name] about this. Pt requests call back. Cb# [phone]

## 2021-04-11 ENCOUNTER — Ambulatory Visit: Payer: Self-pay | Admitting: *Deleted

## 2021-04-11 NOTE — Telephone Encounter (Signed)
Reason for Disposition  [1] Caller has URGENT medicine question about med that PCP or specialist prescribed AND [2] triager unable to answer question  Answer Assessment - Initial Assessment Questions 1. NAME of MEDICATION: "What medicine are you calling about?"     Metformin and Actos 2. QUESTION: "What is your question?" (e.g., double dose of medicine, side effect)     He called yesterday questioning why his glucose was elevated after starting the Actos. He needed clarification.   He stopped the metformin and is taking the Actos only. 3. PRESCRIBING HCP: "Who prescribed it?" Reason: if prescribed by specialist, call should be referred to that group.     Dr. [name] 4. SYMPTOMS: "Do you have any symptoms?"     Elevated glucose without the metformin and taking the Actos only.   He is requesting advice from [name] 5. SEVERITY: If symptoms are present, ask "Are they mild, moderate or severe?"     Not asked 6. PREGNANCY:  "Is there any chance that you are pregnant?" "When was your last menstrual period?"     N/A  Protocols used: Medication Question Call-A-AH

## 2021-04-11 NOTE — Telephone Encounter (Signed)
Pt.notified

## 2021-04-11 NOTE — Telephone Encounter (Signed)
The agent asked me to look at the message from [name] that he should not have stopped the metformin but should be taking both the metformin and the new medication Actos.   The agent tried to call into [place] but there was no answer.   He did not have the pt on the line when he contacted me.   I let the agent know I would call the pt back and give him the message from [name] which I did.  Pt was returning the call from [facility] from where he had called them yesterday concerned about his glucose being elevated after stopping the metformin and starting the Actos 15 mg tablets.  I read the message from [name] to him where he was not supposed to stop the metformin.   The Actos was added to take daily.   It takes 6-8 weeks for it to take effect.  He responded,  "That's why my sugar is higher".   "I stopped the metformin thinking the Actos was replacing it".   "So I'm supposed to take both".    I let him know yes he was supposed to take both medications so that's probably why his glucose is elevated.   He verbalized understanding.  He mentioned he had been reading about the Actos on his computer and that it can cause bladder cancer after being on it for about a year.   He is uncomfortable with taking the Actos after reading this.   He would like [name]' feed back on that information.   "Is there something else she can prescribe?"  I let him know I would send her a note with his concern regarding the bladder cancer and have someone call him back.   He was agreeable to this plan.   He can be reached at [phone].    I sent my notes to [facility] high priority for Dr Ancil Boozer.

## 2021-04-25 ENCOUNTER — Ambulatory Visit: Payer: Medicare Other | Admitting: Urology

## 2021-05-06 ENCOUNTER — Other Ambulatory Visit: Payer: Self-pay

## 2021-05-06 ENCOUNTER — Other Ambulatory Visit
Admission: RE | Admit: 2021-05-06 | Discharge: 2021-05-06 | Disposition: A | Discharge status: Home / Self Care | Payer: Medicare Other | Attending: Urology | Admitting: Urology

## 2021-05-06 DIAGNOSIS — C61 Malignant neoplasm of prostate: Secondary | ICD-10-CM

## 2021-05-06 LAB — PSA: Prostatic Specific Antigen: 1.29 ng/mL (ref 0.00–4.00)

## 2021-05-09 ENCOUNTER — Ambulatory Visit (INDEPENDENT_AMBULATORY_CARE_PROVIDER_SITE_OTHER): Payer: Medicare Other | Admitting: Urology

## 2021-05-09 ENCOUNTER — Other Ambulatory Visit: Payer: Self-pay

## 2021-05-09 VITALS — BP 134/62 | HR 105 | Ht 67.0 in | Wt 124.0 lb

## 2021-05-09 DIAGNOSIS — C61 Malignant neoplasm of prostate: Secondary | ICD-10-CM

## 2021-05-09 DIAGNOSIS — N5203 Combined arterial insufficiency and corporo-venous occlusive erectile dysfunction: Secondary | ICD-10-CM | POA: Diagnosis not present

## 2021-05-09 MED ORDER — SILDENAFIL CITRATE 20 MG PO TABS: ORAL_TABLET | ORAL | 6 refills | Status: DC

## 2021-05-09 NOTE — Progress Notes (Signed)
05/09/2021 10:33 AM   Austin Hunt Feb 12, 1941 [id number]  Referring provider: [name], MD [address] [place],  [place] [address]  Chief Complaint  Patient presents with   Prostate Cancer    HPI: 80 year old male who returns today for routine follow-up.  Today, he reports that he is doing very well.  He has not had many changes in his medical history of this year.  He seems to be voiding well.  He gets up once or twice at night to urinate which is stable and nonbothersome to him.  He is on no BPH medications.  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 No longer on dutasteride. PSA 0.7 on dutasteride.    Today, he reports that he continues to struggle with erectile dysfunction.  He has tried Cialis 20 mg and this not helpful.  He was on sildenafil in the past.  He like to try this again.  If this does not work, he may be open to injections.  PSA 1.29.  PVR today is 0    IPSS     Row Name 05/09/21 1000         International Prostate Symptom Score   How often have you had the sensation of not emptying your bladder? Less than 1 in 5     How often have you had to urinate less than every two hours? Less than 1 in 5 times     How often have you found you stopped and started again several times when you urinated? Less than 1 in 5 times     How often have you found it difficult to postpone urination? Not at All     How often have you had a weak urinary stream? Less than 1 in 5 times     How often have you had to strain to start urination? Not at All     How many times did you typically get up at night to urinate? 2 Times     Total IPSS Score 6           Quality of Life due to urinary symptoms     If you were to spend the rest of your life with your urinary condition just the way it is now how would you feel about that? 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    Diabetes mellitus without complication (HCC)    Glaucoma    Headache    rare - 1x/mo   Nocturia    Prostate cancer [facility])     Surgical History: Past Surgical History:  Procedure Laterality Date   APPENDECTOMY  1954   BLADDER SURGERY  Oct 2015   CARDIAC CATHETERIZATION  2002   1 stent   COLONOSCOPY     EYE SURGERY  2011, 2013   cataracts   EYE SURGERY Right May 2016   glaucoma   HERNIA REPAIR Left 1967   HERNIA REPAIR Right 05-10-14   Right femoral hernia repair Dr Bary Castilla with PerFix plug, no onlay mesh.Marland Kitchen    PHOTOCOAGULATION WITH LASER Right 12/30/2015   Procedure: PHOTOCOAGULATION WITH LASER;  Surgeon: Ronnell Freshwater, MD;  Location: [facility];  Service: Ophthalmology;  Laterality: Right;  DIABETIC - oral meds IVA BLOCK   PROSTATE SURGERY  Oct 2015    Home Medications:  Allergies as of 05/09/2021       Reactions   Colesevelam Other (See Comments)   Penicillins Itching, Other (See Comments)   Statins Other (See Comments)   myalgia   Welchol [colesevelam Hcl]         Medication List        Accurate as of May 09, 2021 10:33 AM. If you have any questions, ask your nurse or doctor.          Accu-Chek Guide Me w/Device Kit   Accu-Chek Guide test strip Generic drug: glucose blood USE AS DIRECTED. 4 TIMES DAILY   Accu-Chek Softclix Lancets lancets TEST 4 TIMES DAILY   aspirin 81 MG tablet Take 81 mg by mouth daily.   B-12 1000 MCG Subl Place 1 tablet under the tongue daily.   brimonidine-timolol 0.2-0.5 % ophthalmic solution Commonly known as: COMBIGAN Place 1 drop into both eyes every 12 (twelve) hours.   dorzolamide 2 % ophthalmic solution Commonly known as: TRUSOPT 1 drop 2 (two) times daily.   ezetimibe 10 MG tablet Commonly known  as: ZETIA TAKE 1 TABLET BY MOUTH ONCE DAILY   gabapentin 100 MG capsule Commonly known as: NEURONTIN Take 1-3 capsules (100-300 mg total) by mouth at bedtime. For itching on foot What changed: additional instructions   Garlic 10 MG Caps Take 15 mg by mouth daily.   gentamicin ointment 0.1 % Commonly known as: GARAMYCIN Apply topically.   ketoconazole 2 % cream Commonly known as: NIZORAL SMARTSIG:1 Application Topical 1 to 2 Times Daily   latanoprost 0.005 % ophthalmic solution Commonly known as: XALATAN Place 1 drop into both eyes at bedtime.   Magnesium 100 MG Tabs Take by mouth.   metFORMIN 500 MG 24 hr tablet Commonly known as: GLUCOPHAGE-XR Take 1 tablet (500 mg total) by mouth daily with breakfast.   multivitamin tablet Take 1 tablet by mouth daily.   OMEGA-3 FATTY ACIDS PO Take 250 mg by mouth daily.   pioglitazone 15 MG tablet Commonly known as: Actos Take 1 tablet (15 mg total) by mouth daily.   tadalafil 20 MG tablet Commonly known as: CIALIS TAKE 0.5-1 TABLET BY MOUTH EVERY OTHER DAY AS NEEDED FOR ERECTILE DYSFUNCTION   triamcinolone 0.025 % cream Commonly known as: KENALOG Apply topically.   vitamin C 500 MG tablet Commonly known as: ASCORBIC ACID Take 500 mg by mouth 2 (two) times daily. Chewable   Vitamin D 50 MCG (2000 UT) Caps Take 1 capsule (2,000 Units total) by mouth daily.   Zinc Acetate 50 MG Caps Take 1 capsule by mouth daily.        Allergies:  Allergies  Allergen Reactions   Colesevelam Other (See Comments)   Penicillins Itching and Other (See Comments)   Statins Other (See Comments)    myalgia   Welchol [Colesevelam Hcl]     Family History: Family History  Problem Relation Age of Onset   Diabetes Sister    Diabetes Brother    Diabetes Sister    Prostate cancer Neg Hx    Bladder Cancer Neg Hx     Social History:  reports that he has never smoked. He has never used smokeless tobacco. He reports that he does not  drink alcohol and does not use drugs.   Physical  Exam: BP 134/62   Pulse (!) 105   Ht _0  (1.702 m)   Wt 124 lb (56.2 kg)   BMI 19.42 kg/m   Constitutional:  Alert and oriented, No acute distress. HEENT: [place] AT, moist mucus membranes.  Trachea midline, no masses. Cardiovascular: No clubbing, cyanosis, or edema. Respiratory: Normal respiratory effort, no increased work of breathing. Skin: No rashes, bruises or suspicious lesions. Neurologic: Grossly intact, no focal deficits, moving all 4 extremities. Psychiatric: Normal mood and affect.  Laboratory Data: Lab Results  Component Value Date   WBC 3.4 (L) 02/24/2021   HGB 14.5 02/24/2021   HCT 44.9 02/24/2021   MCV 90.3 02/24/2021   PLT 201 02/24/2021    Lab Results  Component Value Date   CREATININE 0.84 02/24/2021    Lab Results  Component Value Date   HGBA1C 7.1 (A) 03/20/2021     Assessment & Plan:    1. 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  We discussed that vasoactive medication can be injected into the penis which help facilitate a timely erection.  We discussed our protocol for test dose teaching and he will let us know if he would like to pursue this.  F/u 1 year for PSA/ IPSS/ PVR  Hollice Espy, MD  [facility] [address] [place], [place] [address] [phone]

## 2021-05-14 ENCOUNTER — Telehealth: Payer: Self-pay

## 2021-05-14 NOTE — Progress Notes (Signed)
Chronic Care Management Pharmacy Assistant   Name: Austin Hunt  MRN: [id number] DOB: 1941/08/06  Reason for Encounter:Diabetes and Hypertension Disease State Call.   Recent office visits:  03/20/2021 [name] MD(PCP) start pioglitazone 15 MG tablet; Take 1 tablet  by mouth daily,decrease dose of metformin 500 mg daily with Breakfast  Recent consult visits:  05/09/2021  Dr.Brandon MD (Urology) restarted sildenafil 20 mg prn 23/95/3202 Delicia White NP (Cardiology) 03/13/2021 Gardiner Barefoot DPM (Podiatry) 03/10/2021 Lujean Amel MD (Cardiology) Echocardiogram Procedure 03/04/2021 Lujean Amel MD (Cardiology) 02/28/2021 Dr. Tasia Catchings MD (Oncology)  Hospital visits:  None in previous 6 months  Medications: Outpatient Encounter Medications as of 05/14/2021  Medication Sig   ACCU-CHEK GUIDE test strip USE AS DIRECTED. 4 TIMES DAILY   Accu-Chek Softclix Lancets lancets TEST 4 TIMES DAILY   aspirin 81 MG tablet Take 81 mg by mouth daily.    Blood Glucose Monitoring Suppl (ACCU-CHEK GUIDE ME) w/Device KIT    brimonidine-timolol (COMBIGAN) 0.2-0.5 % ophthalmic solution Place 1 drop into both eyes every 12 (twelve) hours.   Cholecalciferol (VITAMIN D) 50 MCG (2000 UT) CAPS Take 1 capsule (2,000 Units total) by mouth daily.   Cyanocobalamin (B-12) 1000 MCG SUBL Place 1 tablet under the tongue daily.   dorzolamide (TRUSOPT) 2 % ophthalmic solution 1 drop 2 (two) times daily.    ezetimibe (ZETIA) 10 MG tablet TAKE 1 TABLET BY MOUTH ONCE DAILY   gabapentin (NEURONTIN) 100 MG capsule Take 1-3 capsules (100-300 mg total) by mouth at bedtime. For itching on foot (Patient taking differently: Take 100-300 mg by mouth at bedtime. For itching on foot Open capsules and empty into applesauce to avoid gelatin)   Garlic 10 MG CAPS Take 15 mg by mouth daily.    gentamicin ointment (GARAMYCIN) 0.1 % Apply topically.   ketoconazole (NIZORAL) 2 % cream SMARTSIG:1 Application Topical 1 to 2 Times Daily    latanoprost (XALATAN) 0.005 % ophthalmic solution Place 1 drop into both eyes at bedtime.    Magnesium 100 MG TABS Take by mouth.    metFORMIN (GLUCOPHAGE-XR) 500 MG 24 hr tablet Take 1 tablet (500 mg total) by mouth daily with breakfast.   Multiple Vitamin (MULTIVITAMIN) tablet Take 1 tablet by mouth daily.   OMEGA-3 FATTY ACIDS PO Take 250 mg by mouth daily.    pioglitazone (ACTOS) 15 MG tablet Take 1 tablet (15 mg total) by mouth daily.   sildenafil (REVATIO) 20 MG tablet Take 1-5 tablets one hour prior to intercourse as needed.   tadalafil (CIALIS) 20 MG tablet TAKE 0.5-1 TABLET BY MOUTH EVERY OTHER DAY AS NEEDED FOR ERECTILE DYSFUNCTION   triamcinolone (KENALOG) 0.025 % cream Apply topically.   vitamin C (ASCORBIC ACID) 500 MG tablet Take 500 mg by mouth 2 (two) times daily. Chewable   Zinc Acetate 50 MG CAPS Take 1 capsule by mouth daily.    No facility-administered encounter medications on file as of 05/14/2021.    Care Gaps: COVID 19 vaccine,Shingrix Star Rating Drugs: Metformin 500 mg last filled 02/06/2021 90 day supply at [name] Pioglitazone 15 mg last filled 05/01/2021 30 day supply at [facility] Medication Fill Gaps: Gabapentin  100 MG capsule last filled 06/21/2020 for 30 day supply  Reviewed chart prior to disease state call. Spoke with patient regarding BP  Recent Office Vitals: BP Readings from Last 3 Encounters:  05/09/21 134/62  03/20/21 120/68  02/28/21 (!) 155/73   Pulse Readings from Last 3 Encounters:  05/09/21 Marland Kitchen)  105  03/20/21 72  02/28/21 65    Wt Readings from Last 3 Encounters:  05/09/21 124 lb (56.2 kg)  03/20/21 129 lb (58.5 kg)  02/28/21 132 lb (59.9 kg)     Kidney Function Lab Results  Component Value Date/Time   CREATININE 0.84 02/24/2021 12:01 PM   CREATININE 0.83 11/05/2020 09:57 AM   CREATININE 0.95 06/21/2020 09:10 AM   GFRNONAA >60 02/24/2021 12:01 PM   GFRNONAA 83 11/05/2020 09:57 AM   GFRAA 96 11/05/2020 09:57 AM     BMP Latest Ref Rng & Units 02/24/2021 11/05/2020 06/21/2020  Glucose 70 - 99 mg/dL 272(H) 130(H) 124(H)  BUN 8 - 23 mg/dL 21 16 21   Creatinine 0.61 - 1.24 mg/dL 0.84 0.83 0.95  BUN/Creat Ratio 6 - 22 (calc) - NOT APPLICABLE NOT APPLICABLE  Sodium 643 - 145 mmol/L 137 137 138  Potassium 3.5 - 5.1 mmol/L 4.0 4.3 4.5  Chloride 98 - 111 mmol/L 98 98 100  CO2 22 - 32 mmol/L 28 32 28  Calcium 8.9 - 10.3 mg/dL 9.5 9.7 9.5    Current antihypertensive regimen:  None How often are you checking your Blood Pressure? infrequently Current home BP readings:  Patient states his blood pressure ranges around 130's/60's. What recent interventions/DTPs have been made by any provider to improve Blood Pressure control since last CPP Visit: None ID Any recent hospitalizations or ED visits since last visit with CPP? No What diet changes have been made to improve Blood Pressure Control?  Patient trying to eat healthy, minimizing salt intake, watching sweets and fatty foods. What exercise is being done to improve your Blood Pressure Control?  exercise at gym 3-4 times weekly (treadmill, bike for 40-45 minutes)   Recent Relevant Labs: Lab Results  Component Value Date/Time   HGBA1C 7.1 (A) 03/20/2021 09:46 AM   HGBA1C 7.1 (H) 11/05/2020 09:57 AM   HGBA1C 7.3 (H) 12/15/2019 10:35 AM   HGBA1C 7.2 10/19/2018 02:11 PM   HGBA1C 7.2 (A) 10/19/2018 02:11 PM   HGBA1C 7.2 (A) 10/19/2018 02:11 PM   MICROALBUR 0.4 06/21/2020 09:10 AM   MICROALBUR 0.2 06/20/2019 09:00 AM   MICROALBUR 20 01/04/2017 10:45 AM    Kidney Function Lab Results  Component Value Date/Time   CREATININE 0.84 02/24/2021 12:01 PM   CREATININE 0.83 11/05/2020 09:57 AM   CREATININE 0.95 06/21/2020 09:10 AM   GFRNONAA >60 02/24/2021 12:01 PM   GFRNONAA 83 11/05/2020 09:57 AM   GFRAA 96 11/05/2020 09:57 AM    Current antihyperglycemic regimen:  Metformin XR 500 mg  daily with Breakfast  Pioglitazone 15 MG tablet; Take 1 tablet  by  mouth daily What recent interventions/DTPs have been made to improve glycemic control:  03/20/2021 [name] MD(PCP) start pioglitazone 15 MG tablet; Take 1 tablet  by mouth daily,decrease dose of metformin 500 mg daily with Breakfast. Patients states he is unsure if the medication change is working because his blood sugar was ranging around 120 and now it is ranging around 130.Informed patient per note on 04/10/2021 per PCP it takes 6-8 weeks for Actos to work.Patient states he will continue taking pioglitazone and Metformin as instructed and if he does see any change he will inform his PCP. Have there been any recent hospitalizations or ED visits since last visit with CPP? No Patient denies hypoglycemic symptoms, including Pale, Sweaty, Shaky, Hungry, Nervous/irritable, and Vision changes Patient denies hyperglycemic symptoms, including blurry vision, excessive thirst, fatigue, polyuria, and weakness How often are you checking your  blood sugar? once daily What are your blood sugars ranging?  Fasting:  Patient state his blood sugar is ranging around 130. Before meals: n/a After meals: n/a Bedtime: n/a During the week, how often does your blood glucose drop below 70? Never Are you checking your feet daily/regularly?   Patient denies numbness,pain or tingling sensations in the bottom of his feet.  Adherence Review: Is the patient currently on a STATIN medication? No Is the patient currently on ACE/ARB medication? No Does the patient have >5 day gap between last estimated fill dates? No  Patient is schedule for a telephone follow up with the clinical pharmacist on 01/07/2022.  [facility] Pharmacist Assistant [phone]

## 2021-06-03 ENCOUNTER — Ambulatory Visit (INDEPENDENT_AMBULATORY_CARE_PROVIDER_SITE_OTHER): Payer: Medicare Other

## 2021-06-03 DIAGNOSIS — Z0000 Encounter for general adult medical examination without abnormal findings: Secondary | ICD-10-CM

## 2021-06-03 NOTE — Patient Instructions (Signed)
Mr. Austin Hunt , Thank you for taking time to come for your Medicare Wellness Visit. I appreciate your ongoing commitment to your health goals. Please review the following plan we discussed and let me know if I can assist you in the future.   Screening recommendations/referrals: Colonoscopy: no longer required Recommended yearly ophthalmology/optometry visit for glaucoma screening and checkup Recommended yearly dental visit for hygiene and checkup  Vaccinations: Influenza vaccine: done 08/19/20 Pneumococcal vaccine: done 01/22/14 Tdap vaccine: done 11/05/20 Shingles vaccine: done 09/13/20; we will contact [facility] for second dose information   Covid-19: done 11/22/19, 12/16/19, 08/04/20 & 01/30/21  Advanced directives: Advance directive discussed with you today. I have provided a copy for you to complete at home and have notarized. Once this is complete please bring a copy in to our office so we can scan it into your chart.   Conditions/risks identified: Keep up the great work!  Next appointment: Follow up in one year for your annual wellness visit.   Preventive Care 70 Years and Older, Male Preventive care refers to lifestyle choices and visits with your health care provider that can promote health and wellness. What does preventive care include? A yearly physical exam. This is also called an annual well check. Dental exams once or twice a year. Routine eye exams. Ask your health care provider how often you should have your eyes checked. Personal lifestyle choices, including: Daily care of your teeth and gums. Regular physical activity. Eating a healthy diet. Avoiding tobacco and drug use. Limiting alcohol use. Practicing safe sex. Taking low doses of aspirin every day. Taking vitamin and mineral supplements as recommended by your health care provider. What happens during an annual well check? The services and screenings done by your health care provider during your annual well check  will depend on your age, overall health, lifestyle risk factors, and family history of disease. Counseling  Your health care provider may ask you questions about your: Alcohol use. Tobacco use. Drug use. Emotional well-being. Home and relationship well-being. Sexual activity. Eating habits. History of falls. Memory and ability to understand (cognition). Work and work Statistician. Screening  You may have the following tests or measurements: Height, weight, and BMI. Blood pressure. Lipid and cholesterol levels. These may be checked every 5 years, or more frequently if you are over 69 years old. Skin check. Lung cancer screening. You may have this screening every year starting at age 74 if you have a 30-pack-year history of smoking and currently smoke or have quit within the past 15 years. Fecal occult blood test (FOBT) of the stool. You may have this test every year starting at age 88. Flexible sigmoidoscopy or colonoscopy. You may have a sigmoidoscopy every 5 years or a colonoscopy every 10 years starting at age 56. Prostate cancer screening. Recommendations will vary depending on your family history and other risks. Hepatitis C blood test. Hepatitis B blood test. Sexually transmitted disease (STD) testing. Diabetes screening. This is done by checking your blood sugar (glucose) after you have not eaten for a while (fasting). You may have this done every 1-3 years. Abdominal aortic aneurysm (AAA) screening. You may need this if you are a current or former smoker. Osteoporosis. You may be screened starting at age 62 if you are at high risk. Talk with your health care provider about your test results, treatment options, and if necessary, the need for more tests. Vaccines  Your health care provider may recommend certain vaccines, such as: Influenza vaccine. This  is recommended every year. Tetanus, diphtheria, and acellular pertussis (Tdap, Td) vaccine. You may need a Td booster every 10  years. Zoster vaccine. You may need this after age 74. Pneumococcal 13-valent conjugate (PCV13) vaccine. One dose is recommended after age 77. Pneumococcal polysaccharide (PPSV23) vaccine. One dose is recommended after age 34. Talk to your health care provider about which screenings and vaccines you need and how often you need them. This information is not intended to replace advice given to you by your health care provider. Make sure you discuss any questions you have with your health care provider. Document Released: 11/15/2015 Document Revised: 07/08/2016 Document Reviewed: 08/20/2015 Elsevier Interactive Patient Education  2017 [place] Prevention in the Home Falls can cause injuries. They can happen to people of all ages. There are many things you can do to make your home safe and to help prevent falls. What can I do on the outside of my home? Regularly fix the edges of walkways and driveways and fix any cracks. Remove anything that might make you trip as you walk through a door, such as a raised step or threshold. Trim any bushes or trees on the path to your home. Use bright outdoor lighting. Clear any walking paths of anything that might make someone trip, such as rocks or tools. Regularly check to see if handrails are loose or broken. Make sure that both sides of any steps have handrails. Any raised decks and porches should have guardrails on the edges. Have any leaves, snow, or ice cleared regularly. Use sand or salt on walking paths during winter. Clean up any spills in your garage right away. This includes oil or grease spills. What can I do in the bathroom? Use night lights. Install grab bars by the toilet and in the tub and shower. Do not use towel bars as grab bars. Use non-skid mats or decals in the tub or shower. If you need to sit down in the shower, use a plastic, non-slip stool. Keep the floor dry. Clean up any water that spills on the floor as soon as it  happens. Remove soap buildup in the tub or shower regularly. Attach bath mats securely with double-sided non-slip rug tape. Do not have throw rugs and other things on the floor that can make you trip. What can I do in the bedroom? Use night lights. Make sure that you have a light by your bed that is easy to reach. Do not use any sheets or blankets that are too big for your bed. They should not hang down onto the floor. Have a firm chair that has side arms. You can use this for support while you get dressed. Do not have throw rugs and other things on the floor that can make you trip. What can I do in the kitchen? Clean up any spills right away. Avoid walking on wet floors. Keep items that you use a lot in easy-to-reach places. If you need to reach something above you, use a strong step stool that has a grab bar. Keep electrical cords out of the way. Do not use floor polish or wax that makes floors slippery. If you must use wax, use non-skid floor wax. Do not have throw rugs and other things on the floor that can make you trip. What can I do with my stairs? Do not leave any items on the stairs. Make sure that there are handrails on both sides of the stairs and use them. Fix handrails that  are broken or loose. Make sure that handrails are as long as the stairways. Check any carpeting to make sure that it is firmly attached to the stairs. Fix any carpet that is loose or worn. Avoid having throw rugs at the top or bottom of the stairs. If you do have throw rugs, attach them to the floor with carpet tape. Make sure that you have a light switch at the top of the stairs and the bottom of the stairs. If you do not have them, ask someone to add them for you. What else can I do to help prevent falls? Wear shoes that: Do not have high heels. Have rubber bottoms. Are comfortable and fit you well. Are closed at the toe. Do not wear sandals. If you use a stepladder: Make sure that it is fully opened.  Do not climb a closed stepladder. Make sure that both sides of the stepladder are locked into place. Ask someone to hold it for you, if possible. Clearly mark and make sure that you can see: Any grab bars or handrails. First and last steps. Where the edge of each step is. Use tools that help you move around (mobility aids) if they are needed. These include: Canes. Walkers. Scooters. Crutches. Turn on the lights when you go into a dark area. Replace any light bulbs as soon as they burn out. Set up your furniture so you have a clear path. Avoid moving your furniture around. If any of your floors are uneven, fix them. If there are any pets around you, be aware of where they are. Review your medicines with your doctor. Some medicines can make you feel dizzy. This can increase your chance of falling. Ask your doctor what other things that you can do to help prevent falls. This information is not intended to replace advice given to you by your health care provider. Make sure you discuss any questions you have with your health care provider. Document Released: 08/15/2009 Document Revised: 03/26/2016 Document Reviewed: 11/23/2014 Elsevier Interactive Patient Education  2017 [employer].

## 2021-06-03 NOTE — Progress Notes (Signed)
Subjective:   Austin Hunt is a 80 y.o. male who presents for Medicare Annual/Subsequent preventive examination.  Virtual Visit via Telephone Note  I connected with  Austin Hunt on 06/03/21 at  9:20 AM EDT by telephone and verified that I am speaking with the correct person using two identifiers.  Location: Patient: home Provider: [facility] Persons participating in the virtual visit: patient/Nurse Health Advisor   I discussed the limitations, risks, security and privacy concerns of performing an evaluation and management service by telephone and the availability of in person appointments. The patient expressed understanding and agreed to proceed.  Interactive audio and video telecommunications were attempted between this nurse and patient, however failed, due to patient having technical difficulties OR patient did not have access to video capability.  We continued and completed visit with audio only.  Some vital signs may be absent or patient reported.   Reather Littler, LPN   Review of Systems     Cardiac Risk Factors include: advanced age (>74men, >91 women);diabetes mellitus;male gender;dyslipidemia     Objective:    There were no vitals filed for this visit. There is no height or weight on file to calculate BMI.  Advanced Directives 06/03/2021 02/28/2021 11/26/2020 11/12/2020 05/30/2020 05/30/2019 05/24/2018  Does Patient Have a Medical Advance Directive? No Yes Yes Yes No No Yes;No  Type of Advance Directive - Living will Living will Living will - - Living will  Does patient want to make changes to medical advance directive? - - - - - - Yes (MAU/Ambulatory/Procedural Areas - Information given)  Copy of Healthcare Power of Attorney in Chart? - - - - - - -  Would patient like information on creating a medical advance directive? Yes (MAU/Ambulatory/Procedural Areas - Information given) - - - Yes (MAU/Ambulatory/Procedural Areas - Information given) Yes (MAU/Ambulatory/Procedural Areas -  Information given) -    Current Medications (verified) Outpatient Encounter Medications as of 06/03/2021  Medication Sig   ACCU-CHEK GUIDE test strip USE AS DIRECTED. 4 TIMES DAILY   Accu-Chek Softclix Lancets lancets TEST 4 TIMES DAILY   aspirin 81 MG tablet Take 81 mg by mouth daily.    Blood Glucose Monitoring Suppl (ACCU-CHEK GUIDE ME) w/Device KIT    brimonidine-timolol (COMBIGAN) 0.2-0.5 % ophthalmic solution Place 1 drop into both eyes every 12 (twelve) hours.   Cholecalciferol (VITAMIN D) 50 MCG (2000 UT) CAPS Take 1 capsule (2,000 Units total) by mouth daily.   Cyanocobalamin (B-12) 1000 MCG SUBL Place 1 tablet under the tongue daily.   docusate sodium (COLACE) 100 MG capsule Take 100 mg by mouth daily.   dorzolamide (TRUSOPT) 2 % ophthalmic solution 1 drop 2 (two) times daily.    ezetimibe (ZETIA) 10 MG tablet TAKE 1 TABLET BY MOUTH ONCE DAILY   gabapentin (NEURONTIN) 100 MG capsule Take 1-3 capsules (100-300 mg total) by mouth at bedtime. For itching on foot (Patient taking differently: Take 100-300 mg by mouth at bedtime. For itching on foot Open capsules and empty into applesauce to avoid gelatin)   Garlic 10 MG CAPS Take 15 mg by mouth daily.    ketoconazole (NIZORAL) 2 % cream SMARTSIG:1 Application Topical 1 to 2 Times Daily   latanoprost (XALATAN) 0.005 % ophthalmic solution Place 1 drop into both eyes at bedtime.    Magnesium 100 MG TABS Take by mouth.    metFORMIN (GLUCOPHAGE-XR) 500 MG 24 hr tablet Take 1 tablet (500 mg total) by mouth daily with breakfast.   Multiple Vitamin (MULTIVITAMIN)  tablet Take 1 tablet by mouth daily.   OMEGA-3 FATTY ACIDS PO Take 250 mg by mouth daily.    pioglitazone (ACTOS) 15 MG tablet Take 1 tablet (15 mg total) by mouth daily.   triamcinolone (KENALOG) 0.025 % cream Apply topically.   vitamin C (ASCORBIC ACID) 500 MG tablet Take 500 mg by mouth 2 (two) times daily. Chewable   Zinc Acetate 50 MG CAPS Take 1 capsule by mouth daily.     sildenafil (REVATIO) 20 MG tablet Take 1-5 tablets one hour prior to intercourse as needed. (Patient not taking: Reported on 06/03/2021)   tadalafil (CIALIS) 20 MG tablet TAKE 0.5-1 TABLET BY MOUTH EVERY OTHER DAY AS NEEDED FOR ERECTILE DYSFUNCTION (Patient not taking: Reported on 06/03/2021)   [DISCONTINUED] gentamicin ointment (GARAMYCIN) 0.1 % Apply topically.   No facility-administered encounter medications on file as of 06/03/2021.    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    Diabetes mellitus without complication (HCC)    Glaucoma    Headache    rare - 1x/mo   Nocturia    Prostate cancer [facility])    Past Surgical History:  Procedure Laterality Date   [place] SURGERY  Oct 2015   CARDIAC CATHETERIZATION  2002   1 stent   COLONOSCOPY     EYE SURGERY  2011, 2013   cataracts   EYE SURGERY Right May 2016   glaucoma   HERNIA REPAIR Left 1967   HERNIA REPAIR Right 05-10-14   Right femoral hernia repair Dr Bary Castilla with PerFix plug, no onlay mesh.Marland Kitchen    PHOTOCOAGULATION WITH LASER Right 12/30/2015   Procedure: PHOTOCOAGULATION WITH LASER;  Surgeon: Ronnell Freshwater, MD;  Location: [place];  Service: Ophthalmology;  Laterality: Right;  DIABETIC - oral meds IVA BLOCK   PROSTATE SURGERY  Oct 2015   Family History  Problem Relation Age of Onset   Diabetes Sister    Diabetes Brother    Diabetes Sister    Prostate cancer Neg Hx    Bladder Cancer Neg Hx    Social History   Socioeconomic History   Marital status: Married    Spouse name: Onalee Hua   Number of children: 4   Years of education: some college   Highest education level: 12th grade  Occupational History    Employer: RETIRED  Tobacco Use   Smoking status:  Never   Smokeless tobacco: Never   Tobacco comments:    smoking cessation materials not required  Vaping Use   Vaping Use: Never used  Substance and Sexual Activity   Alcohol use: No   Drug use: No   Sexual activity: Yes  Other Topics Concern   Not on file  Social History Narrative   Not on file   Social Determinants of Health   Financial Resource Strain: Low Risk    Difficulty of Paying Living Expenses: Not hard at all  Food Insecurity: No Food Insecurity   Worried About Charity fundraiser in the Last Year: Never true   [place] in the Last Year: Never true  Transportation Needs: No Transportation Needs   Lack of Transportation (Medical): No   Lack of Transportation (Non-Medical): No  Physical Activity: Sufficiently Active  Days of Exercise per Week: 4 days   Minutes of Exercise per Session: 40 min  Stress: No Stress Concern Present   Feeling of Stress : Not at all  Social Connections: Socially Integrated   Frequency of Communication with Friends and Family: More than three times a week   Frequency of Social Gatherings with Friends and Family: More than three times a week   Attends Religious Services: More than 4 times per year   Active Member of [employer] or Organizations: Yes   Attends Music therapist: More than 4 times per year   Marital Status: Married    Tobacco Counseling Counseling given: Not Answered Tobacco comments: smoking cessation materials not required   Clinical Intake:  Pre-visit preparation completed: Yes  Pain : No/denies pain     Nutritional Risks: None Diabetes: Yes CBG done?: No Did pt. bring in CBG monitor from home?: No  How often do you need to have someone help you when you read instructions, pamphlets, or other written materials from your doctor or pharmacy?: 1 - Never  Nutrition Risk Assessment:  Has the patient had any N/V/D within the last 2 months?  No  Does the patient have any non-healing wounds?  No   Has the patient had any unintentional weight loss or weight gain?  No   Diabetes:  Is the patient diabetic?  Yes  If diabetic, was a CBG obtained today?  No  Did the patient bring in their glucometer from home?  No  How often do you monitor your CBG's? Daily fasting am.   Financial Strains and Diabetes Management:  Are you having any financial strains with the device, your supplies or your medication? No .  Does the patient want to be seen by Chronic Care Management for management of their diabetes?  No  - already enrolled Would the patient like to be referred to a Nutritionist or for Diabetic Management?  No   Diabetic Exams:  Diabetic Eye Exam: Completed 08/09/20 negative retinopathy.   Diabetic Foot Exam: Completed 06/21/20.   Interpreter Needed?: No  Information entered by :: Clemetine Marker LPN   Activities of Daily Living In your present state of health, do you have any difficulty performing the following activities: 06/03/2021 03/20/2021  Hearing? N N  Vision? N N  Difficulty concentrating or making decisions? N N  Walking or climbing stairs? N N  Dressing or bathing? N N  Doing errands, shopping? N N  Preparing Food and eating ? N -  Using the Toilet? N -  In the past six months, have you accidently leaked urine? N -  Do you have problems with loss of bowel control? N -  Managing your Medications? N -  Managing your Finances? N -  Housekeeping or managing your Housekeeping? N -  Some recent data might be hidden    Patient Care Team: [name], MD as PCP - General (Family Medicine) Yolonda Kida, MD as Consulting Physician (Cardiology) Hollice Espy, MD as Consulting Physician (Urology) Leandrew Koyanagi, MD as Consulting Physician (Ophthalmology) Gardiner Barefoot, DPM as Consulting Physician (Podiatry) Germaine Pomfret, [facility] (Pharmacist) Earlie Server, MD as Consulting Physician (Oncology) Jannet Mantis, MD (Dermatology)  Indicate any recent  Medical Services you may have received from other than [facility] providers in the past year (date may be approximate).     Assessment:   This is a routine wellness examination for Austin Hunt.  Hearing/Vision screen Hearing Screening - Comments:: Pt denies hearing difficulty  Vision Screening - Comments:: Annual vision screenings at [place] issues and exercise activities discussed: Current Exercise Habits: Home exercise routine, Type of exercise: walking;treadmill;strength training/weights, Time (Minutes): 40, Frequency (Times/Week): 4, Weekly Exercise (Minutes/Week): 160, Intensity: Moderate, Exercise limited by: None identified   Goals Addressed   None    Depression Screen PHQ 2/9 Scores 06/03/2021 03/20/2021 11/05/2020 09/18/2020 06/21/2020 05/30/2020 02/20/2020  PHQ - 2 Score 0 0 0 0 0 0 0  PHQ- 9 Score - - - - 0 - 0    Fall Risk Fall Risk  06/03/2021 03/20/2021 11/05/2020 09/18/2020 06/21/2020  Falls in the past year? 0 0 0 0 0  Number falls in past yr: 0 0 0 0 -  Injury with Fall? 0 0 0 0 -  Risk for fall due to : No Fall Risks - - - -  Risk for fall due to: Comment - - - - -  Follow up Falls prevention discussed - Falls evaluation completed - -    FALL RISK PREVENTION PERTAINING TO THE HOME:  Any stairs in or around the home? Yes  If so, are there any without handrails? No  Home free of loose throw rugs in walkways, pet beds, electrical cords, etc? Yes  Adequate lighting in your home to reduce risk of falls? Yes   ASSISTIVE DEVICES UTILIZED TO PREVENT FALLS:  Life alert? No  Use of a cane, walker or w/c? No  Grab bars in the bathroom? Yes  Shower chair or bench in shower? Yes  Elevated toilet seat or a handicapped toilet? No   TIMED UP AND GO:  Was the test performed? No . Telephonic visit.   Cognitive Function: Normal cognitive status assessed by direct observation by this Nurse Health Advisor. No abnormalities found.       6CIT Screen 05/30/2020 05/30/2019  05/24/2018 08/31/2016  What Year? 0 points 0 points 0 points 0 points  What month? 0 points 0 points 0 points 0 points  What time? 0 points 0 points 0 points 0 points  Count back from 20 0 points 0 points 0 points 0 points  Months in reverse 0 points 0 points 0 points 0 points  Repeat phrase 0 points 0 points 6 points 0 points  Total Score 0 0 6 0    Immunizations Immunization History  Administered Date(s) Administered   Fluad Quad(high Dose 65+) 08/01/2019, 08/19/2020   Influenza Split 07/02/2010   Influenza, High Dose Seasonal PF 08/14/2015, 07/30/2016, 08/19/2017, 08/09/2018   Influenza, Seasonal, Injecte, Preservative Fre 07/08/2011, 07/22/2012   Influenza,inj,Quad PF,6+ Mos 07/11/2013   PFIZER(Purple Top)SARS-COV-2 Vaccination 11/22/2019, 12/16/2019, 08/04/2020, 01/30/2021   Pneumococcal Conjugate-13 01/22/2014   Pneumococcal-Unspecified 01/03/2008   Tdap 11/04/2010, 11/05/2020   Zoster Recombinat (Shingrix) 09/13/2020   Zoster, Live 06/14/2008    TDAP status: Up to date  Flu Vaccine status: Up to date  Pneumococcal vaccine status: Up to date  Covid-19 vaccine status: Completed vaccines  Qualifies for Shingles Vaccine? Yes   Zostavax completed Yes   Shingrix Completed?: Yes; will contact [facility] for second dose information  Screening Tests Health Maintenance  Topic Date Due   Zoster Vaccines- Shingrix (2 of 2) 11/08/2020   COVID-19 Vaccine (5 - Booster for Pfizer series) 06/01/2021   INFLUENZA VACCINE  06/02/2021   URINE MICROALBUMIN  06/21/2021   FOOT EXAM  06/21/2021   OPHTHALMOLOGY EXAM  08/09/2021   HEMOGLOBIN A1C  09/20/2021   TETANUS/TDAP  11/05/2030   PNA vac  Low Risk Adult  Completed   HPV VACCINES  Aged Out    Health Maintenance  Health Maintenance Due  Topic Date Due   Zoster Vaccines- Shingrix (2 of 2) 11/08/2020   COVID-19 Vaccine (5 - Booster for Pfizer series) 06/01/2021   INFLUENZA VACCINE  06/02/2021   URINE MICROALBUMIN   06/21/2021    Colorectal cancer screening: No longer required.   Lung Cancer Screening: (Low Dose CT Chest recommended if Age 41-80 years, 30 pack-year currently smoking OR have quit w/in 15years.) does not qualify.   Additional Screening:  Hepatitis C Screening: does not qualify  Vision Screening: Recommended annual ophthalmology exams for early detection of glaucoma and other disorders of the eye. Is the patient up to date with their annual eye exam?  Yes  Who is the provider or what is the name of the office in which the patient attends annual eye exams? [facility].   Dental Screening: Recommended annual dental exams for proper oral hygiene  Community Resource Referral / Chronic Care Management: CRR required this visit?  No   CCM required this visit?  No      Plan:     I have personally reviewed and noted the following in the patient's chart:   Medical and social history Use of alcohol, tobacco or illicit drugs  Current medications and supplements including opioid prescriptions. Patient is not currently taking opioid prescriptions. Functional ability and status Nutritional status Physical activity Advanced directives List of other physicians Hospitalizations, surgeries, and ER visits in previous 12 months Vitals Screenings to include cognitive, depression, and falls Referrals and appointments  In addition, I have reviewed and discussed with patient certain preventive protocols, quality metrics, and best practice recommendations. A written personalized care plan for preventive services as well as general preventive health recommendations were provided to patient.     Clemetine Marker, LPN   04/10/2492   Nurse Notes: none

## 2021-06-15 ENCOUNTER — Other Ambulatory Visit: Payer: Self-pay | Admitting: Family Medicine

## 2021-06-15 DIAGNOSIS — E785 Hyperlipidemia, unspecified: Secondary | ICD-10-CM

## 2021-06-15 NOTE — Telephone Encounter (Signed)
Requested Prescriptions  Pending Prescriptions Disp Refills  . ezetimibe (ZETIA) 10 MG tablet [Pharmacy Med Name: EZETIMIBE 10 MG TAB] 90 tablet 0    Sig: TAKE 1 TABLET BY MOUTH ONCE DAILY     Cardiovascular:  Antilipid - Sterol Transport Inhibitors Failed - 06/15/2021  1:37 PM      Failed - Total Cholesterol in normal range and within 360 days    Cholesterol, Total  Date Value Ref Range Status  04/08/2015 243 (H) 100 - 199 mg/dL Final   Cholesterol  Date Value Ref Range Status  11/05/2020 207 (H) <200 mg/dL Final         Failed - LDL in normal range and within 360 days    LDL Cholesterol (Calc)  Date Value Ref Range Status  11/05/2020 128 (H) mg/dL (calc) Final    Comment:    Reference range: <100 . Desirable range <100 mg/dL for primary prevention;   <70 mg/dL for patients with CHD or diabetic patients  with > or = 2 CHD risk factors. Marland Kitchen LDL-C is now calculated using the Martin-Hopkins  calculation, which is a validated novel method providing  better accuracy than the Friedewald equation in the  estimation of LDL-C.  Cresenciano Genre et al. Annamaria Helling. [id number]): [id number]  (http://education.[facility].com/faq/FAQ164)          Passed - HDL in normal range and within 360 days    HDL  Date Value Ref Range Status  11/05/2020 60 > OR = 40 mg/dL Final  04/08/2015 55 >39 mg/dL Final    Comment:    According to ATP-III Guidelines, HDL-C >59 mg/dL is considered a negative risk factor for CHD.          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 [facility])   [facility] [place], Drue Stager, MD   7 months ago Controlled type 2 diabetes with neuropathy [facility])   [facility] [name], MD   9  months ago Constipation, unspecified constipation type   [place], [place]   11 months ago Controlled type 2 diabetes with neuropathy [facility])   [facility] [name], MD   1 year ago Leukopenia, unspecified type   [facility] [name], MD      Future Appointments            In 1 month Ancil Boozer, Drue Stager, MD [facility] [place]   In 11 months Hollice Espy, [facility]   In 11 months  [facility], [facility]

## 2021-06-19 ENCOUNTER — Ambulatory Visit: Payer: Medicare Other | Admitting: Podiatry

## 2021-07-14 DIAGNOSIS — Z23 Encounter for immunization: Secondary | ICD-10-CM | POA: Diagnosis not present

## 2021-07-14 DIAGNOSIS — U071 COVID-19: Secondary | ICD-10-CM | POA: Diagnosis not present

## 2021-07-17 ENCOUNTER — Encounter: Payer: Self-pay | Admitting: Podiatry

## 2021-07-17 ENCOUNTER — Telehealth: Payer: Self-pay

## 2021-07-17 ENCOUNTER — Ambulatory Visit (INDEPENDENT_AMBULATORY_CARE_PROVIDER_SITE_OTHER): Payer: Medicare Other | Admitting: Podiatry

## 2021-07-17 ENCOUNTER — Other Ambulatory Visit: Payer: Self-pay

## 2021-07-17 ENCOUNTER — Encounter (INDEPENDENT_AMBULATORY_CARE_PROVIDER_SITE_OTHER): Payer: Self-pay

## 2021-07-17 DIAGNOSIS — E119 Type 2 diabetes mellitus without complications: Secondary | ICD-10-CM | POA: Diagnosis not present

## 2021-07-17 DIAGNOSIS — B351 Tinea unguium: Secondary | ICD-10-CM | POA: Diagnosis not present

## 2021-07-17 DIAGNOSIS — M79676 Pain in unspecified toe(s): Secondary | ICD-10-CM

## 2021-07-17 NOTE — Progress Notes (Signed)
This patient returns to my office for at risk foot care.  This patient requires this care by a professional since this patient will be at risk due to having type 2 diabetes.   This patient is unable to cut nails himself since the patient cannot reach his nails.These nails are painful walking and wearing shoes.  This patient presents for at risk foot care today.  General Appearance  Alert, conversant and in no acute stress.  Vascular  Dorsalis pedis and posterior tibial  pulses are weakly  palpable  bilaterally.  Capillary return is within normal limits  bilaterally. Cold feet.  Bilaterally. Absent digital hair.  Neurologic  Senn-Weinstein monofilament wire test within normal limits  bilaterally. Muscle power within normal limits bilaterally.  Nails Thick disfigured discolored nails with subungual debris  from hallux to fifth toes bilaterally. No evidence of bacterial infection or drainage bilaterally.  Orthopedic  No limitations of motion  feet .  No crepitus or effusions noted.  No bony pathology or digital deformities noted.  HAV  B/L.  Bony prominence fifth metabase.    Skin  normotropic skin with no porokeratosis noted bilaterally.  No signs of infections or ulcers noted.     Onychomycosis  Pain in right toes  Pain in left toes  Consent was obtained for treatment procedures.   Mechanical debridement of nails 1-5  bilaterally performed with a nail nipper.  Filed with dremel without incident.    Return office visit   4 months                   Told patient to return for periodic foot care and evaluation due to potential at risk complications.   Gardiner Barefoot DPM

## 2021-07-17 NOTE — Progress Notes (Signed)
Chronic Care Management Pharmacy Assistant   Name: Austin Hunt  MRN: [id number] DOB: [birth year]  Reason for Encounter: Diabetes and Hypertension Disease State Call   Recent office visits:  07/21/2021 [name], MD (PCP Office Visit) for Follow-up - Started on Polyethylene Glycol 3350 Discontinued Tadalafil 20 mg due to patient not taking.   06/03/2021 Clemetine Marker, LPN (PCP Office) for Medicare Wellness Exam  Recent consult visits:  None ID  Hospital visits:  None in previous 6 months  Medications: Outpatient Encounter Medications as of 07/17/2021  Medication Sig Note   ACCU-CHEK GUIDE test strip USE AS DIRECTED. 4 TIMES DAILY    Accu-Chek Softclix Lancets lancets TEST 4 TIMES DAILY    aspirin 81 MG tablet Take 81 mg by mouth daily.     Blood Glucose Monitoring Suppl (ACCU-CHEK GUIDE ME) w/Device KIT     brimonidine-timolol (COMBIGAN) 0.2-0.5 % ophthalmic solution Place 1 drop into both eyes every 12 (twelve) hours.    Cholecalciferol (VITAMIN D) 50 MCG (2000 UT) CAPS Take 1 capsule (2,000 Units total) by mouth daily.    Cyanocobalamin (B-12) 1000 MCG SUBL Place 1 tablet under the tongue daily.    docusate sodium (COLACE) 100 MG capsule Take 100 mg by mouth daily.    dorzolamide (TRUSOPT) 2 % ophthalmic solution 1 drop 2 (two) times daily.     ezetimibe (ZETIA) 10 MG tablet TAKE 1 TABLET BY MOUTH ONCE DAILY    gabapentin (NEURONTIN) 100 MG capsule Take 1-3 capsules (100-300 mg total) by mouth at bedtime. For itching on foot (Patient taking differently: Take 100-300 mg by mouth at bedtime. For itching on foot Open capsules and empty into applesauce to avoid gelatin)    Garlic 10 MG CAPS Take 15 mg by mouth daily.     ketoconazole (NIZORAL) 2 % cream SMARTSIG:1 Application Topical 1 to 2 Times Daily 06/03/2021: PRN only   latanoprost (XALATAN) 0.005 % ophthalmic solution Place 1 drop into both eyes at bedtime.     Magnesium 100 MG TABS Take by mouth.     metFORMIN  (GLUCOPHAGE-XR) 500 MG 24 hr tablet Take 1 tablet (500 mg total) by mouth daily with breakfast.    Multiple Vitamin (MULTIVITAMIN) tablet Take 1 tablet by mouth daily.    OMEGA-3 FATTY ACIDS PO Take 250 mg by mouth daily.     pioglitazone (ACTOS) 15 MG tablet Take 1 tablet (15 mg total) by mouth daily.    sildenafil (REVATIO) 20 MG tablet Take 1-5 tablets one hour prior to intercourse as needed. (Patient not taking: Reported on 06/03/2021)    tadalafil (CIALIS) 20 MG tablet TAKE 0.5-1 TABLET BY MOUTH EVERY OTHER DAY AS NEEDED FOR ERECTILE DYSFUNCTION (Patient not taking: Reported on 06/03/2021)    triamcinolone (KENALOG) 0.025 % cream Apply topically.    vitamin C (ASCORBIC ACID) 500 MG tablet Take 500 mg by mouth 2 (two) times daily. Chewable    Zinc Acetate 50 MG CAPS Take 1 capsule by mouth daily.     No facility-administered encounter medications on file as of 07/17/2021.   Care Gaps: COVID-19 Vaccine Booster 5 Influenza Vaccine (last completed 08/19/2020) Diabetic Foot Exam (last completed 06/21/2020) Urine Microalbumin (last completed 08/20/201)  Star Rating Drugs: Metformin 500 mg last filled on 02/06/2021 for a 90-Day supply with [name] Pioglitazone 15 mg last filled on 07/02/2021 for a 30-Day supply with [name]  Recent Relevant Labs: Lab Results  Component Value Date/Time   HGBA1C 7.1 (A) 03/20/2021  09:46 AM   HGBA1C 7.1 (H) 11/05/2020 09:57 AM   HGBA1C 7.3 (H) 12/15/2019 10:35 AM   HGBA1C 7.2 10/19/2018 02:11 PM   HGBA1C 7.2 (A) 10/19/2018 02:11 PM   HGBA1C 7.2 (A) 10/19/2018 02:11 PM   MICROALBUR 0.4 06/21/2020 09:10 AM   MICROALBUR 0.2 06/20/2019 09:00 AM   MICROALBUR 20 01/04/2017 10:45 AM    Kidney Function Lab Results  Component Value Date/Time   CREATININE 0.84 02/24/2021 12:01 PM   CREATININE 0.83 11/05/2020 09:57 AM   CREATININE 0.95 06/21/2020 09:10 AM   GFRNONAA >60 02/24/2021 12:01 PM   GFRNONAA 83 11/05/2020 09:57 AM   GFRAA 96 11/05/2020 09:57  AM    Current antihyperglycemic regimen:  Metformin 500 mg 1 tablet daily Pioglitazone 15 mg 1 tablet daily  What recent interventions/DTPs have been made to improve glycemic control:  None ID  Have there been any recent hospitalizations or ED visits since last visit with CPP? No  Patient denies hypoglycemic symptoms, including Pale, Sweaty, Shaky, Hungry, Nervous/irritable, and Vision changes  Patient denies hyperglycemic symptoms, including blurry vision, excessive thirst, fatigue, polyuria, and weakness  How often are you checking your blood sugar? once daily  What are your blood sugars ranging?  Fasting: between 99-155  During the week, how often does your blood glucose drop below 70? Never  Are you checking your feet daily/regularly? Yes and he currently has no issues.  Adherence Review: Is the patient currently on a STATIN medication? No Is the patient currently on ACE/ARB medication? No Does the patient have >5 day gap between last estimated fill dates? Yes possibly no recent refill due to decrease in Metformin   Reviewed chart prior to disease state call. Spoke with patient regarding BP  Recent Office Vitals: BP Readings from Last 3 Encounters:  05/09/21 134/62  03/20/21 120/68  02/28/21 (!) 155/73   Pulse Readings from Last 3 Encounters:  05/09/21 (!) 105  03/20/21 72  02/28/21 65    Wt Readings from Last 3 Encounters:  05/09/21 124 lb (56.2 kg)  03/20/21 129 lb (58.5 kg)  02/28/21 132 lb (59.9 kg)     Kidney Function Lab Results  Component Value Date/Time   CREATININE 0.84 02/24/2021 12:01 PM   CREATININE 0.83 11/05/2020 09:57 AM   CREATININE 0.95 06/21/2020 09:10 AM   GFRNONAA >60 02/24/2021 12:01 PM   GFRNONAA 83 11/05/2020 09:57 AM   GFRAA 96 11/05/2020 09:57 AM    BMP Latest Ref Rng & Units 02/24/2021 11/05/2020 06/21/2020  Glucose 70 - 99 mg/dL 272(H) 130(H) 124(H)  BUN 8 - 23 mg/dL _0 Creatinine 0.61 - 1.24 mg/dL 0.84 0.83 0.95   BUN/Creat Ratio 6 - 22 (calc) - NOT APPLICABLE NOT APPLICABLE  Sodium 423 - 145 mmol/L 137 137 138  Potassium 3.5 - 5.1 mmol/L 4.0 4.3 4.5  Chloride 98 - 111 mmol/L 98 98 100  CO2 22 - 32 mmol/L 28 32 28  Calcium 8.9 - 10.3 mg/dL 9.5 9.7 9.5    Current antihypertensive regimen:  None  How often are you checking your Blood Pressure? daily  Current home BP readings: 120/68  What recent interventions/DTPs have been made by any provider to improve Blood Pressure control since last CPP Visit: None ID  Any recent hospitalizations or ED visits since last visit with CPP? No  What diet changes have been made to improve Blood Pressure Control?  Patient reports that he eats healthy and avoids salt.   What exercise is  being done to improve your Blood Pressure Control?  Patient reports that he still goes to the gym 3-4 times weekly for about 45 minutes. Patent reported that he walked for a little over a mile today.   Adherence Review: Is the patient currently on ACE/ARB medication? No Does the patient have >5 day gap between last estimated fill dates? No  Patient expressed that he likes to take as little medication as possible. He prefers to find more holistic natural ways of taking care of his symptoms if he can. He stated for his blood pressure he drinks [place] Tea and Peppermint Tea mixed together and this controls his symptoms and his blood pressure stays normal. Patient stated that so far with the Polyethylene Glycol he can't tell the difference in his bowel movements, but he just started taking this medication on Sunday so he will give it a little longer. He is still drinking Prune juice and that works for him. Patient has no concerns or issues today. Patient has my direct number if he needs anything.   Patient is schedule for a telephone follow up with the clinical pharmacist on 01/07/2022.   Lynann Bologna, CPA/CMA Clinical Pharmacist Assistant Phone: [phone]

## 2021-07-21 ENCOUNTER — Encounter: Payer: Self-pay | Admitting: Family Medicine

## 2021-07-21 ENCOUNTER — Ambulatory Visit (INDEPENDENT_AMBULATORY_CARE_PROVIDER_SITE_OTHER): Payer: Medicare Other | Admitting: Family Medicine

## 2021-07-21 VITALS — Ht 69.0 in | Wt 124.0 lb

## 2021-07-21 DIAGNOSIS — K5904 Chronic idiopathic constipation: Secondary | ICD-10-CM | POA: Diagnosis not present

## 2021-07-21 DIAGNOSIS — G720 Drug-induced myopathy: Secondary | ICD-10-CM | POA: Diagnosis not present

## 2021-07-21 DIAGNOSIS — E1140 Type 2 diabetes mellitus with diabetic neuropathy, unspecified: Secondary | ICD-10-CM | POA: Diagnosis not present

## 2021-07-21 DIAGNOSIS — E1139 Type 2 diabetes mellitus with other diabetic ophthalmic complication: Secondary | ICD-10-CM | POA: Diagnosis not present

## 2021-07-21 DIAGNOSIS — E1136 Type 2 diabetes mellitus with diabetic cataract: Secondary | ICD-10-CM

## 2021-07-21 DIAGNOSIS — C61 Malignant neoplasm of prostate: Secondary | ICD-10-CM | POA: Diagnosis not present

## 2021-07-21 DIAGNOSIS — N521 Erectile dysfunction due to diseases classified elsewhere: Secondary | ICD-10-CM | POA: Diagnosis not present

## 2021-07-21 DIAGNOSIS — E441 Mild protein-calorie malnutrition: Secondary | ICD-10-CM | POA: Diagnosis not present

## 2021-07-21 DIAGNOSIS — Z23 Encounter for immunization: Secondary | ICD-10-CM

## 2021-07-21 DIAGNOSIS — H42 Glaucoma in diseases classified elsewhere: Secondary | ICD-10-CM

## 2021-07-21 DIAGNOSIS — E785 Hyperlipidemia, unspecified: Secondary | ICD-10-CM

## 2021-07-21 DIAGNOSIS — E1169 Type 2 diabetes mellitus with other specified complication: Secondary | ICD-10-CM

## 2021-07-21 DIAGNOSIS — T466X5A Adverse effect of antihyperlipidemic and antiarteriosclerotic drugs, initial encounter: Secondary | ICD-10-CM

## 2021-07-21 MED ORDER — EZETIMIBE 10 MG PO TABS: 10.0000 mg | ORAL_TABLET | Freq: Every day | ORAL | 1 refills | Status: DC

## 2021-07-21 MED ORDER — METFORMIN HCL ER 500 MG PO TB24: 500.0000 mg | ORAL_TABLET | Freq: Every day | ORAL | 1 refills | Status: DC

## 2021-07-21 MED ORDER — POLYETHYLENE GLYCOL 3350 17 GM/SCOOP PO POWD: 17.0000 g | Freq: Every day | ORAL | 1 refills | Status: DC

## 2021-07-21 MED ORDER — PIOGLITAZONE HCL 15 MG PO TABS: 15.0000 mg | ORAL_TABLET | Freq: Every day | ORAL | 1 refills | Status: DC

## 2021-07-21 NOTE — Progress Notes (Signed)
Name: Austin Hunt   MRN: [id number]    DOB: Oct 10, 1941   Date:07/21/2021       Progress Note  Subjective  Chief Complaint  Follow Up  I connected with  Austin Hunt on 07/21/21 at 11:20 AM EDT by telephone and verified that I am speaking with the correct person using two identifiers.  I discussed the limitations, risks, security and privacy concerns of performing an evaluation and management service by telephone and the availability of in person appointments. Staff also discussed with the patient that there may be a patient responsible charge related to this service. Patient agreed on having a virtual visit  Patient Location: at home Provider Location: at home  Additional Individuals present: alone   HPI  DMII: glucose at home has been well controlled in the low 100's .Last A1C today is 7.1 % , since it is a virtual visit he will go to our office tomorrow for A1C and flu vaccine.  He denies polyphagia, polydipsia or polyuria. Vision is stable he has glaucoma ( uses eye drops) and cataract surgery.  He also has ED and sees Urologist and takes Revatio,  He is only on Metformin  and Actos , he follows a diabetic diet. He is on Zetia since intolerant to statin therapy.    Leukopenia: chronic : currently under the care of Dr. Tasia Hunt and reassuring labs and was released from her care in April 2022 . Unchanged    BPH and prostate cancer: he is seeing Urologist and denies any obstructive symptoms, except for nocturia about 1-  2 times per night.  Incidental finding of prostate cancer during TURP back in 2015 , last PSA was done June 2021 and it was 2.12 , he is now on yearly follow up with Dr. Caryl Hunt  and last PSA down to 1.29 in July 2022   History of HTN : patient is off medication, no chest pain or palpitation, he states bp at home has been controlled, last night was 142/73, usually around 130-135/70's-80's. BP back to normal since weight loss.    Dyslipidemia: he is not on statin therapy,  myalgia in the past, he is aking Zetia now and last LDL was slightly better but still not at goal. He does not want injectable medication or add Vascepa  Chronic constipation: He has increased water intake , he drinks prune juice prn . He states he has been having bowel movements about 3  times a week, but last bowel movement was this past week. He states  Amitiza is too costly, he has been taking Mirallax but has been out of medication lately    SOB with activity: He had a stress test done by Dr. Clayborn Hunt for evaluation of chest pain back in 11/2018 and results were normal . He recently went back and had an EKG  May 9 th 2022  NORMAL LEFT VENTRICULAR SYSTOLIC FUNCTION  NORMAL RIGHT VENTRICULAR SYSTOLIC FUNCTION  MILD VALVULAR REGURGITATION (See above)  NO VALVULAR STENOSIS  ESTIMATED LVEF >55%    11/2018:  Normal myocardial perfusion scan no evidence of stress-induced  myocardial ischemia ejection fraction of 64% conclusion negative scan.   Low risk scan   Recurrent rash on groin: seen by Dr. 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 ([place]) 07/30/2016   Long term current use of systemic steroids 01/07/2016   Neutropenia ([place]) 01/07/2016   Anxiety 04/08/2015   Carotid artery narrowing 04/08/2015   Chronic constipation 04/08/2015   Brain syndrome, 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   Failure of erection 04/08/2015   H/O inguinal hernia repair 04/08/2015   HLD (hyperlipidemia) 04/08/2015   Adaptive colitis 04/08/2015   LBP (low back pain) 04/08/2015   MI (mitral incompetence) 04/08/2015   Drug intolerance 04/08/2015   Kidney lump  04/08/2015   TI (tricuspid incompetence) 04/08/2015   Unilateral recurrent femoral hernia without obstruction or gangrene 05/23/2014   Femoral hernia 05/23/2014   Right inguinal hernia 02/06/2014   Cataract 01/16/2014   Glaucoma 01/16/2014   Reflux esophagitis 06/21/2009   Coronary atherosclerosis 04/03/2009   Benign enlargement of prostate 02/11/2009   Decreased libido 01/12/2008   Benign essential HTN 09/12/2007    Past Surgical History:  Procedure Laterality Date   APPENDECTOMY  1954   BLADDER SURGERY  Oct 2015   CARDIAC CATHETERIZATION  2002   1 stent   COLONOSCOPY     EYE SURGERY  2011, 2013   cataracts   EYE SURGERY Right May 2016   glaucoma   HERNIA REPAIR Left 1967   HERNIA REPAIR Right 05-10-14   Right femoral hernia repair Dr Bary Castilla with PerFix plug, no onlay mesh.Marland Kitchen    PHOTOCOAGULATION WITH LASER Right 12/30/2015   Procedure: PHOTOCOAGULATION WITH LASER;  Surgeon: Ronnell Freshwater, MD;  Location: [facility];  Service: Ophthalmology;  Laterality: Right;  DIABETIC - oral meds IVA BLOCK   PROSTATE SURGERY  Oct 2015    Family History  Problem Relation Age of Onset   Diabetes Sister    Diabetes Brother    Diabetes Sister    Prostate cancer Neg Hx    Bladder Cancer Neg Hx     Social History   Socioeconomic History   Marital status: Married    Spouse name: Austin Hunt   Number of children: 4   Years of education: some college   Highest education level: 12th grade  Occupational History    Employer: RETIRED  Tobacco Use   Smoking status: Never   Smokeless tobacco: Never   Tobacco comments:    smoking cessation materials not required  Vaping Use   Vaping Use: Never used  Substance and Sexual Activity   Alcohol use: No   Drug use: No   Sexual activity: Yes  Other Topics Concern   Not on file  Social History Narrative   Not on file   Social Determinants of Health   Financial Resource Strain: Low Risk    Difficulty of Paying Living  Expenses: Not hard at all  Food Insecurity: No Food Insecurity   Worried About Charity fundraiser in the Last Year: Never true   [place] in the Last Year: Never true  Transportation Needs: No Transportation Needs   Lack of Transportation (Medical): No   Lack of Transportation (Non-Medical): No  Physical Activity: Sufficiently Active   Days of Exercise per Week: 4 days   Minutes of Exercise per Session: 40 min  Stress: No Stress Concern Present   Feeling of Stress : Not at all  Social Connections: Socially Integrated   Frequency of Communication with Friends and Family: More than three times a week  Frequency of Social Gatherings with Friends and Family: More than three times a week   Attends Religious Services: More than 4 times per year   Active Member of Clubs or Organizations: Yes   Attends Music therapist: More than 4 times per year   Marital Status: Married  Human resources officer Violence: Not At Risk   Fear of Current or Ex-Partner: No   Emotionally Abused: No   Physically Abused: No   Sexually Abused: No     Current Outpatient Medications:    ACCU-CHEK GUIDE test strip, USE AS DIRECTED. 4 TIMES DAILY, Disp: 100 each, Rfl: 10   Accu-Chek Softclix Lancets lancets, TEST 4 TIMES DAILY, Disp: 100 each, Rfl: 5   aspirin 81 MG tablet, Take 81 mg by mouth daily. , Disp: , Rfl:    Blood Glucose Monitoring Suppl (ACCU-CHEK GUIDE ME) w/Device KIT, , Disp: , Rfl:    brimonidine-timolol (COMBIGAN) 0.2-0.5 % ophthalmic solution, Place 1 drop into both eyes every 12 (twelve) hours., Disp: , Rfl:    Cholecalciferol (VITAMIN D) 50 MCG (2000 UT) CAPS, Take 1 capsule (2,000 Units total) by mouth daily., Disp: 30 capsule, Rfl: 0   Cyanocobalamin (B-12) 1000 MCG SUBL, Place 1 tablet under the tongue daily., Disp: 30 tablet, Rfl: 0   docusate sodium (COLACE) 100 MG capsule, Take 100 mg by mouth daily., Disp: , Rfl:    dorzolamide (TRUSOPT) 2 % ophthalmic solution, 1 drop 2  (two) times daily. , Disp: , Rfl:    ezetimibe (ZETIA) 10 MG tablet, TAKE 1 TABLET BY MOUTH ONCE DAILY, Disp: 90 tablet, Rfl: 0   gabapentin (NEURONTIN) 100 MG capsule, Take 1-3 capsules (100-300 mg total) by mouth at bedtime. For itching on foot (Patient taking differently: Take 100-300 mg by mouth at bedtime. For itching on foot Open capsules and empty into applesauce to avoid gelatin), Disp: 90 capsule, Rfl: 0   Garlic 10 MG CAPS, Take 15 mg by mouth daily. , Disp: , Rfl:    ketoconazole (NIZORAL) 2 % cream, SMARTSIG:1 Application Topical 1 to 2 Times Daily, Disp: , Rfl:    latanoprost (XALATAN) 0.005 % ophthalmic solution, Place 1 drop into both eyes at bedtime. , Disp: , Rfl:    Magnesium 100 MG TABS, Take by mouth. , Disp: , Rfl:    metFORMIN (GLUCOPHAGE-XR) 500 MG 24 hr tablet, Take 1 tablet (500 mg total) by mouth daily with breakfast., Disp: 90 tablet, Rfl: 0   Multiple Vitamin (MULTIVITAMIN) tablet, Take 1 tablet by mouth daily., Disp: , Rfl:    OMEGA-3 FATTY ACIDS PO, Take 250 mg by mouth daily. , Disp: , Rfl:    pioglitazone (ACTOS) 15 MG tablet, Take 1 tablet (15 mg total) by mouth daily., Disp: 90 tablet, Rfl: 1   sildenafil (REVATIO) 20 MG tablet, Take 1-5 tablets one hour prior to intercourse as needed. (Patient not taking: Reported on 06/03/2021), Disp: 30 tablet, Rfl: 6   tadalafil (CIALIS) 20 MG tablet, TAKE 0.5-1 TABLET BY MOUTH EVERY OTHER DAY AS NEEDED FOR ERECTILE DYSFUNCTION (Patient not taking: Reported on 06/03/2021), Disp: 30 tablet, Rfl: 0   triamcinolone (KENALOG) 0.025 % cream, Apply topically., Disp: , Rfl:    vitamin C (ASCORBIC ACID) 500 MG tablet, Take 500 mg by mouth 2 (two) times daily. Chewable, Disp: , Rfl:    Zinc Acetate 50 MG CAPS, Take 1 capsule by mouth daily. , Disp: , Rfl:   Allergies  Allergen Reactions   Colesevelam Other (See Comments)  Penicillins Itching and Other (See Comments)   Statins Other (See Comments)    myalgia   Welchol [Colesevelam Hcl]      I personally reviewed active problem list, medication list, allergies, family history, social history with the patient/caregiver today.   ROS  Ten systems reviewed and is negative except as mentioned in HPI   Objective  Virtual encounter, vitals not obtained.  There is no height or weight on file to calculate BMI.  Physical Exam  Awake, alert and oriented   PHQ2/9: Depression screen [facility] 2/9 06/03/2021 03/20/2021 11/05/2020 09/18/2020 06/21/2020  Decreased Interest 0 0 0 0 0  Down, Depressed, Hopeless 0 0 0 0 0  PHQ - 2 Score 0 0 0 0 0  Altered sleeping - - - - 0  Tired, decreased energy - - - - 0  Change in appetite - - - - 0  Feeling bad or failure about yourself  - - - - 0  Trouble concentrating - - - - 0  Moving slowly or fidgety/restless - - - - 0  Suicidal thoughts - - - - 0  PHQ-9 Score - - - - 0  Difficult doing work/chores - - - - -  Some recent data might be hidden   PHQ-2/9 Result is negative.    Fall Risk: Fall Risk  06/03/2021 03/20/2021 11/05/2020 09/18/2020 06/21/2020  Falls in the past year? 0 0 0 0 0  Number falls in past yr: 0 0 0 0 -  Injury with Fall? 0 0 0 0 -  Risk for fall due to : No Fall Risks - - - -  Risk for fall due to: Comment - - - - -  Follow up Falls prevention discussed - Falls evaluation completed - -     Assessment & Plan  1. 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  Dispense: 90 tablet; Refill: 1  2. Hyperlipidemia associated with type 2 diabetes mellitus ([place])   3. 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  Dispense: 90 tablet; Refill: 1 - pioglitazone (ACTOS) 15 MG tablet; Take 1 tablet (15 mg total) by mouth daily.  Dispense: 90 tablet; Refill: 1  4. Prostate cancer ([place])   5.  Dyslipidemia  - ezetimibe (ZETIA) 10 MG tablet; Take 1 tablet (10 mg total) by mouth daily.  Dispense: 90 tablet; Refill: 1  6. Statin myopathy   7. 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  Dispense: 90 tablet; Refill: 1  8. Mild protein-calorie malnutrition ([place])   9. Chronic idiopathic constipation  - polyethylene glycol powder (GLYCOLAX/MIRALAX) 17 GM/SCOOP powder; Take 17 g by mouth daily.  Dispense: 3350 g; Refill: 1 .  I discussed the assessment and treatment plan with the patient. The patient was provided an opportunity to ask questions and all were answered. The patient agreed with the plan and demonstrated an understanding of the instructions.   The patient was advised to call back or seek an in-person evaluation if the symptoms worsen or if the condition fails to improve as anticipated.  I provided 25 minutes of non-face-to-face time during this encounter.  Carlene Coria, CMA

## 2021-07-22 ENCOUNTER — Other Ambulatory Visit: Payer: Self-pay

## 2021-07-22 DIAGNOSIS — H42 Glaucoma in diseases classified elsewhere: Secondary | ICD-10-CM | POA: Diagnosis not present

## 2021-07-22 DIAGNOSIS — Z23 Encounter for immunization: Secondary | ICD-10-CM | POA: Diagnosis not present

## 2021-07-22 DIAGNOSIS — E1169 Type 2 diabetes mellitus with other specified complication: Secondary | ICD-10-CM | POA: Diagnosis not present

## 2021-07-22 DIAGNOSIS — E1136 Type 2 diabetes mellitus with diabetic cataract: Secondary | ICD-10-CM

## 2021-07-22 DIAGNOSIS — E1140 Type 2 diabetes mellitus with diabetic neuropathy, unspecified: Secondary | ICD-10-CM | POA: Diagnosis not present

## 2021-07-22 DIAGNOSIS — K5904 Chronic idiopathic constipation: Secondary | ICD-10-CM | POA: Diagnosis not present

## 2021-07-22 DIAGNOSIS — E441 Mild protein-calorie malnutrition: Secondary | ICD-10-CM | POA: Diagnosis not present

## 2021-07-22 DIAGNOSIS — E785 Hyperlipidemia, unspecified: Secondary | ICD-10-CM | POA: Diagnosis not present

## 2021-07-22 DIAGNOSIS — C61 Malignant neoplasm of prostate: Secondary | ICD-10-CM | POA: Diagnosis not present

## 2021-07-22 DIAGNOSIS — G720 Drug-induced myopathy: Secondary | ICD-10-CM | POA: Diagnosis not present

## 2021-07-22 DIAGNOSIS — N521 Erectile dysfunction due to diseases classified elsewhere: Secondary | ICD-10-CM | POA: Diagnosis not present

## 2021-07-22 DIAGNOSIS — E1139 Type 2 diabetes mellitus with other diabetic ophthalmic complication: Secondary | ICD-10-CM | POA: Diagnosis not present

## 2021-07-22 LAB — POCT GLYCOSYLATED HEMOGLOBIN (HGB A1C): Hemoglobin A1C: 7.4 % — AB (ref 4.0–5.6)

## 2021-07-22 NOTE — Addendum Note (Signed)
Addended by: Royal Hawthorn on: 07/22/2021 02:49 PM   Modules accepted: Orders

## 2021-08-19 DIAGNOSIS — H401133 Primary open-angle glaucoma, bilateral, severe stage: Secondary | ICD-10-CM | POA: Diagnosis not present

## 2021-08-19 LAB — HM DIABETES EYE EXAM

## 2021-09-23 DIAGNOSIS — R0609 Other forms of dyspnea: Secondary | ICD-10-CM | POA: Diagnosis not present

## 2021-09-23 DIAGNOSIS — Z955 Presence of coronary angioplasty implant and graft: Secondary | ICD-10-CM | POA: Diagnosis not present

## 2021-09-23 DIAGNOSIS — I10 Essential (primary) hypertension: Secondary | ICD-10-CM | POA: Diagnosis not present

## 2021-09-23 DIAGNOSIS — I208 Other forms of angina pectoris: Secondary | ICD-10-CM | POA: Diagnosis not present

## 2021-09-23 DIAGNOSIS — R001 Bradycardia, unspecified: Secondary | ICD-10-CM | POA: Diagnosis not present

## 2021-09-23 DIAGNOSIS — E7800 Pure hypercholesterolemia, unspecified: Secondary | ICD-10-CM | POA: Diagnosis not present

## 2021-09-23 DIAGNOSIS — R0602 Shortness of breath: Secondary | ICD-10-CM | POA: Diagnosis not present

## 2021-09-23 DIAGNOSIS — E119 Type 2 diabetes mellitus without complications: Secondary | ICD-10-CM | POA: Diagnosis not present

## 2021-09-23 DIAGNOSIS — I2510 Atherosclerotic heart disease of native coronary artery without angina pectoris: Secondary | ICD-10-CM | POA: Diagnosis not present

## 2021-09-23 DIAGNOSIS — I25118 Atherosclerotic heart disease of native coronary artery with other forms of angina pectoris: Secondary | ICD-10-CM | POA: Diagnosis not present

## 2021-10-01 ENCOUNTER — Other Ambulatory Visit: Payer: Self-pay | Admitting: Family Medicine

## 2021-10-29 ENCOUNTER — Ambulatory Visit: Payer: Self-pay

## 2021-10-29 ENCOUNTER — Encounter: Payer: Self-pay | Admitting: Family Medicine

## 2021-10-29 ENCOUNTER — Ambulatory Visit (INDEPENDENT_AMBULATORY_CARE_PROVIDER_SITE_OTHER): Payer: Medicare Other | Admitting: Family Medicine

## 2021-10-29 VITALS — BP 120/68 | HR 54 | Temp 97.6°F | Resp 16 | Ht 69.0 in | Wt 132.0 lb

## 2021-10-29 DIAGNOSIS — N521 Erectile dysfunction due to diseases classified elsewhere: Secondary | ICD-10-CM | POA: Diagnosis not present

## 2021-10-29 DIAGNOSIS — E1140 Type 2 diabetes mellitus with diabetic neuropathy, unspecified: Secondary | ICD-10-CM

## 2021-10-29 LAB — POCT GLYCOSYLATED HEMOGLOBIN (HGB A1C): Hemoglobin A1C: 8.1 % — AB (ref 4.0–5.6)

## 2021-10-29 MED ORDER — METFORMIN HCL ER 500 MG PO TB24: 1000.0000 mg | ORAL_TABLET | Freq: Every day | ORAL | 0 refills | Status: DC

## 2021-10-29 NOTE — Telephone Encounter (Signed)
Pts Blood sugar this morning was 199/ he stated the meds are not working and his BS still goes up/ pt was schedule for a phone visit for tomorrow afternoon / please advise if needed   Left message to call back. Pt. Has appointment today.

## 2021-10-29 NOTE — Telephone Encounter (Signed)
Copied from [place] [id number]. Topic: Appointment Scheduling - Scheduling Inquiry for Clinic >> Oct 29, 2021  9:16 AM Oneta Rack wrote: Patient returning a nurse call regarding his 1:20pm telephone visit with [name] today. Patient would like a call back   Returned call and triaged pt. Austin Hunt, CMA

## 2021-10-29 NOTE — Progress Notes (Signed)
Name: Austin Hunt   MRN: [id number]    DOB: 01-19-1941   Date:10/29/2021       Progress Note  Subjective  Chief Complaint  Chief Complaint  Patient presents with   Discuss Medication    HPI  DMII: he used to take Metformin 500 mg two daily and also Actos 15 mg, we switching to only one tablet of Metformin in May 22 and DM remained controlled until recently. However over the past few weeks his fasting glucose is spiking, between 190's and 200's. He states concerned that it was because he went down on the dose of Metformin, but explained last A1C in September was at goal. He states he had cold symptoms one week ago. He had some nasal congestion and cough . He did not get tested for COVID-19. No fever or chills. No change in appetite , denies dietary changes even with holidays. No fatigue. 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 07/30/2016   Long term current use of systemic steroids 01/07/2016   Neutropenia ([place]) 01/07/2016   Anxiety 04/08/2015   Carotid artery narrowing 04/08/2015   Chronic constipation 04/08/2015   Brain syndrome, 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   Failure of erection 04/08/2015   H/O inguinal hernia repair 04/08/2015   HLD (hyperlipidemia) 04/08/2015   Adaptive colitis 04/08/2015   LBP (low back pain) 04/08/2015   MI (mitral incompetence) 04/08/2015   Drug intolerance 04/08/2015   Kidney lump 04/08/2015   TI (tricuspid incompetence) 04/08/2015   Unilateral recurrent femoral hernia without obstruction or gangrene 05/23/2014   Femoral hernia 05/23/2014   Right inguinal  hernia 02/06/2014   Cataract 01/16/2014   Glaucoma 01/16/2014   Reflux esophagitis 06/21/2009   Coronary atherosclerosis 04/03/2009   Benign enlargement of prostate 02/11/2009   Decreased libido 01/12/2008   Benign essential HTN 09/12/2007    Past Surgical History:  Procedure Laterality Date   APPENDECTOMY  1954   BLADDER SURGERY  Oct 2015   CARDIAC CATHETERIZATION  2002   1 stent   COLONOSCOPY     EYE SURGERY  2011, 2013   cataracts   EYE SURGERY Right May 2016   glaucoma   HERNIA REPAIR Left 1967   HERNIA REPAIR Right 05-10-14   Right femoral hernia repair Dr Bary Castilla with PerFix plug, no onlay mesh.Marland Kitchen    PHOTOCOAGULATION WITH LASER Right 12/30/2015   Procedure: PHOTOCOAGULATION WITH LASER;  Surgeon: Ronnell Freshwater, MD;  Location: [facility];  Service: Ophthalmology;  Laterality: Right;  DIABETIC - oral meds IVA BLOCK   PROSTATE SURGERY  Oct 2015    Family History  Problem Relation Age of Onset   Diabetes Sister    Diabetes Brother    Diabetes Sister    Prostate cancer Neg Hx    Bladder Cancer Neg Hx     Social History   Tobacco Use   Smoking status: Never   Smokeless tobacco: Never   Tobacco comments:    smoking cessation materials not required  Substance Use Topics   Alcohol use: No     Current Outpatient Medications:  ACCU-CHEK GUIDE test strip, USE AS DIRECTED. 4 TIMES DAILY, Disp: 100 each, Rfl: 10   Accu-Chek Softclix Lancets lancets, TEST 4 TIMES DAILY, Disp: 100 each, Rfl: 5   aspirin 81 MG tablet, Take 81 mg by mouth daily. , Disp: , Rfl:    Blood Glucose Monitoring Suppl (ACCU-CHEK GUIDE ME) w/Device KIT, , Disp: , Rfl:    brimonidine-timolol (COMBIGAN) 0.2-0.5 % ophthalmic solution, Place 1 drop into both eyes every 12 (twelve) hours., Disp: , Rfl:    Cholecalciferol (VITAMIN D) 50 MCG (2000 UT) CAPS, Take 1 capsule (2,000 Units total) by mouth daily., Disp: 30 capsule, Rfl: 0   Cyanocobalamin (B-12) 1000 MCG SUBL, Place 1 tablet  under the tongue daily., Disp: 30 tablet, Rfl: 0   docusate sodium (COLACE) 100 MG capsule, Take 100 mg by mouth daily., Disp: , Rfl:    dorzolamide (TRUSOPT) 2 % ophthalmic solution, 1 drop 2 (two) times daily. , Disp: , Rfl:    ezetimibe (ZETIA) 10 MG tablet, Take 1 tablet (10 mg total) by mouth daily., Disp: 90 tablet, Rfl: 1   gabapentin (NEURONTIN) 100 MG capsule, Take 1-3 capsules (100-300 mg total) by mouth at bedtime. For itching on foot (Patient taking differently: Take 100-300 mg by mouth at bedtime. For itching on foot Open capsules and empty into applesauce to avoid gelatin), Disp: 90 capsule, Rfl: 0   Garlic 10 MG CAPS, Take 15 mg by mouth daily. , Disp: , Rfl:    ketoconazole (NIZORAL) 2 % cream, SMARTSIG:1 Application Topical 1 to 2 Times Daily, Disp: , Rfl:    latanoprost (XALATAN) 0.005 % ophthalmic solution, Place 1 drop into both eyes at bedtime. , Disp: , Rfl:    Magnesium 100 MG TABS, Take by mouth. , Disp: , Rfl:    metFORMIN (GLUCOPHAGE-XR) 500 MG 24 hr tablet, Take 1 tablet (500 mg total) by mouth daily with breakfast., Disp: 90 tablet, Rfl: 1   Multiple Vitamin (MULTIVITAMIN) tablet, Take 1 tablet by mouth daily., Disp: , Rfl:    OMEGA-3 FATTY ACIDS PO, Take 250 mg by mouth daily. , Disp: , Rfl:    pioglitazone (ACTOS) 15 MG tablet, Take 1 tablet (15 mg total) by mouth daily., Disp: 90 tablet, Rfl: 1   polyethylene glycol powder (GLYCOLAX/MIRALAX) 17 GM/SCOOP powder, Take 17 g by mouth daily., Disp: 3350 g, Rfl: 1   sildenafil (REVATIO) 20 MG tablet, Take 1-5 tablets one hour prior to intercourse as needed., Disp: 30 tablet, Rfl: 6   triamcinolone (KENALOG) 0.025 % cream, Apply topically., Disp: , Rfl:    vitamin C (ASCORBIC ACID) 500 MG tablet, Take 500 mg by mouth 2 (two) times daily. Chewable, Disp: , Rfl:    Zinc Acetate 50 MG CAPS, Take 1 capsule by mouth daily. , Disp: , Rfl:   Allergies  Allergen Reactions   Colesevelam Other (See Comments)   Penicillins Itching  and Other (See Comments)   Statins Other (See Comments)    myalgia   Welchol [Colesevelam Hcl]     I personally reviewed active problem list, medication list, allergies, family history, social history, health maintenance with the patient/caregiver today.   ROS  Ten systems reviewed and is negative except as mentioned in HPI  Objective  Vitals:   10/29/21 1306  BP: 120/68  Pulse: (!) 54  Resp: 16  Temp: 97.6 F (36.4 C)  SpO2: 99%  Weight: 132 lb (59.9 kg)  Height: 5' 9"  (1.753 m)    Body mass index  is 19.49 kg/m.  Physical Exam  Constitutional: Patient appears well-developed and well-nourished.  No distress.  HEENT: head atraumatic, normocephalic, pupils equal and reactive to light, eneck supple Cardiovascular: Normal rate, regular rhythm and normal heart sounds.  No murmur heard. No BLE edema. Pulmonary/Chest: Effort normal and breath sounds normal. No respiratory distress. Abdominal: Soft.  There is no tenderness. Psychiatric: Patient has a normal mood and affect. behavior is normal. Judgment and thought content normal.   Recent Results (from the past 2160 hour(s))  HM DIABETES EYE EXAM     Status: None   Collection Time: 08/19/21 12:00 AM  Result Value Ref Range   HM Diabetic Eye Exam No Retinopathy No Retinopathy     PHQ2/9: Depression screen [facility] 2/9 10/29/2021 07/21/2021 06/03/2021 03/20/2021 11/05/2020  Decreased Interest 0 0 0 0 0  Down, Depressed, Hopeless 0 0 0 0 0  PHQ - 2 Score 0 0 0 0 0  Altered sleeping 0 - - - -  Tired, decreased energy 0 - - - -  Change in appetite 0 - - - -  Feeling bad or failure about yourself  0 - - - -  Trouble concentrating 0 - - - -  Moving slowly or fidgety/restless 0 - - - -  Suicidal thoughts 0 - - - -  PHQ-9 Score 0 - - - -  Difficult doing work/chores - - - - -  Some recent data might be hidden    phq 9 is negative   Fall Risk: Fall Risk  10/29/2021 07/21/2021 06/03/2021 03/20/2021 11/05/2020  Falls in the past year?  1 0 0 0 0  Number falls in past yr: 0 0 0 0 0  Injury with Fall? 0 0 0 0 0  Risk for fall due to : No Fall Risks - No Fall Risks - -  Risk for fall due to: Comment - - - - -  Follow up Falls prevention discussed - Falls prevention discussed - Falls evaluation completed      Functional Status Survey: Is the patient deaf or have difficulty hearing?: No Does the patient have difficulty seeing, even when wearing glasses/contacts?: No Does the patient have difficulty concentrating, remembering, or making decisions?: No Does the patient have difficulty walking or climbing stairs?: No Does the patient have difficulty dressing or bathing?: No Does the patient have difficulty doing errands alone such as visiting a doctor's office or shopping?: No    Assessment & Plan  1. 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  Dispense: 180 tablet; Refill: 0

## 2021-11-17 ENCOUNTER — Encounter: Payer: Self-pay | Admitting: Podiatry

## 2021-11-17 ENCOUNTER — Other Ambulatory Visit: Payer: Self-pay

## 2021-11-17 ENCOUNTER — Ambulatory Visit (INDEPENDENT_AMBULATORY_CARE_PROVIDER_SITE_OTHER): Payer: Medicare PPO | Admitting: Podiatry

## 2021-11-17 DIAGNOSIS — E119 Type 2 diabetes mellitus without complications: Secondary | ICD-10-CM | POA: Diagnosis not present

## 2021-11-17 DIAGNOSIS — M79676 Pain in unspecified toe(s): Secondary | ICD-10-CM | POA: Diagnosis not present

## 2021-11-17 DIAGNOSIS — B351 Tinea unguium: Secondary | ICD-10-CM | POA: Diagnosis not present

## 2021-11-17 NOTE — Progress Notes (Signed)
This patient returns to my office for at risk foot care.  This patient requires this care by a professional since this patient will be at risk due to having type 2 diabetes.   This patient is unable to cut nails himself since the patient cannot reach his nails.These nails are painful walking and wearing shoes.  This patient presents for at risk foot care today.  General Appearance  Alert, conversant and in no acute stress.  Vascular  Dorsalis pedis and posterior tibial  pulses are weakly  palpable  bilaterally.  Capillary return is within normal limits  bilaterally. Cold feet.  Bilaterally. Absent digital hair.  Neurologic  Senn-Weinstein monofilament wire test within normal limits  bilaterally. Muscle power within normal limits bilaterally.  Nails Thick disfigured discolored nails with subungual debris  from hallux to fifth toes bilaterally. No evidence of bacterial infection or drainage bilaterally.  Orthopedic  No limitations of motion  feet .  No crepitus or effusions noted.  No bony pathology or digital deformities noted.  HAV  B/L.  Bony prominence fifth metabase.    Skin  normotropic skin with no porokeratosis noted bilaterally.  No signs of infections or ulcers noted.     Onychomycosis  Pain in right toes  Pain in left toes  Consent was obtained for treatment procedures.   Mechanical debridement of nails 1-5  bilaterally performed with a nail nipper.  Filed with dremel without incident.    Return office visit   4 months                   Told patient to return for periodic foot care and evaluation due to potential at risk complications.   Gardiner Barefoot DPM

## 2021-12-09 NOTE — Progress Notes (Signed)
Name: Austin Hunt   MRN: [id number]    DOB: 06/29/1941   Date:12/10/2021       Progress Note  Subjective  Chief Complaint  Follow Up  HPI  DMII: Last A1C was high at 8.1 % he is back on 1000 mg of Metformin daily and also Actos 15 mg, glucose this past week has been good in the low 120's.He has associated neuropathy, dyslipidemia. He denies polyphagia, polydipsia or polyphagia. Eye exam is up to date, he will return in May for repeat labs.   Malnutrition: he has lost another 4 lbs since last visit. He used to be over 150 lbs, gradually losing weight, pants falling down, temporal waisting, states appetite is good . Discussed high calorie diet without adding sweets   Leukopenia: chronic : currently under the care of Dr. Tasia Catchings and reassuring labs and was released from her care in April 2022 . Unchanged    BPH and prostate cancer: he is seeing Urologist and denies any obstructive symptoms, except for nocturia about 1-  2 times per night.  Incidental finding of prostate cancer during TURP back in 2015 , last PSA was done June 2021 and it was 2.12 , he is now on yearly follow up with Dr. Caryl Pina  and last PSA down to 1.29 in July 2022. No other problems    History of HTN : patient is off medication, no chest pain or palpitation, bp is low today, advised to get up slowly    Dyslipidemia: he is not on statin therapy, myalgia in the past, he is aking Zetia now and last LDL but he refuses to try anything else. We have discussed PCSK9 and Vascepa in place of his otc fish oil    Chronic constipation: He has increased water intake , he drinks prune juice prn .Sometimes Miralax and stools softeners and is doing well, bowel movements daily now    CAD  He had a stress test done by Dr. Clayborn Bigness for evaluation of chest pain back in 11/2018 and results were normal . Last visit was 10/2021 He states symptoms resolved . He sates chest pain under control with medications, he had a stent placed 2004 and was having  decrease in exercise tolerance last year but feeling better now    May 9 th 2022  NORMAL LEFT VENTRICULAR SYSTOLIC FUNCTION  NORMAL RIGHT VENTRICULAR SYSTOLIC FUNCTION  MILD VALVULAR REGURGITATION (See above)  NO VALVULAR STENOSIS  ESTIMATED LVEF >55%      11/2018:  Normal myocardial perfusion scan no evidence of stress-induced  myocardial ischemia ejection fraction of 64% conclusion negative scan.   Low risk scan   Recurrent rash on groin: seen by Dr. 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 07/30/2016   Long term current use of systemic steroids 01/07/2016   Neutropenia ([place]) 01/07/2016   Anxiety 04/08/2015   Carotid artery narrowing 04/08/2015   Chronic constipation 04/08/2015   Brain syndrome, 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   Failure of erection 04/08/2015   H/O inguinal hernia repair 04/08/2015   HLD (hyperlipidemia) 04/08/2015   Adaptive colitis 04/08/2015   LBP (low back pain)  04/08/2015   MI (mitral incompetence) 04/08/2015   Drug intolerance 04/08/2015   Kidney lump 04/08/2015   TI (tricuspid incompetence) 04/08/2015   Unilateral recurrent femoral hernia without obstruction or gangrene 05/23/2014   Femoral hernia 05/23/2014   Right inguinal hernia 02/06/2014   Cataract 01/16/2014   Glaucoma 01/16/2014   Reflux esophagitis 06/21/2009   Coronary atherosclerosis 04/03/2009   Benign enlargement of prostate 02/11/2009   Decreased libido 01/12/2008   Benign essential HTN 09/12/2007    Past Surgical History:  Procedure Laterality Date   APPENDECTOMY  1954   BLADDER SURGERY  Oct 2015    CARDIAC CATHETERIZATION  2002   1 stent   COLONOSCOPY     EYE SURGERY  2011, 2013   cataracts   EYE SURGERY Right May 2016   glaucoma   HERNIA REPAIR Left 1967   HERNIA REPAIR Right 05-10-14   Right femoral hernia repair Dr Bary Castilla with PerFix plug, no onlay mesh.Marland Kitchen    PHOTOCOAGULATION WITH LASER Right 12/30/2015   Procedure: PHOTOCOAGULATION WITH LASER;  Surgeon: Ronnell Freshwater, MD;  Location: [facility];  Service: Ophthalmology;  Laterality: Right;  DIABETIC - oral meds IVA BLOCK   PROSTATE SURGERY  Oct 2015    Family History  Problem Relation Age of Onset   Diabetes Sister    Diabetes Brother    Diabetes Sister    Prostate cancer Neg Hx    Bladder Cancer Neg Hx     Social History   Tobacco Use   Smoking status: Never   Smokeless tobacco: Never   Tobacco comments:    smoking cessation materials not required  Substance Use Topics   Alcohol use: No     Current Outpatient Medications:    ACCU-CHEK GUIDE test strip, USE AS DIRECTED. 4 TIMES DAILY, Disp: 100 each, Rfl: 10   Accu-Chek Softclix Lancets lancets, TEST 4 TIMES DAILY, Disp: 100 each, Rfl: 5   aspirin 81 MG tablet, Take 81 mg by mouth daily. , Disp: , Rfl:    Blood Glucose Monitoring Suppl (ACCU-CHEK GUIDE ME) w/Device KIT, , Disp: , Rfl:    brimonidine-timolol (COMBIGAN) 0.2-0.5 % ophthalmic solution, Place 1 drop into both eyes every 12 (twelve) hours., Disp: , Rfl:    Cholecalciferol (VITAMIN D) 50 MCG (2000 UT) CAPS, Take 1 capsule (2,000 Units total) by mouth daily., Disp: 30 capsule, Rfl: 0   Cyanocobalamin (B-12) 1000 MCG SUBL, Place 1 tablet under the tongue daily., Disp: 30 tablet, Rfl: 0   docusate sodium (COLACE) 100 MG capsule, Take 100 mg by mouth daily., Disp: , Rfl:    dorzolamide (TRUSOPT) 2 % ophthalmic solution, 1 drop 2 (two) times daily. , Disp: , Rfl:    ezetimibe (ZETIA) 10 MG tablet, Take 1 tablet (10 mg total) by mouth daily., Disp: 90 tablet, Rfl: 1   gabapentin  (NEURONTIN) 100 MG capsule, Take 1-3 capsules (100-300 mg total) by mouth at bedtime. For itching on foot (Patient taking differently: Take 100-300 mg by mouth at bedtime. For itching on foot Open capsules and empty into applesauce to avoid gelatin), Disp: 90 capsule, Rfl: 0   Garlic 10 MG CAPS, Take 15 mg by mouth daily. , Disp: , Rfl:    ketoconazole (NIZORAL) 2 % cream, SMARTSIG:1 Application Topical 1 to 2 Times Daily, Disp: , Rfl:    latanoprost (XALATAN) 0.005 % ophthalmic solution, Place 1 drop into both eyes at bedtime. , Disp: , Rfl:    Magnesium 100 MG TABS, Take  by mouth. , Disp: , Rfl:    metFORMIN (GLUCOPHAGE-XR) 500 MG 24 hr tablet, Take 2 tablets (1,000 mg total) by mouth daily with breakfast., Disp: 180 tablet, Rfl: 0   Multiple Vitamin (MULTIVITAMIN) tablet, Take 1 tablet by mouth daily., Disp: , Rfl:    OMEGA-3 FATTY ACIDS PO, Take 250 mg by mouth daily. , Disp: , Rfl:    pioglitazone (ACTOS) 15 MG tablet, Take 1 tablet (15 mg total) by mouth daily., Disp: 90 tablet, Rfl: 1   polyethylene glycol powder (GLYCOLAX/MIRALAX) 17 GM/SCOOP powder, Take 17 g by mouth daily., Disp: 3350 g, Rfl: 1   sildenafil (REVATIO) 20 MG tablet, Take 1-5 tablets one hour prior to intercourse as needed., Disp: 30 tablet, Rfl: 6   triamcinolone (KENALOG) 0.025 % cream, Apply topically., Disp: , Rfl:    vitamin C (ASCORBIC ACID) 500 MG tablet, Take 500 mg by mouth 2 (two) times daily. Chewable, Disp: , Rfl:    Zinc Acetate 50 MG CAPS, Take 1 capsule by mouth daily. , Disp: , Rfl:   Allergies  Allergen Reactions   Colesevelam Other (See Comments)   Penicillins Itching and Other (See Comments)   Statins Other (See Comments)    myalgia   Welchol [Colesevelam Hcl]     I personally reviewed active problem list, medication list, allergies, family history, social history, health maintenance with the patient/caregiver today.   ROS  Ten systems reviewed and is negative except as mentioned in HPI    Objective  Vitals:   12/10/21 0916  BP: 110/62  Pulse: 85  Resp: 16  SpO2: 98%  Weight: 128 lb (58.1 kg)  Height: _0  (1.753 m)    Body mass index is 18.9 kg/m.  Physical Exam  Constitutional: Patient appears well-developed and  malnourished No distress. Temporal waisting  HEENT: head atraumatic, normocephalic, pupils equal and reactive to light, neck supple Cardiovascular: Normal rate, regular rhythm and normal heart sounds.  No murmur heard. No BLE edema. Pulmonary/Chest: Effort normal and breath sounds normal. No respiratory distress. Abdominal: Soft.  There is no tenderness. Psychiatric: Patient has a normal mood and affect. behavior is normal. Judgment and thought content normal.   Recent Results (from the past 2160 hour(s))  POCT HgB A1C     Status: Abnormal   Collection Time: 10/29/21  1:16 PM  Result Value Ref Range   Hemoglobin A1C 8.1 (A) 4.0 - 5.6 %   HbA1c POC (<> result, manual entry)     HbA1c, POC (prediabetic range)     HbA1c, POC (controlled diabetic range)     PHQ2/9: Depression screen [facility] 2/9 12/10/2021 10/29/2021 07/21/2021 06/03/2021 03/20/2021  Decreased Interest 0 0 0 0 0  Down, Depressed, Hopeless 0 0 0 0 0  PHQ - 2 Score 0 0 0 0 0  Altered sleeping 0 0 - - -  Tired, decreased energy 0 0 - - -  Change in appetite 0 0 - - -  Feeling bad or failure about yourself  0 0 - - -  Trouble concentrating 0 0 - - -  Moving slowly or fidgety/restless 0 0 - - -  Suicidal thoughts 0 0 - - -  PHQ-9 Score 0 0 - - -  Difficult doing work/chores - - - - -  Some recent data might be hidden    phq 9 is negative   Fall Risk: Fall Risk  12/10/2021 10/29/2021 07/21/2021 06/03/2021 03/20/2021  Falls in the past year? 0 1 0 0  0  Number falls in past yr: 0 0 0 0 0  Injury with Fall? 0 0 0 0 0  Risk for fall due to : No Fall Risks No Fall Risks - No Fall Risks -  Risk for fall due to: Comment - - - - -  Follow up Falls prevention discussed Falls prevention discussed  - Falls prevention discussed -      Functional Status Survey: Is the patient deaf or have difficulty hearing?: No Does the patient have difficulty seeing, even when wearing glasses/contacts?: No Does the patient have difficulty concentrating, remembering, or making decisions?: No Does the patient have difficulty walking or climbing stairs?: No Does the patient have difficulty dressing or bathing?: No    Assessment & Plan  1. Hyperlipidemia associated with type 2 diabetes mellitus (HCC)  - Lipid panel - Microalbumin / creatinine urine ratio - Hemoglobin A1c - icosapent Ethyl (VASCEPA) 1 g capsule; Take 2 capsules (2 g total) by mouth 2 (two) times daily.  Dispense: 120 capsule; Refill: 5  2. Mild protein-calorie malnutrition ([place])  - CBC with Differential/Platelet  3. 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  Dispense: 90 tablet; Refill: 1 - metFORMIN (GLUCOPHAGE-XR) 500 MG 24 hr tablet; Take 2 tablets (1,000 mg total) by mouth daily with breakfast.  Dispense: 180 tablet; Refill: 1  4. Atherosclerosis of native coronary artery of native heart without angina pectoris  stable  5. 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  Dispense: 90 tablet; Refill: 1  Had surgery   6. Other neutropenia ([place])  Seen by Dr. Tasia Catchings  7. Statin myopathy   8. Chronic idiopathic constipation   9. History of prostate cancer   10. Vitamin D deficiency   11. Dyslipidemia   12. B12 deficiency   13. Long-term use of high-risk medication  - COMPLETE METABOLIC PANEL WITH GFR

## 2021-12-10 ENCOUNTER — Ambulatory Visit (INDEPENDENT_AMBULATORY_CARE_PROVIDER_SITE_OTHER): Payer: Medicare Other | Admitting: Family Medicine

## 2021-12-10 ENCOUNTER — Encounter: Payer: Self-pay | Admitting: Family Medicine

## 2021-12-10 ENCOUNTER — Other Ambulatory Visit: Payer: Self-pay

## 2021-12-10 VITALS — BP 110/62 | HR 85 | Resp 16 | Ht 69.0 in | Wt 128.0 lb

## 2021-12-10 DIAGNOSIS — E1140 Type 2 diabetes mellitus with diabetic neuropathy, unspecified: Secondary | ICD-10-CM

## 2021-12-10 DIAGNOSIS — E1136 Type 2 diabetes mellitus with diabetic cataract: Secondary | ICD-10-CM

## 2021-12-10 DIAGNOSIS — N521 Erectile dysfunction due to diseases classified elsewhere: Secondary | ICD-10-CM

## 2021-12-10 DIAGNOSIS — Z8546 Personal history of malignant neoplasm of prostate: Secondary | ICD-10-CM | POA: Diagnosis not present

## 2021-12-10 DIAGNOSIS — E559 Vitamin D deficiency, unspecified: Secondary | ICD-10-CM

## 2021-12-10 DIAGNOSIS — E441 Mild protein-calorie malnutrition: Secondary | ICD-10-CM

## 2021-12-10 DIAGNOSIS — G720 Drug-induced myopathy: Secondary | ICD-10-CM | POA: Diagnosis not present

## 2021-12-10 DIAGNOSIS — E785 Hyperlipidemia, unspecified: Secondary | ICD-10-CM | POA: Diagnosis not present

## 2021-12-10 DIAGNOSIS — E1139 Type 2 diabetes mellitus with other diabetic ophthalmic complication: Secondary | ICD-10-CM

## 2021-12-10 DIAGNOSIS — I2510 Atherosclerotic heart disease of native coronary artery without angina pectoris: Secondary | ICD-10-CM | POA: Diagnosis not present

## 2021-12-10 DIAGNOSIS — T466X5A Adverse effect of antihyperlipidemic and antiarteriosclerotic drugs, initial encounter: Secondary | ICD-10-CM

## 2021-12-10 DIAGNOSIS — E1169 Type 2 diabetes mellitus with other specified complication: Secondary | ICD-10-CM

## 2021-12-10 DIAGNOSIS — K5904 Chronic idiopathic constipation: Secondary | ICD-10-CM | POA: Diagnosis not present

## 2021-12-10 DIAGNOSIS — E538 Deficiency of other specified B group vitamins: Secondary | ICD-10-CM | POA: Diagnosis not present

## 2021-12-10 DIAGNOSIS — Z79899 Other long term (current) drug therapy: Secondary | ICD-10-CM

## 2021-12-10 DIAGNOSIS — H42 Glaucoma in diseases classified elsewhere: Secondary | ICD-10-CM

## 2021-12-10 DIAGNOSIS — D708 Other neutropenia: Secondary | ICD-10-CM

## 2021-12-10 MED ORDER — ICOSAPENT ETHYL 1 G PO CAPS: 2.0000 g | ORAL_CAPSULE | Freq: Two times a day (BID) | ORAL | 5 refills | Status: DC

## 2021-12-10 MED ORDER — PIOGLITAZONE HCL 15 MG PO TABS: 15.0000 mg | ORAL_TABLET | Freq: Every day | ORAL | 1 refills | Status: DC

## 2021-12-10 MED ORDER — METFORMIN HCL ER 500 MG PO TB24: 1000.0000 mg | ORAL_TABLET | Freq: Every day | ORAL | 1 refills | Status: DC

## 2021-12-11 LAB — CBC WITH DIFFERENTIAL/PLATELET
Absolute Monocytes: 235 cells/uL (ref 200–950)
Basophils Absolute: 9 cells/uL (ref 0–200)
Basophils Relative: 0.4 %
Eosinophils Absolute: 101 cells/uL (ref 15–500)
Eosinophils Relative: 4.4 %
HCT: 43.7 % (ref 38.5–50.0)
Hemoglobin: 14.2 g/dL (ref 13.2–17.1)
Lymphs Abs: 929 cells/uL (ref 850–3900)
MCH: 29 pg (ref 27.0–33.0)
MCHC: 32.5 g/dL (ref 32.0–36.0)
MCV: 89.4 fL (ref 80.0–100.0)
MPV: 9.4 fL (ref 7.5–12.5)
Monocytes Relative: 10.2 %
Neutro Abs: 1026 cells/uL — ABNORMAL LOW (ref 1500–7800)
Neutrophils Relative %: 44.6 %
Platelets: 231 10*3/uL (ref 140–400)
RBC: 4.89 10*6/uL (ref 4.20–5.80)
RDW: 11.8 % (ref 11.0–15.0)
Total Lymphocyte: 40.4 %
WBC: 2.3 10*3/uL — ABNORMAL LOW (ref 3.8–10.8)

## 2021-12-11 LAB — COMPLETE METABOLIC PANEL WITH GFR
AG Ratio: 1.6 (calc) (ref 1.0–2.5)
ALT: 12 U/L (ref 9–46)
AST: 20 U/L (ref 10–35)
Albumin: 4.2 g/dL (ref 3.6–5.1)
Alkaline phosphatase (APISO): 67 U/L (ref 35–144)
BUN: 16 mg/dL (ref 7–25)
CO2: 33 mmol/L — ABNORMAL HIGH (ref 20–32)
Calcium: 9.7 mg/dL (ref 8.6–10.3)
Chloride: 101 mmol/L (ref 98–110)
Creat: 0.84 mg/dL (ref 0.70–1.22)
Globulin: 2.6 g/dL (calc) (ref 1.9–3.7)
Glucose, Bld: 137 mg/dL — ABNORMAL HIGH (ref 65–99)
Potassium: 4.4 mmol/L (ref 3.5–5.3)
Sodium: 139 mmol/L (ref 135–146)
Total Bilirubin: 0.5 mg/dL (ref 0.2–1.2)
Total Protein: 6.8 g/dL (ref 6.1–8.1)
eGFR: 87 mL/min/{1.73_m2} (ref >=60)

## 2021-12-11 LAB — LIPID PANEL
Cholesterol: 196 mg/dL (ref <200)
HDL: 62 mg/dL (ref >=40)
LDL Cholesterol (Calc): 120 mg/dL (calc) — ABNORMAL HIGH
Non-HDL Cholesterol (Calc): 134 mg/dL (calc) — ABNORMAL HIGH (ref <130)
Total CHOL/HDL Ratio: 3.2 (calc) (ref <5.0)
Triglycerides: 58 mg/dL (ref <150)

## 2021-12-11 LAB — HEMOGLOBIN A1C
Hgb A1c MFr Bld: 7.8 % of total Hgb — ABNORMAL HIGH (ref <5.7)
Mean Plasma Glucose: 177 mg/dL
eAG (mmol/L): 9.8 mmol/L

## 2021-12-11 LAB — MICROALBUMIN / CREATININE URINE RATIO
Creatinine, Urine: 63 mg/dL (ref 20–320)
Microalb Creat Ratio: 3 mcg/mg creat (ref <30)
Microalb, Ur: 0.2 mg/dL

## 2021-12-12 ENCOUNTER — Telehealth: Payer: Self-pay

## 2021-12-12 ENCOUNTER — Other Ambulatory Visit: Payer: Self-pay | Admitting: Family Medicine

## 2021-12-12 DIAGNOSIS — D708 Other neutropenia: Secondary | ICD-10-CM

## 2021-12-12 NOTE — Progress Notes (Signed)
Chronic Care Management Pharmacy Assistant   Name: MERICK KELLEHER  MRN: [id number] DOB: 07-Jan-1941  Reason for Encounter: Diabetes Disease State Call   Recent office visits:  12/10/2021 [name], MD (PCP Office Visit) for Follow-up- Started: Lcosapent Ethyl 2 g twice daily, Stopped: Omega 3 Gatty Acids due to change in therapy, lab orders placed, no follow-up noted  10/29/2021 [name], MD (PCP Office Visit) for Type II DM- Changed: Metformin 500 mg daily increased to 1000 mg daily, lab order placed, patient to follow-up in 6 weeks  07/21/2021 [name], MD (PCP Office Visit) for Follow-up- Started: Polyethylene Glycol 3350 17 g daily, Stopped: Tadalafil 20 mg due to patient not taking, lab order placed, No return appointment noted  Recent consult visits:  11/17/2021 Gardiner Barefoot, DPM (Podiatry) for Follow-up- No medication changes noted, no orders placed, patient to return in 4 months.  09/23/2021 Dwayne Aida Raider, MD (Cardiology) for Follow-up- No medication changes noted, no orders placed, patient to follow-up in 6 months  07/17/2021 Gardiner Barefoot, DPM (Podiatry) for Nail Problem- No medication changes noted, No orders placed, Patient instructed to return in 4 months  Hospital visits:  None in previous 6 months  Medications: Outpatient Encounter Medications as of 12/12/2021  Medication Sig Note   ACCU-CHEK GUIDE test strip USE AS DIRECTED. 4 TIMES DAILY    Accu-Chek Softclix Lancets lancets TEST 4 TIMES DAILY    aspirin 81 MG tablet Take 81 mg by mouth daily.     Blood Glucose Monitoring Suppl (ACCU-CHEK GUIDE ME) w/Device KIT     brimonidine-timolol (COMBIGAN) 0.2-0.5 % ophthalmic solution Place 1 drop into both eyes every 12 (twelve) hours.    Cholecalciferol (VITAMIN D) 50 MCG (2000 UT) CAPS Take 1 capsule (2,000 Units total) by mouth daily.    Cyanocobalamin (B-12) 1000 MCG SUBL Place 1 tablet under the tongue daily.    docusate sodium (COLACE) 100  MG capsule Take 100 mg by mouth daily.    dorzolamide (TRUSOPT) 2 % ophthalmic solution 1 drop 2 (two) times daily.     ezetimibe (ZETIA) 10 MG tablet Take 1 tablet (10 mg total) by mouth daily.    gabapentin (NEURONTIN) 100 MG capsule Take 1-3 capsules (100-300 mg total) by mouth at bedtime. For itching on foot (Patient taking differently: Take 100-300 mg by mouth at bedtime. For itching on foot Open capsules and empty into applesauce to avoid gelatin)    Garlic 10 MG CAPS Take 15 mg by mouth daily.     icosapent Ethyl (VASCEPA) 1 g capsule Take 2 capsules (2 g total) by mouth 2 (two) times daily.    ketoconazole (NIZORAL) 2 % cream SMARTSIG:1 Application Topical 1 to 2 Times Daily 06/03/2021: PRN only   latanoprost (XALATAN) 0.005 % ophthalmic solution Place 1 drop into both eyes at bedtime.     Magnesium 100 MG TABS Take by mouth.     metFORMIN (GLUCOPHAGE-XR) 500 MG 24 hr tablet Take 2 tablets (1,000 mg total) by mouth daily with breakfast.    Multiple Vitamin (MULTIVITAMIN) tablet Take 1 tablet by mouth daily.    pioglitazone (ACTOS) 15 MG tablet Take 1 tablet (15 mg total) by mouth daily.    polyethylene glycol powder (GLYCOLAX/MIRALAX) 17 GM/SCOOP powder Take 17 g by mouth daily.    sildenafil (REVATIO) 20 MG tablet Take 1-5 tablets one hour prior to intercourse as needed.    triamcinolone (KENALOG) 0.025 % cream Apply topically.    vitamin C (ASCORBIC  ACID) 500 MG tablet Take 500 mg by mouth 2 (two) times daily. Chewable    Zinc Acetate 50 MG CAPS Take 1 capsule by mouth daily.     No facility-administered encounter medications on file as of 12/12/2021.   Care Gaps: PNA Vaccine  Star Rating Drugs: Pioglitazone 15 mg last filled on 11/30/2021 for a 30-Day supply with [name] Metformin 500 mg last filled on 12/09/2021 for a 30-Day supply with [name]  Recent Relevant Labs: Lab Results  Component Value Date/Time   HGBA1C 7.8 (H) 12/10/2021 10:04 AM   HGBA1C 8.1 (A)  10/29/2021 01:16 PM   HGBA1C 7.4 (A) 07/22/2021 02:50 PM   HGBA1C 7.1 (H) 11/05/2020 09:57 AM   HGBA1C 7.2 10/19/2018 02:11 PM   HGBA1C 7.2 (A) 10/19/2018 02:11 PM   HGBA1C 7.2 (A) 10/19/2018 02:11 PM   MICROALBUR 0.2 12/10/2021 10:04 AM   MICROALBUR 0.4 06/21/2020 09:10 AM   MICROALBUR 20 01/04/2017 10:45 AM    Kidney Function Lab Results  Component Value Date/Time   CREATININE 0.84 12/10/2021 10:04 AM   CREATININE 0.84 02/24/2021 12:01 PM   CREATININE 0.83 11/05/2020 09:57 AM   GFRNONAA >60 02/24/2021 12:01 PM   GFRNONAA 83 11/05/2020 09:57 AM   GFRAA 96 11/05/2020 09:57 AM    Current antihyperglycemic regimen:  Metformin 500 mg two tablets daily Pioglitazone 15 mg 1 tablet daily  What recent interventions/DTPs have been made to improve glycemic control:  Patient's Metformin was increased to 1000 mg daily  Have there been any recent hospitalizations or ED visits since last visit with CPP? No  Patient denies hypoglycemic symptoms, including Pale, Sweaty, Shaky, Hungry, Nervous/irritable, and Vision changes  Patient denies hyperglycemic symptoms, including blurry vision, excessive thirst, fatigue, polyuria, and weakness  How often are you checking your blood sugar? once daily What are your blood sugars ranging?  Fasting: today it was 120 and it ranges now after the increase of Metformin 110-127  During the week, how often does your blood glucose drop below 70? Never Are you checking your feet daily/regularly? Yes patient is seeing Podiatry  Adherence Review: Is the patient currently on a STATIN medication? No Is the patient currently on ACE/ARB medication? No Does the patient have >5 day gap between last estimated fill dates? No  Patient reports that today he is doing well. Patient denies any ill symptoms at this time. Patient stated that since the increase of his Metformin his blood sugar numbers have gone back to normal he is no longer experiencing the highs. Patient  is talking all medications as directed and denies needing refills on anything today. Patient provided with my direct number if he needs anything. Patient has no further concerns or issues today.  Patient has a scheduled telephone appointment with [name], CPP on 01/07/2022 @ 0830 Patient has a scheduled AWV on 06/04/2022 @ [place], [facility] Pharmacist Assistant Phone: [phone]

## 2021-12-18 DIAGNOSIS — R070 Pain in throat: Secondary | ICD-10-CM | POA: Diagnosis not present

## 2021-12-18 DIAGNOSIS — R221 Localized swelling, mass and lump, neck: Secondary | ICD-10-CM | POA: Diagnosis not present

## 2021-12-19 ENCOUNTER — Other Ambulatory Visit: Payer: Self-pay | Admitting: Unknown Physician Specialty

## 2021-12-19 ENCOUNTER — Other Ambulatory Visit: Payer: Self-pay | Admitting: Family Medicine

## 2021-12-19 DIAGNOSIS — E785 Hyperlipidemia, unspecified: Secondary | ICD-10-CM

## 2021-12-19 DIAGNOSIS — R221 Localized swelling, mass and lump, neck: Secondary | ICD-10-CM

## 2021-12-19 DIAGNOSIS — R070 Pain in throat: Secondary | ICD-10-CM

## 2021-12-23 DIAGNOSIS — H401133 Primary open-angle glaucoma, bilateral, severe stage: Secondary | ICD-10-CM | POA: Diagnosis not present

## 2021-12-24 ENCOUNTER — Other Ambulatory Visit: Payer: Self-pay | Admitting: Unknown Physician Specialty

## 2021-12-24 DIAGNOSIS — R070 Pain in throat: Secondary | ICD-10-CM

## 2021-12-24 DIAGNOSIS — R221 Localized swelling, mass and lump, neck: Secondary | ICD-10-CM

## 2021-12-30 ENCOUNTER — Ambulatory Visit
Admission: RE | Admit: 2021-12-30 | Discharge: 2021-12-30 | Disposition: A | Discharge status: Home / Self Care | Payer: Medicare Other | Source: Ambulatory Visit | Attending: Unknown Physician Specialty | Admitting: Unknown Physician Specialty

## 2021-12-30 DIAGNOSIS — R070 Pain in throat: Secondary | ICD-10-CM

## 2021-12-30 DIAGNOSIS — M542 Cervicalgia: Secondary | ICD-10-CM | POA: Diagnosis not present

## 2021-12-30 DIAGNOSIS — R221 Localized swelling, mass and lump, neck: Secondary | ICD-10-CM

## 2022-01-06 ENCOUNTER — Telehealth: Payer: Self-pay

## 2022-01-06 NOTE — Progress Notes (Signed)
? ? ?  Chronic Care Management ?Pharmacy Assistant  ? ?Name: Austin Hunt  MRN: [id number] DOB: 1941-01-05 ? ?Patient called to be reminded of his telephone appointment with [name], CPP on 01/07/2022 @ 0830 ? ?Patient aware of appointment date, time, and type of appointment (either telephone or in person). Patient aware to have/bring all medications, supplements, blood pressure and/or blood sugar logs to visit. ? ? ?Lynann Bologna, CPA/CMA ?Clinical Pharmacist Assistant ?Phone: [phone]  ? ?

## 2022-01-07 ENCOUNTER — Ambulatory Visit (INDEPENDENT_AMBULATORY_CARE_PROVIDER_SITE_OTHER): Payer: Medicare Other

## 2022-01-07 ENCOUNTER — Telehealth: Payer: Self-pay

## 2022-01-07 DIAGNOSIS — H42 Glaucoma in diseases classified elsewhere: Secondary | ICD-10-CM

## 2022-01-07 DIAGNOSIS — E1136 Type 2 diabetes mellitus with diabetic cataract: Secondary | ICD-10-CM

## 2022-01-07 DIAGNOSIS — E785 Hyperlipidemia, unspecified: Secondary | ICD-10-CM

## 2022-01-07 NOTE — Progress Notes (Signed)
Chronic Care Management Pharmacy Note  01/07/2022 Name:  Austin Hunt MRN:  [id number] DOB:  1941/01/19  Summary: Patient presents for CCM follow-up. Tolerating vascepa well. Fasting blood sugars well controlled. Patient endorses continued itching in feet.  Recommendations/Changes made from today's visit: Continue current medications  Plan: CPP follow-up 6 months  Subjective: Austin Hunt is an 81 y.o. year old male who is a primary patient of [name], MD.  The CCM team was consulted for assistance with disease management and care coordination needs.    Engaged with patient by telephone for follow up visit in response to provider referral for pharmacy case management and/or care coordination services.   Consent to Services:  The patient was given information about Chronic Care Management services, agreed to services, and gave verbal consent prior to initiation of services.  Please see initial visit note for detailed documentation.   Patient Care Team: [name], MD as PCP - General (Family Medicine) Yolonda Kida, MD as Consulting Physician (Cardiology) Hollice Espy, MD as Consulting Physician (Urology) Leandrew Koyanagi, MD as Consulting Physician (Ophthalmology) Gardiner Barefoot, DPM as Consulting Physician (Podiatry) Germaine Pomfret, [facility] (Pharmacist) Earlie Server, MD as Consulting Physician (Oncology) Ree Edman, MD (Dermatology)  Recent office visits: 12/10/21: Patient presented to [name] for follow-up. Vascepa started.  10/29/21: Patient presented to [name] for follow-up. Metformin 2 tablets with breakfast.  Recent consult visits: 09/23/21: Patient presented to Dr. Clayborn Bigness (Cardiology)  Hospital visits: None in previous 6 months  Objective:  Lab Results  Component Value Date   CREATININE 0.84 12/10/2021   BUN 16 12/10/2021   GFRNONAA >60 02/24/2021   GFRAA 96 11/05/2020   NA 139 12/10/2021   K 4.4 12/10/2021   CALCIUM  9.7 12/10/2021   CO2 33 (H) 12/10/2021    Lab Results  Component Value Date/Time   HGBA1C 7.8 (H) 12/10/2021 10:04 AM   HGBA1C 8.1 (A) 10/29/2021 01:16 PM   HGBA1C 7.4 (A) 07/22/2021 02:50 PM   HGBA1C 7.1 (H) 11/05/2020 09:57 AM   HGBA1C 7.2 10/19/2018 02:11 PM   HGBA1C 7.2 (A) 10/19/2018 02:11 PM   HGBA1C 7.2 (A) 10/19/2018 02:11 PM   MICROALBUR 0.2 12/10/2021 10:04 AM   MICROALBUR 0.4 06/21/2020 09:10 AM   MICROALBUR 20 01/04/2017 10:45 AM    Last diabetic Eye exam:  Lab Results  Component Value Date/Time   HMDIABEYEEXA No Retinopathy 08/19/2021 12:00 AM    Last diabetic Foot exam:  Lab Results  Component Value Date/Time   HMDIABFOOTEX Abnormal 04/25/2018 12:00 AM   HMDIABFOOTEX Abnormal 04/25/2018 12:00 AM     Lab Results  Component Value Date   CHOL 196 12/10/2021   HDL 62 12/10/2021   LDLCALC 120 (H) 12/10/2021   TRIG 58 12/10/2021   CHOLHDL 3.2 12/10/2021    Hepatic Function Latest Ref Rng & Units 12/10/2021 02/24/2021 11/05/2020  Total Protein 6.1 - 8.1 g/dL 6.8 7.3 7.3  Albumin 3.5 - 5.0 g/dL - 4.1 -  AST 10 - 35 U/L 20 25 20   ALT 9 - 46 U/L 12 16 11   Alk Phosphatase 38 - 126 U/L - 58 -  Total Bilirubin 0.2 - 1.2 mg/dL 0.5 0.7 0.6    Lab Results  Component Value Date/Time   TSH 2.856 11/12/2020 11:01 AM   TSH 3.08 12/15/2019 10:35 AM   TSH 1.96 09/11/2019 12:00 AM    CBC Latest Ref Rng & Units 12/10/2021 02/24/2021 11/12/2020  WBC 3.8 - 10.8 Thousand/uL 2.3(L)  3.4(L) 3.3(L)  Hemoglobin 13.2 - 17.1 g/dL 14.2 14.5 15.3  Hematocrit 38.5 - 50.0 % 43.7 44.9 47.3  Platelets 140 - 400 Thousand/uL 231 201 227    Lab Results  Component Value Date/Time   VD25OH 61 12/15/2019 10:35 AM   VD25OH 31 05/24/2018 10:54 AM    Clinical ASCVD: Yes  The ASCVD Risk score (Arnett DK, et al., 2019) failed to calculate for the following reasons:   The 2019 ASCVD risk score is only valid for ages 47 to 90    Depression screen PHQ 2/9 12/10/2021 10/29/2021 07/21/2021   Decreased Interest 0 0 0  Down, Depressed, Hopeless 0 0 0  PHQ - 2 Score 0 0 0  Altered sleeping 0 0 -  Tired, decreased energy 0 0 -  Change in appetite 0 0 -  Feeling bad or failure about yourself  0 0 -  Trouble concentrating 0 0 -  Moving slowly or fidgety/restless 0 0 -  Suicidal thoughts 0 0 -  PHQ-9 Score 0 0 -  Difficult doing work/chores - - -  Some recent data might be hidden    Social History   Tobacco Use  Smoking Status Never  Smokeless Tobacco Never  Tobacco Comments   smoking cessation materials not required   BP Readings from Last 3 Encounters:  12/10/21 110/62  10/29/21 120/68  05/09/21 134/62   Pulse Readings from Last 3 Encounters:  12/10/21 85  10/29/21 (!) 54  05/09/21 (!) 105   Wt Readings from Last 3 Encounters:  12/10/21 128 lb (58.1 kg)  10/29/21 132 lb (59.9 kg)  07/21/21 124 lb (56.2 kg)    Assessment/Interventions: Review of patient past medical history, allergies, medications, health status, including review of consultants reports, laboratory and other test data, was performed as part of comprehensive evaluation and provision of chronic care management services.   SDOH:  (Social Determinants of Health) assessments and interventions performed: Yes   CCM Care Plan  Allergies  Allergen Reactions   Colesevelam Other (See Comments)   Penicillins Itching and Other (See Comments)   Statins Other (See Comments)    myalgia   Welchol [Colesevelam Hcl]     Medications Reviewed Today     Reviewed by [name], MD (Physician) on 12/10/21 at [phone]  Med List Status: <None>   Medication Order Taking? Sig Documenting Provider Last Dose Status Informant  ACCU-CHEK GUIDE test strip [id number] Yes USE AS DIRECTED. 4 TIMES DAILY [name], MD Taking Active   Accu-Chek Softclix Lancets lancets [id number] Yes TEST 4 TIMES DAILY [name], MD Taking Active   aspirin 81 MG tablet [id number] Yes Take 81 mg by mouth daily.  [provider] Taking Active Self  Blood Glucose Monitoring Suppl (ACCU-CHEK GUIDE ME) w/Device KIT [id number] Yes  [provider] Taking Active   brimonidine-timolol (COMBIGAN) 0.2-0.5 % ophthalmic solution [id number] Yes Place 1 drop into both eyes every 12 (twelve) hours. [provider] Taking Active Self  Cholecalciferol (VITAMIN D) 50 MCG (2000 UT) CAPS [id number] Yes Take 1 capsule (2,000 Units total) by mouth daily. [name], MD Taking Active   Cyanocobalamin (B-12) 1000 MCG SUBL [id number] Yes Place 1 tablet under the tongue daily. [name], MD Taking Active   docusate sodium (COLACE) 100 MG capsule [id number] Yes Take 100 mg by mouth daily. [provider] Taking Active   dorzolamide (TRUSOPT) 2 % ophthalmic solution [id number] Yes 1 drop 2 (two) times daily.  [provider] Taking Active  Self           Med Note Despina Hidden   Fri Apr 26, 2020  9:38 AM)    ezetimibe (ZETIA) 10 MG tablet [id number] Yes Take 1 tablet (10 mg total) by mouth daily. [name], MD Taking Active   gabapentin (NEURONTIN) 100 MG capsule [id number] Yes Take 1-3 capsules (100-300 mg total) by mouth at bedtime. For itching on foot  Patient taking differently: Take 100-300 mg by mouth at bedtime. For itching on foot Open capsules and empty into applesauce to avoid gelatin   [name], MD Taking Active   Garlic 10 MG CAPS [id number] Yes Take 15 mg by mouth daily.  [provider] Taking Active Self           Med Note Tamala Julian, Valentino Nose   Fri Apr 26, 2020  9:38 AM)    ketoconazole (NIZORAL) 2 % cream [id number] Yes SMARTSIG:1 Application Topical 1 to 2 Times Daily [provider] Taking Active            Med Note Clemetine Marker D   Tue Jun 03, 2021  9:31 AM) PRN only  latanoprost (XALATAN) 0.005 % ophthalmic solution [id number] Yes Place 1 drop into both eyes at bedtime.  [provider] Taking Active Self  Magnesium 100 MG TABS  [id number] Yes Take by mouth.  [provider] Taking Active   metFORMIN (GLUCOPHAGE-XR) 500 MG 24 hr tablet [id number] Yes Take 2 tablets (1,000 mg total) by mouth daily with breakfast. [name], MD Taking Active   Multiple Vitamin (MULTIVITAMIN) tablet [id number] Yes Take 1 tablet by mouth daily. [provider] Taking Active Self  Discontinued 12/10/21 [phone] (Change in therapy)          Med Note Despina Hidden   Fri Apr 26, 2020  9:38 AM)    pioglitazone (ACTOS) 15 MG tablet [id number] Yes Take 1 tablet (15 mg total) by mouth daily. [name], MD Taking Active   polyethylene glycol powder (GLYCOLAX/MIRALAX) 17 GM/SCOOP powder [id number] Yes Take 17 g by mouth daily. [name], MD Taking Active   sildenafil (REVATIO) 20 MG tablet [id number] Yes Take 1-5 tablets one hour prior to intercourse as needed. Hollice Espy, MD Taking Active   triamcinolone (KENALOG) 0.025 % cream [id number] Yes Apply topically. [provider] Taking Active   vitamin C (ASCORBIC ACID) 500 MG tablet [id number] Yes Take 500 mg by mouth 2 (two) times daily. Chewable [provider] Taking Active Self  Zinc Acetate 50 MG CAPS [id number] Yes Take 1 capsule by mouth daily.  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   Failure of erection 04/08/2015   H/O inguinal hernia repair 04/08/2015   HLD (hyperlipidemia)  04/08/2015   LBP (low back pain) 04/08/2015   MI (mitral incompetence) 04/08/2015   Kidney lump 04/08/2015   TI (tricuspid incompetence) 04/08/2015   Unilateral recurrent femoral hernia  without obstruction or gangrene 05/23/2014   Femoral hernia 05/23/2014   Right inguinal hernia 02/06/2014   Cataract 01/16/2014   Glaucoma 01/16/2014   Reflux esophagitis 06/21/2009   Coronary atherosclerosis 04/03/2009   Benign enlargement of prostate 02/11/2009   Decreased libido 01/12/2008   Benign essential HTN 09/12/2007    Immunization History  Administered Date(s) Administered   Fluad Quad(high Dose 65+) 08/01/2019, 08/19/2020, 07/22/2021   Influenza Split 07/02/2010   Influenza, High Dose Seasonal PF 08/14/2015, 07/30/2016, 08/19/2017, 08/09/2018   Influenza, Seasonal, Injecte, Preservative Fre 07/08/2011, 07/22/2012   Influenza,inj,Quad PF,6+ Mos 07/11/2013   PFIZER(Purple Top)SARS-COV-2 Vaccination 11/22/2019, 12/16/2019, 08/04/2020, 01/30/2021, 07/14/2021   Pneumococcal Conjugate-13 01/22/2014   Pneumococcal-Unspecified 01/03/2008   Tdap 11/04/2010, 11/05/2020   Zoster Recombinat (Shingrix) 06/25/2020, 10/03/2020   Zoster, Live 06/14/2008    Conditions to be addressed/monitored:  Hypertension, Hyperlipidemia, Diabetes and Coronary Artery Disease  There are no care plans that you recently modified to display for this patient.    Medication Assistance: None required.  Patient affirms current coverage meets needs.  Patient's preferred pharmacy is:  [facility], [place]. [place] [address] Phone: [phone] Fax: [facility] [phone] [place]ina [place], [place] [address] [address] Phone: [phone] Fax: [phone]  Uses pill box? Yes Pt endorses 100% compliance  We discussed: Current pharmacy is preferred with insurance plan and patient is satisfied with pharmacy  services Patient decided to: Continue current medication management strategy  Care Plan and Follow Up Patient Decision:  Patient agrees to Care Plan and Follow-up.  Plan: Telephone follow up appointment with care management team member scheduled for:  07/08/2022 at 8:30 AM  [facility] [phone]  Current Barriers:  No barriers noted  Pharmacist Clinical Goal(s):  Over the next 90 days, patient will maintain control of diabetes as evidenced by A1c less than 8%  through collaboration with PharmD and provider.   Interventions: 1:1 collaboration with [name], MD regarding development and update of comprehensive plan of care as evidenced by provider attestation and co-signature Inter-disciplinary care team collaboration (see longitudinal plan of care) Comprehensive medication review performed; medication list updated in electronic medical record  Hypertension (BP goal <140/90) -Controlled -Current treatment: None -Medications previously tried: Ramipril, Imdur, Metoprolol,   -Current home readings: NA -Current dietary habits: Patient trying to eat healthy, minimizing salt intake, watching sweets and fatty foods. Patient has had significant unexplained weight loss over past 3 years, stable recently. -Current exercise habits: exercise at gym 3-4 times weekly (treadmill, bike for 40-45 minutes)  -Denies hypotensive/hypertensive symptoms -Educated on Daily salt intake goal < 2300 mg; Exercise goal of 150 minutes per week; -Counseled to monitor BP at home weekly, document, and provide log at future appointments -Recommended to continue current medication  Hyperlipidemia: (LDL goal < 70) -Uncontrolled -Current treatment: Ezetimibe 10 mg daily: Appropriate, Effective, Safe, Accessible Vascepa 1 g 2 caps twice daily: Appropriate, Effective, Safe, Accessible  -Medications previously tried: "statins" colesevelam   -Recommended  to continue current medication  Diabetes (A1c goal <8%) -Controlled -Current medications: Metformin XR 500 mg 2 tablets daily: Appropriate, Effective, Safe, Accessible   Pioglitazone 15 mg daily: Appropriate, Effective, Safe, Accessible  -Medications previously tried: glipizide  -Current home glucose readings fasting glucose: 113, 98, 101, 144  post prandial glucose: NA -Denies hypoglycemic/hyperglycemic symptoms -Recommended to continue current medication  Patient Goals/Self-Care Activities Over  the next 90 days, patient will:  - check glucose daily before breakfast, document, and provide at future appointments check blood pressure weekly, document, and provide at future appointments  Follow Up Plan: Telephone follow up appointment with care management team member scheduled for:  07/08/2022 at 8:30 AM

## 2022-01-07 NOTE — Patient Instructions (Signed)
Visit Information ?It was great speaking with you today!  Please let me know if you have any questions about our visit. ? ? Goals Addressed   ? ?  ?  ?  ?  ? This Visit's Progress  ?  Monitor and Manage My Blood Sugar-Diabetes Type 2   On track  ?  Timeframe:  Long-Range Goal ?Priority:  High ?Start Date:    01/02/21                         ?Expected End Date:  01/08/23                    ? ?Follow up within 90 days ?  ?-check blood sugar daily before breakfast ?- check blood sugar if I feel it is too high or too low ?- enter blood sugar readings and medication or insulin into daily log  ?  ?Why is this important?   ?Checking your blood sugar at home helps to keep it from getting very high or very low.  ?Writing the results in a diary or log helps the doctor know how to care for you.  ?Your blood sugar log should have the time, date and the results.  ?Also, write down the amount of insulin or other medicine that you take.  ?Other information, like what you ate, exercise done and how you were feeling, will also be helpful.   ?  ?Notes:  ?  ? ?  ? ? ?Patient Care Plan: General Pharmacy (Adult)  ?  ? ?Problem Identified: Hypertension, Hyperlipidemia, Diabetes and Coronary Artery Disease   ?Priority: High  ?  ? ?Long-Range Goal: Patient-Specific Goal   ?Start Date: 01/02/2021  ?Expected End Date: 01/07/2022  ?This Visit's Progress: On track  ?Priority: High  ?Note:   ?Current Barriers:  ?No barriers noted ? ?Pharmacist Clinical Goal(s):  ?Over the next 90 days, patient will maintain control of diabetes as evidenced by A1c less than 8%  through collaboration with PharmD and provider.  ? ?Interventions: ?1:1 collaboration with [name], MD regarding development and update of comprehensive plan of care as evidenced by provider attestation and co-signature ?Inter-disciplinary care team collaboration (see longitudinal plan of care) ?Comprehensive medication review performed; medication list updated in electronic medical  record ? ?Hypertension (BP goal <140/90) ?-Controlled ?-Current treatment: ?None ?-Medications previously tried: Ramipril, Imdur, Metoprolol,   ?-Current home readings: NA ?-Current dietary habits: Patient trying to eat healthy, minimizing salt intake, watching sweets and fatty foods. Patient has had significant unexplained weight loss over past 3 years, stable recently. ?-Current exercise habits: exercise at gym 3-4 times weekly (treadmill, bike for 40-45 minutes)  ?-Denies hypotensive/hypertensive symptoms ?-Educated on Daily salt intake goal < 2300 mg; ?Exercise goal of 150 minutes per week; ?-Counseled to monitor BP at home weekly, document, and provide log at future appointments ?-Recommended to continue current medication ? ?Hyperlipidemia: (LDL goal < 70) ?-Uncontrolled ?-Current treatment: ?Ezetimibe 10 mg daily  ?-Medications previously tried: "statins" colesevelam   ?-Educated on Importance of limiting foods high in cholesterol;  ?-Patient interested if he might be able to stop ezetimibe at some point. Discussed the risks vs benefits of cholesterol therapy in secondary ASCVD prevention. Counseled patient to reach out to Cardiology for final discussion on if he can come off cholesterol therapy.  ?-Recommended to continue current medication ? ?Diabetes (A1c goal <8%) ?-Controlled ?-Current medications: ?Metformin XR 500 mg 2 tablets AM, 1 tablet PM  ?-  Medications previously tried: NA  ?-Current home glucose readings ?fasting glucose: 114, 98-120 ?post prandial glucose: NA ?-Denies hypoglycemic/hyperglycemic symptoms ?-Educated on A1c and blood sugar goals; ?-Counseled to check feet daily and get yearly eye exams ?-Recommended to continue current medication ? ? ?Patient Goals/Self-Care Activities ?Over the next 90 days, patient will:  ?- check glucose daily before breakfast, document, and provide at future appointments ?check blood pressure weekly, document, and provide at future appointments ? ?Follow Up  Plan: Telephone follow up appointment with care management team member scheduled for: 01/07/2022 at 9:00 AM  ?  ? ? ? ?Patient agreed to services and verbal consent obtained.  ? ?The patient verbalized understanding of instructions, educational materials, and care plan provided today and declined offer to receive copy of patient instructions, educational materials, and care plan.  ? ?Doristine Section, BCACP, CPP ?Clinical Pharmacist Practitioner  ?[facility] ?[phone] ?  ?

## 2022-01-15 ENCOUNTER — Telehealth: Payer: Self-pay

## 2022-01-15 DIAGNOSIS — D708 Other neutropenia: Secondary | ICD-10-CM | POA: Diagnosis not present

## 2022-01-15 LAB — CBC WITH DIFFERENTIAL/PLATELET
Absolute Monocytes: 394 cells/uL (ref 200–950)
Basophils Absolute: 20 cells/uL (ref 0–200)
Basophils Relative: 0.6 %
Eosinophils Absolute: 133 cells/uL (ref 15–500)
Eosinophils Relative: 3.9 %
HCT: 43.7 % (ref 38.5–50.0)
Hemoglobin: 14.8 g/dL (ref 13.2–17.1)
Lymphs Abs: 1448 cells/uL (ref 850–3900)
MCH: 30.2 pg (ref 27.0–33.0)
MCHC: 33.9 g/dL (ref 32.0–36.0)
MCV: 89.2 fL (ref 80.0–100.0)
MPV: 9.3 fL (ref 7.5–12.5)
Monocytes Relative: 11.6 %
Neutro Abs: 1404 cells/uL — ABNORMAL LOW (ref 1500–7800)
Neutrophils Relative %: 41.3 %
Platelets: 216 10*3/uL (ref 140–400)
RBC: 4.9 10*6/uL (ref 4.20–5.80)
RDW: 11.8 % (ref 11.0–15.0)
Total Lymphocyte: 42.6 %
WBC: 3.4 10*3/uL — ABNORMAL LOW (ref 3.8–10.8)

## 2022-01-15 NOTE — Telephone Encounter (Signed)
Patient stated that he was given Omega 3 vitamin by [name] and wants to know if he needs to continue to take it.

## 2022-01-16 NOTE — Telephone Encounter (Signed)
Pt aware to still take vitamin ?

## 2022-01-26 NOTE — Progress Notes (Incomplete)
01/26/22 ?8:34 AM  ? ?Austin Hunt ?01/13/1941 ?[id number] ? ?Referring provider:  ?[name], MD ?[facility] ?[address] ?[place],  [place] [address] ?No chief complaint on file. ? ? ?Urological history: ?Prostate cancer  ?- Gleason 3+3 identified on TURP specimen in 2015. No longer on dutasteride. PSA 0.7 on dutasteride.  ?- PSA 1/29 on 05/06/2021 ? 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 ? ? ?HPI: ?Austin Hunt is a 81 y.o.male who presents today for further evaluation of prostate pain.  ? ? ? ? ? ?PMH: ?Past Medical History:  ?Diagnosis Date  ? Arthritis   ? neck - no limitations  ? Balanitis   ? BPH (benign prostatic hypertrophy) with urinary obstruction   ? 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 Diabetes mellitus without complication ([place])   ? Glaucoma   ? Headache   ? rare - 1x/mo  ? Nocturia   ? Prostate cancer ([place])   ? ? ?Surgical History: ?Past Surgical History:  ?Procedure Laterality Date  ? APPENDECTOMY  1954  ? BLADDER SURGERY  Oct 2015  ? CARDIAC CATHETERIZATION  2002  ? 1 stent  ? COLONOSCOPY    ? EYE SURGERY  2011, 2013  ? cataracts  ? EYE SURGERY Right May 2016  ? glaucoma  ? HERNIA REPAIR Left 1967  ? HERNIA REPAIR Right 05-10-14  ? Right femoral hernia repair Dr Bary Castilla with PerFix plug, no onlay mesh..   ? PHOTOCOAGULATION WITH LASER Right 12/30/2015  ? Procedure: PHOTOCOAGULATION WITH LASER;  Surgeon: Ronnell Freshwater, MD;  Location: [place];  Service: Ophthalmology;  Laterality: Right;  DIABETIC - oral meds ?IVA BLOCK  ? PROSTATE SURGERY  Oct 2015  ? ? ?Home Medications:  ?Allergies as of 01/27/2022   ? ?   Reactions  ? Colesevelam Other (See Comments)  ? Penicillins Itching, Other (See Comments)  ? Statins Other (See Comments)  ? myalgia  ? Welchol [colesevelam Hcl]   ? ?  ? ?  ?Medication List  ?  ? ?  ? Accurate as of January 26, 2022  8:34 AM. If you have any questions, ask your nurse or doctor.  ?  ?  ? ?   ? ?Accu-Chek Guide Me w/Device Kit ?  ?Accu-Chek Guide test strip ?Generic drug: glucose blood ?USE AS DIRECTED. 4 TIMES DAILY ?  ?Accu-Chek Softclix Lancets lancets ?TEST 4 TIMES DAILY ?  ?aspirin 81 MG tablet ?Take 81 mg by mouth daily. ?  ?B-12 1000 MCG Subl ?Place 1 tablet under the tongue daily. ?  ?brimonidine-timolol 0.2-0.5 % ophthalmic solution ?Commonly known as: COMBIGAN ?Place 1 drop into both eyes every 12 (twelve) hours. ?  ?docusate sodium 100 MG capsule ?Commonly known as: COLACE ?Take 100 mg by mouth daily. ?  ?dorzolamide 2 % ophthalmic solution ?Commonly known as: TRUSOPT ?1 drop 2 (two) times daily. ?  ?ezetimibe 10 MG tablet ?Commonly known as: ZETIA ?TAKE 1 TABLET BY MOUTH ONCE DAILY ?  ?gabapentin 100 MG capsule ?Commonly known as: NEURONTIN ?Take 1-3 capsules (100-300 mg total) by mouth at bedtime. For itching on foot ?What changed: additional instructions ?  ?Garlic 10 MG Caps ?Take 15 mg by mouth daily. ?  ?icosapent Ethyl 1 g capsule ?Commonly known as: Vascepa ?Take 2 capsules (2 g total) by mouth 2 (two) times daily. ?  ?ketoconazole 2 % cream ?Commonly known as: NIZORAL ?SMARTSIG:1 Application Topical 1 to 2 Times Daily ?  ?  latanoprost 0.005 % ophthalmic solution ?Commonly known as: XALATAN ?Place 1 drop into both eyes at bedtime. ?  ?Magnesium 100 MG Tabs ?Take by mouth. ?  ?metFORMIN 500 MG 24 hr tablet ?Commonly known as: GLUCOPHAGE-XR ?Take 2 tablets (1,000 mg total) by mouth daily with breakfast. ?  ?multivitamin tablet ?Take 1 tablet by mouth daily. ?  ?pioglitazone 15 MG tablet ?Commonly known as: Actos ?Take 1 tablet (15 mg total) by mouth daily. ?  ?polyethylene glycol powder 17 GM/SCOOP powder ?Commonly known as: GLYCOLAX/MIRALAX ?Take 17 g by mouth daily. ?  ?sildenafil 20 MG tablet ?Commonly known as: REVATIO ?Take 1-5 tablets one hour prior to intercourse as needed. ?  ?triamcinolone 0.025 % cream ?Commonly known as: KENALOG ?Apply topically. ?  ?vitamin C 500 MG  tablet ?Commonly known as: ASCORBIC ACID ?Take 500 mg by mouth 2 (two) times daily. Chewable ?  ?Vitamin D 50 MCG (2000 UT) Caps ?Take 1 capsule (2,000 Units total) by mouth daily. ?  ?Zinc Acetate 50 MG Caps ?Take 1 capsule by mouth daily. ?  ? ?  ? ? ?Allergies:  ?Allergies  ?Allergen Reactions  ? Colesevelam Other (See Comments)  ? Penicillins Itching and Other (See Comments)  ? Statins Other (See Comments)  ?  myalgia  ? Welchol [Colesevelam Hcl]   ? ? ?Family History: ?Family History  ?Problem Relation Age of Onset  ? Diabetes Sister   ? Diabetes Brother   ? Diabetes Sister   ? Prostate cancer Neg Hx   ? Bladder Cancer Neg Hx   ? ? ?Social History:  reports that he has never smoked. He has never used smokeless tobacco. He reports that he does not drink alcohol and does not use drugs. ? ? ?Physical Exam: ?There were no vitals taken for this visit.  ?Constitutional:  Alert and oriented, No acute distress. ?HEENT: [place] AT, moist mucus membranes.  Trachea midline, no masses. ?Cardiovascular: No clubbing, cyanosis, or edema. ?Respiratory: Normal respiratory effort, no increased work of breathing. ?GI: Abdomen is soft, nontender, nondistended, no abdominal masses ?GU: No CVA tenderness ?Lymph: No cervical or inguinal lymphadenopathy. ?Skin: No rashes, bruises or suspicious lesions. ?Neurologic: Grossly intact, no focal deficits, moving all 4 extremities. ?Psychiatric: Normal mood and affect. ? ?Laboratory Data: ?Lab Results  ?Component Value Date  ? CREATININE 0.84 12/10/2021  ? ?Lab Results  ?Component Value Date  ? HGBA1C 7.8 (H) 12/10/2021  ? ?Component ?    Latest Ref Rng 12/10/2021  ?Glucose ?    65 - 99 mg/dL 137 (H)   ?BUN ?    7 - 25 mg/dL 16   ?Creatinine ?    0.70 - 1.22 mg/dL 0.84   ?BUN/Creatinine Ratio ?    6 - 22 (calc) NOT APPLICABLE   ?Sodium ?    135 - 146 mmol/L 139   ?Potassium ?    3.5 - 5.3 mmol/L 4.4   ?Chloride ?    98 - 110 mmol/L 101   ?CO2 ?    20 - 32 mmol/L 33 (H)   ?Calcium ?    8.6 - 10.3  mg/dL 9.7   ?Total Protein ?    6.1 - 8.1 g/dL 6.8   ?Albumin MSPROF ?    3.6 - 5.1 g/dL 4.2   ?Globulin ?    1.9 - 3.7 g/dL (calc) 2.6   ?AG Ratio ?    1.0 - 2.5 (calc) 1.6   ?Total Bilirubin ?    0.2 - 1.2  mg/dL 0.5   ?Alkaline phosphatase (APISO) ?    35 - 144 U/L 67   ?AST ?    10 - 35 U/L 20   ?ALT ?    9 - 46 U/L 12   ?eGFR ?    > OR = 60 mL/min/1.53m 87   ?  ?(H) High ? ?Urinalysis ? ? ?Pertinent Imaging: ? ? ?Assessment & Plan:   ? ? ?No follow-ups on file. ? ?[facility]?[address] ?[place] [address]?[phone]? ?I,Kailey Littlejohn,acting as a sEducation administratorfor [employer] PA-C.,have documented all relevant documentation on the behalf of [place] PA-C,as directed by  [facility] PA-C while in the presence of SR[place] PA-C. ? ?

## 2022-01-27 ENCOUNTER — Ambulatory Visit: Payer: Medicare Other | Admitting: Urology

## 2022-01-29 ENCOUNTER — Telehealth: Payer: Self-pay | Admitting: Family Medicine

## 2022-01-29 NOTE — Telephone Encounter (Signed)
Pt spoke with [facility] and they advised that they will send test strips info from them  ?[facility] [place] [facility] [place] [facility] Phone:  [phone]  ?Fax:  [phone]  ?  ?So that provider can send rx for test strips to [name] / please call pt for better info  ?

## 2022-01-30 ENCOUNTER — Other Ambulatory Visit: Payer: Self-pay

## 2022-01-30 DIAGNOSIS — I10 Essential (primary) hypertension: Secondary | ICD-10-CM | POA: Diagnosis not present

## 2022-01-30 DIAGNOSIS — E1140 Type 2 diabetes mellitus with diabetic neuropathy, unspecified: Secondary | ICD-10-CM

## 2022-01-30 DIAGNOSIS — E1159 Type 2 diabetes mellitus with other circulatory complications: Secondary | ICD-10-CM

## 2022-01-30 DIAGNOSIS — Z7984 Long term (current) use of oral hypoglycemic drugs: Secondary | ICD-10-CM | POA: Diagnosis not present

## 2022-01-30 DIAGNOSIS — E785 Hyperlipidemia, unspecified: Secondary | ICD-10-CM

## 2022-01-30 DIAGNOSIS — E1136 Type 2 diabetes mellitus with diabetic cataract: Secondary | ICD-10-CM

## 2022-01-30 MED ORDER — ACCU-CHEK GUIDE VI STRP: ORAL_STRIP | 10 refills | Status: DC

## 2022-01-30 NOTE — Telephone Encounter (Signed)
RX for test strips sent to [name] ?

## 2022-02-02 ENCOUNTER — Other Ambulatory Visit: Payer: Self-pay

## 2022-02-02 DIAGNOSIS — E1136 Type 2 diabetes mellitus with diabetic cataract: Secondary | ICD-10-CM

## 2022-02-02 DIAGNOSIS — E1140 Type 2 diabetes mellitus with diabetic neuropathy, unspecified: Secondary | ICD-10-CM

## 2022-02-02 MED ORDER — ACCU-CHEK SOFTCLIX LANCETS MISC: 5 refills | Status: DC

## 2022-02-02 MED ORDER — ACCU-CHEK GUIDE VI STRP: ORAL_STRIP | 10 refills | Status: DC

## 2022-02-04 ENCOUNTER — Telehealth: Payer: Self-pay

## 2022-02-04 NOTE — Telephone Encounter (Signed)
Copied from [place] [id number]. Topic: Quick Communication - Rx Refill/Question >> Feb 04, 2022 12:49 PM Pawlus, Brayton Layman A wrote: [facility] get [facility] on [facility] for 2 recent prescriptions, each have different instructions and caller needed to know which was correct.  glucose blood (ACCU-CHEK GUIDE) test strip [facility] Softclix Lancets lancets  Reference number [id number] >> Feb 04, 2022 12:52 PM Pawlus, Brayton Layman A wrote: One says to use twice daily and the other states use 4 times daily when these 2 Rxs are supposed to be used together [facility]

## 2022-02-05 NOTE — Telephone Encounter (Signed)
Called and updated. 

## 2022-02-11 NOTE — Progress Notes (Signed)
02/20/22 ?11:55 AM  ? ?Austin Hunt ?1941-01-16 ?[id number] ? ?Referring provider:  ?[name], MD ?[facility] ?[address] ?[place],  [place] [address] ? ?Chief Complaint  ?Patient presents with  ? Prostate pain  ? ?Urological history: ?Prostate cancer  ?-PSA 1.29 ?-Gleason 3+3 identified on TURP specimen in 2015 ? 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 ? ?HPI: ?Austin Hunt is a 81 y.o.male who presents today for further evaluation of prostate pain.  ? ?He has been having a sensation of his penis "feeling odd" off and on for the last two months.   He describes the sensation as it feels like it is going to hurt.  He denies any penile swelling.  He does not have erections.   He notices a slowing of the stream when the penis feels odd.  ? ?He drinks a lot of lemon juice to help prevent stones.  ? ?Hbg A1c 7.8 in 12/2021 ? ?UA benign ? ?PVR 92 mL ? ?He states the Cialis 20 mg did not work at all for erections.  He states he only took 20 mg of the sildenafil as he did not realize he could take up to 5 tablets. ? ?PMH: ?Past Medical History:  ?Diagnosis Date  ? Arthritis   ? neck - no limitations  ? Balanitis   ? BPH (benign prostatic hypertrophy) with urinary obstruction   ? 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 Diabetes mellitus without complication ([place])   ? Glaucoma   ? Headache   ? rare - 1x/mo  ? Nocturia   ? Prostate cancer ([place])   ? ? ?Surgical History: ?Past Surgical History:  ?Procedure Laterality Date  ? APPENDECTOMY  1954  ? BLADDER SURGERY  Oct 2015  ? CARDIAC CATHETERIZATION  2002  ? 1 stent  ? COLONOSCOPY    ? EYE SURGERY  2011, 2013  ? cataracts  ? EYE SURGERY Right May 2016  ? glaucoma  ? HERNIA REPAIR Left 1967  ? HERNIA REPAIR Right 05-10-14  ? Right femoral hernia repair Dr Bary Castilla with PerFix plug,  no onlay mesh..   ? PHOTOCOAGULATION WITH LASER Right 12/30/2015  ? Procedure: PHOTOCOAGULATION WITH LASER;  Surgeon: Ronnell Freshwater, MD;  Location: [facility];  Service: Ophthalmology;  Laterality: Right;  DIABETIC - oral meds ?IVA BLOCK  ? PROSTATE SURGERY  Oct 2015  ? ? ?Home Medications:  ?Allergies as of 02/12/2022   ? ?   Reactions  ? Colesevelam Other (See Comments)  ? Penicillins Itching, Other (See Comments)  ? Statins Other (See Comments)  ? myalgia  ? Welchol [colesevelam Hcl]   ? ?  ? ?  ?Medication List  ?  ? ?  ? Accurate as of February 12, 2022 11:59 PM. If you have any questions, ask your nurse or doctor.  ?  ?  ? ?  ? ?Accu-Chek Guide Me w/Device Kit ?  ?Accu-Chek Guide test strip ?Generic drug: glucose blood ?USE AS DIRECTED. 4 TIMES DAILY ?  ?Accu-Chek Softclix Lancets lancets ?Use 1 lancet twice (2) daily to check blood glucose ?  ?aspirin 81 MG tablet ?Take 81 mg by mouth daily. ?  ?B-12 1000 MCG Subl ?Place 1 tablet under the tongue daily. ?  ?brimonidine-timolol 0.2-0.5 % ophthalmic solution ?Commonly known as: COMBIGAN ?Place 1 drop into both eyes every 12 (  twelve) hours. ?  ?docusate sodium 100 MG capsule ?Commonly known as: COLACE ?Take 100 mg by mouth daily. ?  ?dorzolamide 2 % ophthalmic solution ?Commonly known as: TRUSOPT ?1 drop 2 (two) times daily. ?  ?ezetimibe 10 MG tablet ?Commonly known as: ZETIA ?TAKE 1 TABLET BY MOUTH ONCE DAILY ?  ?Fish Oil 500 MG Caps ?  ?gabapentin 100 MG capsule ?Commonly known as: NEURONTIN ?Take 1-3 capsules (100-300 mg total) by mouth at bedtime. For itching on foot ?What changed: additional instructions ?  ?Garlic 10 MG Caps ?Take 15 mg by mouth daily. ?  ?icosapent Ethyl 1 g capsule ?Commonly known as: Vascepa ?Take 2 capsules (2 g total) by mouth 2 (two) times daily. ?  ?ketoconazole 2 % cream ?Commonly known as: NIZORAL ?SMARTSIG:1 Application Topical 1 to 2 Times Daily ?  ?latanoprost 0.005 % ophthalmic solution ?Commonly known as:  XALATAN ?Place 1 drop into both eyes at bedtime. ?  ?Magnesium 100 MG Tabs ?Take by mouth. ?  ?metFORMIN 500 MG 24 hr tablet ?Commonly known as: GLUCOPHAGE-XR ?Take 2 tablets (1,000 mg total) by mouth daily with breakfast. ?  ?multivitamin tablet ?Take 1 tablet by mouth daily. ?  ?pioglitazone 15 MG tablet ?Commonly known as: Actos ?Take 1 tablet (15 mg total) by mouth daily. ?  ?polyethylene glycol powder 17 GM/SCOOP powder ?Commonly known as: GLYCOLAX/MIRALAX ?Take 17 g by mouth daily. ?  ?sildenafil 20 MG tablet ?Commonly known as: REVATIO ?Take 1-5 tablets one hour prior to intercourse as needed. ?  ?triamcinolone 0.025 % cream ?Commonly known as: KENALOG ?Apply topically. ?  ?vitamin C 500 MG tablet ?Commonly known as: ASCORBIC ACID ?Take 500 mg by mouth 2 (two) times daily. Chewable ?  ?Vitamin D 50 MCG (2000 UT) Caps ?Take 1 capsule (2,000 Units total) by mouth daily. ?  ?Zinc Acetate 50 MG Caps ?Take 1 capsule by mouth daily. ?  ? ?  ? ? ?Allergies:  ?Allergies  ?Allergen Reactions  ? Colesevelam Other (See Comments)  ? Penicillins Itching and Other (See Comments)  ? Statins Other (See Comments)  ?  myalgia  ? Welchol [Colesevelam Hcl]   ? ? ?Family History: ?Family History  ?Problem Relation Age of Onset  ? Diabetes Sister   ? Diabetes Brother   ? Diabetes Sister   ? Prostate cancer Neg Hx   ? Bladder Cancer Neg Hx   ? ? ?Social History:  reports that he has never smoked. He has never used smokeless tobacco. He reports that he does not drink alcohol and does not use drugs. ? ? ?Physical Exam: ?BP (!) 160/56   Pulse 65   Ht 5' 9"  (1.753 m)   Wt 126 lb (57.2 kg)   BMI 18.61 kg/m?   ?Constitutional:  Well nourished. Alert and oriented, No acute distress. ?HEENT: [place] AT, mask in place.  Trachea midline ?Cardiovascular: No clubbing, cyanosis, or edema. ?Respiratory: Normal respiratory effort, no increased work of breathing. ?Neurologic: Grossly intact, no focal deficits, moving all 4  extremities. ?Psychiatric: Normal mood and affect.  ? ?Laboratory Data: ?Lab Results  ?Component Value Date  ? CREATININE 0.84 12/10/2021  ? ?Lab Results  ?Component Value Date  ? HGBA1C 7.8 (H) 12/10/2021  ? ?Component ?    Latest Ref Rng 12/10/2021  ?Glucose ?    65 - 99 mg/dL 137 (H)   ?BUN ?    7 - 25 mg/dL 16   ?Creatinine ?    0.70 - 1.22 mg/dL 0.84   ?  BUN/Creatinine Ratio ?    6 - 22 (calc) NOT APPLICABLE   ?Sodium ?    135 - 146 mmol/L 139   ?Potassium ?    3.5 - 5.3 mmol/L 4.4   ?Chloride ?    98 - 110 mmol/L 101   ?CO2 ?    20 - 32 mmol/L 33 (H)   ?Calcium ?    8.6 - 10.3 mg/dL 9.7   ?Total Protein ?    6.1 - 8.1 g/dL 6.8   ?Albumin MSPROF ?    3.6 - 5.1 g/dL 4.2   ?Globulin ?    1.9 - 3.7 g/dL (calc) 2.6   ?AG Ratio ?    1.0 - 2.5 (calc) 1.6   ?Total Bilirubin ?    0.2 - 1.2 mg/dL 0.5   ?Alkaline phosphatase (APISO) ?    35 - 144 U/L 67   ?AST ?    10 - 35 U/L 20   ?ALT ?    9 - 46 U/L 12   ?eGFR ?    > OR = 60 mL/min/1.74m 87   ?  ? ?  Latest Ref Rng & Units 01/15/2022  ?  4:06 PM 12/10/2021  ? 10:04 AM 02/24/2021  ? 12:01 PM  ?CBC  ?WBC 3.8 - 10.8 Thousand/uL 3.4   2.3   3.4    ?Hemoglobin 13.2 - 17.1 g/dL 14.8   14.2   14.5    ?Hematocrit 38.5 - 50.0 % 43.7   43.7   44.9    ?Platelets 140 - 400 Thousand/uL 216   231   201    ? ?Urinalysis ?Component ?    Latest Ref Rng 02/12/2022  ?Color, UA ?    Yellow  Yellow   ?Bilirubin, UA ?    Negative  Negative   ?Ketones, UA ?    Negative  Negative   ?Specific Gravity, UA ?    1.005 - 1.030  1.020   ?RBC, UA ?    Negative  Negative   ?pH, UA ?    5.0 - 7.5  7.0   ?Protein,UA ?    Negative/Trace  Negative   ?Nitrite, UA ?    Negative  Negative   ?Leukocytes,UA ?    Negative  Negative   ?Appearance Ur ?    Clear  Clear   ?Glucose, UA ?    Negative  Negative   ?Urobilinogen, Ur ?    0.2 - 1.0 mg/dL 1.0   ?Microscopic Examination See below:   ? ?Component ?    Latest Ref Rng 02/12/2022  ?RBC ?    0 - 2 /hpf 0-2   ?WBC, UA ?    0 - 5 /hpf 0-5   ?Epithelial Cells (non  renal) ?    0 - 10 /hpf 0-10   ?Bacteria, UA ?    None seen/Few  None seen   ?I have reviewed the labs.  ? ?Pertinent Imaging: ? 02/12/22 09:54  ?Scan Result 970m ? ? ?Assessment & Plan:   ? ?1. Prostate pain/penile pain ?-It was actually

## 2022-02-12 ENCOUNTER — Encounter: Payer: Self-pay | Admitting: Urology

## 2022-02-12 ENCOUNTER — Ambulatory Visit (INDEPENDENT_AMBULATORY_CARE_PROVIDER_SITE_OTHER): Payer: Medicare Other | Admitting: Urology

## 2022-02-12 VITALS — BP 160/56 | HR 65 | Ht 69.0 in | Wt 126.0 lb

## 2022-02-12 DIAGNOSIS — N4889 Other specified disorders of penis: Secondary | ICD-10-CM

## 2022-02-12 DIAGNOSIS — N5203 Combined arterial insufficiency and corporo-venous occlusive erectile dysfunction: Secondary | ICD-10-CM | POA: Diagnosis not present

## 2022-02-12 DIAGNOSIS — N4281 Prostatodynia syndrome: Secondary | ICD-10-CM

## 2022-02-12 DIAGNOSIS — C61 Malignant neoplasm of prostate: Secondary | ICD-10-CM

## 2022-02-12 DIAGNOSIS — I2510 Atherosclerotic heart disease of native coronary artery without angina pectoris: Secondary | ICD-10-CM

## 2022-02-12 LAB — BLADDER SCAN AMB NON-IMAGING

## 2022-02-12 LAB — URINALYSIS, COMPLETE
Bilirubin, UA: NEGATIVE
Glucose, UA: NEGATIVE
Ketones, UA: NEGATIVE
Leukocytes,UA: NEGATIVE
Nitrite, UA: NEGATIVE
Protein,UA: NEGATIVE
RBC, UA: NEGATIVE
Specific Gravity, UA: 1.02 (ref 1.005–1.030)
Urobilinogen, Ur: 1 mg/dL (ref 0.2–1.0)
pH, UA: 7 (ref 5.0–7.5)

## 2022-02-12 LAB — MICROSCOPIC EXAMINATION: Bacteria, UA: NONE SEEN

## 2022-02-12 MED ORDER — SILDENAFIL CITRATE 20 MG PO TABS: ORAL_TABLET | ORAL | 6 refills | Status: DC

## 2022-02-12 NOTE — Patient Instructions (Signed)
Cialis (tadalafil) 20 mg, highest dose ? ?Viagra (sildenafil) 20 mg, can take up to five tablets  ?

## 2022-02-13 LAB — PSA: Prostate Specific Ag, Serum: 1.5 ng/mL (ref 0.0–4.0)

## 2022-02-16 ENCOUNTER — Telehealth: Payer: Self-pay | Admitting: *Deleted

## 2022-02-16 LAB — CULTURE, URINE COMPREHENSIVE

## 2022-02-16 NOTE — Telephone Encounter (Signed)
-----   Message from Nori Riis, PA-C sent at 02/13/2022  9:38 AM EDT ----- ?Please let Mr. Weathers know that his PSA is stable at 1.5 and keep his follow-up with Dr. Erlene Quan in July.  ?

## 2022-02-16 NOTE — Telephone Encounter (Signed)
Notified patient as instructed, patient pleased. Discussed follow-up appointments, patient agrees  

## 2022-02-19 ENCOUNTER — Telehealth: Payer: Self-pay

## 2022-02-19 NOTE — Progress Notes (Signed)
? ? ?Chronic Care Management ?Pharmacy Assistant  ? ?Name: Austin Hunt  MRN: [id number] DOB: July 07, 1941 ? ?Reason for Encounter: Diabetes Disease State Call ?  ?Recent office visits:  ?None ID ? ?Recent consult visits:  ?02/12/2022 Zara Council, PA-C (Urology) for Prostate Pain- No medication changes noted, lab orders placed, Bladder scan order placed, Patient to follow-up in 3 months ? ?Hospital visits:  ?None in previous 6 months ? ?Medications: ?Outpatient Encounter Medications as of 02/19/2022  ?Medication Sig Note  ? Accu-Chek Softclix Lancets lancets Use 1 lancet twice (2) daily to check blood glucose   ? aspirin 81 MG tablet Take 81 mg by mouth daily.    ? Blood Glucose Monitoring Suppl (ACCU-CHEK GUIDE ME) w/Device KIT    ? brimonidine-timolol (COMBIGAN) 0.2-0.5 % ophthalmic solution Place 1 drop into both eyes every 12 (twelve) hours.   ? Cholecalciferol (VITAMIN D) 50 MCG (2000 UT) CAPS Take 1 capsule (2,000 Units total) by mouth daily.   ? Cyanocobalamin (B-12) 1000 MCG SUBL Place 1 tablet under the tongue daily.   ? docusate sodium (COLACE) 100 MG capsule Take 100 mg by mouth daily.   ? dorzolamide (TRUSOPT) 2 % ophthalmic solution 1 drop 2 (two) times daily.    ? ezetimibe (ZETIA) 10 MG tablet TAKE 1 TABLET BY MOUTH ONCE DAILY   ? gabapentin (NEURONTIN) 100 MG capsule Take 1-3 capsules (100-300 mg total) by mouth at bedtime. For itching on foot (Patient taking differently: Take 100-300 mg by mouth at bedtime. For itching on foot ?Open capsules and empty into applesauce to avoid gelatin)   ? Garlic 10 MG CAPS Take 15 mg by mouth daily.    ? glucose blood (ACCU-CHEK GUIDE) test strip USE AS DIRECTED. 4 TIMES DAILY   ? icosapent Ethyl (VASCEPA) 1 g capsule Take 2 capsules (2 g total) by mouth 2 (two) times daily.   ? ketoconazole (NIZORAL) 2 % cream SMARTSIG:1 Application Topical 1 to 2 Times Daily 06/03/2021: PRN only  ? latanoprost (XALATAN) 0.005 % ophthalmic solution Place 1 drop into both eyes at  bedtime.    ? Magnesium 100 MG TABS Take by mouth.    ? metFORMIN (GLUCOPHAGE-XR) 500 MG 24 hr tablet Take 2 tablets (1,000 mg total) by mouth daily with breakfast.   ? Multiple Vitamin (MULTIVITAMIN) tablet Take 1 tablet by mouth daily.   ? Omega-3 Fatty Acids (FISH OIL) 500 MG CAPS    ? pioglitazone (ACTOS) 15 MG tablet Take 1 tablet (15 mg total) by mouth daily.   ? polyethylene glycol powder (GLYCOLAX/MIRALAX) 17 GM/SCOOP powder Take 17 g by mouth daily.   ? sildenafil (REVATIO) 20 MG tablet Take 1-5 tablets one hour prior to intercourse as needed.   ? triamcinolone (KENALOG) 0.025 % cream Apply topically.   ? vitamin C (ASCORBIC ACID) 500 MG tablet Take 500 mg by mouth 2 (two) times daily. Chewable   ? Zinc Acetate 50 MG CAPS Take 1 capsule by mouth daily.    ? ?No facility-administered encounter medications on file as of 02/19/2022.  ? ?Care Gaps: ?PNA Vaccine ? ?Star Rating Drugs: ?Pioglitazone 15 mg last filled on 01/30/2022 for a 30-Day supply with [name] ?Metformin 500 mg last filled on 01/15/2022 for a 90-Day supply with [name] ? ?Recent Relevant Labs: ?Lab Results  ?Component Value Date/Time  ? HGBA1C 7.8 (H) 12/10/2021 10:04 AM  ? HGBA1C 8.1 (A) 10/29/2021 01:16 PM  ? HGBA1C 7.4 (A) 07/22/2021 02:50 PM  ? HGBA1C  7.1 (H) 11/05/2020 09:57 AM  ? HGBA1C 7.2 10/19/2018 02:11 PM  ? HGBA1C 7.2 (A) 10/19/2018 02:11 PM  ? HGBA1C 7.2 (A) 10/19/2018 02:11 PM  ? MICROALBUR 0.2 12/10/2021 10:04 AM  ? MICROALBUR 0.4 06/21/2020 09:10 AM  ? MICROALBUR 20 01/04/2017 10:45 AM  ?  ?Kidney Function ?Lab Results  ?Component Value Date/Time  ? CREATININE 0.84 12/10/2021 10:04 AM  ? CREATININE 0.84 02/24/2021 12:01 PM  ? CREATININE 0.83 11/05/2020 09:57 AM  ? GFRNONAA >60 02/24/2021 12:01 PM  ? GFRNONAA 83 11/05/2020 09:57 AM  ? GFRAA 96 11/05/2020 09:57 AM  ? ?Current antihyperglycemic regimen:  ?Metformin XR 500 mg 2 tablets daily   ?Pioglitazone 15 mg daily ? ?What recent interventions/DTPs have been made to  improve glycemic control:  ?None ID ? ?Have there been any recent hospitalizations or ED visits since last visit with CPP? No ? ?Patient denies hypoglycemic symptoms, including Pale, Sweaty, Shaky, Hungry, Nervous/irritable, and Vision changes ?Patient denies hyperglycemic symptoms, including blurry vision, excessive thirst, fatigue, polyuria, and weakness ? ?How often are you checking your blood sugar? in the morning before eating or drinking ? ?What are your blood sugars ranging?  ?Fasting: 112-144 ? ?During the week, how often does your blood glucose drop below 70?  Patient stated nothing low recently ?Are you checking your feet daily/regularly? Yes ? ?Adherence Review: ?Is the patient currently on a STATIN medication? No ?Is the patient currently on ACE/ARB medication? No ?Does the patient have >5 day gap between last estimated fill dates? No ? ?I spoke to the patient, and he reports that he is doing well. Patient denies any ill symptoms at this time. Patient stated his blood pressure was a little elevated a few days ago when he saw his Urologist. He reports that his blood pressure normally elevates during provider visits. He reports when he got home he checked his blood pressure and it was back to normal. Patient is taking all medications as directed. Patient has no concerns or issues at this time. ? ? ?Patient has a follow-up telephone appointment with [name], CPP on 07/08/2022 @ 0830 ?Patient scheduled for his AWV on 06/04/2022 @ 0920. ? ?Lynann Bologna, CPA/CMA ?Clinical Pharmacist Assistant ?Phone: [phone]  ?

## 2022-03-19 ENCOUNTER — Ambulatory Visit (INDEPENDENT_AMBULATORY_CARE_PROVIDER_SITE_OTHER): Payer: Medicare Other | Admitting: Podiatry

## 2022-03-19 ENCOUNTER — Encounter: Payer: Self-pay | Admitting: Podiatry

## 2022-03-19 DIAGNOSIS — E119 Type 2 diabetes mellitus without complications: Secondary | ICD-10-CM | POA: Diagnosis not present

## 2022-03-19 DIAGNOSIS — L309 Dermatitis, unspecified: Secondary | ICD-10-CM | POA: Insufficient documentation

## 2022-03-19 DIAGNOSIS — M79676 Pain in unspecified toe(s): Secondary | ICD-10-CM

## 2022-03-19 DIAGNOSIS — B351 Tinea unguium: Secondary | ICD-10-CM

## 2022-03-19 DIAGNOSIS — M2010 Hallux valgus (acquired), unspecified foot: Secondary | ICD-10-CM

## 2022-03-19 MED ORDER — CLOTRIMAZOLE-BETAMETHASONE 1-0.05 % EX CREA: 1.0000 "application " | TOPICAL_CREAM | Freq: Every day | CUTANEOUS | 0 refills | Status: AC

## 2022-03-19 NOTE — Addendum Note (Signed)
Addended by: Lolita Rieger on: 03/19/2022 10:49 AM   Modules accepted: Orders

## 2022-03-19 NOTE — Progress Notes (Signed)
This patient returns to my office for at risk foot care.  This patient requires this care by a professional since this patient will be at risk due to having type 2 diabetes.   This patient is unable to cut nails himself since the patient cannot reach his nails.These nails are painful walking and wearing shoes.  This patient presents for at risk foot care today.  Patient is also have severe nighttime itching.  General Appearance  Alert, conversant and in no acute stress.  Vascular  Dorsalis pedis and posterior tibial  pulses are weakly  palpable  bilaterally.  Capillary return is within normal limits  bilaterally. Cold feet.  Bilaterally. Absent digital hair.  Neurologic  Senn-Weinstein monofilament wire test within normal limits  bilaterally. Muscle power within normal limits bilaterally.  Nails Thick disfigured discolored nails with subungual debris  from hallux to fifth toes bilaterally. No evidence of bacterial infection or drainage bilaterally.  Orthopedic  No limitations of motion  feet .  No crepitus or effusions noted.  No bony pathology or digital deformities noted.  HAV  B/L.  Bony prominence fifth metabase.    Skin  normotropic skin with no porokeratosis noted bilaterally.  No signs of infections or ulcers noted.   Peeling and itching feet  B/L.  Onychomycosis  Pain in right toes  Pain in left toes  Dermatitis  Consent was obtained for treatment procedures.   Mechanical debridement of nails 1-5  bilaterally performed with a nail nipper.  Filed with dremel without incident.  Prescribe lotrisone. Due to skin peeling I am prescribing the medicine.  Must consider diabetic neuropathy.   Return office visit   4 months                   Told patient to return for periodic foot care and evaluation due to potential at risk complications.   Gardiner Barefoot DPM

## 2022-03-26 ENCOUNTER — Other Ambulatory Visit: Payer: Self-pay

## 2022-03-26 ENCOUNTER — Telehealth: Payer: Self-pay

## 2022-03-26 DIAGNOSIS — Z1211 Encounter for screening for malignant neoplasm of colon: Secondary | ICD-10-CM

## 2022-03-26 NOTE — Telephone Encounter (Signed)
Spoke with Patient and he wanted to have a Cologuard test completed, orders placed.

## 2022-03-26 NOTE — Telephone Encounter (Signed)
Copied from [place]. Topic: General - Inquiry >> Mar 26, 2022  1:49 PM McGill, Nelva Bush wrote: Reason for CRM: Pt stated received a letter from [employer] regarding having to get a Cologuard done. Pt stated he needs [name] to send the information for this.

## 2022-04-01 DIAGNOSIS — Z1211 Encounter for screening for malignant neoplasm of colon: Secondary | ICD-10-CM | POA: Diagnosis not present

## 2022-04-06 DIAGNOSIS — E119 Type 2 diabetes mellitus without complications: Secondary | ICD-10-CM | POA: Diagnosis not present

## 2022-04-06 DIAGNOSIS — I208 Other forms of angina pectoris: Secondary | ICD-10-CM | POA: Diagnosis not present

## 2022-04-06 DIAGNOSIS — R0602 Shortness of breath: Secondary | ICD-10-CM | POA: Diagnosis not present

## 2022-04-06 DIAGNOSIS — M353 Polymyalgia rheumatica: Secondary | ICD-10-CM | POA: Diagnosis not present

## 2022-04-06 DIAGNOSIS — I10 Essential (primary) hypertension: Secondary | ICD-10-CM | POA: Diagnosis not present

## 2022-04-06 DIAGNOSIS — I25118 Atherosclerotic heart disease of native coronary artery with other forms of angina pectoris: Secondary | ICD-10-CM | POA: Diagnosis not present

## 2022-04-06 DIAGNOSIS — E7800 Pure hypercholesterolemia, unspecified: Secondary | ICD-10-CM | POA: Diagnosis not present

## 2022-04-06 DIAGNOSIS — R001 Bradycardia, unspecified: Secondary | ICD-10-CM | POA: Diagnosis not present

## 2022-04-06 DIAGNOSIS — I2510 Atherosclerotic heart disease of native coronary artery without angina pectoris: Secondary | ICD-10-CM | POA: Diagnosis not present

## 2022-04-06 DIAGNOSIS — R0609 Other forms of dyspnea: Secondary | ICD-10-CM | POA: Diagnosis not present

## 2022-04-06 DIAGNOSIS — Z955 Presence of coronary angioplasty implant and graft: Secondary | ICD-10-CM | POA: Diagnosis not present

## 2022-04-10 LAB — COLOGUARD: COLOGUARD: NEGATIVE

## 2022-04-17 NOTE — Progress Notes (Unsigned)
Name: Austin Hunt   MRN: [id number]    DOB: April 07, 1941   Date:04/20/2022       Progress Note  Subjective  Chief Complaint  Follow up   HPI  DMII: Last A1C was high at 8.1 % he is back on 1000 mg of Metformin daily and also Actos 15 mg, glucose has improved, fasting in the 100 range. He has associated neuropathy, dyslipidemia. He denies polyphagia, polydipsia or polyphagia. Eye exam is up to date. He has noticed some increase pruritis on both feet at night, he saw Podiatrist and gave him lotrimin but is not helping, explained it may be neuropathy, he has not been taking gabapentin, he is not sure why   Malnutrition:  He used to be over 150 lbs, gradually losing weight, pants falling down, temporal waisting, states appetite is good , he has gained some weight recently but still below his baseline.   Leukopenia: chronic : currently under the care of Dr. Tasia Catchings and reassuring labs and was released from her care in April 2022 . Unchanged    BPH and prostate cancer: he is seeing Urologist and denies any obstructive symptoms, except for nocturia about 1-  2 times per night.  Incidental finding of prostate cancer during TURP back in 2015 , last PSA was done June 2021 and it was 2.12 , he is now on yearly follow up with Dr. Caryl Pina  and last PSA 1.04 February 2022  History of HTN : patient is off medication, no chest pain or palpitation, bp is at goal today    Dyslipidemia: he is not on statin therapy, myalgia in the past,  last LDl was 120 and stable, currently on Zetia and Lovaza    Chronic constipation: He has increased water intake , he drinks prune juice prn .Sometimes Miralax and stools softeners and is doing well, bowel movements daily now and no straining    CAD  He had a stress test done by Dr. Clayborn Bigness for evaluation of chest pain back in 11/2018 and results were normal . Last visit was 10/2021 He states symptoms resolved . He states chest pain under control with medications, he had a stent placed  2004. Doing well at this time    May 9 th 2022  NORMAL LEFT VENTRICULAR SYSTOLIC FUNCTION  NORMAL RIGHT VENTRICULAR SYSTOLIC FUNCTION  MILD VALVULAR REGURGITATION (See above)  NO VALVULAR STENOSIS  ESTIMATED LVEF >55%      11/2018:  Normal myocardial perfusion scan no evidence of stress-induced  myocardial ischemia ejection fraction of 64% conclusion negative scan.   Low risk scan   Recurrent rash on groin: seen by Dr. Phillip Heal and has [place] home, only using prn , symptoms controlled.   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   Failure of erection 04/08/2015   H/O inguinal hernia repair 04/08/2015   HLD (hyperlipidemia) 04/08/2015   LBP (low back pain) 04/08/2015   MI (mitral incompetence) 04/08/2015   Kidney lump 04/08/2015   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/08/2015   Unilateral recurrent femoral hernia without obstruction or gangrene 05/23/2014   Femoral hernia 05/23/2014   Right inguinal hernia 02/06/2014   Cataract 01/16/2014   Glaucoma 01/16/2014   Reflux esophagitis 06/21/2009   Coronary atherosclerosis 04/03/2009   Benign enlargement of prostate 02/11/2009   Decreased libido 01/12/2008   Benign essential HTN 09/12/2007    Past Surgical History:  Procedure Laterality Date   [place]  Oct 2015   CARDIAC CATHETERIZATION  2002   1 stent   COLONOSCOPY     EYE SURGERY  2011, 2013   cataracts   EYE  SURGERY Right May 2016   glaucoma   HERNIA REPAIR Left 1967   HERNIA REPAIR Right 05-10-14   Right femoral hernia repair Dr Bary Castilla with PerFix plug, no onlay mesh.Marland Kitchen    PHOTOCOAGULATION WITH LASER Right 12/30/2015   Procedure: PHOTOCOAGULATION WITH LASER;  Surgeon: Ronnell Freshwater, MD;  Location: [facility];  Service: Ophthalmology;  Laterality: Right;  DIABETIC - oral meds IVA BLOCK   PROSTATE SURGERY  Oct 2015    Family History  Problem Relation Age of Onset   Diabetes Sister    Diabetes Brother    Diabetes Sister    Prostate cancer Neg Hx    Bladder Cancer Neg Hx     Social History   Tobacco Use   Smoking status: Never   Smokeless tobacco: Never   Tobacco comments:    smoking cessation materials not required  Substance Use Topics   Alcohol use: No     Current Outpatient Medications:    Accu-Chek Softclix Lancets lancets, Use 1 lancet twice (2) daily to check blood glucose, Disp: 100 each, Rfl: 5   aspirin 81 MG tablet, Take 81 mg by mouth daily. , Disp: , Rfl:    Blood Glucose Monitoring Suppl (ACCU-CHEK GUIDE ME) w/Device KIT, , Disp: , Rfl:    brimonidine-timolol (COMBIGAN) 0.2-0.5 % ophthalmic solution, Place 1 drop into both eyes every 12 (twelve) hours., Disp: , Rfl:    Cholecalciferol (VITAMIN D) 50 MCG (2000 UT) CAPS, Take 1 capsule (2,000 Units total) by mouth daily., Disp: 30 capsule, Rfl: 0   clotrimazole-betamethasone (LOTRISONE) cream, Apply 1 application. topically at bedtime., Disp: 30 g, Rfl: 0   Cyanocobalamin (B-12) 1000 MCG SUBL, Place 1 tablet under the tongue daily., Disp: 30 tablet, Rfl: 0   docusate sodium (COLACE) 100 MG capsule, Take 100 mg by mouth daily., Disp: , Rfl:    dorzolamide (TRUSOPT) 2 % ophthalmic solution, 1 drop 2 (two) times daily. , Disp: , Rfl:    ezetimibe (ZETIA) 10 MG tablet, TAKE 1 TABLET BY MOUTH ONCE DAILY, Disp: 90 tablet, Rfl: 1   gabapentin (NEURONTIN) 100 MG capsule, Take 1-3 capsules (100-300 mg total) by  mouth at bedtime. For itching on foot (Patient taking differently: Take 100-300 mg by mouth at bedtime. For itching on foot Open capsules and empty into applesauce to avoid gelatin), Disp: 90 capsule, Rfl: 0   Garlic 10 MG CAPS, Take 15 mg by mouth daily. , Disp: , Rfl:    glucose blood (ACCU-CHEK GUIDE) test strip, USE AS DIRECTED. 4 TIMES DAILY, Disp: 100 each, Rfl: 10   icosapent Ethyl (VASCEPA) 1 g capsule, Take 2 capsules (2 g total) by mouth 2 (two) times daily., Disp: 120 capsule, Rfl: 5   ketoconazole (NIZORAL) 2 % cream, SMARTSIG:1 Application Topical 1 to 2 Times Daily, Disp: , Rfl:  latanoprost (XALATAN) 0.005 % ophthalmic solution, Place 1 drop into both eyes at bedtime. , Disp: , Rfl:    Magnesium 100 MG TABS, Take by mouth. , Disp: , Rfl:    metFORMIN (GLUCOPHAGE-XR) 500 MG 24 hr tablet, Take 2 tablets (1,000 mg total) by mouth daily with breakfast., Disp: 180 tablet, Rfl: 1   Multiple Vitamin (MULTIVITAMIN) tablet, Take 1 tablet by mouth daily., Disp: , Rfl:    Omega-3 Fatty Acids (FISH OIL) 500 MG CAPS, , Disp: , Rfl:    pioglitazone (ACTOS) 15 MG tablet, Take 1 tablet (15 mg total) by mouth daily., Disp: 90 tablet, Rfl: 1   polyethylene glycol powder (GLYCOLAX/MIRALAX) 17 GM/SCOOP powder, Take 17 g by mouth daily., Disp: 3350 g, Rfl: 1   sildenafil (REVATIO) 20 MG tablet, Take 1-5 tablets one hour prior to intercourse as needed., Disp: 30 tablet, Rfl: 6   triamcinolone (KENALOG) 0.025 % cream, Apply topically., Disp: , Rfl:    vitamin C (ASCORBIC ACID) 500 MG tablet, Take 500 mg by mouth 2 (two) times daily. Chewable, Disp: , Rfl:    Zinc Acetate 50 MG CAPS, Take 1 capsule by mouth daily. , Disp: , Rfl:   Allergies  Allergen Reactions   Colesevelam Other (See Comments)   Penicillins Itching and Other (See Comments)   Statins Other (See Comments)    myalgia   Welchol [Colesevelam Hcl]     I personally reviewed active problem list, medication list, allergies, family  history, social history, health maintenance with the patient/caregiver today.   ROS  Constitutional: Negative for fever or weight change.  Respiratory: Negative for cough and shortness of breath.   Cardiovascular: Negative for chest pain or palpitations.  Gastrointestinal: Negative for abdominal pain, no bowel changes.  Musculoskeletal: Negative for gait problem or joint swelling.  Skin: positive for rash.  Neurological: Negative for dizziness or headache.  No other specific complaints in a complete review of systems (except as listed in HPI above).   Objective  Vitals:   04/20/22 1131  BP: 128/74  Pulse: 65  Resp: 16  SpO2: 99%  Weight: 129 lb (58.5 kg)  Height: 5\' 9"  (1.753 m)    Body mass index is 19.05 kg/m.  Physical Exam  Constitutional: Patient appears well-developed and well-nourished.  No distress.  HEENT: head atraumatic, normocephalic, pupils equal and reactive to light, neck supple Cardiovascular: Normal rate, regular rhythm and normal heart sounds.  No murmur heard. No BLE edema. Pulmonary/Chest: Effort normal and breath sounds normal. No respiratory distress. Abdominal: Soft.  There is no tenderness. Psychiatric: Patient has a normal mood and affect. behavior is normal. Judgment and thought content normal.  Skin: some pilling on feet   Recent Results (from the past 2160 hour(s))  Urinalysis, Complete     Status: None   Collection Time: 02/12/22  9:51 AM  Result Value Ref Range   Specific Gravity, UA 1.020 1.005 - 1.030   pH, UA 7.0 5.0 - 7.5   Color, UA Yellow Yellow   Appearance Ur Clear Clear   Leukocytes,UA Negative Negative   Protein,UA Negative Negative/Trace   Glucose, UA Negative Negative   Ketones, UA Negative Negative   RBC, UA Negative Negative   Bilirubin, UA Negative Negative   Urobilinogen, Ur 1.0 0.2 - 1.0 mg/dL   Nitrite, UA Negative Negative   Microscopic Examination See below:   CULTURE, URINE COMPREHENSIVE     Status: None    Collection Time: 02/12/22  9:51 AM  Specimen: Urine   UR  Result Value Ref Range   Urine Culture, Comprehensive Final report    Organism ID, Bacteria Comment     Comment: No growth in 36 - 48 hours.  Microscopic Examination     Status: None   Collection Time: 02/12/22  9:51 AM   Urine  Result Value Ref Range   WBC, UA 0-5 0 - 5 /hpf   RBC 0-2 0 - 2 /hpf   Epithelial Cells (non renal) 0-10 0 - 10 /hpf   Bacteria, UA None seen None seen/Few  Bladder Scan (Post Void Residual) in office     Status: None   Collection Time: 02/12/22  9:54 AM  Result Value Ref Range   Scan Result 42m   PSA     Status: None   Collection Time: 02/12/22 10:30 AM  Result Value Ref Range   Prostate Specific Ag, Serum 1.5 0.0 - 4.0 ng/mL    Comment: Roche ECLIA methodology. According to the American Urological Association, Serum PSA should decrease and remain at undetectable levels after radical prostatectomy. The AUA defines biochemical recurrence as an initial PSA value 0.2 ng/mL or greater followed by a subsequent confirmatory PSA value 0.2 ng/mL or greater. Values obtained with different assay methods or kits cannot be used interchangeably. Results cannot be interpreted as absolute evidence of the presence or absence of malignant disease.   Cologuard     Status: None   Collection Time: 04/01/22 12:10 AM  Result Value Ref Range   COLOGUARD Negative Negative    Comment:  NEGATIVE TEST RESULT. A negative Cologuard result indicates a low likelihood that a colorectal cancer (CRC) or advanced adenoma (adenomatous polyps with more advanced pre-malignant features)  is present. The chance that a person with a negative Cologuard test has a colorectal cancer is less than 1 in 1500 (negative predictive value >99.9%) or has an  advanced adenoma is less than  5.3% (negative predictive value 94.7%). These data are based on a prospective cross-sectional study of 10,000 individuals at average risk for colorectal cancer  who were screened with both Cologuard and colonoscopy. (Imperiale T. et al, N Engl J Med 2014;370(14):1286-1297) The normal value (reference range) for this assay is negative.  COLOGUARD RE-SCREENING RECOMMENDATION: Periodic colorectal cancer screening is an important part of preventive healthcare for asymptomatic individuals at average risk for colorectal cancer.  Following a negative Cologuard result, the [place]ang [place] Force screening guidelines recommend a Cologuard re-screening interval of 3 years.  References: American Cancer Society Guideline for Colorectal Cancer Screening: https://www.cancer.org/cancer/colon-rectal-cancer/detection-diagnosis-staging/acs-recommendations.html.; Rex DK, Boland CR, Dominitz JK, Colorectal Cancer Screening: Recommendations for Physicians and Patients from the UEast [place] Force on Colorectal Cancer Screening , Am J Gastroenterology 2017; 1751:0258-5277  TEST DESCRIPTION: Composite algorithmic analysis of stool DNA-biomarkers with hemoglobin immunoassay.   Quantitative values of individual biomarkers are not reportable and are not associated with individual biomarker result reference ranges. Cologuard is intended for colorectal cancer screening of adults of either sex, 45years or older, who are at average-risk for colorectal cancer (CRC). Cologuard has been approved for use by the U.S. FDA. The performance of Cologuard was  established in a cross sectional study of average-risk adults aged 583-84 Cologuard performance in patients ages 468to 476years was estimated by sub-group analysis of near-age groups. Colonoscopies performed for a positive result may find as the most clinically significant lesion: colorectal cancer [4.0%], advanced adenoma (including sessile serrated polyps greater than or  equal to 1cm diameter) [20%] or non- advanced adenoma [31%]; or no colorectal neoplasia [45%]. These estimates are derived from a  prospective cross-sectional screening study of 10,000 individuals at average risk for colorectal cancer who were screened with both Cologuard and colonoscopy. (Imperiale T. et al, Alison Stalling J Med 2014;370(14):1286-1297.) Cologuard may produce a false negative or false positive result (no colorectal cancer or precancerous polyp present at colonoscopy follow up). A negative Cologuard test result does not guarantee the absence of CRC or advanced adenoma (pre-cancer). The current Cologuard  screening interval is every 3 years. Paramedic and [employer]). Cologuard performance data in a 10,000 patient pivotal study using colonoscopy as the reference method can be accessed at the following location: www.exactlabs.com/results. Additional description of the Cologuard test process, warnings and precautions can be found at www.cologuard.com.   POCT HgB A1C     Status: Abnormal   Collection Time: 04/20/22 11:32 AM  Result Value Ref Range   Hemoglobin A1C 7.2 (A) 4.0 - 5.6 %   HbA1c POC (<> result, manual entry)     HbA1c, POC (prediabetic range)     HbA1c, POC (controlled diabetic range)      PHQ2/9:    04/20/2022   11:31 AM 12/10/2021    9:16 AM 10/29/2021    9:28 AM 07/21/2021   10:51 AM 06/03/2021    9:35 AM  Depression screen PHQ 2/9  Decreased Interest 0 0 0 0 0  Down, Depressed, Hopeless 0 0 0 0 0  PHQ - 2 Score 0 0 0 0 0  Altered sleeping 0 0 0    Tired, decreased energy 0 0 0    Change in appetite 0 0 0    Feeling bad or failure about yourself  0 0 0    Trouble concentrating 0 0 0    Moving slowly or fidgety/restless 0 0 0    Suicidal thoughts 0 0 0    PHQ-9 Score 0 0 0      phq 9 is negative   Fall Risk:    04/20/2022   11:31 AM 12/10/2021    9:15 AM 10/29/2021    9:27 AM 07/21/2021   10:51 AM 06/03/2021    9:36 AM  [place] in the past year? 0 0 1 0 0  Number falls in past yr:  0 0 0 0  Injury with Fall? 0 0 0 0 0  Risk for fall due to : No  Fall Risks No Fall Risks No Fall Risks  No Fall Risks  Follow up Falls prevention discussed Falls prevention discussed Falls prevention discussed  Falls prevention discussed      Functional Status Survey: Is the patient deaf or have difficulty hearing?: No Does the patient have difficulty seeing, even when wearing glasses/contacts?: No Does the patient have difficulty concentrating, remembering, or making decisions?: No Does the patient have difficulty walking or climbing stairs?: No Does the patient have difficulty dressing or bathing?: No Does the patient have difficulty doing errands alone such as visiting a doctor's office or shopping?: No    Assessment & Plan  1. Type 2 diabetes, controlled, with peripheral neuropathy (HCC)  - POCT HgB A1C  2. Other neutropenia ([place])  Released from hematologist   3. Mild protein-calorie malnutrition ([place])  Continue high calorie diet   4. B12 deficiency  Taking supplementation  5. Statin myopathy   6. Need for pneumococcal 20-valent conjugate vaccination  - Pneumococcal  conjugate vaccine 20-valent (Prevnar 20)  7. Atherosclerosis of native coronary artery of native heart without angina pectoris   8. Hyperlipidemia associated with type 2 diabetes mellitus (HCC)  Statin myopathy, taking zetia   9. History of hypertension   10. Vitamin D deficiency

## 2022-04-20 ENCOUNTER — Ambulatory Visit (INDEPENDENT_AMBULATORY_CARE_PROVIDER_SITE_OTHER): Payer: Medicare Other | Admitting: Family Medicine

## 2022-04-20 ENCOUNTER — Encounter: Payer: Self-pay | Admitting: Family Medicine

## 2022-04-20 VITALS — BP 128/74 | HR 65 | Resp 16 | Ht 69.0 in | Wt 129.0 lb

## 2022-04-20 DIAGNOSIS — N521 Erectile dysfunction due to diseases classified elsewhere: Secondary | ICD-10-CM

## 2022-04-20 DIAGNOSIS — E1169 Type 2 diabetes mellitus with other specified complication: Secondary | ICD-10-CM

## 2022-04-20 DIAGNOSIS — E785 Hyperlipidemia, unspecified: Secondary | ICD-10-CM

## 2022-04-20 DIAGNOSIS — Z23 Encounter for immunization: Secondary | ICD-10-CM | POA: Diagnosis not present

## 2022-04-20 DIAGNOSIS — T466X5A Adverse effect of antihyperlipidemic and antiarteriosclerotic drugs, initial encounter: Secondary | ICD-10-CM

## 2022-04-20 DIAGNOSIS — E1140 Type 2 diabetes mellitus with diabetic neuropathy, unspecified: Secondary | ICD-10-CM

## 2022-04-20 DIAGNOSIS — G720 Drug-induced myopathy: Secondary | ICD-10-CM

## 2022-04-20 DIAGNOSIS — E441 Mild protein-calorie malnutrition: Secondary | ICD-10-CM | POA: Diagnosis not present

## 2022-04-20 DIAGNOSIS — Z8679 Personal history of other diseases of the circulatory system: Secondary | ICD-10-CM | POA: Diagnosis not present

## 2022-04-20 DIAGNOSIS — E1142 Type 2 diabetes mellitus with diabetic polyneuropathy: Secondary | ICD-10-CM | POA: Diagnosis not present

## 2022-04-20 DIAGNOSIS — E538 Deficiency of other specified B group vitamins: Secondary | ICD-10-CM | POA: Diagnosis not present

## 2022-04-20 DIAGNOSIS — D708 Other neutropenia: Secondary | ICD-10-CM

## 2022-04-20 DIAGNOSIS — E559 Vitamin D deficiency, unspecified: Secondary | ICD-10-CM | POA: Diagnosis not present

## 2022-04-20 DIAGNOSIS — I2510 Atherosclerotic heart disease of native coronary artery without angina pectoris: Secondary | ICD-10-CM | POA: Diagnosis not present

## 2022-04-20 LAB — POCT GLYCOSYLATED HEMOGLOBIN (HGB A1C): Hemoglobin A1C: 7.2 % — AB (ref 4.0–5.6)

## 2022-04-23 DIAGNOSIS — H401133 Primary open-angle glaucoma, bilateral, severe stage: Secondary | ICD-10-CM | POA: Diagnosis not present

## 2022-04-23 DIAGNOSIS — Z961 Presence of intraocular lens: Secondary | ICD-10-CM | POA: Diagnosis not present

## 2022-05-15 ENCOUNTER — Ambulatory Visit: Payer: Self-pay | Admitting: Urology

## 2022-05-21 NOTE — Progress Notes (Signed)
05/22/22 12:17 PM   Thereasa Hunt 1941/07/06 [id number]  Referring provider:  [name], MD [address] [place],  [place] [address] Chief Complaint  Patient presents with   Prostate Cancer      HPI: Austin Hunt is a 81 y.o.male with a personal history of prostate cancer and erectile dysfunction  who presents today for a 1 year follow-up with IPSS, PVR, and PSA.   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 No longer on dutasteride. PSA 0.7 on dutasteride.   His most recent PSA was 1.5 on 02/12/2022.   He reports that he never filled sildenafil due to some medical issues with his wife from which she is recovering nicely. He reports intermittent weak stream.   IPSS 19 below PVR 111 ml.    IPSS     Row Name 05/22/22 1000         International Prostate Symptom Score   How often have you had the sensation of not emptying your bladder? About half the time     How often have you had to urinate less than every two hours? More than half the time     How often have you found you stopped and started again several times when you urinated? More than half the time     How often have you found it difficult to postpone urination? More than half the time     How often have you had a weak urinary stream? Less than 1 in 5 times     How often have you had to strain to start urination? Less than 1 in 5 times     How many times did you typically get up at night to urinate? 2 Times     Total IPSS Score 19       Quality of Life due to urinary symptoms   If you were to spend the rest of your life with your urinary condition just the way it is now how would you feel about that? 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   Balanitis    BPH (benign prostatic hypertrophy) with urinary  obstruction    Calculus in bladder    Diabetes mellitus without complication (HCC)    Glaucoma    Headache    rare - 1x/mo   Nocturia    Prostate cancer [facility])     Surgical History: Past Surgical History:  Procedure Laterality Date   [place] SURGERY  Oct 2015   CARDIAC CATHETERIZATION  2002   1 stent   COLONOSCOPY     EYE SURGERY  2011, 2013   cataracts   EYE SURGERY Right May 2016   glaucoma   HERNIA REPAIR Left 1967   HERNIA REPAIR Right 05-10-14   Right femoral hernia repair Dr Bary Castilla with PerFix plug, no onlay mesh.Marland Kitchen    PHOTOCOAGULATION WITH LASER Right 12/30/2015   Procedure: PHOTOCOAGULATION WITH LASER;  Surgeon: Ronnell Freshwater, MD;  Location: [place];  Service: Ophthalmology;  Laterality: Right;  DIABETIC - oral meds IVA BLOCK   PROSTATE SURGERY  Oct 2015    Home Medications:  Allergies as of 05/22/2022       Reactions  Colesevelam Other (See Comments)   Penicillins Itching, Other (See Comments)   Statins Other (See Comments)   myalgia   Welchol [colesevelam Hcl]         Medication List        Accurate as of May 22, 2022 12:17 PM. If you have any questions, ask your nurse or doctor.          Accu-Chek Guide Me w/Device Kit   Accu-Chek Guide test strip Generic drug: glucose blood USE AS DIRECTED. 4 TIMES DAILY   Accu-Chek Softclix Lancets lancets Use 1 lancet twice (2) daily to check blood glucose   aspirin 81 MG tablet Take 81 mg by mouth daily.   B-12 1000 MCG Subl Place 1 tablet under the tongue daily.   brimonidine-timolol 0.2-0.5 % ophthalmic solution Commonly known as: COMBIGAN Place 1 drop into both eyes every 12 (twelve) hours.   clotrimazole-betamethasone lotion Commonly known as: LOTRISONE Apply 1 each topically 2 (two) times daily.   clotrimazole-betamethasone cream Commonly known as: Lotrisone Apply 1 application. topically at bedtime.   docusate sodium 100 MG capsule Commonly  known as: COLACE Take 100 mg by mouth daily.   dorzolamide 2 % ophthalmic solution Commonly known as: TRUSOPT 1 drop 2 (two) times daily.   ezetimibe 10 MG tablet Commonly known as: ZETIA TAKE 1 TABLET BY MOUTH ONCE DAILY   Fish Oil 836 MG Caps   Garlic 10 MG Caps Take 15 mg by mouth daily.   icosapent Ethyl 1 g capsule Commonly known as: Vascepa Take 2 capsules (2 g total) by mouth 2 (two) times daily.   ketoconazole 2 % cream Commonly known as: NIZORAL SMARTSIG:1 Application Topical 1 to 2 Times Daily   latanoprost 0.005 % ophthalmic solution Commonly known as: XALATAN Place 1 drop into both eyes at bedtime.   Magnesium 100 MG Tabs Take by mouth.   metFORMIN 500 MG 24 hr tablet Commonly known as: GLUCOPHAGE-XR Take 2 tablets (1,000 mg total) by mouth daily with breakfast.   multivitamin tablet Take 1 tablet by mouth daily.   pioglitazone 15 MG tablet Commonly known as: Actos Take 1 tablet (15 mg total) by mouth daily.   polyethylene glycol powder 17 GM/SCOOP powder Commonly known as: GLYCOLAX/MIRALAX Take 17 g by mouth daily.   sildenafil 20 MG tablet Commonly known as: REVATIO Take 1-5 tablets one hour prior to intercourse as needed.   triamcinolone 0.025 % cream Commonly known as: KENALOG Apply topically.   vitamin C 500 MG tablet Commonly known as: ASCORBIC ACID Take 500 mg by mouth 2 (two) times daily. Chewable   Vitamin D 50 MCG (2000 UT) Caps Take 1 capsule (2,000 Units total) by mouth daily.   Zinc Acetate 50 MG Caps Take 1 capsule by mouth daily.        Allergies:  Allergies  Allergen Reactions   Colesevelam Other (See Comments)   Penicillins Itching and Other (See Comments)   Statins Other (See Comments)    myalgia   Welchol [Colesevelam Hcl]     Family History: Family History  Problem Relation Age of Onset   Diabetes Sister    Diabetes Brother    Diabetes Sister    Prostate cancer Neg Hx    Bladder Cancer Neg Hx      Social History:  reports that he has never smoked. He has never used smokeless tobacco. He reports that he does not drink alcohol and does not use drugs.   Physical Exam: BP 119/64  Pulse 69   Ht 5' 9"  (1.753 m)   Wt 130 lb (59 kg)   BMI 19.20 kg/m   Constitutional:  Alert and oriented, No acute distress. HEENT: [place] AT, moist mucus membranes.  Trachea midline, no masses. Cardiovascular: No clubbing, cyanosis, or edema. Respiratory: Normal respiratory effort, no increased work of breathing. Skin: No rashes, bruises or suspicious lesions. Neurologic: Grossly intact, no focal deficits, moving all 4 extremities. Psychiatric: Normal mood and affect.  Laboratory Data:  Lab Results  Component Value Date   CREATININE 0.84 12/10/2021   Lab Results  Component Value Date   HGBA1C 7.2 (A) 04/20/2022    Pertinent Imaging: Results for orders placed or performed in visit on 05/22/22  BLADDER SCAN AMB NON-IMAGING  Result Value Ref Range   Scan Result 111 ml     Assessment & Plan:    Prostate cancer  -Low risk incidental prostate cancer with stable PSA - Given his age and comorbidities, will continue to just check a PSA annually at this point -  PSA 1.5 on 02/12/2022  2. Combined arterial insufficiency and corporo-venous occlusive erectile dysfunction - Not currently on any medication and not bothered by this.  -Continue sildenafil as needed  3. Weak stream with incomplete bladder emptying -Occasional we discussed meds versus redo procedure. He is not interested in either will let us know if he would like to proceed with one.  -We will continue to monitor incomplete emptying, borderline today  F/u 1 year IPSS./ PVR/ PSA  Conley Rolls as a scribe for Hollice Espy, MD.,have documented all relevant documentation on the behalf of Hollice Espy, MD,as directed by  Hollice Espy, MD while in the presence of Hollice Espy, MD.  I have reviewed the above  documentation for accuracy and completeness, and I agree with the above.   Hollice Espy, MD   [facility] [address] [place], [place] [address] [phone]

## 2022-05-22 ENCOUNTER — Encounter: Payer: Self-pay | Admitting: Urology

## 2022-05-22 ENCOUNTER — Ambulatory Visit (INDEPENDENT_AMBULATORY_CARE_PROVIDER_SITE_OTHER): Payer: Medicare Other | Admitting: Urology

## 2022-05-22 VITALS — BP 119/64 | HR 69 | Ht 69.0 in | Wt 130.0 lb

## 2022-05-22 DIAGNOSIS — R3912 Poor urinary stream: Secondary | ICD-10-CM

## 2022-05-22 DIAGNOSIS — Z8546 Personal history of malignant neoplasm of prostate: Secondary | ICD-10-CM

## 2022-05-22 DIAGNOSIS — R339 Retention of urine, unspecified: Secondary | ICD-10-CM | POA: Diagnosis not present

## 2022-05-22 DIAGNOSIS — I2510 Atherosclerotic heart disease of native coronary artery without angina pectoris: Secondary | ICD-10-CM | POA: Diagnosis not present

## 2022-05-22 DIAGNOSIS — C61 Malignant neoplasm of prostate: Secondary | ICD-10-CM

## 2022-05-22 DIAGNOSIS — N5203 Combined arterial insufficiency and corporo-venous occlusive erectile dysfunction: Secondary | ICD-10-CM | POA: Diagnosis not present

## 2022-05-22 LAB — BLADDER SCAN AMB NON-IMAGING: Scan Result: 111

## 2022-06-04 ENCOUNTER — Ambulatory Visit (INDEPENDENT_AMBULATORY_CARE_PROVIDER_SITE_OTHER): Payer: Medicare Other

## 2022-06-04 DIAGNOSIS — Z0000 Encounter for general adult medical examination without abnormal findings: Secondary | ICD-10-CM | POA: Diagnosis not present

## 2022-06-04 NOTE — Progress Notes (Addendum)
Subjective:  I connected with  Austin Hunt on 06/04/22 by a audio enabled telemedicine application and verified that I am speaking with the correct person using two identifiers.  Patient Location: Home  Provider Location: Office/Clinic  I discussed the limitations of evaluation and management by telemedicine. The patient expressed understanding and agreed to proceed.  Austin Hunt is a 81 y.o. male who presents for Medicare Annual/Subsequent preventive examination.  Review of Systems    Defer to PCP       Objective:    There were no vitals filed for this visit. There is no height or weight on file to calculate BMI.     06/03/2021    9:36 AM 02/28/2021    2:48 PM 11/26/2020   11:00 AM 11/12/2020    9:42 AM 05/30/2020    9:40 AM 05/30/2019    9:59 AM 05/24/2018    9:03 AM  Advanced Directives  Does Patient Have a Medical Advance Directive? No Yes Yes Yes No No Yes;No  Type of Advance Directive  Living will Living will Living will   Living will  Does patient want to make changes to medical advance directive?       Yes (MAU/Ambulatory/Procedural Areas - Information given)  Would patient like information on creating a medical advance directive? Yes (MAU/Ambulatory/Procedural Areas - Information given)    Yes (MAU/Ambulatory/Procedural Areas - Information given) Yes (MAU/Ambulatory/Procedural Areas - Information given)     Current Medications (verified) Outpatient Encounter Medications as of 06/04/2022  Medication Sig   Accu-Chek Softclix Lancets lancets Use 1 lancet twice (2) daily to check blood glucose   aspirin 81 MG tablet Take 81 mg by mouth daily.    Blood Glucose Monitoring Suppl (ACCU-CHEK GUIDE ME) w/Device KIT    brimonidine-timolol (COMBIGAN) 0.2-0.5 % ophthalmic solution Place 1 drop into both eyes every 12 (twelve) hours.   Cholecalciferol (VITAMIN D) 50 MCG (2000 UT) CAPS Take 1 capsule (2,000 Units total) by mouth daily.   clotrimazole-betamethasone  (LOTRISONE) cream Apply 1 application. topically at bedtime.   clotrimazole-betamethasone (LOTRISONE) lotion Apply 1 each topically 2 (two) times daily.   Cyanocobalamin (B-12) 1000 MCG SUBL Place 1 tablet under the tongue daily.   docusate sodium (COLACE) 100 MG capsule Take 100 mg by mouth daily.   dorzolamide (TRUSOPT) 2 % ophthalmic solution 1 drop 2 (two) times daily.    ezetimibe (ZETIA) 10 MG tablet TAKE 1 TABLET BY MOUTH ONCE DAILY   Garlic 10 MG CAPS Take 15 mg by mouth daily.    glucose blood (ACCU-CHEK GUIDE) test strip USE AS DIRECTED. 4 TIMES DAILY   icosapent Ethyl (VASCEPA) 1 g capsule Take 2 capsules (2 g total) by mouth 2 (two) times daily.   ketoconazole (NIZORAL) 2 % cream SMARTSIG:1 Application Topical 1 to 2 Times Daily   latanoprost (XALATAN) 0.005 % ophthalmic solution Place 1 drop into both eyes at bedtime.    Magnesium 100 MG TABS Take by mouth.    metFORMIN (GLUCOPHAGE-XR) 500 MG 24 hr tablet Take 2 tablets (1,000 mg total) by mouth daily with breakfast.   Multiple Vitamin (MULTIVITAMIN) tablet Take 1 tablet by mouth daily.   Omega-3 Fatty Acids (FISH OIL) 500 MG CAPS    pioglitazone (ACTOS) 15 MG tablet Take 1 tablet (15 mg total) by mouth daily.   polyethylene glycol powder (GLYCOLAX/MIRALAX) 17 GM/SCOOP powder Take 17 g by mouth daily.   sildenafil (REVATIO) 20 MG tablet Take 1-5 tablets one  hour prior to intercourse as needed.   triamcinolone (KENALOG) 0.025 % cream Apply topically.   vitamin C (ASCORBIC ACID) 500 MG tablet Take 500 mg by mouth 2 (two) times daily. Chewable   Zinc Acetate 50 MG CAPS Take 1 capsule by mouth daily.    No facility-administered encounter medications on file as of 06/04/2022.    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   Balanitis    BPH (benign prostatic hypertrophy) with urinary obstruction     Calculus in bladder    Diabetes mellitus without complication (HCC)    Glaucoma    Headache    rare - 1x/mo   Nocturia    Prostate cancer [facility])    Past Surgical History:  Procedure Laterality Date   [place] SURGERY  Oct 2015   CARDIAC CATHETERIZATION  2002   1 stent   COLONOSCOPY     EYE SURGERY  2011, 2013   cataracts   EYE SURGERY Right May 2016   glaucoma   HERNIA REPAIR Left 1967   HERNIA REPAIR Right 05-10-14   Right femoral hernia repair Dr Bary Castilla with PerFix plug, no onlay mesh.Marland Kitchen    PHOTOCOAGULATION WITH LASER Right 12/30/2015   Procedure: PHOTOCOAGULATION WITH LASER;  Surgeon: Ronnell Freshwater, MD;  Location: [place];  Service: Ophthalmology;  Laterality: Right;  DIABETIC - oral meds IVA BLOCK   PROSTATE SURGERY  Oct 2015   Family History  Problem Relation Age of Onset   Diabetes Sister    Diabetes Brother    Diabetes Sister    Prostate cancer Neg Hx    Bladder Cancer Neg Hx    Social History   Socioeconomic History   Marital status: Married    Spouse name: Onalee Hua   Number of children: 4   Years of education: some college   Highest education level: 12th grade  Occupational History    Employer: RETIRED  Tobacco Use   Smoking status: Never   Smokeless tobacco: Never   Tobacco comments:    smoking cessation materials not required  Vaping Use   Vaping Use: Never used  Substance and Sexual Activity   Alcohol use: No   Drug use: No   Sexual activity: Yes  Other Topics Concern   Not on file  Social History Narrative   Not on file   Social Determinants of Health   Financial Resource Strain: Low Risk  (06/04/2022)   Overall Financial Resource Strain (CARDIA)    Difficulty of Paying Living Expenses: Not hard at all  Food Insecurity: No Food Insecurity (06/04/2022)   Hunger Vital Sign    Worried About Running Out of Food in the Last Year: Never true    [place] in the Last Year: Never true  Transportation  Needs: No Transportation Needs (06/04/2022)   PRAPARE - Hydrologist (Medical): No    Lack of Transportation (Non-Medical): No  Physical Activity: Sufficiently Active (06/04/2022)   Exercise Vital Sign    Days of Exercise per Week: 5 days    Minutes of Exercise per Session: 30 min  Stress: No Stress Concern Present (06/04/2022)   [place]    Feeling of Stress : Only a little  Social Connections: Moderately Integrated (06/04/2022)   Social Connection and  Isolation Panel [NHANES]    Frequency of Communication with Friends and Family: More than three times a week    Frequency of Social Gatherings with Friends and Family: More than three times a week    Attends Religious Services: More than 4 times per year    Active Member of [employer] or Organizations: No    Attends Archivist Meetings: Never    Marital Status: Married    Tobacco Counseling Counseling given: Not Answered Tobacco comments: smoking cessation materials not required   Clinical Intake:                 Diabetic? Last A1C 7.2 on 04/20/2022         Activities of Daily Living    06/04/2022    8:34 AM 04/20/2022   11:31 AM  In your present state of health, do you have any difficulty performing the following activities:  Hearing? 0 0  Vision? 0 0  Difficulty concentrating or making decisions? 0 0  Walking or climbing stairs? 0 0  Dressing or bathing? 0 0  Doing errands, shopping? 0 0    Patient Care Team: [name], MD as PCP - General (Family Medicine) Yolonda Kida, MD as Consulting Physician (Cardiology) Hollice Espy, MD as Consulting Physician (Urology) Leandrew Koyanagi, MD as Consulting Physician (Ophthalmology) Gardiner Barefoot, DPM as Consulting Physician (Podiatry) Germaine Pomfret, [facility] (Pharmacist) Earlie Server, MD as Consulting Physician (Oncology) Ree Edman, MD  (Dermatology)  Indicate any recent Medical Services you may have received from other than [facility] providers in the past year (date may be approximate).     Assessment:   This is a routine wellness examination for Alfie.  Hearing/Vision screen No results found.  Dietary issues and exercise activities discussed:     Goals Addressed   None   Depression Screen    06/04/2022    8:31 AM 04/20/2022   11:31 AM 12/10/2021    9:16 AM 10/29/2021    9:28 AM 07/21/2021   10:51 AM 06/03/2021    9:35 AM 03/20/2021    9:42 AM  PHQ 2/9 Scores  PHQ - 2 Score 0 0 0 0 0 0 0  PHQ- 9 Score 0 0 0 0       Fall Risk    06/04/2022    8:33 AM 04/20/2022   11:31 AM 12/10/2021    9:15 AM 10/29/2021    9:27 AM 07/21/2021   10:51 AM  Fall Risk   Falls in the past year? 0 0 0 1 0  Number falls in past yr:   0 0 0  Injury with Fall?  0 0 0 0  Risk for fall due to : No Fall Risks No Fall Risks No Fall Risks No Fall Risks   Follow up Education provided;Falls evaluation completed Falls prevention discussed Falls prevention discussed Falls prevention discussed     FALL RISK PREVENTION PERTAINING TO THE HOME:  Any stairs in or around the home? Yes  If so, are there any without handrails? Yes  Home free of loose throw rugs in walkways, pet beds, electrical cords, etc? Yes  Adequate lighting in your home to reduce risk of falls? Yes   ASSISTIVE DEVICES UTILIZED TO PREVENT FALLS:  Life alert? Yes  Use of a cane, walker or w/c? Yes  Grab bars in the bathroom? Yes  Shower chair or bench in shower? Yes  Elevated toilet seat or a handicapped toilet? Yes   TIMED UP AND  GO:  Was the test performed? No .  Length of time to ambulate 10 feet: N/A sec.     Cognitive Function:        06/04/2022    8:33 AM 05/30/2020    9:46 AM 05/30/2019   10:01 AM 05/24/2018    8:54 AM 08/31/2016    9:09 AM  6CIT Screen  What Year? 0 points 0 points 0 points 0 points 0 points  What month? 0 points 0 points 0 points 0 points  0 points  What time? 0 points 0 points 0 points 0 points 0 points  Count back from 20 0 points 0 points 0 points 0 points 0 points  Months in reverse 0 points 0 points 0 points 0 points 0 points  Repeat phrase 0 points 0 points 0 points 6 points 0 points  Total Score 0 points 0 points 0 points 6 points 0 points    Immunizations Immunization History  Administered Date(s) Administered   Fluad Quad(high Dose 65+) 08/01/2019, 08/19/2020, 07/22/2021   Influenza Split 07/02/2010   Influenza, High Dose Seasonal PF 08/14/2015, 07/30/2016, 08/19/2017, 08/09/2018   Influenza, Seasonal, Injecte, Preservative Fre 07/08/2011, 07/22/2012   Influenza,inj,Quad PF,6+ Mos 07/11/2013   PFIZER(Purple Top)SARS-COV-2 Vaccination 11/22/2019, 12/16/2019, 08/04/2020, 01/30/2021, 07/14/2021   PNEUMOCOCCAL CONJUGATE-20 04/20/2022   Pneumococcal Conjugate-13 01/22/2014   Pneumococcal-Unspecified 01/03/2008   Tdap 11/04/2010, 11/05/2020   Zoster Recombinat (Shingrix) 06/25/2020, 10/03/2020   Zoster, Live 06/14/2008    TDAP status: Up to date  Flu Vaccine status: Up to date  Pneumococcal vaccine status: Up to date  Covid-19 vaccine status: Completed vaccines  Qualifies for Shingles Vaccine? No   Zostavax completed No   Shingrix Completed?: Yes  Screening Tests Health Maintenance  Topic Date Due   COVID-19 Vaccine (6 - Booster for Pfizer series) 07/03/2022 (Originally 09/08/2021)   INFLUENZA VACCINE  01/31/2023 (Originally 06/02/2022)   OPHTHALMOLOGY EXAM  08/19/2022   HEMOGLOBIN A1C  10/20/2022   FOOT EXAM  11/17/2022   URINE MICROALBUMIN  12/10/2022   TETANUS/TDAP  11/05/2030   Pneumonia Vaccine 67+ Years old  Completed   Zoster Vaccines- Shingrix  Completed   HPV VACCINES  Aged Out    Health Maintenance  There are no preventive care reminders to display for this patient.  Colorectal cancer screening: Type of screening: Cologuard. Completed 04/21/2022. Repeat every 5 years  Lung Cancer  Screening: (Low Dose CT Chest recommended if Age 52-80 years, 30 pack-year currently smoking OR have quit w/in 15years.) does not qualify.   Lung Cancer Screening Referral: N/A  Additional Screening:  Hepatitis C Screening: does not qualify; Completed N/A  Vision Screening: Recommended annual ophthalmology exams for early detection of glaucoma and other disorders of the eye. Is the patient up to date with their annual eye exam?  Yes  Who is the provider or what is the name of the office in which the patient attends annual eye exams?  If pt is not established with a provider, would they like to be referred to a provider to establish care? Yes .   Dental Screening: Recommended annual dental exams for proper oral hygiene  Community Resource Referral / Chronic Care Management: CRR required this visit?  No   CCM required this visit?  No      Plan:     I have personally reviewed and noted the following in the patient's chart:   Medical and social history Use of alcohol, tobacco or illicit drugs  Current medications and supplements  including opioid prescriptions. Patient is not currently taking opioid prescriptions. Functional ability and status Nutritional status Physical activity Advanced directives List of other physicians Hospitalizations, surgeries, and ER visits in previous 12 months Vitals Screenings to include cognitive, depression, and falls Referrals and appointments  In addition, I have reviewed and discussed with patient certain preventive protocols, quality metrics, and best practice recommendations. A written personalized care plan for preventive services as well as general preventive health recommendations were provided to patient.     Royal Hawthorn, CMA   06/04/2022   Nurse Notes: Non-Face to Face minutes 32 minutes  Mr. Sleight , Thank you for taking time to come for your Medicare Wellness Visit. I appreciate your ongoing commitment to your health goals. Please  review the following plan we discussed and let me know if I can assist you in the future.   These are the goals we discussed:  Goals       DIET - INCREASE WATER INTAKE      Recommend to drink at least 6-8 8oz glasses of water per day.      Exercise      Starting 08/31/16, I will continue exercising 4 times a week for 40 minutes.      I need a constipation medicine that doesn't use animal products (pt-stated)      Current Barriers:  Chronic constipation Long time vegetarian, avoids animal products such as gelatin used in many OTCs  Uses prune juice for constipation, but this gives him diarrhea  Pharmacist Clinical Goal(s):  Over the next 30 days, patient and clinical pharmacist will work together to find a suitable treatment for chronic constipation that meet's patients lifestyle.   Interventions: Identify alternative therapies for chronic constipation that are compatible with patient's vegetarian lifestyle and beliefs.   Patient Self Care Activities:  Calls pharmacy for medication refills Calls provider office for new concerns or questions  Initial goal documentation       Monitor and Manage My Blood Sugar-Diabetes Type 2      Timeframe:  Long-Range Goal Priority:  High Start Date:    01/02/21                         Expected End Date:  01/08/23                     Follow up within 90 days   -check blood sugar daily before breakfast - check blood sugar if I feel it is too high or too low - enter blood sugar readings and medication or insulin into daily log    Why is this important?   Checking your blood sugar at home helps to keep it from getting very high or very low.  Writing the results in a diary or log helps the doctor know how to care for you.  Your blood sugar log should have the time, date and the results.  Also, write down the amount of insulin or other medicine that you take.  Other information, like what you ate, exercise done and how you were feeling, will also  be helpful.     Notes:       My eye drops are expensive (pt-stated)      Current Barriers:  financial  Pharmacist Clinical Goal(s): Over the next 30 days, Mr.. Eberlin will provide the necessary supplementary documents (proof of out of pocket prescription expenditure, proof of household income) needed for medication assistance applications  to CCM pharmacist pending available programs or foundations.   Interventions: [facility] pharmacist will research for medication assistance program for Combigan, Trusopt, and Xalatan (prescriber: Dr[facility] Leandrew [facility]agi at [facility])  Patient Self Care Activities:  Gather necessary documents needed to apply for medication assistance  Initial goal documentation          This is a list of the screening recommended for you and due dates:  Health Maintenance  Topic Date Due   COVID-19 Vaccine (6 - Booster for [place] series) 07/03/2022*   Flu Shot  01/31/2023*   Eye exam for diabetics  08/19/2022   Hemoglobin A1C  10/20/2022   Complete foot exam   11/17/2022   Urine Protein Check  12/10/2022   Tetanus Vaccine  11/05/2030   Pneumonia Vaccine  Completed   Zoster (Shingles) Vaccine  Completed   HPV Vaccine  Aged Out  *Topic was postponed. The date shown is not the original due date.      I have reviewed this encounter including the documentation in this note and/or discussed this patient with the provider, Rexford Maus, CMA I am certifying that I agree with the content of this note as supervising physician.  [name], MD [facility] 06/08/2022, 5:02 PM

## 2022-06-29 ENCOUNTER — Ambulatory Visit: Payer: Medicare Other | Admitting: Podiatry

## 2022-06-29 ENCOUNTER — Other Ambulatory Visit: Payer: Self-pay | Admitting: Family Medicine

## 2022-06-29 DIAGNOSIS — E1169 Type 2 diabetes mellitus with other specified complication: Secondary | ICD-10-CM

## 2022-06-29 DIAGNOSIS — E785 Hyperlipidemia, unspecified: Secondary | ICD-10-CM

## 2022-07-08 ENCOUNTER — Ambulatory Visit (INDEPENDENT_AMBULATORY_CARE_PROVIDER_SITE_OTHER): Payer: Medicare Other

## 2022-07-08 DIAGNOSIS — E1142 Type 2 diabetes mellitus with diabetic polyneuropathy: Secondary | ICD-10-CM

## 2022-07-08 DIAGNOSIS — E1169 Type 2 diabetes mellitus with other specified complication: Secondary | ICD-10-CM

## 2022-07-08 NOTE — Progress Notes (Signed)
Chronic Care Management Pharmacy Note  07/08/2022 Name:  Austin Hunt MRN:  [id number] DOB:  03-23-1941  Summary: Patient presents for CCM follow-up. Tolerating vascepa well. Fasting blood sugars well controlled.   Recommendations/Changes made from today's visit: Continue current medications  Plan: No further follow up required: Patient will reach out to clinical team for further questions.  Subjective: Austin Hunt is an 81 y.o. year old male who is a primary patient of [name], MD.  The CCM team was consulted for assistance with disease management and care coordination needs.    Engaged with patient by telephone for follow up visit in response to provider referral for pharmacy case management and/or care coordination services.   Consent to Services:  The patient was given information about Chronic Care Management services, agreed to services, and gave verbal consent prior to initiation of services.  Please see initial visit note for detailed documentation.   Patient Care Team: [name], MD as PCP - General (Family Medicine) Yolonda Kida, MD as Consulting Physician (Cardiology) Hollice Espy, MD as Consulting Physician (Urology) Leandrew Koyanagi, MD as Consulting Physician (Ophthalmology) Gardiner Barefoot, DPM as Consulting Physician (Podiatry) Germaine Pomfret, [facility] (Pharmacist) Earlie Server, MD as Consulting Physician (Oncology) Ree Edman, MD (Dermatology)  Recent office visits: 04/20/22: Patient presented to [name] for follow-up. A1c 7.2%.   Recent consult visits: 04/06/22: Patient presented to Dr. Clayborn Bigness (Cardiology)  Hospital visits: None in previous 6 months  Objective:  Lab Results  Component Value Date   CREATININE 0.84 12/10/2021   BUN 16 12/10/2021   GFRNONAA >60 02/24/2021   GFRAA 96 11/05/2020   NA 139 12/10/2021   K 4.4 12/10/2021   CALCIUM 9.7 12/10/2021   CO2 33 (H) 12/10/2021    Lab Results  Component  Value Date/Time   HGBA1C 7.2 (A) 04/20/2022 11:32 AM   HGBA1C 7.8 (H) 12/10/2021 10:04 AM   HGBA1C 8.1 (A) 10/29/2021 01:16 PM   HGBA1C 7.1 (H) 11/05/2020 09:57 AM   HGBA1C 7.2 10/19/2018 02:11 PM   HGBA1C 7.2 (A) 10/19/2018 02:11 PM   HGBA1C 7.2 (A) 10/19/2018 02:11 PM   MICROALBUR 0.2 12/10/2021 10:04 AM   MICROALBUR 0.4 06/21/2020 09:10 AM   MICROALBUR 20 01/04/2017 10:45 AM    Last diabetic Eye exam:  Lab Results  Component Value Date/Time   HMDIABEYEEXA No Retinopathy 08/19/2021 12:00 AM    Last diabetic Foot exam:  Lab Results  Component Value Date/Time   HMDIABFOOTEX Abnormal 04/25/2018 12:00 AM   HMDIABFOOTEX Abnormal 04/25/2018 12:00 AM     Lab Results  Component Value Date   CHOL 196 12/10/2021   HDL 62 12/10/2021   LDLCALC 120 (H) 12/10/2021   TRIG 58 12/10/2021   CHOLHDL 3.2 12/10/2021       Latest Ref Rng & Units 12/10/2021   10:04 AM 02/24/2021   12:01 PM 11/05/2020    9:57 AM  Hepatic Function  Total Protein 6.1 - 8.1 g/dL 6.8  7.3  7.3   Albumin 3.5 - 5.0 g/dL  4.1    AST 10 - 35 U/L _0 ALT 9 - 46 U/L _1 Alk Phosphatase 38 - 126 U/L  58    Total Bilirubin 0.2 - 1.2 mg/dL 0.5  0.7  0.6     Lab Results  Component Value Date/Time   TSH 2.856 11/12/2020 11:01 AM   TSH 3.08 12/15/2019 10:35 AM  TSH 1.96 09/11/2019 12:00 AM       Latest Ref Rng & Units 01/15/2022    4:06 PM 12/10/2021   10:04 AM 02/24/2021   12:01 PM  CBC  WBC 3.8 - 10.8 Thousand/uL 3.4  2.3  3.4   Hemoglobin 13.2 - 17.1 g/dL 14.8  14.2  14.5   Hematocrit 38.5 - 50.0 % 43.7  43.7  44.9   Platelets 140 - 400 Thousand/uL 216  231  201     Lab Results  Component Value Date/Time   VD25OH 61 12/15/2019 10:35 AM   VD25OH 31 05/24/2018 10:54 AM    Clinical ASCVD: Yes  The ASCVD Risk score (Arnett DK, et al., 2019) failed to calculate for the following reasons:   The 2019 ASCVD risk score is only valid for ages 74 to 37       06/04/2022    8:31 AM 04/20/2022    11:31 AM 12/10/2021    9:16 AM  Depression screen PHQ 2/9  Decreased Interest 0 0 0  Down, Depressed, Hopeless 0 0 0  PHQ - 2 Score 0 0 0  Altered sleeping 0 0 0  Tired, decreased energy 0 0 0  Change in appetite 0 0 0  Feeling bad or failure about yourself  0 0 0  Trouble concentrating 0 0 0  Moving slowly or fidgety/restless 0 0 0  Suicidal thoughts 0 0 0  PHQ-9 Score 0 0 0    Social History   Tobacco Use  Smoking Status Never  Smokeless Tobacco Never  Tobacco Comments   smoking cessation materials not required   BP Readings from Last 3 Encounters:  05/22/22 119/64  04/20/22 128/74  02/12/22 (!) 160/56   Pulse Readings from Last 3 Encounters:  05/22/22 69  04/20/22 65  02/12/22 65   Wt Readings from Last 3 Encounters:  05/22/22 130 lb (59 kg)  04/20/22 129 lb (58.5 kg)  02/12/22 126 lb (57.2 kg)    Assessment/Interventions: Review of patient past medical history, allergies, medications, health status, including review of consultants reports, laboratory and other test data, was performed as part of comprehensive evaluation and provision of chronic care management services.   SDOH:  (Social Determinants of Health) assessments and interventions performed: Yes SDOH Interventions    Flowsheet Row Chronic Care Management from 07/08/2022 in [place] from 06/04/2022 in [place] Management from 01/07/2022 in [place] from 06/03/2021 in [place] Management from 01/02/2021 in [facility]  SDOH Interventions       Food Insecurity Interventions -- Intervention Not Indicated -- Intervention Not Indicated --  Housing Interventions -- Intervention Not Indicated -- Intervention Not Indicated --  Transportation Interventions -- Intervention Not Indicated -- Intervention Not Indicated --  Financial Strain Interventions  _0   Physical Activity Interventions -- Intervention Not Indicated -- Intervention Not Indicated --  Stress Interventions -- Intervention Not Indicated -- Intervention Not Indicated --  Social Connections Interventions -- -- -- Intervention Not Indicated --       CCM Care Plan  Allergies  Allergen Reactions   Colesevelam Other (See Comments)   Penicillins Itching and Other (See Comments)   Statins Other (See Comments)    myalgia   Welchol [Colesevelam Hcl]     Medications Reviewed Today     Reviewed  by Royal Hawthorn, CMA (Certified Medical Assistant) on 06/04/22 at [place] List Status: <None>   Medication Order Taking? Sig Documenting Provider Last Dose Status Informant  Accu-Chek Softclix Lancets lancets [id number]  Use 1 lancet twice (2) daily to check blood glucose [name], MD  Active   aspirin 81 MG tablet [id number]  Take 81 mg by mouth daily.  [provider]  Active Self  Blood Glucose Monitoring Suppl (ACCU-CHEK GUIDE ME) w/Device KIT [id number]   [provider]  Active   brimonidine-timolol (COMBIGAN) 0.2-0.5 % ophthalmic solution [id number]  Place 1 drop into both eyes every 12 (twelve) hours. [provider]  Active Self  Cholecalciferol (VITAMIN D) 50 MCG (2000 UT) CAPS [id number]  Take 1 capsule (2,000 Units total) by mouth daily. [name], MD  Active   clotrimazole-betamethasone Donalynn Furlong) cream [id number]  Apply 1 application. topically at bedtime. Gardiner Barefoot, DPM  Active   clotrimazole-betamethasone (LOTRISONE) lotion [id number]  Apply 1 each topically 2 (two) times daily. [provider]  Active   Cyanocobalamin (B-12) 1000 MCG SUBL [id number]  Place 1 tablet under the tongue daily. [name], MD  Active   docusate sodium (COLACE) 100 MG capsule [id number]  Take 100 mg by mouth  daily. [provider]  Active   dorzolamide (TRUSOPT) 2 % ophthalmic solution [id number]  1 drop 2 (two) times daily.  [provider]  Active Self           Med Note Despina Hidden   Fri Apr 26, 2020  9:38 AM)    ezetimibe (ZETIA) 10 MG tablet [id number]  TAKE 1 TABLET BY MOUTH ONCE DAILY [name], MD  Active   Garlic 10 MG CAPS [id number]  Take 15 mg by mouth daily.  [provider]  Active Self           Med Note Tamala Julian, Valentino Nose   Fri Apr 26, 2020  9:38 AM)    glucose blood (ACCU-CHEK GUIDE) test strip [id number]  USE AS DIRECTED. 4 TIMES DAILY [name], MD  Active   icosapent Ethyl (VASCEPA) 1 g capsule [id number]  Take 2 capsules (2 g total) by mouth 2 (two) times daily. [name], MD  Active   ketoconazole (NIZORAL) 2 % cream [id number]  SMARTSIG:1 Application Topical 1 to 2 Times Daily [provider]  Active            Med Note Clemetine Marker D   Tue Jun 03, 2021  9:31 AM) PRN only  latanoprost (XALATAN) 0.005 % ophthalmic solution [id number]  Place 1 drop into both eyes at bedtime.  [provider]  Active Self  Magnesium 100 MG TABS [id number]  Take by mouth.  [provider]  Active   metFORMIN (GLUCOPHAGE-XR) 500 MG 24 hr tablet [id number]  Take 2 tablets (1,000 mg total) by mouth daily with breakfast. [name], MD  Active   Multiple Vitamin (MULTIVITAMIN) tablet [id number]  Take 1 tablet by mouth daily. [provider]  Active Self  Omega-3 Fatty Acids (FISH OIL) 500 MG CAPS [id number]   [provider]  Active   pioglitazone (ACTOS) 15 MG tablet [id number]  Take 1 tablet (15 mg total) by mouth daily. [name], MD  Active   polyethylene glycol powder (GLYCOLAX/MIRALAX) 17 GM/SCOOP powder [id number]  Take 17 g by mouth daily. [name], MD  Active   sildenafil (REVATIO) 20 MG tablet [id number]  Take 1-5 tablets one hour prior to intercourse  as needed. Zara Council A, PA-C   Active   triamcinolone (KENALOG) 0.025 % cream [id number]  Apply topically. [provider]  Active   vitamin C (ASCORBIC ACID) 500 MG tablet [id number]  Take 500 mg by mouth 2 (two) times daily. Chewable [provider]  Active Self  Zinc Acetate 50 MG CAPS [id number]  Take 1 capsule by mouth daily.  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 07/30/2016   Long term current use of systemic steroids 01/07/2016   Other neutropenia ([place]) 01/07/2016   Carotid artery narrowing 04/08/2015   Chronic constipation 04/08/2015   Brain syndrome, 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   Failure of erection 04/08/2015   H/O inguinal hernia repair 04/08/2015   Hyperlipidemia associated with type 2 diabetes mellitus ([place]) 04/08/2015   LBP (low back pain) 04/08/2015   MI (mitral incompetence) 04/08/2015   Kidney lump 04/08/2015   TI (tricuspid incompetence) 04/08/2015   Unilateral recurrent femoral hernia without obstruction or gangrene 05/23/2014   Femoral hernia 05/23/2014   Right inguinal hernia 02/06/2014   Cataract 01/16/2014   Glaucoma 01/16/2014   Reflux esophagitis 06/21/2009   Atherosclerosis of native coronary artery of native heart without angina pectoris 04/03/2009   Benign enlargement of prostate 02/11/2009   Decreased libido 01/12/2008   Benign essential HTN 09/12/2007    Immunization History  Administered Date(s) Administered   Fluad  Quad(high Dose 65+) 08/01/2019, 08/19/2020, 07/22/2021   Influenza Split 07/02/2010   Influenza, High Dose Seasonal PF 08/14/2015, 07/30/2016, 08/19/2017, 08/09/2018   Influenza, Seasonal, Injecte, Preservative Fre 07/08/2011, 07/22/2012   Influenza,inj,Quad PF,6+ Mos 07/11/2013   PFIZER(Purple Top)SARS-COV-2 Vaccination 11/22/2019, 12/16/2019, 08/04/2020, 01/30/2021, 07/14/2021   PNEUMOCOCCAL CONJUGATE-20 04/20/2022   Pneumococcal Conjugate-13 01/22/2014   Pneumococcal-Unspecified 01/03/2008   Tdap 11/04/2010, 11/05/2020   Zoster Recombinat (Shingrix) 06/25/2020, 10/03/2020   Zoster, Live 06/14/2008    Conditions to be addressed/monitored:  Hypertension, Hyperlipidemia, Diabetes and Coronary Artery Disease  Care Plan : General Pharmacy (Adult)  Updates made by Germaine Pomfret, RPH since 07/08/2022 12:00 AM     Problem: Hypertension, Hyperlipidemia, Diabetes and Coronary Artery Disease   Priority: High     Long-Range Goal: Patient-Specific Goal   Start Date: 01/02/2021  Expected End Date: 01/07/2022  This Visit's Progress: On track  Recent Progress: On track  Priority: High  Note:   Current Barriers:  No barriers noted  Pharmacist Clinical Goal(s):  Over the next 90 days, patient will maintain control of diabetes as evidenced by A1c less than 8%  through collaboration with PharmD and provider.   Interventions: 1:1 collaboration with [name], MD regarding development and update of comprehensive plan of care as evidenced by provider attestation and co-signature Inter-disciplinary care team collaboration (see longitudinal plan of care) Comprehensive medication review performed; medication list updated in electronic medical record  Hypertension (BP goal <140/90) -Controlled -Current treatment: None -Medications previously tried: Ramipril, Imdur, Metoprolol,   -Current home readings: NA -  Current dietary habits: Patient trying to eat healthy, minimizing salt intake,  watching sweets and fatty foods. Patient has had significant unexplained weight loss over past 3 years, stable recently. -Current exercise habits: exercise at gym 3-4 times weekly (treadmill, bike for 40-45 minutes)  -Denies hypotensive/hypertensive symptoms -Educated on Daily salt intake goal < 2300 mg; Exercise goal of 150 minutes per week; -Counseled to monitor BP at home weekly, document, and provide log at future appointments -Recommended to continue current medication  Hyperlipidemia: (LDL goal < 70) -Uncontrolled -Current treatment: Ezetimibe 10 mg daily: Appropriate, Effective, Safe, Accessible Vascepa 1 g 2 caps twice daily: Appropriate, Effective, Safe, Accessible  -Current antiplatelet treatment: Aspirin 81 mg daily  -Medications previously tried: "statins" colesevelam   -Recommended to continue current medication  Diabetes (A1c goal <8%) -Controlled -Current medications: Metformin XR 500 mg 2 tablets daily: Appropriate, Effective, Safe, Accessible   Pioglitazone 15 mg daily: Appropriate, Effective, Safe, Accessible  -Medications previously tried: glipizide  -Current home glucose readings fasting glucose: 106-112 post prandial glucose: NA -Exercises at the gym four times weekly. Bike, treadmill, and weight training.  -Denies hypoglycemic/hyperglycemic symptoms -Recommended to continue current medication  Patient Goals/Self-Care Activities Over the next 90 days, patient will:  - check glucose daily before breakfast, document, and provide at future appointments check blood pressure weekly, document, and provide at future appointments  Follow Up Plan: No further follow up required: Patient will reach out to clinical team for further questions.      Medication Assistance: None required.  Patient affirms current coverage meets needs.  Patient's preferred pharmacy is:  [facility], [place] [address] Phone:  [phone] Fax: [facility] [phone], [place] [address] [place] [address] Phone: [phone] Fax: [phone]  [facility] [place], [place] [facility] [address] [place] [address] Phone: [phone] Fax: [phone]  Uses pill box? Yes Pt endorses 100% compliance  We discussed: Current pharmacy is preferred with insurance plan and patient is satisfied with pharmacy services Patient decided to: Continue current medication management strategy  Care Plan and Follow Up Patient Decision:  Patient agrees to Care Plan and Follow-up.  Plan: No further follow up required: Patient will reach out to clinical team for further questions.  Malva Limes, [place] Pharmacist Practitioner  [facility] [phone]

## 2022-07-08 NOTE — Patient Instructions (Signed)
Visit Information It was great speaking with you today!  Please let me know if you have any questions about our visit.   Goals Addressed             This Visit's Progress    Monitor and Manage My Blood Sugar-Diabetes Type 2   On track    Timeframe:  Long-Range Goal Priority:  High Start Date:    01/02/21                         Expected End Date:  01/08/23                     Follow up within 90 days   -check blood sugar daily before breakfast - check blood sugar if I feel it is too high or too low - enter blood sugar readings and medication or insulin into daily log    Why is this important?   Checking your blood sugar at home helps to keep it from getting very high or very low.  Writing the results in a diary or log helps the doctor know how to care for you.  Your blood sugar log should have the time, date and the results.  Also, write down the amount of insulin or other medicine that you take.  Other information, like what you ate, exercise done and how you were feeling, will also be helpful.     Notes:         Patient Care Plan: General Pharmacy (Adult)     Problem Identified: Hypertension, Hyperlipidemia, Diabetes and Coronary Artery Disease   Priority: High     Long-Range Goal: Patient-Specific Goal   Start Date: 01/02/2021  Expected End Date: 01/07/2022  This Visit's Progress: On track  Recent Progress: On track  Priority: High  Note:   Current Barriers:  No barriers noted  Pharmacist Clinical Goal(s):  Over the next 90 days, patient will maintain control of diabetes as evidenced by A1c less than 8%  through collaboration with PharmD and provider.   Interventions: 1:1 collaboration with [name], MD regarding development and update of comprehensive plan of care as evidenced by provider attestation and co-signature Inter-disciplinary care team collaboration (see longitudinal plan of care) Comprehensive medication review performed; medication list updated  in electronic medical record  Hypertension (BP goal <140/90) -Controlled -Current treatment: None -Medications previously tried: Ramipril, Imdur, Metoprolol,   -Current home readings: NA -Current dietary habits: Patient trying to eat healthy, minimizing salt intake, watching sweets and fatty foods. Patient has had significant unexplained weight loss over past 3 years, stable recently. -Current exercise habits: exercise at gym 3-4 times weekly (treadmill, bike for 40-45 minutes)  -Denies hypotensive/hypertensive symptoms -Educated on Daily salt intake goal < 2300 mg; Exercise goal of 150 minutes per week; -Counseled to monitor BP at home weekly, document, and provide log at future appointments -Recommended to continue current medication  Hyperlipidemia: (LDL goal < 70) -Uncontrolled -Current treatment: Ezetimibe 10 mg daily: Appropriate, Effective, Safe, Accessible Vascepa 1 g 2 caps twice daily: Appropriate, Effective, Safe, Accessible  -Current antiplatelet treatment: Aspirin 81 mg daily  -Medications previously tried: "statins" colesevelam   -Recommended to continue current medication  Diabetes (A1c goal <8%) -Controlled -Current medications: Metformin XR 500 mg 2 tablets daily: Appropriate, Effective, Safe, Accessible   Pioglitazone 15 mg daily: Appropriate, Effective, Safe, Accessible  -Medications previously tried: glipizide  -Current home glucose readings fasting glucose: 106-112 post prandial glucose:  NA -Exercises at the gym four times weekly. Bike, treadmill, and weight training.  -Denies hypoglycemic/hyperglycemic symptoms -Recommended to continue current medication  Patient Goals/Self-Care Activities Over the next 90 days, patient will:  - check glucose daily before breakfast, document, and provide at future appointments check blood pressure weekly, document, and provide at future appointments  Follow Up Plan: No further follow up required: Patient will reach  out to clinical team for further questions.    Patient agreed to services and verbal consent obtained.   The patient verbalized understanding of instructions, educational materials, and care plan provided today and DECLINED offer to receive copy of patient instructions, educational materials, and care plan.   Malva Limes, [place] Pharmacist Practitioner  [facility] [phone]

## 2022-08-01 DIAGNOSIS — I10 Essential (primary) hypertension: Secondary | ICD-10-CM

## 2022-08-01 DIAGNOSIS — Z794 Long term (current) use of insulin: Secondary | ICD-10-CM

## 2022-08-01 DIAGNOSIS — E785 Hyperlipidemia, unspecified: Secondary | ICD-10-CM

## 2022-08-01 DIAGNOSIS — E1159 Type 2 diabetes mellitus with other circulatory complications: Secondary | ICD-10-CM | POA: Diagnosis not present

## 2022-08-19 ENCOUNTER — Other Ambulatory Visit: Payer: Self-pay | Admitting: Family Medicine

## 2022-08-19 DIAGNOSIS — E1140 Type 2 diabetes mellitus with diabetic neuropathy, unspecified: Secondary | ICD-10-CM

## 2022-08-19 NOTE — Progress Notes (Unsigned)
Name: Austin Hunt   MRN: [id number]    DOB: November 26, 1940   Date:08/20/2022       Progress Note  Subjective  Chief Complaint  Follow Up  HPI  DMII: A1C has improved he is now on Metformin 1000 mg daily and Actos, however continues to lose weight and A1C is at goal, we will decrease dose of metformin to 750 mg ER daily . He has associated neuropathy - described as feet pruritis but intermittent and does not want to take medication for that he also has dyslipidemia. He denies polyphagia, polydipsia or polyphagia. Eye exam is up to date.  Malnutrition:  He used to be over 150 lbs, gradually losing weight, pants falling down, temporal waisting, states appetite is good , he eats oatmeal for breakfast, goes to the gym and usually skips lunch or has a small snack ( he states not hungry mid day), he has dinner at home that is usually a full course meal. He has been taking Glucerna once a day after breakfast. Explained importance of eating lunch to gain weight   Other neutropenia  : currently under the care of Dr. Tasia Catchings and reassuring labs and was released from her care in April 2022 but we have been monitoring it yearly. Last level was 3.4    BPH and prostate cancer: he is seeing Urologist and denies any obstructive symptoms, except for nocturia about 1-  2 times per night.  Incidental finding of prostate cancer during TURP back in 2015 , last PSA was done June 2021 and it was 2.12 , he is now on yearly follow up with  Urologist  and last PSA 1.04 February 2022  History of HTN : patient is off medication, no chest pain or palpitation, bp is towards low end of normal but no dizziness    Dyslipidemia: he is not on statin therapy, myalgia in the past,  last LDl was 120 and stable, currently on Zetia and Lovaza . We will recheck labs next visit    Chronic constipation: He has increased water intake ,he stopped taking prune juice due to diabetes and is now having bowel movements a few times a week. He also stopped  taking miralax also. He has been taking an herbal prn. No straining. Consider resuming miralax    CAD  He had a stress test done by Dr. Clayborn Bigness for evaluation of chest pain back in 11/2018 and results were normal . Last visit was 10/2021 He states symptoms resolved . He states chest pain under control with medications, he had a stent placed 2004. Doing well at this time    May 9 th 2022  NORMAL LEFT VENTRICULAR SYSTOLIC FUNCTION  NORMAL RIGHT VENTRICULAR SYSTOLIC FUNCTION  MILD VALVULAR REGURGITATION (See above)  NO VALVULAR STENOSIS  ESTIMATED LVEF >55%      11/2018:  Normal myocardial perfusion scan no evidence of stress-induced  myocardial ischemia ejection fraction of 64% conclusion negative scan.   Low risk scan   Recurrent rash on groin: seen by Dr. 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 07/30/2016   Long term current use of systemic steroids 01/07/2016   Other neutropenia (HCC) 01/07/2016   Carotid artery narrowing 04/08/2015   Chronic constipation 04/08/2015   Brain syndrome, 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   Failure of erection 04/08/2015   H/O inguinal hernia repair 04/08/2015   Hyperlipidemia associated with type 2 diabetes mellitus (HCC) 04/08/2015   LBP (low back pain) 04/08/2015   MI (mitral incompetence) 04/08/2015   Kidney lump 04/08/2015   TI (tricuspid incompetence) 04/08/2015   Unilateral recurrent femoral  hernia without obstruction or gangrene 05/23/2014   Femoral hernia 05/23/2014   Right inguinal hernia 02/06/2014   Cataract 01/16/2014   Glaucoma 01/16/2014   Reflux esophagitis 06/21/2009   Atherosclerosis of native coronary artery of native heart without angina pectoris 04/03/2009   Benign enlargement of prostate 02/11/2009   Decreased libido 01/12/2008   Benign essential HTN 09/12/2007    Past Surgical History:  Procedure Laterality Date   APPENDECTOMY  1954   BLADDER SURGERY  Oct 2015   CARDIAC CATHETERIZATION  2002   1 stent   COLONOSCOPY     EYE SURGERY  2011, 2013   cataracts   EYE SURGERY Right May 2016   glaucoma   HERNIA REPAIR Left 1967   HERNIA REPAIR Right 05-10-14   Right femoral hernia repair Dr Byrnett with PerFix plug, no onlay mesh..    PHOTOCOAGULATION WITH LASER Right 12/30/2015   Procedure: PHOTOCOAGULATION WITH LASER;  Surgeon: Anita Prakash Vin-Parikh, MD;  Location: [facility];  Service: Ophthalmology;  Laterality: Right;  DIABETIC - oral meds IVA BLOCK   PROSTATE SURGERY  Oct 2015    Family History  Problem Relation Age of Onset   Diabetes Sister    Diabetes Brother    Diabetes Sister    Prostate cancer Neg Hx    Bladder Cancer Neg Hx     Social History   Tobacco Use   Smoking status: Never   Smokeless tobacco: Never   Tobacco comments:    smoking cessation materials not required  Substance Use Topics   Alcohol use: No     Current Outpatient Medications:    Accu-Chek Softclix Lancets lancets, Use 1 lancet twice (2) daily to check blood glucose, Disp: 100 each, Rfl: 5   aspirin 81 MG tablet, Take 81 mg by mouth daily. , Disp: , Rfl:    Blood Glucose Monitoring Suppl (ACCU-CHEK GUIDE ME) w/Device KIT, , Disp: , Rfl:    brimonidine-timolol (COMBIGAN) 0.2-0.5 % ophthalmic solution, Place 1 drop into both eyes every 12 (twelve) hours., Disp: , Rfl:    Cholecalciferol (VITAMIN D) 50 MCG (2000 UT) CAPS, Take 1 capsule (2,000 Units  total) by mouth daily., Disp: 30 capsule, Rfl: 0   clotrimazole-betamethasone (LOTRISONE) cream, Apply 1 application. topically at bedtime., Disp: 30 g, Rfl: 0   clotrimazole-betamethasone (LOTRISONE) lotion, Apply 1 each topically 2 (two) times daily., Disp: , Rfl:    Cyanocobalamin (B-12) 1000 MCG SUBL, Place 1 tablet under the tongue daily., Disp: 30 tablet, Rfl: 0   docusate sodium (COLACE) 100 MG capsule, Take 100 mg by mouth daily., Disp: , Rfl:    dorzolamide (TRUSOPT) 2 % ophthalmic solution, 1 drop 2 (two) times daily. , Disp: , Rfl:    ezetimibe (ZETIA) 10 MG tablet, TAKE 1 TABLET BY MOUTH ONCE DAILY, Disp: 90 tablet, Rfl: 1   Garlic 10 MG CAPS,   Take 15 mg by mouth daily. , Disp: , Rfl:    glucose blood (ACCU-CHEK GUIDE) test strip, USE AS DIRECTED. 4 TIMES DAILY, Disp: 100 each, Rfl: 10   ketoconazole (NIZORAL) 2 % cream, SMARTSIG:1 Application Topical 1 to 2 Times Daily, Disp: , Rfl:    latanoprost (XALATAN) 0.005 % ophthalmic solution, Place 1 drop into both eyes at bedtime. , Disp: , Rfl:    Magnesium 100 MG TABS, Take by mouth. , Disp: , Rfl:    metFORMIN (GLUCOPHAGE-XR) 500 MG 24 hr tablet, Take 2 tablets (1,000 mg total) by mouth daily with breakfast., Disp: 180 tablet, Rfl: 1   Multiple Vitamin (MULTIVITAMIN) tablet, Take 1 tablet by mouth daily., Disp: , Rfl:    pioglitazone (ACTOS) 15 MG tablet, Take 1 tablet (15 mg total) by mouth daily., Disp: 90 tablet, Rfl: 1   polyethylene glycol powder (GLYCOLAX/MIRALAX) 17 GM/SCOOP powder, Take 17 g by mouth daily., Disp: 3350 g, Rfl: 1   sildenafil (REVATIO) 20 MG tablet, Take 1-5 tablets one hour prior to intercourse as needed., Disp: 30 tablet, Rfl: 6   triamcinolone (KENALOG) 0.025 % cream, Apply topically., Disp: , Rfl:    VASCEPA 1 g capsule, TAKE 2 CAPSULES BY MOUTH TWICE DAILY AS DIRECTED, Disp: 120 capsule, Rfl: 5   vitamin C (ASCORBIC ACID) 500 MG tablet, Take 500 mg by mouth 2 (two) times daily. Chewable, Disp: , Rfl:    Zinc  Acetate 50 MG CAPS, Take 1 capsule by mouth daily. , Disp: , Rfl:   Allergies  Allergen Reactions   Colesevelam Other (See Comments)   Penicillins Itching and Other (See Comments)   Statins Other (See Comments)    myalgia   Welchol [Colesevelam Hcl]     I personally reviewed active problem list, medication list, allergies, family history, social history, health maintenance with the patient/caregiver today.   ROS  Constitutional: Negative for fever or weight change.  Respiratory: Negative for cough and shortness of breath.   Cardiovascular: Negative for chest pain or palpitations.  Gastrointestinal: Negative for abdominal pain, no bowel changes.  Musculoskeletal: Negative for gait problem or joint swelling.  Skin: Negative for rash.  Neurological: Negative for dizziness or headache.  No other specific complaints in a complete review of systems (except as listed in HPI above).   Objective  Vitals:   08/20/22 1000  BP: 118/66  Pulse: 79  Resp: 16  SpO2: 99%  Weight: 128 lb (58.1 kg)  Height: 5' 9" (1.753 m)    Body mass index is 18.9 kg/m.  Physical Exam  Constitutional: Patient appears well-developed and has temporal waisting No distress.  HEENT: head atraumatic, normocephalic, pupils equal and reactive to light, neck supple,  Cardiovascular: Normal rate, regular rhythm and normal heart sounds.  No murmur heard. No BLE edema. Pulmonary/Chest: Effort normal and breath sounds normal. No respiratory distress. Abdominal: Soft.  There is no tenderness. Psychiatric: Patient has a normal mood and affect. behavior is normal. Judgment and thought content normal.   Recent Results (from the past 2160 hour(s))  POCT HgB A1C     Status: Abnormal   Collection Time: 08/20/22 10:15 AM  Result Value Ref Range   Hemoglobin A1C 6.3 (A) 4.0 - 5.6 %   HbA1c POC (<> result, manual entry)     HbA1c, POC (prediabetic range)     HbA1c, POC (controlled diabetic range)        PHQ2/9:    08/20/2022   10:11 AM 06/04/2022      8:31 AM 04/20/2022   11:31 AM 12/10/2021    9:16 AM 10/29/2021    9:28 AM  Depression screen PHQ 2/9  Decreased Interest 0 0 0 0 0  Down, Depressed, Hopeless 0 0 0 0 0  PHQ - 2 Score 0 0 0 0 0  Altered sleeping 1 0 0 0 0  Tired, decreased energy 0 0 0 0 0  Change in appetite 0 0 0 0 0  Feeling bad or failure about yourself  0 0 0 0 0  Trouble concentrating 0 0 0 0 0  Moving slowly or fidgety/restless 0 0 0 0 0  Suicidal thoughts 0 0 0 0 0  PHQ-9 Score 1 0 0 0 0    phq 9 is negative   Fall Risk:    08/20/2022   10:11 AM 06/04/2022    8:33 AM 04/20/2022   11:31 AM 12/10/2021    9:15 AM 10/29/2021    9:27 AM  Fall Risk   Falls in the past year? 0 0 0 0 1  Number falls in past yr: 0   0 0  Injury with Fall? 0  0 0 0  Risk for fall due to : No Fall Risks No Fall Risks No Fall Risks No Fall Risks No Fall Risks  Follow up Falls prevention discussed Education provided;Falls evaluation completed Falls prevention discussed Falls prevention discussed Falls prevention discussed      Functional Status Survey: Is the patient deaf or have difficulty hearing?: No Does the patient have difficulty seeing, even when wearing glasses/contacts?: No Does the patient have difficulty concentrating, remembering, or making decisions?: No Does the patient have difficulty walking or climbing stairs?: No Does the patient have difficulty dressing or bathing?: No Does the patient have difficulty doing errands alone such as visiting a doctor's office or shopping?: No    Assessment & Plan  1. Type 2 diabetes, controlled, with peripheral neuropathy (HCC)  - POCT HgB A1C - pioglitazone (ACTOS) 15 MG tablet; Take 1 tablet (15 mg total) by mouth daily.  Dispense: 90 tablet; Refill: 1 - metFORMIN (GLUCOPHAGE-XR) 750 MG 24 hr tablet; Take 1 tablet (750 mg total) by mouth daily with breakfast.  Dispense: 90 tablet; Refill: 1  2. Other neutropenia  (HCC)  Recheck next year   3. 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  Dispense: 90 tablet; Refill: 1 - metFORMIN (GLUCOPHAGE-XR) 750 MG 24 hr tablet; Take 1 tablet (750 mg total) by mouth daily with breakfast.  Dispense: 90 tablet; Refill: 1  4. Need for immunization against influenza  - Flu Vaccine QUAD High Dose(Fluad)  5. Statin myopathy   6. B12 deficiency  Continues supplementation  7. Vitamin D deficiency  Continue supplementation   8. Mild protein-calorie malnutrition (HCC)  Discussed importance of not skipping lunch   9. History of hypertension   10. Chronic idiopathic constipation   Resume miralax 

## 2022-08-20 ENCOUNTER — Encounter: Payer: Self-pay | Admitting: Family Medicine

## 2022-08-20 ENCOUNTER — Ambulatory Visit (INDEPENDENT_AMBULATORY_CARE_PROVIDER_SITE_OTHER): Payer: Medicare Other | Admitting: Family Medicine

## 2022-08-20 VITALS — BP 118/66 | HR 79 | Resp 16 | Ht 69.0 in | Wt 128.0 lb

## 2022-08-20 DIAGNOSIS — E559 Vitamin D deficiency, unspecified: Secondary | ICD-10-CM | POA: Diagnosis not present

## 2022-08-20 DIAGNOSIS — T466X5A Adverse effect of antihyperlipidemic and antiarteriosclerotic drugs, initial encounter: Secondary | ICD-10-CM | POA: Diagnosis not present

## 2022-08-20 DIAGNOSIS — K5904 Chronic idiopathic constipation: Secondary | ICD-10-CM | POA: Diagnosis not present

## 2022-08-20 DIAGNOSIS — Z23 Encounter for immunization: Secondary | ICD-10-CM | POA: Diagnosis not present

## 2022-08-20 DIAGNOSIS — Z8679 Personal history of other diseases of the circulatory system: Secondary | ICD-10-CM

## 2022-08-20 DIAGNOSIS — E538 Deficiency of other specified B group vitamins: Secondary | ICD-10-CM | POA: Diagnosis not present

## 2022-08-20 DIAGNOSIS — G720 Drug-induced myopathy: Secondary | ICD-10-CM | POA: Diagnosis not present

## 2022-08-20 DIAGNOSIS — D708 Other neutropenia: Secondary | ICD-10-CM | POA: Diagnosis not present

## 2022-08-20 DIAGNOSIS — E1139 Type 2 diabetes mellitus with other diabetic ophthalmic complication: Secondary | ICD-10-CM | POA: Diagnosis not present

## 2022-08-20 DIAGNOSIS — H42 Glaucoma in diseases classified elsewhere: Secondary | ICD-10-CM

## 2022-08-20 DIAGNOSIS — I2510 Atherosclerotic heart disease of native coronary artery without angina pectoris: Secondary | ICD-10-CM

## 2022-08-20 DIAGNOSIS — E1136 Type 2 diabetes mellitus with diabetic cataract: Secondary | ICD-10-CM | POA: Diagnosis not present

## 2022-08-20 DIAGNOSIS — E1142 Type 2 diabetes mellitus with diabetic polyneuropathy: Secondary | ICD-10-CM

## 2022-08-20 DIAGNOSIS — E441 Mild protein-calorie malnutrition: Secondary | ICD-10-CM | POA: Diagnosis not present

## 2022-08-20 LAB — POCT GLYCOSYLATED HEMOGLOBIN (HGB A1C): Hemoglobin A1C: 6.3 % — AB (ref 4.0–5.6)

## 2022-08-20 MED ORDER — PIOGLITAZONE HCL 15 MG PO TABS: 15.0000 mg | ORAL_TABLET | Freq: Every day | ORAL | 1 refills | Status: DC

## 2022-08-20 MED ORDER — METFORMIN HCL ER 750 MG PO TB24: 750.0000 mg | ORAL_TABLET | Freq: Every day | ORAL | 1 refills | Status: DC

## 2022-08-20 NOTE — Patient Instructions (Signed)
COVID-19 and RSV after 10/29

## 2022-08-21 DIAGNOSIS — H401133 Primary open-angle glaucoma, bilateral, severe stage: Secondary | ICD-10-CM | POA: Diagnosis not present

## 2022-08-21 DIAGNOSIS — E119 Type 2 diabetes mellitus without complications: Secondary | ICD-10-CM | POA: Diagnosis not present

## 2022-08-21 LAB — HM DIABETES EYE EXAM

## 2022-08-26 DIAGNOSIS — Z23 Encounter for immunization: Secondary | ICD-10-CM | POA: Diagnosis not present

## 2022-08-27 ENCOUNTER — Encounter: Payer: Self-pay | Admitting: Podiatry

## 2022-08-27 ENCOUNTER — Ambulatory Visit (INDEPENDENT_AMBULATORY_CARE_PROVIDER_SITE_OTHER): Payer: Medicare Other | Admitting: Podiatry

## 2022-08-27 DIAGNOSIS — E119 Type 2 diabetes mellitus without complications: Secondary | ICD-10-CM | POA: Diagnosis not present

## 2022-08-27 DIAGNOSIS — M79676 Pain in unspecified toe(s): Secondary | ICD-10-CM | POA: Diagnosis not present

## 2022-08-27 DIAGNOSIS — B351 Tinea unguium: Secondary | ICD-10-CM

## 2022-08-27 DIAGNOSIS — M2010 Hallux valgus (acquired), unspecified foot: Secondary | ICD-10-CM

## 2022-08-27 LAB — HM DIABETES FOOT EXAM: HM Diabetic Foot Exam: ABNORMAL

## 2022-08-27 NOTE — Progress Notes (Signed)
This patient returns to my office for at risk foot care.  This patient requires this care by a professional since this patient will be at risk due to having type 2 diabetes.   This patient is unable to cut nails himself since the patient cannot reach his nails.These nails are painful walking and wearing shoes.  This patient presents for at risk foot care today.  Patient is also have severe nighttime itching.  General Appearance  Alert, conversant and in no acute stress.  Vascular  Dorsalis pedis and posterior tibial  pulses are weakly  palpable  bilaterally.  Capillary return is within normal limits  bilaterally. Cold feet.  Bilaterally. Absent digital hair.  Neurologic  Senn-Weinstein monofilament wire test within normal limits  bilaterally. Muscle power within normal limits bilaterally.  Nails Thick disfigured discolored nails with subungual debris  from hallux to fifth toes bilaterally. No evidence of bacterial infection or drainage bilaterally.  Orthopedic  No limitations of motion  feet .  No crepitus or effusions noted.  No bony pathology or digital deformities noted.  HAV  B/L.  Bony prominence fifth metabase.    Skin  normotropic skin with no porokeratosis noted bilaterally.  No signs of infections or ulcers noted.   Peeling and itching feet  B/L.  Onychomycosis  Pain in right toes  Pain in left toes  Dermatitis  Consent was obtained for treatment procedures.   Mechanical debridement of nails 1-5  bilaterally performed with a nail nipper.  Filed with dremel without incident.  Prescribe lotrisone.    Return office visit   4  months                   Told patient to return for periodic foot care and evaluation due to potential at risk complications.   Gardiner Barefoot DPM

## 2022-10-15 DIAGNOSIS — R0602 Shortness of breath: Secondary | ICD-10-CM | POA: Diagnosis not present

## 2022-10-15 DIAGNOSIS — I2510 Atherosclerotic heart disease of native coronary artery without angina pectoris: Secondary | ICD-10-CM | POA: Diagnosis not present

## 2022-10-15 DIAGNOSIS — R001 Bradycardia, unspecified: Secondary | ICD-10-CM | POA: Diagnosis not present

## 2022-10-15 DIAGNOSIS — I10 Essential (primary) hypertension: Secondary | ICD-10-CM | POA: Diagnosis not present

## 2022-10-15 DIAGNOSIS — E119 Type 2 diabetes mellitus without complications: Secondary | ICD-10-CM | POA: Diagnosis not present

## 2022-10-15 DIAGNOSIS — R0609 Other forms of dyspnea: Secondary | ICD-10-CM | POA: Diagnosis not present

## 2022-10-15 DIAGNOSIS — Z955 Presence of coronary angioplasty implant and graft: Secondary | ICD-10-CM | POA: Diagnosis not present

## 2022-10-15 DIAGNOSIS — E7800 Pure hypercholesterolemia, unspecified: Secondary | ICD-10-CM | POA: Diagnosis not present

## 2022-11-19 NOTE — Progress Notes (Signed)
Name: Austin Hunt   MRN: [id number]    DOB: 03-07-1941   Date:11/20/2022       Progress Note  Subjective  Chief Complaint  Follow Up  HPI  DMII: A1C has improved he is now on Metformin 750 mg daily and  Actos,A1C is at goal at 7.1 %  . He has associated neuropathy - described as feet pruritis but intermittent and does not want to take medication for that he also has dyslipidemia. He sees podiatrist for thick nails, he has PAD and also onychomycosis  He denies polyphagia, polydipsia or polyphagia. Eye exam is up to date.  Malnutrition:  He used to be over 150 lbs, gradually losing weight, pants falling down, temporal waisting, states appetite is good , he eats oatmeal for breakfast and glucerna in am;'s,  goes to the gym and usually skips lunch or has a small snack ( he states not hungry mid day), he has dinner at home that is usually a full course meal.  Explained importance of eating lunch to gain weight . He gained 2 lbs since last visit   Other neutropenia  : currently under the care of Dr. Tasia Catchings and reassuring labs and was released from her care in April 2022 but we have been monitoring it yearly. Last level was 3.4 , we will recheck level   BPH and prostate cancer: he is seeing Urologist and denies any obstructive symptoms, except for nocturia about 1-  2 times per night.  Incidental finding of prostate cancer during TURP back in 2015 , last PSA was done June 2021 and it was 2.12 , he is now on yearly follow up with  Urologist  and last PSA 1.04 February 2022, going back in April   History of HTN : patient is off medication, no chest pain or palpitation, bp is towards low end of normal but no dizziness Unchanged    Dyslipidemia: he is not on statin therapy, myalgia in the past,  last LDl was 120 and stable, currently on Zetia and Lovaza . We will recheck labs today    Chronic constipation: he is doing better since he started taking miralax again , he takes it prn    CAD  He had a stress test  done by Dr. Clayborn Bigness for evaluation of chest pain back in 11/2018 and results were normal . Last visit was 09/2022   He states chest pain under control with medications, he had a stent placed 2004.    May 9 th 2022  NORMAL LEFT VENTRICULAR SYSTOLIC FUNCTION  NORMAL RIGHT VENTRICULAR SYSTOLIC FUNCTION  MILD VALVULAR REGURGITATION (See above)  NO VALVULAR STENOSIS  ESTIMATED LVEF >55%      11/2018:  Normal myocardial perfusion scan no evidence of stress-induced  myocardial ischemia ejection fraction of 64% conclusion negative scan.   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 ([place]) 06/21/2020   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 ([place]) 06/21/2020   Cervical pain (neck) 10/13/2016   Other neutropenia ([place]) 01/07/2016   Carotid artery narrowing 04/08/2015   Chronic constipation 04/08/2015   H/O inguinal hernia repair 04/08/2015   Hyperlipidemia associated with  type 2 diabetes mellitus ([place]) 04/08/2015   LBP (low back pain) 04/08/2015   MI (mitral incompetence) 04/08/2015   TI (tricuspid incompetence) 04/08/2015   Unilateral recurrent femoral hernia without obstruction or gangrene 05/23/2014   Right inguinal hernia 02/06/2014   Cataract 01/16/2014   Glaucoma 01/16/2014   Reflux esophagitis 06/21/2009   Atherosclerosis of native coronary artery of native heart without angina pectoris 04/03/2009   Benign enlargement of prostate 02/11/2009   Decreased libido 01/12/2008    Past Surgical History:  Procedure Laterality Date   [place]  Oct 2015   CARDIAC CATHETERIZATION  2002   1 stent   COLONOSCOPY      EYE SURGERY  2011, 2013   cataracts   EYE SURGERY Right May 2016   glaucoma   HERNIA REPAIR Left 1967   HERNIA REPAIR Right 05-10-14   Right femoral hernia repair Dr Bary Castilla with PerFix plug, no onlay mesh.Marland Kitchen    PHOTOCOAGULATION WITH LASER Right 12/30/2015   Procedure: PHOTOCOAGULATION WITH LASER;  Surgeon: Ronnell Freshwater, MD;  Location: [facility];  Service: Ophthalmology;  Laterality: Right;  DIABETIC - oral meds IVA BLOCK   PROSTATE SURGERY  Oct 2015    Family History  Problem Relation Age of Onset   Diabetes Sister    Diabetes Brother    Diabetes Sister    Prostate cancer Neg Hx    Bladder Cancer Neg Hx     Social History   Tobacco Use   Smoking status: Never   Smokeless tobacco: Never   Tobacco comments:    smoking cessation materials not required  Substance Use Topics   Alcohol use: No     Current Outpatient Medications:    Accu-Chek Softclix Lancets lancets, Use 1 lancet twice (2) daily to check blood glucose, Disp: 100 each, Rfl: 5   aspirin 81 MG tablet, Take 81 mg by mouth daily. , Disp: , Rfl:    Blood Glucose Monitoring Suppl (ACCU-CHEK GUIDE ME) w/Device KIT, , Disp: , Rfl:    brimonidine-timolol (COMBIGAN) 0.2-0.5 % ophthalmic solution, Place 1 drop into both eyes every 12 (twelve) hours., Disp: , Rfl:    Cholecalciferol (VITAMIN D) 50 MCG (2000 UT) CAPS, Take 1 capsule (2,000 Units total) by mouth daily., Disp: 30 capsule, Rfl: 0   clotrimazole-betamethasone (LOTRISONE) cream, Apply 1 application. topically at bedtime., Disp: 30 g, Rfl: 0   clotrimazole-betamethasone (LOTRISONE) lotion, Apply 1 each topically 2 (two) times daily., Disp: , Rfl:    Cyanocobalamin (B-12) 1000 MCG SUBL, Place 1 tablet under the tongue daily., Disp: 30 tablet, Rfl: 0   docusate sodium (COLACE) 100 MG capsule, Take 100 mg by mouth daily., Disp: , Rfl:    dorzolamide (TRUSOPT) 2 % ophthalmic solution, 1 drop 2 (two) times daily. , Disp: , Rfl:    ezetimibe  (ZETIA) 10 MG tablet, TAKE 1 TABLET BY MOUTH ONCE DAILY, Disp: 90 tablet, Rfl: 1   Garlic 10 MG CAPS, Take 15 mg by mouth daily. , Disp: , Rfl:    glucose blood (ACCU-CHEK GUIDE) test strip, USE AS DIRECTED. 4 TIMES DAILY, Disp: 100 each, Rfl: 10   ketoconazole (NIZORAL) 2 % cream, SMARTSIG:1 Application Topical 1 to 2 Times Daily, Disp: , Rfl:    latanoprost (XALATAN) 0.005 % ophthalmic solution, Place 1 drop into both eyes at bedtime. , Disp: , Rfl:    Magnesium 100 MG TABS, Take by mouth. , Disp: , Rfl:    metFORMIN (  GLUCOPHAGE-XR) 750 MG 24 hr tablet, Take 1 tablet (750 mg total) by mouth daily with breakfast., Disp: 90 tablet, Rfl: 1   Multiple Vitamin (MULTIVITAMIN) tablet, Take 1 tablet by mouth daily., Disp: , Rfl:    pioglitazone (ACTOS) 15 MG tablet, Take 1 tablet (15 mg total) by mouth daily., Disp: 90 tablet, Rfl: 1   polyethylene glycol powder (GLYCOLAX/MIRALAX) 17 GM/SCOOP powder, Take 17 g by mouth daily., Disp: 3350 g, Rfl: 1   sildenafil (REVATIO) 20 MG tablet, Take 1-5 tablets one hour prior to intercourse as needed., Disp: 30 tablet, Rfl: 6   triamcinolone (KENALOG) 0.025 % cream, Apply topically., Disp: , Rfl:    VASCEPA 1 g capsule, TAKE 2 CAPSULES BY MOUTH TWICE DAILY AS DIRECTED, Disp: 120 capsule, Rfl: 5   vitamin C (ASCORBIC ACID) 500 MG tablet, Take 500 mg by mouth 2 (two) times daily. Chewable, Disp: , Rfl:    Zinc Acetate 50 MG CAPS, Take 1 capsule by mouth daily. , Disp: , Rfl:   Allergies  Allergen Reactions   Colesevelam Other (See Comments)   Penicillins Itching and Other (See Comments)   Statins Other (See Comments)    myalgia   Welchol [Colesevelam Hcl]     I personally reviewed active problem list, medication list, allergies, family history, social history, health maintenance with the patient/caregiver today.   ROS  Ten systems reviewed and is negative except as mentioned in HPI    Objective  Vitals:   11/20/22 0951  BP: 122/68  Pulse: 72   Resp: 16  SpO2: 95%  Weight: 130 lb (59 kg)  Height: '5\' 9"'$  (1.753 m)    Body mass index is 19.2 kg/m.  Physical Exam  Constitutional: Patient appears well-developed and malnourished / temporal waisting  No distress.  HEENT: head atraumatic, normocephalic, pupils equal and reactive to light,  neck supple Cardiovascular: Normal rate, regular rhythm and normal heart sounds.  No murmur heard. No BLE edema. Pulmonary/Chest: Effort normal and breath sounds normal. No respiratory distress. Abdominal: Soft.  There is no tenderness. Psychiatric: Patient has a normal mood and affect. behavior is normal. Judgment and thought content normal.   Recent Results (from the past 2160 hour(s))  POCT HgB A1C     Status: Abnormal   Collection Time: 11/20/22  9:53 AM  Result Value Ref Range   Hemoglobin A1C 7.3 (A) 4.0 - 5.6 %   HbA1c POC (<> result, manual entry)     HbA1c, POC (prediabetic range)     HbA1c, POC (controlled diabetic range)        PHQ2/9:    11/20/2022    9:52 AM 08/20/2022   10:11 AM 06/04/2022    8:31 AM 04/20/2022   11:31 AM 12/10/2021    9:16 AM  Depression screen PHQ 2/9  Decreased Interest 0 0 0 0 0  Down, Depressed, Hopeless 0 0 0 0 0  PHQ - 2 Score 0 0 0 0 0  Altered sleeping 0 1 0 0 0  Tired, decreased energy 0 0 0 0 0  Change in appetite 0 0 0 0 0  Feeling bad or failure about yourself  0 0 0 0 0  Trouble concentrating 0 0 0 0 0  Moving slowly or fidgety/restless 0 0 0 0 0  Suicidal thoughts 0 0 0 0 0  PHQ-9 Score 0 1 0 0 0    phq 9 is negative   Fall Risk:    11/20/2022    9:52  AM 08/20/2022   10:11 AM 06/04/2022    8:33 AM 04/20/2022   11:31 AM 12/10/2021    9:15 AM  Fall Risk   Falls in the past year? 0 0 0 0 0  Number falls in past yr: 0 0   0  Injury with Fall? 0 0  0 0  Risk for fall due to : No Fall Risks No Fall Risks No Fall Risks No Fall Risks No Fall Risks  Follow up Falls prevention discussed Falls prevention discussed Education provided;Falls  evaluation completed Falls prevention discussed Falls prevention discussed      Functional Status Survey: Is the patient deaf or have difficulty hearing?: No Does the patient have difficulty seeing, even when wearing glasses/contacts?: No Does the patient have difficulty concentrating, remembering, or making decisions?: No Does the patient have difficulty walking or climbing stairs?: No Does the patient have difficulty dressing or bathing?: No Does the patient have difficulty doing errands alone such as visiting a doctor's office or shopping?: No    Assessment & Plan  1. Type 2 diabetes, controlled, with peripheral neuropathy (HCC)  - POCT HgB A1C - Lipid panel - COMPLETE METABOLIC PANEL WITH GFR - CBC with Differential/Platelet - Microalbumin / creatinine urine ratio  2. Hyperlipidemia associated with type 2 diabetes mellitus (HCC)  - icosapent Ethyl (VASCEPA) 1 g capsule; TAKE 2 CAPSULES BY MOUTH TWICE DAILY AS DIRECTED  Dispense: 120 capsule; Refill: 5  3. Other neutropenia (HCC)  - CBC with Differential/Platelet  4. Mild protein-calorie malnutrition ([place])  Discussed importance of gaining weight again   5. Vitamin D deficiency  - VITAMIN D 25 Hydroxy (Vit-D Deficiency, Fractures)  6. B12 deficiency  - B12 and Folate Panel  7. Statin myopathy   8. History of prostate cancer   9. Dyslipidemia  - ezetimibe (ZETIA) 10 MG tablet; Take 1 tablet (10 mg total) by mouth daily.  Dispense: 90 tablet; Refill: 1  10. Atherosclerosis of native coronary artery of native heart without angina pectoris  On Zetia   11. Chronic idiopathic constipation

## 2022-11-20 ENCOUNTER — Encounter: Payer: Self-pay | Admitting: Family Medicine

## 2022-11-20 ENCOUNTER — Ambulatory Visit (INDEPENDENT_AMBULATORY_CARE_PROVIDER_SITE_OTHER): Payer: Medicare Other | Admitting: Family Medicine

## 2022-11-20 VITALS — BP 122/68 | HR 72 | Resp 16 | Ht 69.0 in | Wt 130.0 lb

## 2022-11-20 DIAGNOSIS — D708 Other neutropenia: Secondary | ICD-10-CM | POA: Diagnosis not present

## 2022-11-20 DIAGNOSIS — E1142 Type 2 diabetes mellitus with diabetic polyneuropathy: Secondary | ICD-10-CM | POA: Diagnosis not present

## 2022-11-20 DIAGNOSIS — E1169 Type 2 diabetes mellitus with other specified complication: Secondary | ICD-10-CM

## 2022-11-20 DIAGNOSIS — Z8546 Personal history of malignant neoplasm of prostate: Secondary | ICD-10-CM | POA: Diagnosis not present

## 2022-11-20 DIAGNOSIS — E538 Deficiency of other specified B group vitamins: Secondary | ICD-10-CM

## 2022-11-20 DIAGNOSIS — I2510 Atherosclerotic heart disease of native coronary artery without angina pectoris: Secondary | ICD-10-CM | POA: Diagnosis not present

## 2022-11-20 DIAGNOSIS — E441 Mild protein-calorie malnutrition: Secondary | ICD-10-CM | POA: Diagnosis not present

## 2022-11-20 DIAGNOSIS — G720 Drug-induced myopathy: Secondary | ICD-10-CM | POA: Diagnosis not present

## 2022-11-20 DIAGNOSIS — K5904 Chronic idiopathic constipation: Secondary | ICD-10-CM | POA: Diagnosis not present

## 2022-11-20 DIAGNOSIS — E559 Vitamin D deficiency, unspecified: Secondary | ICD-10-CM | POA: Diagnosis not present

## 2022-11-20 DIAGNOSIS — E785 Hyperlipidemia, unspecified: Secondary | ICD-10-CM

## 2022-11-20 DIAGNOSIS — T466X5A Adverse effect of antihyperlipidemic and antiarteriosclerotic drugs, initial encounter: Secondary | ICD-10-CM

## 2022-11-20 LAB — POCT GLYCOSYLATED HEMOGLOBIN (HGB A1C): Hemoglobin A1C: 7.3 % — AB (ref 4.0–5.6)

## 2022-11-20 MED ORDER — EZETIMIBE 10 MG PO TABS: 10.0000 mg | ORAL_TABLET | Freq: Every day | ORAL | 1 refills | Status: DC

## 2022-11-20 MED ORDER — ICOSAPENT ETHYL 1 G PO CAPS: ORAL_CAPSULE | ORAL | 5 refills | Status: DC

## 2022-11-21 LAB — COMPLETE METABOLIC PANEL WITH GFR
AG Ratio: 1.6 (calc) (ref 1.0–2.5)
ALT: 15 U/L (ref 9–46)
AST: 24 U/L (ref 10–35)
Albumin: 4.2 g/dL (ref 3.6–5.1)
Alkaline phosphatase (APISO): 58 U/L (ref 35–144)
BUN: 20 mg/dL (ref 7–25)
CO2: 31 mmol/L (ref 20–32)
Calcium: 9.4 mg/dL (ref 8.6–10.3)
Chloride: 101 mmol/L (ref 98–110)
Creat: 0.97 mg/dL (ref 0.70–1.22)
Globulin: 2.6 g/dL (calc) (ref 1.9–3.7)
Glucose, Bld: 127 mg/dL — ABNORMAL HIGH (ref 65–99)
Potassium: 4.2 mmol/L (ref 3.5–5.3)
Sodium: 139 mmol/L (ref 135–146)
Total Bilirubin: 0.6 mg/dL (ref 0.2–1.2)
Total Protein: 6.8 g/dL (ref 6.1–8.1)
eGFR: 77 mL/min/{1.73_m2} (ref >=60)

## 2022-11-21 LAB — CBC WITH DIFFERENTIAL/PLATELET
Absolute Monocytes: 216 cells/uL (ref 200–950)
Basophils Absolute: 21 cells/uL (ref 0–200)
Basophils Relative: 0.9 %
Eosinophils Absolute: 60 cells/uL (ref 15–500)
Eosinophils Relative: 2.6 %
HCT: 41.2 % (ref 38.5–50.0)
Hemoglobin: 14.1 g/dL (ref 13.2–17.1)
Lymphs Abs: 943 cells/uL (ref 850–3900)
MCH: 30.2 pg (ref 27.0–33.0)
MCHC: 34.2 g/dL (ref 32.0–36.0)
MCV: 88.2 fL (ref 80.0–100.0)
MPV: 9.2 fL (ref 7.5–12.5)
Monocytes Relative: 9.4 %
Neutro Abs: 1060 cells/uL — ABNORMAL LOW (ref 1500–7800)
Neutrophils Relative %: 46.1 %
Platelets: 224 10*3/uL (ref 140–400)
RBC: 4.67 10*6/uL (ref 4.20–5.80)
RDW: 12 % (ref 11.0–15.0)
Total Lymphocyte: 41 %
WBC: 2.3 10*3/uL — ABNORMAL LOW (ref 3.8–10.8)

## 2022-11-21 LAB — LIPID PANEL
Cholesterol: 180 mg/dL (ref <200)
HDL: 60 mg/dL (ref >=40)
LDL Cholesterol (Calc): 106 mg/dL (calc) — ABNORMAL HIGH
Non-HDL Cholesterol (Calc): 120 mg/dL (calc) (ref <130)
Total CHOL/HDL Ratio: 3 (calc) (ref <5.0)
Triglycerides: 47 mg/dL (ref <150)

## 2022-11-21 LAB — B12 AND FOLATE PANEL
Folate: >24 ng/mL
Vitamin B-12: 806 pg/mL (ref 200–1100)

## 2022-11-21 LAB — MICROALBUMIN / CREATININE URINE RATIO
Creatinine, Urine: 84 mg/dL (ref 20–320)
Microalb Creat Ratio: 7 mcg/mg creat (ref <30)
Microalb, Ur: 0.6 mg/dL

## 2022-11-21 LAB — VITAMIN D 25 HYDROXY (VIT D DEFICIENCY, FRACTURES): Vit D, 25-Hydroxy: 25 ng/mL — ABNORMAL LOW (ref 30–100)

## 2022-11-24 ENCOUNTER — Other Ambulatory Visit: Payer: Self-pay | Admitting: Family Medicine

## 2022-12-25 ENCOUNTER — Encounter: Payer: Self-pay | Admitting: Podiatry

## 2022-12-28 ENCOUNTER — Ambulatory Visit: Payer: Medicare Other | Admitting: Podiatry

## 2022-12-28 DIAGNOSIS — Z961 Presence of intraocular lens: Secondary | ICD-10-CM | POA: Diagnosis not present

## 2022-12-28 DIAGNOSIS — H401133 Primary open-angle glaucoma, bilateral, severe stage: Secondary | ICD-10-CM | POA: Diagnosis not present

## 2023-01-08 ENCOUNTER — Ambulatory Visit (INDEPENDENT_AMBULATORY_CARE_PROVIDER_SITE_OTHER): Payer: Medicare Other | Admitting: Podiatry

## 2023-01-08 DIAGNOSIS — M79676 Pain in unspecified toe(s): Secondary | ICD-10-CM

## 2023-01-08 DIAGNOSIS — E119 Type 2 diabetes mellitus without complications: Secondary | ICD-10-CM

## 2023-01-08 DIAGNOSIS — B351 Tinea unguium: Secondary | ICD-10-CM | POA: Diagnosis not present

## 2023-01-08 NOTE — Progress Notes (Signed)
This patient returns to my office for at risk foot care.  This patient requires this care by a professional since this patient will be at risk due to having type 2 diabetes.   This patient is unable to cut nails himself since the patient cannot reach his nails.These nails are painful walking and wearing shoes.  This patient presents for at risk foot care today.  Patient is also have severe nighttime itching.  General Appearance  Alert, conversant and in no acute stress.  Vascular  Dorsalis pedis and posterior tibial  pulses are weakly  palpable  bilaterally.  Capillary return is within normal limits  bilaterally. Cold feet.  Bilaterally. Absent digital hair.  Neurologic  Senn-Weinstein monofilament wire test within normal limits  bilaterally. Muscle power within normal limits bilaterally.  Nails Thick disfigured discolored nails with subungual debris  from hallux to fifth toes bilaterally. No evidence of bacterial infection or drainage bilaterally.  Orthopedic  No limitations of motion  feet .  No crepitus or effusions noted.  No bony pathology or digital deformities noted.  HAV  B/L.  Bony prominence fifth metabase.    Skin  normotropic skin with no porokeratosis noted bilaterally.  No signs of infections or ulcers noted.   Peeling and itching feet  B/L.  Onychomycosis  Pain in right toes  Pain in left toes  Dermatitis  Consent was obtained for treatment procedures.   Mechanical debridement of nails 1-5  bilaterally performed with a nail nipper.  Filed with dremel without incident.  Prescribe lotrisone.    Return office visit   4  months                   Told patient to return for periodic foot care and evaluation due to potential at risk complications.    Boneta Lucks D.P.M.

## 2023-02-15 ENCOUNTER — Ambulatory Visit: Payer: Self-pay

## 2023-02-15 NOTE — Telephone Encounter (Signed)
Summary: pulled muscle in right arm?   Pt states that he think he pulled a muscle in his right arm and would like advise. Please call pt back.      Called pt - left message on machine to return call.

## 2023-02-15 NOTE — Telephone Encounter (Signed)
Summary: pulled muscle in right arm?   Pt states that he think he pulled a muscle in his right arm and would like advise. Please call pt back.      Called pt - left message on machine to return call. 

## 2023-02-15 NOTE — Telephone Encounter (Signed)
Summary: pulled muscle in right arm?   Pt states that he think he pulled a muscle in his right arm and would like advise. Please call pt back.      Called pt - left message on machine to return call.  Will forward to clinic for follow up.

## 2023-02-15 NOTE — Telephone Encounter (Signed)
Spoke with pt and he is currently using biofreeze and it seem to be working. He will call back if need.

## 2023-02-19 ENCOUNTER — Encounter: Payer: Self-pay | Admitting: Family Medicine

## 2023-02-19 ENCOUNTER — Ambulatory Visit (INDEPENDENT_AMBULATORY_CARE_PROVIDER_SITE_OTHER): Payer: Medicare Other | Admitting: Family Medicine

## 2023-02-19 VITALS — BP 122/68 | HR 65 | Resp 16 | Ht 68.0 in | Wt 124.0 lb

## 2023-02-19 DIAGNOSIS — M25511 Pain in right shoulder: Secondary | ICD-10-CM | POA: Diagnosis not present

## 2023-02-19 MED ORDER — CELECOXIB 100 MG PO CAPS
100.0000 mg | ORAL_CAPSULE | Freq: Two times a day (BID) | ORAL | 0 refills | Status: DC
Start: 2023-02-19 — End: 2023-08-10

## 2023-02-19 NOTE — Progress Notes (Signed)
Name: Austin Hunt   MRN: [id number]    DOB: 07/09/1941   Date:02/19/2023       Progress Note  Subjective  Chief Complaint  Shoulder Pain  HPI  Shoulder pain : he states that two weeks ago he used his left hand to break some branches from a tree and fell on right shoulder, pain was initially very high and sharp, he states now pain is worse at night, he wakes up and has to move his shoulder, no swelling or redness, he is able to move his shoulder, normal grip. He has been using biofreeze. 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 06/21/2020   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 06/21/2020   Cervical pain (neck) 10/13/2016   Other neutropenia 01/07/2016   Carotid artery narrowing 04/08/2015   Chronic constipation 04/08/2015   H/O inguinal hernia repair 04/08/2015   Hyperlipidemia associated with type 2 diabetes mellitus 04/08/2015   LBP (low back pain) 04/08/2015   MI (mitral incompetence) 04/08/2015   TI (tricuspid incompetence) 04/08/2015   Unilateral recurrent femoral hernia without obstruction or gangrene 05/23/2014   Right inguinal hernia 02/06/2014   Cataract 01/16/2014   Glaucoma 01/16/2014   Reflux esophagitis 06/21/2009   Atherosclerosis of native coronary artery of native heart without angina pectoris 04/03/2009   Benign enlargement of prostate 02/11/2009   Decreased libido 01/12/2008    Past Surgical History:  Procedure Laterality Date   APPENDECTOMY  1954   BLADDER SURGERY  Oct 2015   CARDIAC CATHETERIZATION  2002   1 stent   COLONOSCOPY     EYE SURGERY  2011, 2013   cataracts   EYE SURGERY Right May 2016   glaucoma    HERNIA REPAIR Left 1967   HERNIA REPAIR Right 05-10-14   Right femoral hernia repair Dr Lemar Livings with PerFix plug, no onlay mesh.Marland Kitchen    PHOTOCOAGULATION WITH LASER Right 12/30/2015   Procedure: PHOTOCOAGULATION WITH LASER;  Surgeon: Sherald Hess, MD;  Location: [facility];  Service: Ophthalmology;  Laterality: Right;  DIABETIC - oral meds IVA BLOCK   PROSTATE SURGERY  Oct 2015    Family History  Problem Relation Age of Onset   Diabetes Sister    Diabetes Brother    Diabetes Sister    Prostate cancer Neg Hx    Bladder Cancer Neg Hx     Social History   Tobacco Use   Smoking status: Never   Smokeless tobacco: Never   Tobacco comments:    smoking cessation materials not required  Substance Use Topics   Alcohol use: No     Current Outpatient Medications:    Accu-Chek Softclix Lancets lancets, TEST 4 TIMES DAILY, Disp: 100 each, Rfl: 5   aspirin 81 MG tablet, Take 81 mg by mouth daily. , Disp: , Rfl:    Blood Glucose Monitoring Suppl (ACCU-CHEK GUIDE ME) w/Device KIT, , Disp: , Rfl:    brimonidine-timolol (COMBIGAN) 0.2-0.5 % ophthalmic solution, Place 1 drop into both eyes every 12 (twelve) hours., Disp: , Rfl:    Cholecalciferol (VITAMIN D) 50 MCG (2000 UT) CAPS, Take 1 capsule (2,000 Units total) by mouth daily., Disp: 30 capsule, Rfl:  0   clotrimazole-betamethasone (LOTRISONE) cream, Apply 1 application. topically at bedtime., Disp: 30 g, Rfl: 0   Cyanocobalamin (B-12) 1000 MCG SUBL, Place 1 tablet under the tongue daily., Disp: 30 tablet, Rfl: 0   docusate sodium (COLACE) 100 MG capsule, Take 100 mg by mouth daily., Disp: , Rfl:    dorzolamide (TRUSOPT) 2 % ophthalmic solution, 1 drop 2 (two) times daily. , Disp: , Rfl:    ezetimibe (ZETIA) 10 MG tablet, Take 1 tablet (10 mg total) by mouth daily., Disp: 90 tablet, Rfl: 1   Garlic 10 MG CAPS, Take 15 mg by mouth daily. , Disp: , Rfl:    glucose blood (ACCU-CHEK GUIDE) test strip, USE AS DIRECTED. 4 TIMES  DAILY, Disp: 100 each, Rfl: 10   icosapent Ethyl (VASCEPA) 1 g capsule, TAKE 2 CAPSULES BY MOUTH TWICE DAILY AS DIRECTED, Disp: 120 capsule, Rfl: 5   ketoconazole (NIZORAL) 2 % cream, SMARTSIG:1 Application Topical 1 to 2 Times Daily, Disp: , Rfl:    latanoprost (XALATAN) 0.005 % ophthalmic solution, Place 1 drop into both eyes at bedtime. , Disp: , Rfl:    Magnesium 100 MG TABS, Take by mouth. , Disp: , Rfl:    metFORMIN (GLUCOPHAGE-XR) 750 MG 24 hr tablet, Take 1 tablet (750 mg total) by mouth daily with breakfast., Disp: 90 tablet, Rfl: 1   Multiple Vitamin (MULTIVITAMIN) tablet, Take 1 tablet by mouth daily., Disp: , Rfl:    pioglitazone (ACTOS) 15 MG tablet, Take 1 tablet (15 mg total) by mouth daily., Disp: 90 tablet, Rfl: 1   polyethylene glycol powder (GLYCOLAX/MIRALAX) 17 GM/SCOOP powder, Take 17 g by mouth daily., Disp: 3350 g, Rfl: 1   sildenafil (REVATIO) 20 MG tablet, Take 1-5 tablets one hour prior to intercourse as needed., Disp: 30 tablet, Rfl: 6   triamcinolone (KENALOG) 0.025 % cream, Apply topically., Disp: , Rfl:    vitamin C (ASCORBIC ACID) 500 MG tablet, Take 500 mg by mouth 2 (two) times daily. Chewable, Disp: , Rfl:    Zinc Acetate 50 MG CAPS, Take 1 capsule by mouth daily. , Disp: , Rfl:   Allergies  Allergen Reactions   Colesevelam Other (See Comments)   Penicillins Itching and Other (See Comments)   Statins Other (See Comments)    myalgia   Welchol [Colesevelam Hcl]     I personally reviewed active problem list, medication list, allergies, family history, social history, health maintenance with the patient/caregiver today.   ROS  Ten systems reviewed and is negative except as mentioned in HPI   Objective  Vitals:   02/19/23 1313  BP: 122/68  Pulse: 65  Resp: 16  SpO2: 98%  Weight: 124 lb (56.2 kg)  Height:  (1.727 m)    Body mass index is 18.85 kg/m.  Physical Exam  Constitutional: Patient appears well-developed and thin  No distress.   HEENT: head atraumatic, normocephalic, pupils equal and reactive to light, neck supple Cardiovascular: Normal rate, regular rhythm and normal heart sounds.  No murmur heard. No BLE edema. Pulmonary/Chest: Effort normal and breath sounds normal. No respiratory distress. Abdominal: Soft.  There is no tenderness. Muscular skeletal : normal rom of both shoulder, no swelling, mild pain during palpation of left upper chest wall and shoulder  Psychiatric: Patient has a normal mood and affect. behavior is normal. Judgment and thought content normal.    PHQ2/9:    02/19/2023    1:12 PM 11/20/2022    9:52 AM 08/20/2022   10:11  AM 06/04/2022    8:31 AM 04/20/2022   11:31 AM  Depression screen PHQ 2/9  Decreased Interest 0 0 0 0 0  Down, Depressed, Hopeless 0 0 0 0 0  PHQ - 2 Score 0 0 0 0 0  Altered sleeping 0 0 1 0 0  Tired, decreased energy 0 0 0 0 0  Change in appetite 0 0 0 0 0  Feeling bad or failure about yourself  0 0 0 0 0  Trouble concentrating 0 0 0 0 0  Moving slowly or fidgety/restless 0 0 0 0 0  Suicidal thoughts 0 0 0 0 0  PHQ-9 Score 0 0 1 0 0    phq 9 is negative   Fall Risk:    02/19/2023    1:12 PM 11/20/2022    9:52 AM 08/20/2022   10:11 AM 06/04/2022    8:33 AM 04/20/2022   11:31 AM  Fall Risk   Falls in the past year? 0 0 0 0 0  Number falls in past yr: 0 0 0    Injury with Fall? 0 0 0  0  Risk for fall due to : No Fall Risks No Fall Risks No Fall Risks No Fall Risks No Fall Risks  Follow up Falls prevention discussed Falls prevention discussed Falls prevention discussed Education provided;Falls evaluation completed Falls prevention discussed      Functional Status Survey: Is the patient deaf or have difficulty hearing?: No Does the patient have difficulty seeing, even when wearing glasses/contacts?: No Does the patient have difficulty concentrating, remembering, or making decisions?: No Does the patient have difficulty walking or climbing stairs?: No Does the  patient have difficulty dressing or bathing?: No Does the patient have difficulty doing errands alone such as visiting a doctor's office or shopping?: No    Assessment & Plan  1. Acute pain of right shoulder  - celecoxib (CELEBREX) 100 MG capsule; Take 1 capsule (100 mg total) by mouth 2 (two) times daily.  Dispense: 30 capsule; Refill: 0 .   Take it for 3 to 5 days with food and if no improvement call back if resolves stop medication

## 2023-02-23 ENCOUNTER — Other Ambulatory Visit: Payer: Self-pay | Admitting: Family Medicine

## 2023-02-23 DIAGNOSIS — N521 Erectile dysfunction due to diseases classified elsewhere: Secondary | ICD-10-CM

## 2023-02-23 DIAGNOSIS — E1139 Type 2 diabetes mellitus with other diabetic ophthalmic complication: Secondary | ICD-10-CM

## 2023-02-23 DIAGNOSIS — E1142 Type 2 diabetes mellitus with diabetic polyneuropathy: Secondary | ICD-10-CM

## 2023-03-09 ENCOUNTER — Ambulatory Visit: Payer: Self-pay | Admitting: *Deleted

## 2023-03-09 NOTE — Telephone Encounter (Signed)
bitten by ant.   Pt states that bitten by a black ant on his right hand and he has a bump and it is itching. Pt is wanting to know if he need to come in to see his doctor. Please advise.      Chief Complaint: Insect bite Symptoms: Black ant bite yesterday, right hand. Small hard area size of pencil tip. Itchy. Frequency: Yesterday Pertinent Negatives: Patient denies redness, warmth, drainage, swelling Disposition: [] ED /[] Urgent Care (no appt availability in office) / [] Appointment(In office/virtual)/ []  [facility]/ [x] Home Care/ [] Refused Recommended Disposition /[] [facility]/ []  Follow-up with PCP Additional Notes: Pt states he is sure insect was a black ant. Home care advise provided, pt verbalizes understanding.  Reason for Disposition  Itchy insect bite  Answer Assessment - Initial Assessment Questions 1. TYPE of INSECT: "What type of insect was it?"      Black ant 2. ONSET: "When did you get bitten?"      Yesterday 3. LOCATION: "Where is the insect bite located?"      Right hand 4. REDNESS: "Is the area red or pink?" If Yes, ask: "What size is area of redness?" (inches or cm). "When did the redness start?"     No 5. PAIN: "Is there any pain?" If Yes, ask: "How bad is it?"  (Scale 1-10; or mild, moderate, severe)     No 6. ITCHING: "Does it itch?" If Yes, ask: "How bad is the itch?"    - MILD: doesn't interfere with normal activities   - MODERATE-SEVERE: interferes with work, school, sleep, or other activities      Moderate itching 7. SWELLING: "How big is the swelling?" (inches, cm, or compare to coins)    Hard bump tip of pencil 8. OTHER SYMPTOMS: "Do you have any other symptoms?"  (e.g., difficulty breathing, hives)     No  Protocols used: Insect Bite-A-AH

## 2023-03-23 NOTE — Progress Notes (Unsigned)
Name: Austin Hunt   MRN: [id number]    DOB: 08-27-1941   Date:03/24/2023       Progress Note  Subjective  Chief Complaint  Follow Up  HPI  DMII:he is currently taking Metformin 750 mg daily and  Actos,A1C is at goal at 7.1 %  , we will recheck levels again at the lab . He has associated neuropathy - described as feet pruritis but intermittent and does not want to take medication for that he also has dyslipidemia. He sees podiatrist for thick nails, he has PAD and also onychomycosis  He denies polyphagia, polydipsia or polyphagia.   Malnutrition:  He used to be over 150 lbs, gradually losing weight, pants falling down, temporal waisting, states appetite is good , he eats oatmeal for breakfast and glucerna in am's,  goes to the gym and usually skips lunch or has a small snack ( he states not hungry mid day), he has dinner at home that is usually a full course meal, he lost another 7 lbs since January.  He was seen by Dr. Cathie Hoops and had labs done for neutropenia, per her note advised to check CT if weight continued to drop , we ordered CT lung and Abdomen, but insurance denied coverage for CT lung  Other neutropenia  : currently under the care of Dr. Cathie Hoops and reassuring labs and was released from her care in April 2022 but we have been monitoring it yearly. Last level  was down to 2.3    BPH and prostate cancer: he is seeing Urologist and denies any obstructive symptoms, except for nocturia about 1-  2 times per night.  Incidental finding of prostate cancer during TURP back in 2015 , last PSA was done June 2021 and it was 2.12 , he is now on yearly follow up with  Urologist  and last PSA 1.04 February 2022, he is due for a visit, he states he already has appointment scheduled    Dyslipidemia: he is not on statin therapy, myalgia in the past,  currently on Zetia and Vascepa and last LDL was down from 120 to 106, we will continue current regiment    Chronic constipation: he is doing better since he started  taking miralax again , he takes it prn Unchanged    CAD  He had a stress test done by Dr. Juliann Pares for evaluation of chest pain back in 11/2018 and results were normal . Last visit was 09/2022   He states chest pain under control with medications, very seldom has chest pain , he had a stent placed 2004. Unchanged    May 9 th 2022  NORMAL LEFT VENTRICULAR SYSTOLIC FUNCTION  NORMAL RIGHT VENTRICULAR SYSTOLIC FUNCTION  MILD VALVULAR REGURGITATION (See above)  NO VALVULAR STENOSIS  ESTIMATED LVEF >55%      11/2018:  Normal myocardial perfusion scan no evidence of stress-induced  myocardial ischemia ejection fraction of 64% conclusion negative scan.   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/21/2020   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) 06/21/2020   Cervical pain (neck) 10/13/2016   Other neutropenia (HCC) 01/07/2016   Carotid artery narrowing  04/08/2015   Chronic constipation 04/08/2015   H/O inguinal hernia repair 04/08/2015   Hyperlipidemia associated with type 2 diabetes mellitus (HCC) 04/08/2015   LBP (low back pain) 04/08/2015   MI (mitral incompetence) 04/08/2015   TI (tricuspid incompetence) 04/08/2015   Unilateral recurrent femoral hernia without obstruction or gangrene 05/23/2014   Right inguinal hernia 02/06/2014   Cataract 01/16/2014   Glaucoma 01/16/2014   Reflux esophagitis 06/21/2009   Atherosclerosis of native coronary artery of native heart without angina pectoris 04/03/2009   Benign enlargement of prostate 02/11/2009   Decreased libido 01/12/2008    Past Surgical History:  Procedure Laterality  Date   APPENDECTOMY  1954   BLADDER SURGERY  Oct 2015   CARDIAC CATHETERIZATION  2002   1 stent   COLONOSCOPY     EYE SURGERY  2011, 2013   cataracts   EYE SURGERY Right May 2016   glaucoma   HERNIA REPAIR Left 1967   HERNIA REPAIR Right 05-10-14   Right femoral hernia repair Dr Lemar Livings with PerFix plug, no onlay mesh.Marland Kitchen    PHOTOCOAGULATION WITH LASER Right 12/30/2015   Procedure: PHOTOCOAGULATION WITH LASER;  Surgeon: Sherald Hess, MD;  Location: [facility];  Service: Ophthalmology;  Laterality: Right;  DIABETIC - oral meds IVA BLOCK   PROSTATE SURGERY  Oct 2015    Family History  Problem Relation Age of Onset   Diabetes Sister    Diabetes Brother    Diabetes Sister    Prostate cancer Neg Hx    Bladder Cancer Neg Hx     Social History   Tobacco Use   Smoking status: Never   Smokeless tobacco: Never   Tobacco comments:    smoking cessation materials not required  Substance Use Topics   Alcohol use: No     Current Outpatient Medications:    Accu-Chek Softclix Lancets lancets, TEST 4 TIMES DAILY, Disp: 100 each, Rfl: 5   aspirin 81 MG tablet, Take 81 mg by mouth daily. , Disp: , Rfl:    Blood Glucose Monitoring Suppl (ACCU-CHEK GUIDE ME) w/Device KIT, , Disp: , Rfl:    brimonidine-timolol (COMBIGAN) 0.2-0.5 % ophthalmic solution, Place 1 drop into both eyes every 12 (twelve) hours., Disp: , Rfl:    celecoxib (CELEBREX) 100 MG capsule, Take 1 capsule (100 mg total) by mouth 2 (two) times daily., Disp: 30 capsule, Rfl: 0   Cholecalciferol (VITAMIN D) 50 MCG (2000 UT) CAPS, Take 1 capsule (2,000 Units total) by mouth daily., Disp: 30 capsule, Rfl: 0   clotrimazole-betamethasone (LOTRISONE) cream, Apply 1 application. topically at bedtime., Disp: 30 g, Rfl: 0   Cyanocobalamin (B-12) 1000 MCG SUBL, Place 1 tablet under the tongue daily., Disp: 30 tablet, Rfl: 0   docusate sodium (COLACE) 100 MG capsule, Take 100 mg by mouth daily., Disp: , Rfl:     dorzolamide (TRUSOPT) 2 % ophthalmic solution, 1 drop 2 (two) times daily. , Disp: , Rfl:    ezetimibe (ZETIA) 10 MG tablet, Take 1 tablet (10 mg total) by mouth daily., Disp: 90 tablet, Rfl: 1   Garlic 10 MG CAPS, Take 15 mg by mouth daily. , Disp: , Rfl:    glucose blood (ACCU-CHEK GUIDE) test strip, USE 4 TIMES A DAY AS DIRECTED, Disp: 100 each, Rfl: 10   icosapent Ethyl (VASCEPA) 1 g capsule, TAKE 2 CAPSULES BY MOUTH TWICE DAILY AS DIRECTED, Disp: 120 capsule, Rfl: 5   ketoconazole (NIZORAL) 2 % cream, SMARTSIG:1 Application Topical 1 to 2  Times Daily, Disp: , Rfl:    latanoprost (XALATAN) 0.005 % ophthalmic solution, Place 1 drop into both eyes at bedtime. , Disp: , Rfl:    Magnesium 100 MG TABS, Take by mouth. , Disp: , Rfl:    metFORMIN (GLUCOPHAGE-XR) 750 MG 24 hr tablet, TAKE 1 TABLET BY MOUTH ONCE DAILY WITH BREAKFAST, Disp: 90 tablet, Rfl: 1   Multiple Vitamin (MULTIVITAMIN) tablet, Take 1 tablet by mouth daily., Disp: , Rfl:    pioglitazone (ACTOS) 15 MG tablet, Take 1 tablet (15 mg total) by mouth daily., Disp: 90 tablet, Rfl: 1   polyethylene glycol powder (GLYCOLAX/MIRALAX) 17 GM/SCOOP powder, Take 17 g by mouth daily., Disp: 3350 g, Rfl: 1   sildenafil (REVATIO) 20 MG tablet, Take 1-5 tablets one hour prior to intercourse as needed., Disp: 30 tablet, Rfl: 6   triamcinolone (KENALOG) 0.025 % cream, Apply topically., Disp: , Rfl:    vitamin C (ASCORBIC ACID) 500 MG tablet, Take 500 mg by mouth 2 (two) times daily. Chewable, Disp: , Rfl:    Zinc Acetate 50 MG CAPS, Take 1 capsule by mouth daily. , Disp: , Rfl:   Allergies  Allergen Reactions   Colesevelam Other (See Comments)   Penicillins Itching and Other (See Comments)   Statins Other (See Comments)    myalgia   Welchol [Colesevelam Hcl]     I personally reviewed active problem list, medication list, allergies, family history, social history, health maintenance with the patient/caregiver today.   ROS  Ten systems  reviewed and is negative except as mentioned in HPI   Objective   Vitals:   03/24/23 1028  BP: 116/70  Pulse: 62  Resp: 14  Temp: 97.7 F (36.5 C)  TempSrc: Oral  SpO2: 98%  Weight: 123 lb (55.8 kg)  Height: 5\' 7"  (1.702 m)    Body mass index is 19.26 kg/m.  Physical Exam  Constitutional: Patient appears well-developed and malnourished  No distress.  HEENT: head atraumatic, normocephalic, pupils equal and reactive to light, neck supple Cardiovascular: Normal rate, regular rhythm and normal heart sounds.  No murmur heard. No BLE edema. Pulmonary/Chest: Effort normal and breath sounds normal. No respiratory distress. Abdominal: Soft.  There is no tenderness. Psychiatric: Patient has a normal mood and affect. behavior is normal. Judgment and thought content normal.   PHQ2/9:    03/24/2023   10:15 AM 02/19/2023    1:12 PM 11/20/2022    9:52 AM 08/20/2022   10:11 AM 06/04/2022    8:31 AM  Depression screen PHQ 2/9  Decreased Interest 0 0 0 0 0  Down, Depressed, Hopeless 0 0 0 0 0  PHQ - 2 Score 0 0 0 0 0  Altered sleeping 0 0 0 1 0  Tired, decreased energy 0 0 0 0 0  Change in appetite 0 0 0 0 0  Feeling bad or failure about yourself  0 0 0 0 0  Trouble concentrating 0 0 0 0 0  Moving slowly or fidgety/restless 0 0 0 0 0  Suicidal thoughts 0 0 0 0 0  PHQ-9 Score 0 0 0 1 0    phq 9 is negative   Fall Risk:    03/24/2023   10:15 AM 02/19/2023    1:12 PM 11/20/2022    9:52 AM 08/20/2022   10:11 AM 06/04/2022    8:33 AM  Fall Risk   Falls in the past year? 1 0 0 0 0  Number falls in past yr: 0  0 0 0   Injury with Fall? 1 0 0 0   Risk for fall due to : History of fall(s) No Fall Risks No Fall Risks No Fall Risks No Fall Risks  Follow up Falls prevention discussed;Education provided;Falls evaluation completed Falls prevention discussed Falls prevention discussed Falls prevention discussed Education provided;Falls evaluation completed      Functional Status  Survey: Is the patient deaf or have difficulty hearing?: No Does the patient have difficulty seeing, even when wearing glasses/contacts?: No Does the patient have difficulty concentrating, remembering, or making decisions?: No Does the patient have difficulty walking or climbing stairs?: No Does the patient have difficulty dressing or bathing?: No Does the patient have difficulty doing errands alone such as visiting a doctor's office or shopping?: No    Assessment & Plan    1. Type 2 diabetes, controlled, with peripheral neuropathy (HCC)  - COMPLETE METABOLIC PANEL WITH GFR - Hemoglobin A1c  2. Other neutropenia (HCC)  - CT Abdomen Pelvis Wo Contrast; Future  3. Mild protein-calorie malnutrition (HCC)  Discussed importance of eating lunch   4. Hyperlipidemia associated with type 2 diabetes mellitus (HCC)  LDL needs to be below 70  5. B12 deficiency  Taking supplements   6. Vitamin D deficiency  Continue supplements   7. Abnormal weight loss  - CT Abdomen Pelvis Wo Contrast; Future   Reviewed notes from Dr. Cathie Hoops, she advised to check CT if continues to lose weight. CT chest not covered by insurance so we will do CT abdomen and pelvis

## 2023-03-24 ENCOUNTER — Encounter: Payer: Self-pay | Admitting: Family Medicine

## 2023-03-24 ENCOUNTER — Ambulatory Visit (INDEPENDENT_AMBULATORY_CARE_PROVIDER_SITE_OTHER): Payer: Medicare Other | Admitting: Family Medicine

## 2023-03-24 VITALS — BP 116/70 | HR 62 | Temp 97.7°F | Resp 14 | Ht 67.0 in | Wt 123.0 lb

## 2023-03-24 DIAGNOSIS — E785 Hyperlipidemia, unspecified: Secondary | ICD-10-CM | POA: Diagnosis not present

## 2023-03-24 DIAGNOSIS — E1169 Type 2 diabetes mellitus with other specified complication: Secondary | ICD-10-CM

## 2023-03-24 DIAGNOSIS — D708 Other neutropenia: Secondary | ICD-10-CM | POA: Diagnosis not present

## 2023-03-24 DIAGNOSIS — E538 Deficiency of other specified B group vitamins: Secondary | ICD-10-CM

## 2023-03-24 DIAGNOSIS — E1142 Type 2 diabetes mellitus with diabetic polyneuropathy: Secondary | ICD-10-CM

## 2023-03-24 DIAGNOSIS — Z7984 Long term (current) use of oral hypoglycemic drugs: Secondary | ICD-10-CM

## 2023-03-24 DIAGNOSIS — R634 Abnormal weight loss: Secondary | ICD-10-CM

## 2023-03-24 DIAGNOSIS — E441 Mild protein-calorie malnutrition: Secondary | ICD-10-CM | POA: Diagnosis not present

## 2023-03-24 DIAGNOSIS — E559 Vitamin D deficiency, unspecified: Secondary | ICD-10-CM | POA: Diagnosis not present

## 2023-03-25 ENCOUNTER — Telehealth: Payer: Self-pay | Admitting: Family Medicine

## 2023-03-25 LAB — HEMOGLOBIN A1C
Hgb A1c MFr Bld: 7.7 % of total Hgb — ABNORMAL HIGH (ref <5.7)
Mean Plasma Glucose: 174 mg/dL
eAG (mmol/L): 9.7 mmol/L

## 2023-03-25 LAB — COMPLETE METABOLIC PANEL WITH GFR
AG Ratio: 1.7 (calc) (ref 1.0–2.5)
ALT: 13 U/L (ref 9–46)
AST: 21 U/L (ref 10–35)
Albumin: 4.3 g/dL (ref 3.6–5.1)
Alkaline phosphatase (APISO): 64 U/L (ref 35–144)
BUN: 18 mg/dL (ref 7–25)
CO2: 29 mmol/L (ref 20–32)
Calcium: 9.7 mg/dL (ref 8.6–10.3)
Chloride: 100 mmol/L (ref 98–110)
Creat: 0.85 mg/dL (ref 0.70–1.22)
Globulin: 2.6 g/dL (calc) (ref 1.9–3.7)
Glucose, Bld: 132 mg/dL — ABNORMAL HIGH (ref 65–99)
Potassium: 4.2 mmol/L (ref 3.5–5.3)
Sodium: 138 mmol/L (ref 135–146)
Total Bilirubin: 0.5 mg/dL (ref 0.2–1.2)
Total Protein: 6.9 g/dL (ref 6.1–8.1)
eGFR: 86 mL/min/{1.73_m2} (ref >=60)

## 2023-03-25 NOTE — Telephone Encounter (Signed)
Copied from CRM [id number]. Topic: General - Inquiry >> Mar 25, 2023  4:21 PM Clide Dales wrote: Patient would like a copy of his lab results from 5/22 mailed to him.

## 2023-03-26 ENCOUNTER — Telehealth: Payer: Self-pay | Admitting: Family Medicine

## 2023-03-26 NOTE — Telephone Encounter (Signed)
Labs printed and mailed.

## 2023-03-26 NOTE — Telephone Encounter (Signed)
Melissa calling from [facility] is calling to ask if the Tax Id & NPI can be corrected to the pre authorization for CT Ab Pelvis for [facility]   Correct NPI [id number] Tax ID [id number]  CB- [phone]  x [phone]

## 2023-03-30 ENCOUNTER — Other Ambulatory Visit: Payer: Self-pay | Admitting: Family Medicine

## 2023-03-30 DIAGNOSIS — E1136 Type 2 diabetes mellitus with diabetic cataract: Secondary | ICD-10-CM

## 2023-03-30 DIAGNOSIS — E1142 Type 2 diabetes mellitus with diabetic polyneuropathy: Secondary | ICD-10-CM

## 2023-03-31 NOTE — Telephone Encounter (Signed)
Pt stated he received a letter from [facility] that CT Abdomen Pelvis Wo Contrast needs to be extended and that insurance may not pay for it. He stated he has BCBS from [facility] and has [employer] stated he has his appointment tomorrow morning and needs assistance.  Pt is concerned that insurance may not be covering CT.  Please advise.

## 2023-04-01 ENCOUNTER — Ambulatory Visit
Admission: RE | Admit: 2023-04-01 | Discharge: 2023-04-01 | Disposition: A | Discharge status: Home / Self Care | Payer: Medicare Other | Source: Ambulatory Visit | Attending: Family Medicine | Admitting: Family Medicine

## 2023-04-01 DIAGNOSIS — D708 Other neutropenia: Secondary | ICD-10-CM | POA: Diagnosis not present

## 2023-04-01 DIAGNOSIS — N200 Calculus of kidney: Secondary | ICD-10-CM | POA: Diagnosis not present

## 2023-04-01 DIAGNOSIS — R634 Abnormal weight loss: Secondary | ICD-10-CM | POA: Diagnosis not present

## 2023-04-09 ENCOUNTER — Ambulatory Visit: Payer: Medicare Other | Admitting: Podiatry

## 2023-04-15 ENCOUNTER — Telehealth: Payer: Self-pay

## 2023-04-15 NOTE — Telephone Encounter (Signed)
Pt called for CT results. Shared provider's note. No questions at this time.  Alba Cory, MD 04/04/2023  6:56 PM EDT     Reassuring CT abdomen and pelvis Nothing that could cause his weight loss. I will review it with him in person

## 2023-04-22 ENCOUNTER — Ambulatory Visit (INDEPENDENT_AMBULATORY_CARE_PROVIDER_SITE_OTHER): Payer: Medicare Other | Admitting: Podiatry

## 2023-04-22 DIAGNOSIS — Z91199 Patient's noncompliance with other medical treatment and regimen due to unspecified reason: Secondary | ICD-10-CM

## 2023-04-22 NOTE — Progress Notes (Signed)
1. No-show for appointment     

## 2023-04-23 DIAGNOSIS — R42 Dizziness and giddiness: Secondary | ICD-10-CM | POA: Diagnosis not present

## 2023-04-23 DIAGNOSIS — E119 Type 2 diabetes mellitus without complications: Secondary | ICD-10-CM | POA: Diagnosis not present

## 2023-04-23 DIAGNOSIS — M353 Polymyalgia rheumatica: Secondary | ICD-10-CM | POA: Diagnosis not present

## 2023-04-23 DIAGNOSIS — Z955 Presence of coronary angioplasty implant and graft: Secondary | ICD-10-CM | POA: Diagnosis not present

## 2023-04-23 DIAGNOSIS — I10 Essential (primary) hypertension: Secondary | ICD-10-CM | POA: Diagnosis not present

## 2023-04-23 DIAGNOSIS — I2510 Atherosclerotic heart disease of native coronary artery without angina pectoris: Secondary | ICD-10-CM | POA: Diagnosis not present

## 2023-04-23 DIAGNOSIS — R001 Bradycardia, unspecified: Secondary | ICD-10-CM | POA: Diagnosis not present

## 2023-04-26 ENCOUNTER — Ambulatory Visit: Payer: Self-pay

## 2023-04-26 ENCOUNTER — Encounter: Payer: Self-pay | Admitting: Podiatry

## 2023-04-26 ENCOUNTER — Ambulatory Visit (INDEPENDENT_AMBULATORY_CARE_PROVIDER_SITE_OTHER): Payer: Medicare Other | Admitting: Podiatry

## 2023-04-26 VITALS — BP 129/58 | HR 58

## 2023-04-26 DIAGNOSIS — B351 Tinea unguium: Secondary | ICD-10-CM

## 2023-04-26 DIAGNOSIS — B353 Tinea pedis: Secondary | ICD-10-CM

## 2023-04-26 DIAGNOSIS — E119 Type 2 diabetes mellitus without complications: Secondary | ICD-10-CM | POA: Diagnosis not present

## 2023-04-26 DIAGNOSIS — M79676 Pain in unspecified toe(s): Secondary | ICD-10-CM

## 2023-04-26 MED ORDER — KETOCONAZOLE 2 % EX CREA
TOPICAL_CREAM | CUTANEOUS | 1 refills | Status: DC
Start: 2023-04-26 — End: 2023-05-28

## 2023-04-26 NOTE — Progress Notes (Unsigned)
  Subjective:  Patient ID: Austin Hunt, male    DOB: 12/03/1940,  MRN: [id number]  Austin Hunt presents to clinic today for: {jgcomplaint:23593}  Chief Complaint  Patient presents with   Nail Problem    "Cut my nails."  Dr. Alba Cory - saw 2 months ago, glucose - 149 mg/dl    PCP is Alba Cory, MD.  Allergies  Allergen Reactions   Colesevelam Other (See Comments)   Penicillins Itching and Other (See Comments)   Statins Other (See Comments)    myalgia   Welchol [Colesevelam Hcl]     Review of Systems: Negative except as noted in the HPI.  Objective: No changes noted in today's physical examination. Vitals:   04/26/23 0939  BP: (!) 129/58  Pulse: (!) 58   Austin Hunt is a pleasant 82 y.o. male in NAD. AAO x 3.  Vascular Examination: Capillary refill time <3 seconds b/l LE. Faintly palpable pedal pulses b/l LE. Digital hair present b/l. No pedal edema b/l. Skin temperature gradient WNL b/l. No varicosities b/l. {jgvascular:23595}.  Dermatological Examination: Pedal skin with normal turgor, texture and tone b/l. No open wounds. No interdigital macerations b/l. Toenails 1-5 b/l thickened, discolored, dystrophic with subungual debris. There is pain on palpation to dorsal aspect of nailplates. {jgderm:23598}.  Neurological Examination: Protective sensation intact with 10 gram monofilament b/l LE. Vibratory sensation intact b/l LE. {jgneuro:23601::"Protective sensation intact 5/5 intact bilaterally with 10g monofilament b/l.","Vibratory sensation intact b/l.","Proprioception intact bilaterally."}  Musculoskeletal Examination: {jgmsk:23600}     Latest Ref Rng & Units 03/24/2023   10:31 AM 11/20/2022    9:53 AM 08/20/2022   10:15 AM  Hemoglobin A1C  Hemoglobin-A1c <5.7 % of total Hgb 7.7  7.3  6.3     Assessment/Plan: 1. Tinea pedis of both feet     Meds ordered this encounter  Medications   ketoconazole (NIZORAL) 2 % cream    Sig: Apply to both feet  and between toes once daily for 6 weeks.    Dispense:  60 g    Refill:  1   None {Jgplan:23602::"-Patient/POA to call should there be question/concern in the interim."}   No follow-ups on file.  Freddie Breech, DPM

## 2023-04-26 NOTE — Telephone Encounter (Signed)
  Chief Complaint: Cough - clear mucous Symptoms: Cough - bringing up clear fluid Frequency: Weds last week Pertinent Negatives: Patient denies fever, SOB Disposition: [] ED /[] Urgent Care (no appt availability in office) / [x] Appointment(In office/virtual)/ []  [facility]/ [] Home Care/ [] Refused Recommended Disposition /[]  Mobile Bus/ []  Follow-up with PCP Additional Notes: Pt states that he has had this cough since weds of last week. Pt has taken tea with honey and garlic pills. Pt states sometimes he brings up clear fluid. Not cough all the time. Pt will monitor s/s and call back if SOB, fever or other troubling s/s start.    Summary: Possible cold symptoms   Pt has had cold symptoms since last Wednesday, says he has a cough, mucus in his throat.  Best contact: [phone]     Reason for Disposition  Cough  Answer Assessment - Initial Assessment Questions 1. ONSET: "When did the cough begin?"      Weds last week 2. SEVERITY: "How bad is the cough today?"      Mild moderate 3. SPUTUM: "Describe the color of your sputum" (none, dry cough; clear, white, yellow, green)     Clear 4. HEMOPTYSIS: "Are you coughing up any blood?" If so ask: "How much?" (flecks, streaks, tablespoons, etc.)     no 5. DIFFICULTY BREATHING: "Are you having difficulty breathing?" If Yes, ask: "How bad is it?" (e.g., mild, moderate, severe)    - MILD: No SOB at rest, mild SOB with walking, speaks normally in sentences, can lie down, no retractions, pulse < 100.    - MODERATE: SOB at rest, SOB with minimal exertion and prefers to sit, cannot lie down flat, speaks in phrases, mild retractions, audible wheezing, pulse 100-120.    - SEVERE: Very SOB at rest, speaks in single words, struggling to breathe, sitting hunched forward, retractions, pulse > 120      no 6. FEVER: "Do you have a fever?" If Yes, ask: "What is your temperature, how was it measured, and when did it start?"     no 7.  CARDIAC HISTORY: "Do you have any history of heart disease?" (e.g., heart attack, congestive heart failure)      no 8. LUNG HISTORY: "Do you have any history of lung disease?"  (e.g., pulmonary embolus, asthma, emphysema)     no 10. OTHER SYMPTOMS: "Do you have any other symptoms?" (e.g., runny nose, wheezing, chest pain)       Mucous  Protocols used: Cough - Acute Productive-A-AH

## 2023-04-26 NOTE — Progress Notes (Deleted)
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/2020   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) 06/21/2020   Cervical pain (neck) 10/13/2016   Other neutropenia (HCC) 01/07/2016   Carotid artery narrowing 04/08/2015   Chronic constipation 04/08/2015   H/O inguinal hernia repair 04/08/2015   Hyperlipidemia associated with type 2 diabetes mellitus (HCC) 04/08/2015   LBP (low back pain) 04/08/2015   MI (mitral incompetence) 04/08/2015   TI (tricuspid incompetence) 04/08/2015   Unilateral recurrent femoral hernia without obstruction or gangrene 05/23/2014   Right inguinal hernia 02/06/2014   Cataract 01/16/2014   Glaucoma 01/16/2014   Reflux esophagitis 06/21/2009   Atherosclerosis of native coronary artery of native heart without angina pectoris 04/03/2009   Benign enlargement of prostate 02/11/2009   Decreased libido 01/12/2008    Past Surgical History:  Procedure Laterality Date   APPENDECTOMY  1954   BLADDER SURGERY  Oct 2015   CARDIAC CATHETERIZATION  2002   1 stent   COLONOSCOPY     EYE SURGERY  2011, 2013   cataracts   EYE SURGERY Right May 2016   glaucoma   HERNIA REPAIR Left 1967   HERNIA REPAIR Right 05-10-14   Right femoral hernia repair Dr Lemar Livings with PerFix plug, no onlay mesh.Marland Kitchen    PHOTOCOAGULATION WITH LASER Right 12/30/2015   Procedure: PHOTOCOAGULATION WITH LASER;  Surgeon: Sherald Hess, MD;  Location: [facility];  Service: Ophthalmology;  Laterality:  Right;  DIABETIC - oral meds IVA BLOCK   PROSTATE SURGERY  Oct 2015    Family History  Problem Relation Age of Onset   Diabetes Sister    Diabetes Brother    Diabetes Sister    Prostate cancer Neg Hx    Bladder Cancer Neg Hx     Social History   Tobacco Use   Smoking status: Never   Smokeless tobacco: Never   Tobacco comments:    smoking cessation materials not required  Substance Use Topics   Alcohol use: No     Current Outpatient Medications:    Accu-Chek Softclix Lancets lancets, TEST 4 TIMES DAILY, Disp: 100 each, Rfl: 5   aspirin 81 MG tablet, Take 81 mg by mouth daily. , Disp: , Rfl:    Blood Glucose Monitoring Suppl (ACCU-CHEK GUIDE ME) w/Device KIT, , Disp: , Rfl:    brimonidine-timolol (COMBIGAN) 0.2-0.5 % ophthalmic solution, Place 1 drop into both eyes every 12 (twelve) hours., Disp: , Rfl:    celecoxib (CELEBREX) 100 MG capsule, Take 1 capsule (100 mg total) by mouth 2 (two) times daily., Disp: 30 capsule, Rfl: 0   Cholecalciferol (VITAMIN D) 50 MCG (2000 UT) CAPS, Take 1 capsule (2,000 Units total) by mouth daily., Disp: 30 capsule, Rfl: 0   clotrimazole-betamethasone (LOTRISONE) cream, Apply 1 application. topically at bedtime., Disp: 30 g, Rfl: 0   Cyanocobalamin (B-12) 1000 MCG SUBL, Place 1 tablet under the tongue daily., Disp: 30 tablet, Rfl: 0   docusate sodium (COLACE) 100  MG capsule, Take 100 mg by mouth daily., Disp: , Rfl:    dorzolamide (TRUSOPT) 2 % ophthalmic solution, 1 drop 2 (two) times daily. , Disp: , Rfl:    ezetimibe (ZETIA) 10 MG tablet, Take 1 tablet (10 mg total) by mouth daily., Disp: 90 tablet, Rfl: 1   Garlic 10 MG CAPS, Take 15 mg by mouth daily. , Disp: , Rfl:    glucose blood (ACCU-CHEK GUIDE) test strip, USE 4 TIMES A DAY AS DIRECTED, Disp: 100 each, Rfl: 10   icosapent Ethyl (VASCEPA) 1 g capsule, TAKE 2 CAPSULES BY MOUTH TWICE DAILY AS DIRECTED, Disp: 120 capsule, Rfl: 5   ketoconazole (NIZORAL) 2 % cream, Apply to both feet and  between toes once daily for 6 weeks., Disp: 60 g, Rfl: 1   latanoprost (XALATAN) 0.005 % ophthalmic solution, Place 1 drop into both eyes at bedtime. , Disp: , Rfl:    Magnesium 100 MG TABS, Take by mouth. , Disp: , Rfl:    metFORMIN (GLUCOPHAGE-XR) 750 MG 24 hr tablet, TAKE 1 TABLET BY MOUTH ONCE DAILY WITH BREAKFAST, Disp: 90 tablet, Rfl: 1   Multiple Vitamin (MULTIVITAMIN) tablet, Take 1 tablet by mouth daily., Disp: , Rfl:    pioglitazone (ACTOS) 15 MG tablet, TAKE 1 TABLET BY MOUTH ONCE DAILY, Disp: 90 tablet, Rfl: 0   polyethylene glycol powder (GLYCOLAX/MIRALAX) 17 GM/SCOOP powder, Take 17 g by mouth daily., Disp: 3350 g, Rfl: 1   sildenafil (REVATIO) 20 MG tablet, Take 1-5 tablets one hour prior to intercourse as needed., Disp: 30 tablet, Rfl: 6   triamcinolone (KENALOG) 0.025 % cream, Apply topically., Disp: , Rfl:    vitamin C (ASCORBIC ACID) 500 MG tablet, Take 500 mg by mouth 2 (two) times daily. Chewable, Disp: , Rfl:    Zinc Acetate 50 MG CAPS, Take 1 capsule by mouth daily. , Disp: , Rfl:   Allergies  Allergen Reactions   Colesevelam Other (See Comments)   Penicillins Itching and Other (See Comments)   Statins Other (See Comments)    myalgia   Welchol [Colesevelam Hcl]     I personally reviewed active problem list, medication list, allergies, family history, social history, health maintenance with the patient/caregiver today.   ROS  ***  Objective  There were no vitals filed for this visit.  There is no height or weight on file to calculate BMI.  Physical Exam ***   PHQ2/9:    03/24/2023   10:15 AM 02/19/2023    1:12 PM 11/20/2022    9:52 AM 08/20/2022   10:11 AM 06/04/2022    8:31 AM  Depression screen PHQ 2/9  Decreased Interest 0 0 0 0 0  Down, Depressed, Hopeless 0 0 0 0 0  PHQ - 2 Score 0 0 0 0 0  Altered sleeping 0 0 0 1 0  Tired, decreased energy 0 0 0 0 0  Change in appetite 0 0 0 0 0  Feeling bad or failure about yourself  0 0 0 0 0  Trouble  concentrating 0 0 0 0 0  Moving slowly or fidgety/restless 0 0 0 0 0  Suicidal thoughts 0 0 0 0 0  PHQ-9 Score 0 0 0 1 0    phq 9 is {gen pos [id number]}   Fall Risk:    03/24/2023   10:15 AM 02/19/2023    1:12 PM 11/20/2022    9:52 AM 08/20/2022   10:11 AM 06/04/2022    8:33 AM  Fall  Risk   Falls in the past year? 1 0 0 0 0  Number falls in past yr: 0 0 0 0   Injury with Fall? 1 0 0 0   Risk for fall due to : History of fall(s) No Fall Risks No Fall Risks No Fall Risks No Fall Risks  Follow up Falls prevention discussed;Education provided;Falls evaluation completed Falls prevention discussed Falls prevention discussed Falls prevention discussed Education provided;Falls evaluation completed      Functional Status Survey:      Assessment & Plan  *** There are no diagnoses linked to this encounter.

## 2023-04-26 NOTE — Patient Instructions (Signed)
 To prevent reinfection, spray shoes with lysol every evening.  Clean tub or shower with bleach based cleanser.  Athlete's Foot Athlete's foot (tinea pedis) is a fungal infection of the skin on your feet. It often occurs on the skin that is between or underneath the toes. It can also occur on the soles of your feet. The infection can spread from person to person (is contagious). It can also spread when a person's bare feet come in contact with the fungus on shower floors or on items such as shoes. What are the causes? This condition is caused by a fungus that grows in warm, moist places. You can get athlete's foot by sharing shoes, shower stalls, towels, and wet floors with someone who is infected. Not washing your feet or changing your socks often enough can also lead to athlete's foot. What increases the risk? This condition is more likely to develop in: Men. People who have a weak body defense system (immune system). People who have diabetes. People who use public showers, such as at a gym. People who wear heavy-duty shoes, such as industrial or military shoes. Seasons with warm, humid weather. What are the signs or symptoms? Symptoms of this condition include: Itchy areas between your toes or on the soles of your feet. White, flaky, or scaly areas between your toes or on the soles of your feet. Very itchy small blisters between your toes or on the soles of your feet. Small cuts in your skin. These cuts can become infected. Thick or discolored toenails. How is this diagnosed? This condition may be diagnosed with a physical exam and a review of your medical history. Your health care provider may also take a skin or toenail sample to examine under a microscope. How is this treated? This condition is treated with antifungal medicines. These may be applied as powders, ointments, or creams. In severe cases, an oral antifungal medicine may be given. Follow these instructions at  home: Medicines Apply or take over-the-counter and prescription medicines only as told by your health care provider. Apply your antifungal medicine as told by your health care provider. Do not stop using the antifungal even if your condition improves. Foot care Do not scratch your feet. Keep your feet dry: Wear cotton or wool socks. Change your socks every day or if they become wet. Wear shoes that allow air to flow, such as sandals or canvas tennis shoes. Wash and dry your feet, including the area between your toes. Also, wash and dry your feet: Every day or as told by your health care provider. After exercising. General instructions Do not let others use towels, shoes, nail clippers, or other personal items that touch your feet. Protect your feet by wearing sandals in wet areas, such as locker rooms and shared showers. Keep all follow-up visits. This is important. If you have diabetes, keep your blood sugar under control. Contact a health care provider if: You have a fever. You have swelling, soreness, warmth, or redness in your foot. Your feet are not getting better with treatment. Your symptoms get worse. You have new symptoms. You have severe pain. Summary Athlete's foot (tinea pedis) is a fungal infection of the skin on your feet. It often occurs on skin that is between or underneath the toes. This condition is caused by a fungus that grows in warm, moist places. Symptoms include white, flaky, or scaly areas between your toes or on the soles of your feet. This condition is treated with antifungal medicines.   Keep your feet clean. Always dry them thoroughly. This information is not intended to replace advice given to you by your health care provider. Make sure you discuss any questions you have with your health care provider. Document Revised: 02/09/2021 Document Reviewed: 02/09/2021 Elsevier Patient Education  2024 [employer]  

## 2023-04-27 ENCOUNTER — Ambulatory Visit: Payer: Medicare Other | Admitting: Family Medicine

## 2023-04-29 DIAGNOSIS — H401133 Primary open-angle glaucoma, bilateral, severe stage: Secondary | ICD-10-CM | POA: Diagnosis not present

## 2023-04-29 DIAGNOSIS — Z961 Presence of intraocular lens: Secondary | ICD-10-CM | POA: Diagnosis not present

## 2023-05-13 DIAGNOSIS — W57XXXA Bitten or stung by nonvenomous insect and other nonvenomous arthropods, initial encounter: Secondary | ICD-10-CM | POA: Diagnosis not present

## 2023-05-13 DIAGNOSIS — S80862A Insect bite (nonvenomous), left lower leg, initial encounter: Secondary | ICD-10-CM | POA: Diagnosis not present

## 2023-05-28 ENCOUNTER — Other Ambulatory Visit: Payer: Self-pay | Admitting: Podiatry

## 2023-05-28 ENCOUNTER — Ambulatory Visit: Payer: Medicare Other | Admitting: Urology

## 2023-05-28 DIAGNOSIS — B353 Tinea pedis: Secondary | ICD-10-CM

## 2023-05-31 ENCOUNTER — Other Ambulatory Visit: Payer: Self-pay

## 2023-05-31 DIAGNOSIS — C61 Malignant neoplasm of prostate: Secondary | ICD-10-CM

## 2023-06-01 ENCOUNTER — Other Ambulatory Visit
Admission: RE | Admit: 2023-06-01 | Discharge: 2023-06-01 | Disposition: A | Discharge status: Home / Self Care | Payer: Medicare Other | Attending: Urology | Admitting: Urology

## 2023-06-01 DIAGNOSIS — C61 Malignant neoplasm of prostate: Secondary | ICD-10-CM | POA: Insufficient documentation

## 2023-06-01 LAB — PSA: Prostatic Specific Antigen: 2.04 ng/mL (ref 0.00–4.00)

## 2023-06-04 ENCOUNTER — Ambulatory Visit: Payer: Medicare Other | Admitting: Urology

## 2023-06-04 VITALS — BP 164/61 | HR 59 | Ht 67.0 in | Wt 125.0 lb

## 2023-06-04 DIAGNOSIS — R3912 Poor urinary stream: Secondary | ICD-10-CM | POA: Diagnosis not present

## 2023-06-04 DIAGNOSIS — N529 Male erectile dysfunction, unspecified: Secondary | ICD-10-CM

## 2023-06-04 DIAGNOSIS — N401 Enlarged prostate with lower urinary tract symptoms: Secondary | ICD-10-CM

## 2023-06-04 DIAGNOSIS — C61 Malignant neoplasm of prostate: Secondary | ICD-10-CM | POA: Diagnosis not present

## 2023-06-04 LAB — BLADDER SCAN AMB NON-IMAGING: Scan Result: 0

## 2023-06-04 NOTE — Progress Notes (Signed)
Austin Hunt,acting as a scribe for Austin Hunt.,have documented all relevant documentation on the behalf of Austin Hunt,as directed by  Austin Hunt while in the presence of Austin Hunt.  06/04/2023 5:24 PM   Austin Hunt [birth year] [id number]  Referring provider: Alba Cory, Hunt [address] [place],  [place] [address]  Chief Complaint Patient presents with  weak urinary stream   HPI: 82 year-old male with a personal history of BPH and erectile dysfunction who presents today for routine annual follow up. He underwent a TURP procedure with some incidental Gleason 3+3.  His PSA is relatively stable. His most recent PSA on 06/01/2023 was 2.04. We have been checking this annually.  Today, he reports that his urinary symptoms are fairly well controlled. He is on sildenafil for erectile dysfunction. He reports that he has not been using the medication recently due to his wife Austin Hunt's illness and subsequent passing.   His wife was diagnosed with stage four pancreatic cancer and passed away earlier this year. He has been experiencing weight loss and decreased appetite, which he attributes to grief and stress. He states that he making an effort to gain weight and has recently introduced Glucerna to his diet.    Results for orders placed or performed in visit on 06/04/23 Bladder Scan (Post Void Residual) in office Result Value Ref Range Scan Result 0 ml  IPSS  Row Name 06/04/23 1500 International Prostate Symptom Score How often have you had the sensation of not emptying your bladder? Less than half the time How often have you had to urinate less than every two hours? About half the time How often have you found you stopped and started again several times when you urinated? Less than half the time How often have you found it difficult to postpone urination? Not at All How often have you had a weak urinary stream? Less than 1 in  5 times How often have you had to strain to start urination? Not at All How many times did you typically get up at night to urinate? 1 Time Total IPSS Score 9 Quality of Life due to urinary symptoms If you were to spend the rest of your life with your urinary condition just the way it is now how would you feel about that? 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  Diabetes mellitus without complication (HCC)  Glaucoma  Headache rare - 1x/mo  Nocturia  Prostate cancer [facility])   Surgical History: Past Surgical History: Procedure Laterality Date  APPENDECTOMY 1954  BLADDER SURGERY Oct 2015  CARDIAC CATHETERIZATION 2002 1 stent  COLONOSCOPY  EYE SURGERY 2011, 2013 cataracts  EYE SURGERY Right May 2016 glaucoma  HERNIA REPAIR Left 1967  HERNIA REPAIR Right 05-10-14 Right femoral hernia repair Dr Lemar Livings with PerFix plug, no onlay mesh.Marland Kitchen   PHOTOCOAGULATION WITH LASER Right 12/30/2015 Procedure: PHOTOCOAGULATION WITH LASER;  Surgeon: Sherald Hess, Hunt;  [facility];  Service: Ophthalmology;  Laterality: Right;  DIABETIC - oral meds IVA BLOCK  PROSTATE SURGERY Oct 2015   Home Medications:  Allergies as of 06/04/2023     Reactions Colesevelam Other (See Comments) Penicillins Itching, Other (See Comments) Statins Other (See Comments) myalgia Welchol [colesevelam Hcl]   Medication List  Accurate as of June 04, 2023  5:24 PM. If you have any questions, ask your nurse or doctor.   Accu-Chek Guide Me w/Device Kit Accu-Chek Guide test strip Generic drug: glucose blood USE 4 TIMES A DAY AS DIRECTED Accu-Chek Softclix Lancets lancets TEST 4 TIMES DAILY ascorbic acid 500 MG tablet Commonly known as:  VITAMIN C Take 500 mg by mouth 2 (two) times daily. Chewable aspirin 81 MG tablet Take 81 mg by mouth daily. B-12 1000 MCG Subl Place 1 tablet under the tongue daily. brimonidine-timolol 0.2-0.5 % ophthalmic solution Commonly known as: COMBIGAN Place 1 drop into both eyes every 12 (twelve) hours. celecoxib 100 MG capsule Commonly known as: CeleBREX Take 1 capsule (100 mg total) by mouth 2 (two) times daily. clotrimazole-betamethasone cream Commonly known as: Lotrisone Apply 1 application. topically at bedtime. docusate sodium 100 MG capsule Commonly known as: COLACE Take 100 mg by mouth daily. dorzolamide 2 % ophthalmic solution Commonly known as: TRUSOPT 1 drop 2 (two) times daily. ezetimibe 10 MG tablet Commonly known as: ZETIA Take 1 tablet (10 mg total) by mouth daily. Garlic 10 MG Caps Take 15 mg by mouth daily. icosapent Ethyl 1 g capsule Commonly known as: Vascepa TAKE 2 CAPSULES BY MOUTH TWICE DAILY AS DIRECTED ketoconazole 2 % cream Commonly known as: NIZORAL APPLY TO BOTH FEET AND BETWEEN TAKE ON EMPTY STOMACH. ONCE A DAY FOR 6 WEEKS latanoprost 0.005 % ophthalmic solution Commonly known as: XALATAN Place 1 drop into both eyes at bedtime. Magnesium 100 MG Tabs Take by mouth. metFORMIN 750 MG 24 hr tablet Commonly known as: GLUCOPHAGE-XR TAKE 1 TABLET BY MOUTH ONCE DAILY WITH BREAKFAST multivitamin tablet Take 1 tablet by mouth daily. pioglitazone 15 MG tablet Commonly known as: ACTOS TAKE 1 TABLET BY MOUTH ONCE DAILY polyethylene glycol powder 17 GM/SCOOP powder Commonly known as: GLYCOLAX/MIRALAX Take 17 g by mouth daily. sildenafil 20 MG tablet Commonly known as: REVATIO Take 1-5 tablets one hour prior to intercourse as needed. triamcinolone 0.025 % cream Commonly known as: KENALOG Apply topically. Vitamin D 50 MCG (2000 UT) Caps Take 1 capsule (2,000 Units total) by mouth daily. Zinc Acetate 50 MG Caps Take 1 capsule by mouth  daily.    Allergies:  Allergies Allergen Reactions  Colesevelam Other (See Comments)  Penicillins Itching and Other (See Comments)  Statins Other (See Comments) myalgia  Welchol [Colesevelam Hcl]   Family History: Family History Problem Relation Age of Onset  Diabetes Sister  Diabetes Brother  Diabetes Sister  Prostate cancer Neg Hx  Bladder Cancer Neg Hx   Social History:  reports that he has never smoked. He has never used smokeless tobacco. He reports that he does not drink alcohol and does not use drugs.   Physical Exam: BP (!) 164/61   Pulse (!) 59   Ht 5\' 7"  (1.702 m)   Wt 125 lb (56.7 kg)   BMI 19.58 kg/m   Constitutional:  Alert and oriented, No acute distress. HEENT: [place] AT, moist mucus membranes.  Trachea midline, no masses. Neurologic: Grossly intact, no focal deficits, moving all 4 extremities. Psychiatric: Normal mood and affect.   Assessment & Plan:    1. Prostate cancer - Stable, on active surveillance - Check annually to ensure that it is not markedly elevated   2. BPH - Symptoms are well controlled - Continue current management  3. Erectile dysfunction - He reports that he has not been using the medication recently due to his wife Austin Hunt's illness and subsequent  passing - He will inform us if he needs a refill  Return in about 1 year (around 06/03/2024) for repeat PSA.  I have reviewed the above documentation for accuracy and completeness, and I agree with the above.   Austin Hunt   [facility] [address], [address] [place], [place] [address] [phone]

## 2023-06-08 NOTE — Progress Notes (Deleted)
Patient: Austin Hunt, Male    DOB: [birth year], 82 y.o.   MRN: [id number]  Visit Date: 06/08/2023  Today's Provider: Ruel Favors, MD   Medicare Wellness   Subjective:   Austin Hunt is a 82 y.o. male who presents today for his Subsequent Annual Wellness Visit.  Patient/Caregiver input:    HPI  IPSS Questionnaire (AUA-7): Over the past month.   1)  How often have you had a sensation of not emptying your bladder completely after you finish urinating?  {Rating:19227}  2)  How often have you had to urinate again less than two hours after you finished urinating? {Rating:19227}  3)  How often have you found you stopped and started again several times when you urinated?  {Rating:19227}  4) How difficult have you found it to postpone urination?  {Rating:19227}  5) How often have you had a weak urinary stream?  {Rating:19227}  6) How often have you had to push or strain to begin urination?  {Rating:19227}  7) How many times did you most typically get up to urinate from the time you went to bed until the time you got up in the morning?  {Rating:19228}  Total score:  0-7 mildly symptomatic   8-19 moderately symptomatic   20-35 severely symptomatic     Review of Systems Constitutional: Negative for fever or weight change.  Respiratory: Negative for cough and shortness of breath.   Cardiovascular: Negative for chest pain or palpitations.  Gastrointestinal: Negative for abdominal pain, no bowel changes.  Musculoskeletal: Negative for gait problem or joint swelling.  Skin: Negative for rash.  Neurological: Negative for dizziness or headache.  No other specific complaints in a complete review of systems (except as listed in HPI above).  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    Diabetes mellitus without complication (HCC)    Glaucoma    Headache     rare - 1x/mo   Nocturia    Prostate cancer [facility])     Past Surgical History:  Procedure Laterality Date   APPENDECTOMY  1954   BLADDER SURGERY  Oct 2015   CARDIAC CATHETERIZATION  2002   1 stent   COLONOSCOPY     EYE SURGERY  2011, 2013   cataracts   EYE SURGERY Right May 2016   glaucoma   HERNIA REPAIR Left 1967   HERNIA REPAIR Right 05-10-14   Right femoral hernia repair Dr Lemar Livings with PerFix plug, no onlay mesh.Marland Kitchen    PHOTOCOAGULATION WITH LASER Right 12/30/2015   Procedure: PHOTOCOAGULATION WITH LASER;  Surgeon: Sherald Hess, MD;  Location: [facility];  Service: Ophthalmology;  Laterality: Right;  DIABETIC - oral meds IVA BLOCK   PROSTATE SURGERY  Oct 2015    Family History  Problem Relation Age of Onset   Diabetes Sister    Diabetes Brother    Diabetes Sister    Prostate cancer Neg Hx    Bladder Cancer Neg Hx     Social History   Socioeconomic History   Marital status: Widowed    Spouse name: Jerrye Beavers   Number of children: 4   Years of education: some college   Highest education level: 12th grade  Occupational History    Employer: RETIRED  Tobacco Use   Smoking status: Never   Smokeless tobacco: Never  Tobacco comments:    smoking cessation materials not required  Vaping Use   Vaping status: Never Used  Substance and Sexual Activity   Alcohol use: No   Drug use: No   Sexual activity: Yes  Other Topics Concern   Not on file  Social History Narrative   Not on file   Social Determinants of Health   Financial Resource Strain: Low Risk  (07/08/2022)   Overall Financial Resource Strain (CARDIA)    Difficulty of Paying Living Expenses: Not hard at all  Food Insecurity: No Food Insecurity (06/04/2022)   Hunger Vital Sign    Worried About Running Out of Food in the Last Year: Never true    Ran Out of Food in the Last Year: Never true  Transportation Needs: No Transportation Needs (06/04/2022)   PRAPARE - Scientist, research (physical sciences) (Medical): No    Lack of Transportation (Non-Medical): No  Physical Activity: Sufficiently Active (06/04/2022)   Exercise Vital Sign    Days of Exercise per Week: 5 days    Minutes of Exercise per Session: 30 min  Stress: No Stress Concern Present (06/04/2022)   [employer] of Occupational Health - Occupational Stress Questionnaire    Feeling of Stress : Only a little  Social Connections: Moderately Integrated (06/04/2022)   Social Connection and Isolation Panel [NHANES]    Frequency of Communication with Friends and Family: More than three times a week    Frequency of Social Gatherings with Friends and Family: More than three times a week    Attends Religious Services: More than 4 times per year    Active Member of [employer] or Organizations: No    Attends Banker Meetings: Never    Marital Status: Married  Catering manager Violence: Not At Risk (06/04/2022)   Humiliation, Afraid, Rape, and Kick questionnaire    Fear of Current or Ex-Partner: No    Emotionally Abused: No    Physically Abused: No    Sexually Abused: No    Outpatient Encounter Medications as of 06/09/2023  Medication Sig   Accu-Chek Softclix Lancets lancets TEST 4 TIMES DAILY   aspirin 81 MG tablet Take 81 mg by mouth daily.    Blood Glucose Monitoring Suppl (ACCU-CHEK GUIDE ME) w/Device KIT    brimonidine-timolol (COMBIGAN) 0.2-0.5 % ophthalmic solution Place 1 drop into both eyes every 12 (twelve) hours.   celecoxib (CELEBREX) 100 MG capsule Take 1 capsule (100 mg total) by mouth 2 (two) times daily.   Cholecalciferol (VITAMIN D) 50 MCG (2000 UT) CAPS Take 1 capsule (2,000 Units total) by mouth daily.   clotrimazole-betamethasone (LOTRISONE) cream Apply 1 application. topically at bedtime.   Cyanocobalamin (B-12) 1000 MCG SUBL Place 1 tablet under the tongue daily.   docusate sodium (COLACE) 100 MG capsule Take 100 mg by mouth daily.   dorzolamide (TRUSOPT) 2 % ophthalmic solution 1 drop 2  (two) times daily.    ezetimibe (ZETIA) 10 MG tablet Take 1 tablet (10 mg total) by mouth daily.   Garlic 10 MG CAPS Take 15 mg by mouth daily.    glucose blood (ACCU-CHEK GUIDE) test strip USE 4 TIMES A DAY AS DIRECTED   icosapent Ethyl (VASCEPA) 1 g capsule TAKE 2 CAPSULES BY MOUTH TWICE DAILY AS DIRECTED   ketoconazole (NIZORAL) 2 % cream APPLY TO BOTH FEET AND BETWEEN TAKE ON EMPTY STOMACH. ONCE A DAY FOR 6 WEEKS   latanoprost (XALATAN) 0.005 % ophthalmic solution Place 1 drop into  both eyes at bedtime.    Magnesium 100 MG TABS Take by mouth.    metFORMIN (GLUCOPHAGE-XR) 750 MG 24 hr tablet TAKE 1 TABLET BY MOUTH ONCE DAILY WITH BREAKFAST   Multiple Vitamin (MULTIVITAMIN) tablet Take 1 tablet by mouth daily.   pioglitazone (ACTOS) 15 MG tablet TAKE 1 TABLET BY MOUTH ONCE DAILY   polyethylene glycol powder (GLYCOLAX/MIRALAX) 17 GM/SCOOP powder Take 17 g by mouth daily.   sildenafil (REVATIO) 20 MG tablet Take 1-5 tablets one hour prior to intercourse as needed.   triamcinolone (KENALOG) 0.025 % cream Apply topically.   vitamin C (ASCORBIC ACID) 500 MG tablet Take 500 mg by mouth 2 (two) times daily. Chewable   Zinc Acetate 50 MG CAPS Take 1 capsule by mouth daily.    No facility-administered encounter medications on file as of 06/09/2023.    Allergies  Allergen Reactions   Colesevelam Other (See Comments)   Penicillins Itching and Other (See Comments)   Statins Other (See Comments)    myalgia   Welchol [Colesevelam Hcl]     Care Team Updated in EHR: {Yes/No:30480221}  Last Vision Exam: *** Wears corrective lenses: {Yes/No:30480221} Last Dental Exam: *** Last Hearing Exam: *** Wears Hearing Aids: {Yes/No:30480221}  Functional Ability / Safety Screening 1.  Was the timed Get Up and Go test longer than 30 seconds?  {Blank single:19197::"yes","no"} 2.  Does the patient need help with the phone, transportation, shopping,      preparing meals, housework, laundry, medications, or  managing money?  {Blank single:19197::"yes","no"} 3.  Does the patient's home have:  loose throw rugs in the hallway?   {Blank single:19197::"yes","no"}      Grab bars in the bathroom? {Blank single:19197::"yes","no"}      Handrails on the stairs?   {Blank single:19197::"yes","no"}      Poor lighting?   {Blank single:19197::"yes","no"} 4.  Has the patient noticed any hearing difficulties?   {Blank single:19197::"yes","no"}  Diet Recall and Exercise Regimen: ***  Fall Risk: *** See screening under Objective Information  Depression Screen: *** See screening under Objective Information  Advanced Directives: A voluntary discussion about advance care planning including the explanation and discussion of advance directives was discussed with the patient. Explanation about the health care proxy and living will was reviewed.  During this discussion, the patient was able to identify a health care proxy as *** and plans/does not plan to fill out the paperwork required and will bring this to our office to keep on file. Does patient have a HCPOA?    {Blank single:19197::"yes","no"} If yes, name and contact information:  Does patient have a living will or MOST form?  {Blank single:19197::"yes","no"}  Cancer Screenings: Skin: *** Lung: Low Dose CT Chest recommended if Age 42-80 years, 30 pack-year currently smoking OR have quit w/in 15years. Patient {DOES NOT does:27190::"does not"} qualify.  Lifestyle risk factor issued reviewed: Diet, exercise, weight management, advised patient smoking is not healthy, nutrition/diet.   Prostate: *** Colon: ***  Additional Screenings: *** Hepatitis B/HIV/Syphillis: Hepatitis C Screening:  Intimate Partner Violence:  AAA Screen: Men age 38 to 75 years if ever smoked recommended to get a one time AAA ultrasound screening exam. Patient {DOES NOT does:27190::"does not"} qualify.  Objective:   Vitals: There were no vitals taken for this visit. There is no height or  weight on file to calculate BMI.  Lab Results  Component Value Date   CHOL 180 11/20/2022   CHOL 196 12/10/2021   CHOL 207 (H) 11/05/2020   Lab  Results  Component Value Date   HDL 60 11/20/2022   HDL 62 12/10/2021   HDL 60 11/05/2020   Lab Results  Component Value Date   LDLCALC 106 (H) 11/20/2022   LDLCALC 120 (H) 12/10/2021   LDLCALC 128 (H) 11/05/2020   Lab Results  Component Value Date   TRIG 47 11/20/2022   TRIG 58 12/10/2021   TRIG 88 11/05/2020   Lab Results  Component Value Date   CHOLHDL 3.0 11/20/2022   CHOLHDL 3.2 12/10/2021   CHOLHDL 3.5 11/05/2020   No results found for: "LDLDIRECT"  No results found.  Physical Exam Constitutional: Patient appears well-developed and well-nourished. Obese *** No distress.  HEENT: head atraumatic, normocephalic, pupils equal and reactive to light, ears ***, neck supple, throat within normal limits Cardiovascular: Normal rate, regular rhythm and normal heart sounds.  No murmur heard. No BLE edema. Pulmonary/Chest: Effort normal and breath sounds normal. No respiratory distress. Abdominal: Soft.  There is no tenderness. Psychiatric: Patient has a normal mood and affect. behavior is normal. Judgment and thought content normal.   Cognitive Testing - 6-CIT  Correct? Score   What year is it? {YES NO:22349} {Numbers; 0-4:31231} Yes = 0    No = 4  What month is it? {YES NO:22349} {Numbers; 0-4:31231} Yes = 0    No = 3  Remember:     Floyde Parkins, [address], [place]     What time is it? {YES NO:22349} {Numbers; 0-4:31231} Yes = 0    No = 3  Count backwards from 20 to 1 {YES NO:22349} {Numbers; 0-4:31231} Correct = 0    1 error = 2   More than 1 error = 4  Say the months of the year in reverse. {YES NO:22349} {Numbers; 0-4:31231} Correct = 0    1 error = 2   More than 1 error = 4  What address did I ask you to remember? {YES NO:22349} {NUMBERS; 0-10:5044} Correct = 0  1 error = 2    2 error = 4    3 error = 6    4 error = 8     All wrong = 10       TOTAL SCORE  {Numbers; 8-29:56213}/08   Interpretation:  {Desc; normal/abnormal:11317::"Normal"}  Normal (0-7) Abnormal (8-28)   Fall Risk:    03/24/2023   10:15 AM 02/19/2023    1:12 PM 11/20/2022    9:52 AM 08/20/2022   10:11 AM 06/04/2022    8:33 AM  Fall Risk   Falls in the past year? 1 0 0 0 0  Number falls in past yr: 0 0 0 0   Injury with Fall? 1 0 0 0   Risk for fall due to : History of fall(s) No Fall Risks No Fall Risks No Fall Risks No Fall Risks  Follow up Falls prevention discussed;Education provided;Falls evaluation completed Falls prevention discussed Falls prevention discussed Falls prevention discussed Education provided;Falls evaluation completed    Depression Screen    03/24/2023   10:15 AM 02/19/2023    1:12 PM 11/20/2022    9:52 AM 08/20/2022   10:11 AM 06/04/2022    8:31 AM  Depression screen PHQ 2/9  Decreased Interest 0 0 0 0 0  Down, Depressed, Hopeless 0 0 0 0 0  PHQ - 2 Score 0 0 0 0 0  Altered sleeping 0 0 0 1 0  Tired, decreased energy 0 0 0 0 0  Change in appetite 0  0 0 0 0  Feeling bad or failure about yourself  0 0 0 0 0  Trouble concentrating 0 0 0 0 0  Moving slowly or fidgety/restless 0 0 0 0 0  Suicidal thoughts 0 0 0 0 0  PHQ-9 Score 0 0 0 1 0    Recent Results (from the past 2160 hour(s))  COMPLETE METABOLIC PANEL WITH GFR     Status: Abnormal   Collection Time: 03/24/23 10:31 AM  Result Value Ref Range   Glucose, Bld 132 (H) 65 - 99 mg/dL    Comment: .            Fasting reference interval . For someone without known diabetes, a glucose value >125 mg/dL indicates that they may have diabetes and this should be confirmed with a follow-up test. .    BUN 18 7 - 25 mg/dL   Creat 4.09 8.11 - 9.14 mg/dL   eGFR 86 > OR = 60 NW/GNF/6.21H0   BUN/Creatinine Ratio SEE NOTE: 6 - 22 (calc)    Comment:    Not Reported: BUN and Creatinine are within    reference range. .    Sodium 138 135 - 146 mmol/L   Potassium  4.2 3.5 - 5.3 mmol/L   Chloride 100 98 - 110 mmol/L   CO2 29 20 - 32 mmol/L   Calcium 9.7 8.6 - 10.3 mg/dL   Total Protein 6.9 6.1 - 8.1 g/dL   Albumin 4.3 3.6 - 5.1 g/dL   Globulin 2.6 1.9 - 3.7 g/dL (calc)   AG Ratio 1.7 1.0 - 2.5 (calc)   Total Bilirubin 0.5 0.2 - 1.2 mg/dL   Alkaline phosphatase (APISO) 64 35 - 144 U/L   AST 21 10 - 35 U/L   ALT 13 9 - 46 U/L  Hemoglobin A1c     Status: Abnormal   Collection Time: 03/24/23 10:31 AM  Result Value Ref Range   Hgb A1c MFr Bld 7.7 (H) <5.7 % of total Hgb    Comment: For someone without known diabetes, a hemoglobin A1c value of 6.5% or greater indicates that they may have  diabetes and this should be confirmed with a follow-up  test. . For someone with known diabetes, a value <7% indicates  that their diabetes is well controlled and a value  greater than or equal to 7% indicates suboptimal  control. A1c targets should be individualized based on  duration of diabetes, age, comorbid conditions, and  other considerations. . Currently, no consensus exists regarding use of hemoglobin A1c for diagnosis of diabetes for children. .    Mean Plasma Glucose 174 mg/dL   eAG (mmol/L) 9.7 mmol/L    Comment: . This test was performed on the Roche cobas c503 platform. Effective 08/10/22, a change in test platforms from the Abbott Architect to the Roche cobas c503 may have shifted HbA1c results compared to historical results. Based on laboratory validation testing conducted at [facility], the Roche platform relative to the Abbott platform had an average increase in HbA1c value of < or = 0.3%. This difference is within accepted  variability established by the [facility]. Note that not all individuals will have had a shift in their results and direct comparisons between historical and current results for testing conducted on different platforms is not recommended.   PSA     Status: None   Collection  Time: 06/01/23  2:35 PM  Result Value Ref Range   Prostatic Specific Antigen 2.04 0.00 -  4.00 ng/mL    Comment: (NOTE) While PSA levels of <=4.00 ng/ml are reported as reference range, some men with levels below 4.00 ng/ml can have prostate cancer and many men with PSA above 4.00 ng/ml do not have prostate cancer.  Other tests such as free PSA, age specific reference ranges, PSA velocity and PSA doubling time may be helpful especially in men less than 66 years old. Performed at [facility], [address], [place], [place] [address]   Bladder Scan (Post Void Residual) in office     Status: None   Collection Time: 06/04/23  3:20 PM  Result Value Ref Range   Scan Result 0 ml      Assessment & Plan:     There are no diagnoses linked to this encounter. *** type dotphrase "dot"diagmed to refresh this list  Exercise Activities and Dietary recommendations  Goals       DIET - INCREASE WATER INTAKE      Recommend to drink at least 6-8 8oz glasses of water per day.       Exercise      Starting 08/31/16, I will continue exercising 4 times a week for 40 minutes.       I need a constipation medicine that doesn't use animal products (pt-stated)      Current Barriers:  Chronic constipation Long time vegetarian, avoids animal products such as gelatin used in many OTCs  Uses prune juice for constipation, but this gives him diarrhea  Pharmacist Clinical Goal(s):  Over the next 30 days, patient and clinical pharmacist will work together to find a suitable treatment for chronic constipation that meet's patients lifestyle.   Interventions: Identify alternative therapies for chronic constipation that are compatible with patient's vegetarian lifestyle and beliefs.   Patient Self Care Activities:  Calls pharmacy for medication refills Calls provider office for new concerns or questions  Initial goal documentation       Monitor and Manage My Blood Sugar-Diabetes Type 2       Timeframe:  Long-Range Goal Priority:  High Start Date:    01/02/21                         Expected End Date:  01/08/23                     Follow up within 90 days   -check blood sugar daily before breakfast - check blood sugar if I feel it is too high or too low - enter blood sugar readings and medication or insulin into daily log    Why is this important?   Checking your blood sugar at home helps to keep it from getting very high or very low.  Writing the results in a diary or log helps the doctor know how to care for you.  Your blood sugar log should have the time, date and the results.  Also, write down the amount of insulin or other medicine that you take.  Other information, like what you ate, exercise done and how you were feeling, will also be helpful.     Notes:       My eye drops are expensive (pt-stated)      Current Barriers:  financial  Pharmacist Clinical Goal(s): Over the next 30 days, Mr.. Tatge will provide the necessary supplementary documents (proof of out of pocket prescription expenditure, proof of household income) needed for medication assistance applications to [facility] pharmacist  pending available programs or foundations.   Interventions: [facility] pharmacist will research for medication assistance program for Combigan, Trusopt, and Xalatan (prescriber: [facility])  Patient Self Care Activities:  Gather necessary documents needed to apply for medication assistance  Initial goal documentation         Discussed health benefits of physical activity, and encouraged him to engage in regular exercise appropriate for his age and condition.   Immunization History  Administered Date(s) Administered   Fluad Quad(high Dose 65+) 08/01/2019, 08/19/2020, 07/22/2021, 08/20/2022   Influenza Split 07/02/2010   Influenza, High Dose Seasonal PF 08/14/2015, 07/30/2016, 08/19/2017, 08/09/2018   Influenza, Seasonal, Injecte, Preservative Fre 07/08/2011,  07/22/2012   Influenza,inj,Quad PF,6+ Mos 07/11/2013   PFIZER(Purple Top)SARS-COV-2 Vaccination 11/22/2019, 12/16/2019, 08/04/2020, 01/30/2021, 07/14/2021   PNEUMOCOCCAL CONJUGATE-20 04/20/2022   Pneumococcal Conjugate-13 01/22/2014   Pneumococcal-Unspecified 01/03/2008   Tdap 11/04/2010, 11/05/2020   Zoster Recombinant(Shingrix) 06/25/2020, 10/03/2020   Zoster, Live 06/14/2008    Health Maintenance  Topic Date Due   COVID-19 Vaccine (6 - 2023-24 season) 07/03/2022   Medicare Annual Wellness (AWV)  06/05/2023   INFLUENZA VACCINE  06/03/2023   OPHTHALMOLOGY EXAM  08/22/2023   FOOT EXAM  08/28/2023   HEMOGLOBIN A1C  09/24/2023   Diabetic kidney evaluation - Urine ACR  11/21/2023   Diabetic kidney evaluation - eGFR measurement  03/23/2024   DTaP/Tdap/Td (3 - Td or Tdap) 11/05/2030   Pneumonia Vaccine 28+ Years old  Completed   Zoster Vaccines- Shingrix  Completed   HPV VACCINES  Aged Out     No orders of the defined types were placed in this encounter.   Current Outpatient Medications:    Accu-Chek Softclix Lancets lancets, TEST 4 TIMES DAILY, Disp: 100 each, Rfl: 5   aspirin 81 MG tablet, Take 81 mg by mouth daily. , Disp: , Rfl:    Blood Glucose Monitoring Suppl (ACCU-CHEK GUIDE ME) w/Device KIT, , Disp: , Rfl:    brimonidine-timolol (COMBIGAN) 0.2-0.5 % ophthalmic solution, Place 1 drop into both eyes every 12 (twelve) hours., Disp: , Rfl:    celecoxib (CELEBREX) 100 MG capsule, Take 1 capsule (100 mg total) by mouth 2 (two) times daily., Disp: 30 capsule, Rfl: 0   Cholecalciferol (VITAMIN D) 50 MCG (2000 UT) CAPS, Take 1 capsule (2,000 Units total) by mouth daily., Disp: 30 capsule, Rfl: 0   clotrimazole-betamethasone (LOTRISONE) cream, Apply 1 application. topically at bedtime., Disp: 30 g, Rfl: 0   Cyanocobalamin (B-12) 1000 MCG SUBL, Place 1 tablet under the tongue daily., Disp: 30 tablet, Rfl: 0   docusate sodium (COLACE) 100 MG capsule, Take 100 mg by mouth daily., Disp:  , Rfl:    dorzolamide (TRUSOPT) 2 % ophthalmic solution, 1 drop 2 (two) times daily. , Disp: , Rfl:    ezetimibe (ZETIA) 10 MG tablet, Take 1 tablet (10 mg total) by mouth daily., Disp: 90 tablet, Rfl: 1   Garlic 10 MG CAPS, Take 15 mg by mouth daily. , Disp: , Rfl:    glucose blood (ACCU-CHEK GUIDE) test strip, USE 4 TIMES A DAY AS DIRECTED, Disp: 100 each, Rfl: 10   icosapent Ethyl (VASCEPA) 1 g capsule, TAKE 2 CAPSULES BY MOUTH TWICE DAILY AS DIRECTED, Disp: 120 capsule, Rfl: 5   ketoconazole (NIZORAL) 2 % cream, APPLY TO BOTH FEET AND BETWEEN TAKE ON EMPTY STOMACH. ONCE A DAY FOR 6 WEEKS, Disp: 60 g, Rfl: 1   latanoprost (XALATAN) 0.005 % ophthalmic solution, Place 1 drop into both  eyes at bedtime. , Disp: , Rfl:    Magnesium 100 MG TABS, Take by mouth. , Disp: , Rfl:    metFORMIN (GLUCOPHAGE-XR) 750 MG 24 hr tablet, TAKE 1 TABLET BY MOUTH ONCE DAILY WITH BREAKFAST, Disp: 90 tablet, Rfl: 1   Multiple Vitamin (MULTIVITAMIN) tablet, Take 1 tablet by mouth daily., Disp: , Rfl:    pioglitazone (ACTOS) 15 MG tablet, TAKE 1 TABLET BY MOUTH ONCE DAILY, Disp: 90 tablet, Rfl: 0   polyethylene glycol powder (GLYCOLAX/MIRALAX) 17 GM/SCOOP powder, Take 17 g by mouth daily., Disp: 3350 g, Rfl: 1   sildenafil (REVATIO) 20 MG tablet, Take 1-5 tablets one hour prior to intercourse as needed., Disp: 30 tablet, Rfl: 6   triamcinolone (KENALOG) 0.025 % cream, Apply topically., Disp: , Rfl:    vitamin C (ASCORBIC ACID) 500 MG tablet, Take 500 mg by mouth 2 (two) times daily. Chewable, Disp: , Rfl:    Zinc Acetate 50 MG CAPS, Take 1 capsule by mouth daily. , Disp: , Rfl:  There are no discontinued medications.  I have personally reviewed and addressed the Medicare Annual Wellness health risk assessment questionnaire and have noted the following in the patient's chart:  A.         Medical and social history & family history B.         Use of alcohol, tobacco or illicit drugs  C.         Current medications and  supplements D.         Functional and Cognitive ability and status E.         Nutritional status F.         Physical activity G.        Advance directives H.         List of other physicians I.          Hospitalizations, surgeries, and ER visits in previous 12 months J.         Vitals K.         Screenings such as hearing and vision if needed, cognitive and depression L.         Referrals and appointments - ***  In addition, I have reviewed and discussed with patient certain preventive protocols, quality metrics, and best practice recommendations. A written personalized care plan for preventive services as well as general preventive health recommendations were provided to patient.   See attached scanned questionnaire for additional information.   No follow-ups on file.  *** refresh to show

## 2023-06-09 ENCOUNTER — Encounter: Payer: Medicare Other | Admitting: Family Medicine

## 2023-06-14 ENCOUNTER — Ambulatory Visit: Payer: Self-pay

## 2023-06-14 NOTE — Telephone Encounter (Signed)
Chief Complaint: Weight loss  Symptoms: unintentional weight loss   Frequency: ongoing Pertinent Negatives: Patient denies skipping meals, try to loss weight Disposition: [] ED /[] Urgent Care (no appt availability in office) / [x] Appointment(In office/virtual)/ []  [facility]/ [] Home Care/ [] Refused Recommended Disposition /[] [place] [facility]/ []  Follow-up with PCP Additional Notes: Patient stated at his last OV the provider noticed that he was losing weight. Patient stated that he has continued to loss weight since then. Patient is unsure why he is losing so much weight. Patient reports going from 150lbs to 122lb over the pass 3 months or so. Care advice given and patient was advised that he will need to be evaluated. Patient was agreeable and has been scheduled for an appointment 06/16/23 with PCP.   Summary: Weight loss   The patient states he has been losing wait and wanted to speak with his provider about that. Please assist patient further as he believes it maybe related to him taking metFORMIN (GLUCOPHAGE-XR) 750 MG 24 hr tablet. The patient was around 150 lbs but now he is about 122 lbs. He lost his wife earlier this year as well.       Reason for Disposition  [1] Continued weight loss AND [2] after medical evaluation by doctor (or NP/PA)  Answer Assessment - Initial Assessment Questions 1. MAIN CONCERN: "What is your main concern today?"     I lost a lot of weight recently  2. WEIGHT LOSS: "How much weight have you lost?"  (e.g., lbs., kgs.)  "Over what period of time have you lost this weight?"  (e.g., number of days, weeks, months, years)     About 30lbs, over about 3 months  3. BASELINE WEIGHT: "What is your baseline or normal weight?" (e.g., "How much do you usually weigh?")     150lbs  4. CAUSE: "What do you think is causing the weight loss?" (e.g., depression, anxiety, medicine side effect, pain, trouble swallowing, substance or alcohol use problem, eating  disorder)     I'm not sure maybe the medication I am taking or something. 5. PRIOR EVALUATION: "Have you been evaluated by a doctor for your weight loss?" If Yes, ask "When was your last visit?" "What did your doctor (or NP/PA) tell you about the possible cause?"     Yes I saw Dr. Carlynn Purl a few months and she noticed he was losing weight. 6. HEART FAILURE TREATMENT: "Do you have heart failure?" If Yes, ask: "Have you taken new or extra water pills (diuretics) recently?" (e.g., furosemide; bumetanide). "What is your target weight?"     No 7. OTHER SYMPTOMS: "Do you have any other symptoms?" (e.g., anxiety or depression, blood in stool, breathing difficulty, diarrhea, fever, trouble swallowing)     No but some people said I maybe grieving the lost of my wife.  Protocols used: Weight Loss - Unintended-A-AH

## 2023-06-16 ENCOUNTER — Ambulatory Visit (INDEPENDENT_AMBULATORY_CARE_PROVIDER_SITE_OTHER): Payer: Medicare Other | Admitting: Family Medicine

## 2023-06-16 ENCOUNTER — Encounter: Payer: Self-pay | Admitting: Family Medicine

## 2023-06-16 VITALS — BP 120/68 | HR 63 | Resp 16 | Ht 67.0 in | Wt 126.0 lb

## 2023-06-16 DIAGNOSIS — E441 Mild protein-calorie malnutrition: Secondary | ICD-10-CM | POA: Diagnosis not present

## 2023-06-16 NOTE — Patient Instructions (Signed)
High-Protein and High-Calorie Diet Eating high-protein and high-calorie foods can help you to gain weight, heal after an injury, and recover after an illness or surgery. The specific amount of daily protein and calories you need depends on: Your body weight. The reason this diet is recommended for you. Generally, a high-protein, high-calorie diet involves: Eating 250-500 extra calories each day. Making sure that you get enough of your daily calories from protein. Ask your health care provider how many of your calories should come from protein. Talk with a health care provider or a dietitian about how much protein and how many calories you need each day. Follow the diet as directed by your health care provider. What are tips for following this plan?  Reading food labels Check the nutrition facts label for calories, grams of fat and protein. Items with more than 4 grams of protein are high-protein foods. Preparing meals Add whole milk, half-and-half, or heavy cream to cereal, pudding, soup, or hot cocoa. Add whole milk to instant breakfast drinks. Add peanut butter to oatmeal or smoothies. Add powdered milk to baked goods, smoothies, or milkshakes. Add powdered milk, cream, or butter to mashed potatoes. Add cheese to cooked vegetables. Make whole-milk yogurt parfaits. Top them with granola, fruit, or nuts. Add cottage cheese to fruit. Add avocado, cheese, or both to sandwiches or salads. Add avocado to smoothies. Add meat, poultry, or seafood to rice, pasta, casseroles, salads, and soups. Use mayonnaise when making egg salad, chicken salad, or tuna salad. Use peanut butter as a dip for fruits and vegetables or as a topping for pretzels, celery, or crackers. Add beans to casseroles, dips, and spreads. Add pureed beans to sauces and soups. Replace calorie-free drinks with calorie-containing drinks, such as milk and fruit juice. Replace water with milk or heavy cream when making foods such as  oatmeal, pudding, or cocoa. Add oil or butter to cooked vegetables and grains. Add cream cheese to sandwiches or as a topping on crackers and bread. Make cream-based pastas and soups. General information Ask your health care provider if you should take a nutritional supplement. Try to eat six small meals each day instead of three large meals. A general goal is to eat every 2 to 3 hours. Eat a balanced diet. In each meal, include one food that is high in protein and one food with fat in it. Keep nutritious snacks available, such as nuts, trail mixes, dried fruit, and yogurt. If you have kidney disease or diabetes, talk with your health care provider about how much protein is safe for you. Too much protein may put extra stress on your kidneys. Drink your calories. Choose high-calorie drinks and have them after your meals. Consider setting a timer to remind you to eat. You will want to eat even if you do not feel very hungry. What high-protein foods should I eat?  Vegetables Soybeans. Peas. Grains Quinoa. Bulgur wheat. Buckwheat. Meats and other proteins Beef, pork, and poultry. Fish and seafood. Eggs. Tofu. Textured vegetable protein (TVP). Peanut butter. Nuts and seeds. Dried beans. Protein powders. Hummus. Dairy Whole milk. Whole-milk yogurt. Powdered milk. Cheese. Cottage Cheese. Eggnog. Beverages High-protein supplement drinks. Soy milk. Other foods Protein bars. The items listed above may not be a complete list of foods and beverages you can eat and drink. Contact a dietitian for more information. What high-calorie foods should I eat? Fruits Dried fruit. Fruit leather. Canned fruit in syrup. Fruit juice. Avocado. Vegetables Vegetables cooked in oil or butter. Fried potatoes. Grains   Pasta. Quick breads. Muffins. Pancakes. Ready-to-eat cereal. Meats and other proteins Peanut butter. Nuts and seeds. Dairy Heavy cream. Whipped cream. Cream cheese. Sour cream. Ice cream. Custard.  Pudding. Whole milk dairy products. Beverages Meal-replacement beverages. Nutrition shakes. Fruit juice. Seasonings and condiments Salad dressing. Mayonnaise. Alfredo sauce. Fruit preserves or jelly. Honey. Syrup. Sweets and desserts Cake. Cookies. Pie. Pastries. Candy bars. Chocolate. Fats and oils Butter or margarine. Oil. Gravy. Other foods Meal-replacement bars. The items listed above may not be a complete list of foods and beverages you can eat and drink. Contact a dietitian for more information. Summary A high-protein, high-calorie diet can help you gain weight or heal faster after an injury, illness, or surgery. To increase your protein and calories, add ingredients such as whole milk, peanut butter, cheese, beans, meat, or seafood to meal items. To get enough extra calories each day, include high-calorie foods and beverages at each meal. Adding a high-calorie drink or shake can be an easy way to help you get enough calories each day. Talk with your healthcare provider or dietitian about the best options for you. This information is not intended to replace advice given to you by your health care provider. Make sure you discuss any questions you have with your health care provider. Document Revised: 09/20/2020 Document Reviewed: 09/22/2020 Elsevier Patient Education  2024 Elsevier Inc.  

## 2023-06-16 NOTE — Progress Notes (Signed)
Name: Austin Hunt   MRN: [id number]    DOB: 12-Nov-1940   Date:06/16/2023       Progress Note  Subjective  Chief Complaint  Unintentional Weight Loss  HPI  Malnutrition: going on for a long time, he has seen oncologist, he is trying to eat more often, he was worried today because on his scale his weight was 123 lbs and he thought it was lower than usual. During his last visit it was 122 lbs and today is 126 lbs  He exercises 4 days a week and does not want to stop doing that. 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) 06/21/2020   Cervical pain (neck) 10/13/2016   Other neutropenia (HCC) 01/07/2016   Carotid artery narrowing 04/08/2015   Chronic constipation 04/08/2015   H/O inguinal hernia repair 04/08/2015   Hyperlipidemia associated with type 2 diabetes mellitus (HCC) 04/08/2015   LBP (low back pain) 04/08/2015   MI (mitral incompetence) 04/08/2015   TI (tricuspid incompetence) 04/08/2015   Unilateral recurrent femoral hernia without obstruction or gangrene 05/23/2014   Right inguinal hernia 02/06/2014   Cataract 01/16/2014   Glaucoma 01/16/2014   Reflux esophagitis 06/21/2009   Atherosclerosis of native coronary artery of native heart without angina pectoris 04/03/2009   Benign enlargement of prostate 02/11/2009   Decreased libido 01/12/2008    Past Surgical History:  Procedure Laterality Date   APPENDECTOMY  1954   BLADDER SURGERY  Oct 2015   CARDIAC CATHETERIZATION  2002   1 stent   COLONOSCOPY     EYE SURGERY   2011, 2013   cataracts   EYE SURGERY Right May 2016   glaucoma   HERNIA REPAIR Left 1967   HERNIA REPAIR Right 05-10-14   Right femoral hernia repair Dr Lemar Livings with PerFix plug, no onlay mesh.Marland Kitchen    PHOTOCOAGULATION WITH LASER Right 12/30/2015   Procedure: PHOTOCOAGULATION WITH LASER;  Surgeon: Sherald Hess, MD;  Location: [facility];  Service: Ophthalmology;  Laterality: Right;  DIABETIC - oral meds IVA BLOCK   PROSTATE SURGERY  Oct 2015    Family History  Problem Relation Age of Onset   Diabetes Sister    Diabetes Brother    Diabetes Sister    Prostate cancer Neg Hx    Bladder Cancer Neg Hx     Social History   Tobacco Use   Smoking status: Never   Smokeless tobacco: Never   Tobacco comments:    smoking cessation materials not required  Substance Use Topics   Alcohol use: No     Current Outpatient Medications:    Accu-Chek Softclix Lancets lancets, TEST 4 TIMES DAILY, Disp: 100 each, Rfl: 5   aspirin 81 MG tablet, Take 81 mg by mouth daily. , Disp: , Rfl:    Blood Glucose Monitoring Suppl (ACCU-CHEK GUIDE ME) w/Device KIT, , Disp: , Rfl:    brimonidine-timolol (COMBIGAN) 0.2-0.5 % ophthalmic solution, Place 1 drop into both eyes every 12 (twelve) hours., Disp: , Rfl:    celecoxib (CELEBREX) 100 MG capsule, Take 1 capsule (100  mg total) by mouth 2 (two) times daily., Disp: 30 capsule, Rfl: 0   Cholecalciferol (VITAMIN D) 50 MCG (2000 UT) CAPS, Take 1 capsule (2,000 Units total) by mouth daily., Disp: 30 capsule, Rfl: 0   clotrimazole-betamethasone (LOTRISONE) cream, Apply 1 application. topically at bedtime., Disp: 30 g, Rfl: 0   Cyanocobalamin (B-12) 1000 MCG SUBL, Place 1 tablet under the tongue daily., Disp: 30 tablet, Rfl: 0   docusate sodium (COLACE) 100 MG capsule, Take 100 mg by mouth daily., Disp: , Rfl:    dorzolamide (TRUSOPT) 2 % ophthalmic solution, 1 drop 2 (two) times daily. , Disp: , Rfl:    ezetimibe (ZETIA) 10 MG tablet, Take 1 tablet  (10 mg total) by mouth daily., Disp: 90 tablet, Rfl: 1   Garlic 10 MG CAPS, Take 15 mg by mouth daily. , Disp: , Rfl:    glucose blood (ACCU-CHEK GUIDE) test strip, USE 4 TIMES A DAY AS DIRECTED, Disp: 100 each, Rfl: 10   icosapent Ethyl (VASCEPA) 1 g capsule, TAKE 2 CAPSULES BY MOUTH TWICE DAILY AS DIRECTED, Disp: 120 capsule, Rfl: 5   ketoconazole (NIZORAL) 2 % cream, APPLY TO BOTH FEET AND BETWEEN TAKE ON EMPTY STOMACH. ONCE A DAY FOR 6 WEEKS, Disp: 60 g, Rfl: 1   latanoprost (XALATAN) 0.005 % ophthalmic solution, Place 1 drop into both eyes at bedtime. , Disp: , Rfl:    Magnesium 100 MG TABS, Take by mouth. , Disp: , Rfl:    metFORMIN (GLUCOPHAGE-XR) 750 MG 24 hr tablet, TAKE 1 TABLET BY MOUTH ONCE DAILY WITH BREAKFAST, Disp: 90 tablet, Rfl: 1   Multiple Vitamin (MULTIVITAMIN) tablet, Take 1 tablet by mouth daily., Disp: , Rfl:    pioglitazone (ACTOS) 15 MG tablet, TAKE 1 TABLET BY MOUTH ONCE DAILY, Disp: 90 tablet, Rfl: 0   polyethylene glycol powder (GLYCOLAX/MIRALAX) 17 GM/SCOOP powder, Take 17 g by mouth daily., Disp: 3350 g, Rfl: 1   sildenafil (REVATIO) 20 MG tablet, Take 1-5 tablets one hour prior to intercourse as needed., Disp: 30 tablet, Rfl: 6   triamcinolone (KENALOG) 0.025 % cream, Apply topically., Disp: , Rfl:    vitamin C (ASCORBIC ACID) 500 MG tablet, Take 500 mg by mouth 2 (two) times daily. Chewable, Disp: , Rfl:    Zinc Acetate 50 MG CAPS, Take 1 capsule by mouth daily. , Disp: , Rfl:   Allergies  Allergen Reactions   Colesevelam Other (See Comments)   Penicillins Itching and Other (See Comments)   Statins Other (See Comments)    myalgia   Welchol [Colesevelam Hcl]     I personally reviewed active problem list, medication list, allergies, family history, social history, health maintenance with the patient/caregiver today.   ROS  Ten systems reviewed and is negative except as mentioned in HPI    Objective  Vitals:   06/16/23 0954  BP: 120/68  Pulse: 63   Resp: 16  SpO2: 99%  Weight: 126 lb (57.2 kg)  Height: 5\' 7"  (1.702 m)    Body mass index is 19.73 kg/m.  Physical Exam  Constitutional: Patient appears well-developed and malnourished  No distress.  HEENT: head atraumatic, normocephalic, pupils equal and reactive to light, neck supple Cardiovascular: Normal rate, regular rhythm and normal heart sounds.  No murmur heard. No BLE edema. Pulmonary/Chest: Effort normal and breath sounds normal. No respiratory distress. Abdominal: Soft.  There is no tenderness. Psychiatric: Patient has a normal mood and affect. behavior is normal. Judgment and thought content normal.  PHQ2/9:    06/16/2023    9:54 AM 03/24/2023   10:15 AM 02/19/2023    1:12 PM 11/20/2022    9:52 AM 08/20/2022   10:11 AM  Depression screen PHQ 2/9  Decreased Interest 0 0 0 0 0  Down, Depressed, Hopeless 0 0 0 0 0  PHQ - 2 Score 0 0 0 0 0  Altered sleeping 0 0 0 0 1  Tired, decreased energy 0 0 0 0 0  Change in appetite 0 0 0 0 0  Feeling bad or failure about yourself  0 0 0 0 0  Trouble concentrating 0 0 0 0 0  Moving slowly or fidgety/restless 0 0 0 0 0  Suicidal thoughts 0 0 0 0 0  PHQ-9 Score 0 0 0 0 1    phq 9 is negative   Fall Risk:    06/16/2023    9:54 AM 03/24/2023   10:15 AM 02/19/2023    1:12 PM 11/20/2022    9:52 AM 08/20/2022   10:11 AM  Fall Risk   Falls in the past year? 0 1 0 0 0  Number falls in past yr: 0 0 0 0 0  Injury with Fall? 0 1 0 0 0  Risk for fall due to : No Fall Risks History of fall(s) No Fall Risks No Fall Risks No Fall Risks  Follow up Falls prevention discussed Falls prevention discussed;Education provided;Falls evaluation completed Falls prevention discussed Falls prevention discussed Falls prevention discussed      Functional Status Survey: Is the patient deaf or have difficulty hearing?: No Does the patient have difficulty seeing, even when wearing glasses/contacts?: No Does the patient have difficulty  concentrating, remembering, or making decisions?: No Does the patient have difficulty walking or climbing stairs?: No Does the patient have difficulty dressing or bathing?: No Does the patient have difficulty doing errands alone such as visiting a doctor's office or shopping?: No    Assessment & Plan  1. Mild protein-calorie malnutrition (HCC)   Discussed high calorie diet but be mindful of plain sugar   Recently seen by urologist and PSA trending up but CT was fine

## 2023-06-30 ENCOUNTER — Other Ambulatory Visit: Payer: Self-pay | Admitting: Family Medicine

## 2023-06-30 ENCOUNTER — Other Ambulatory Visit: Payer: Self-pay | Admitting: Internal Medicine

## 2023-06-30 DIAGNOSIS — E1136 Type 2 diabetes mellitus with diabetic cataract: Secondary | ICD-10-CM

## 2023-06-30 DIAGNOSIS — E1142 Type 2 diabetes mellitus with diabetic polyneuropathy: Secondary | ICD-10-CM

## 2023-06-30 DIAGNOSIS — E785 Hyperlipidemia, unspecified: Secondary | ICD-10-CM

## 2023-07-01 NOTE — Telephone Encounter (Signed)
Requested by interface surescripts. Last doc. Refill 05/28/23 #90 . Requested too soon. Future visit in 1 month. Requested Prescriptions  Refused Prescriptions Disp Refills   pioglitazone (ACTOS) 15 MG tablet [Pharmacy Med Name: PIOGLITAZONE HCL 15 MG TAB] 90 tablet 0    Sig: TAKE 1 TABLET BY MOUTH ONCE DAILY     Endocrinology:  Diabetes - Glitazones - pioglitazone Passed - 06/30/2023 10:50 AM      Passed - HBA1C is between 0 and 7.9 and within 180 days    HbA1c, POC (prediabetic range)  Date Value Ref Range Status  10/19/2018 7.2 (A) 5.7 - 6.4 % Final   HbA1c, POC (controlled diabetic range)  Date Value Ref Range Status  10/19/2018 7.2 (A) 0.0 - 7.0 % Final   HbA1c POC (<> result, manual entry)  Date Value Ref Range Status  10/19/2018 7.2 4.0 - 5.6 % Final   Hgb A1c MFr Bld  Date Value Ref Range Status  03/24/2023 7.7 (H) <5.7 % of total Hgb Final    Comment:    For someone without known diabetes, a hemoglobin A1c value of 6.5% or greater indicates that they may have  diabetes and this should be confirmed with a follow-up  test. . For someone with known diabetes, a value <7% indicates  that their diabetes is well controlled and a value  greater than or equal to 7% indicates suboptimal  control. A1c targets should be individualized based on  duration of diabetes, age, comorbid conditions, and  other considerations. . Currently, no consensus exists regarding use of hemoglobin A1c for diagnosis of diabetes for children. Verna Czech - Valid encounter within last 6 months    Recent Outpatient Visits           2 weeks ago Mild protein-calorie malnutrition [facility])   [place] [facility] [place], Danna Hefty, MD   3 months ago Type 2 diabetes, controlled, with peripheral neuropathy [facility])   [place] [facility] Alba Cory, MD   4 months ago Acute pain of right shoulder   [facility] Alba Cory, MD   7  months ago Type 2 diabetes, controlled, with peripheral neuropathy [facility])   [place] [facility] Alba Cory, MD   10 months ago Type 2 diabetes, controlled, with peripheral neuropathy [facility])    [facility] [place] Alba Cory, MD       Future Appointments             In 1 month Carlynn Purl, Danna Hefty, MD [facility], [facility]   In 11 months Vanna Scotland, MD [facility] [place]

## 2023-07-02 NOTE — Telephone Encounter (Signed)
Pt said is out of the generic Actos 15 mg.  He said he has been out several days .  He takes it along with metformin but he still has the metformin  CB# [phone]

## 2023-07-06 ENCOUNTER — Other Ambulatory Visit: Payer: Self-pay | Admitting: Family Medicine

## 2023-07-06 DIAGNOSIS — E1139 Type 2 diabetes mellitus with other diabetic ophthalmic complication: Secondary | ICD-10-CM

## 2023-07-06 DIAGNOSIS — E1142 Type 2 diabetes mellitus with diabetic polyneuropathy: Secondary | ICD-10-CM

## 2023-07-06 NOTE — Telephone Encounter (Signed)
Medication Refill - Medication: pioglitazone (ACTOS) 15 MG tablet [Pharmacy Med Name: PIOGLITAZONE HCL 15 MG TAB  Has the patient contacted their pharmacy? Yes.      Preferred Pharmacy (with phone number or street name):  [name], [place] - [address]  [address] [place] [address]  Phone:  [phone]  Fax:  [phone]   Has the patient been seen for an appointment in the last year OR does the patient have an upcoming appointment? Yes.    Agent: Please be advised that RX refills may take up to 3 business days. We ask that you follow-up with your pharmacy.

## 2023-07-07 ENCOUNTER — Telehealth: Payer: Self-pay | Admitting: Family Medicine

## 2023-07-07 NOTE — Telephone Encounter (Signed)
Medication Refill - Medication: pioglitazone (ACTOS) 15 MG tablet   Has the patient contacted their pharmacy? Yes.    Preferred Pharmacy (with phone number or street name):  [name],  - [address] Phone: [phone]  Fax: [phone]     Has the patient been seen for an appointment in the last year OR does the patient have an upcoming appointment? Yes.    Agent: Please be advised that RX refills may take up to 3 business days. We ask that you follow-up with your pharmacy.

## 2023-07-08 MED ORDER — PIOGLITAZONE HCL 15 MG PO TABS: 15.0000 mg | ORAL_TABLET | Freq: Every day | ORAL | 0 refills | Status: DC

## 2023-07-08 NOTE — Telephone Encounter (Signed)
Requested Prescriptions  Pending Prescriptions Disp Refills   pioglitazone (ACTOS) 15 MG tablet 90 tablet 0    Sig: Take 1 tablet (15 mg total) by mouth daily.     Endocrinology:  Diabetes - Glitazones - pioglitazone Passed - 07/06/2023 11:47 AM      Passed - HBA1C is between 0 and 7.9 and within 180 days    HbA1c, POC (prediabetic range)  Date Value Ref Range Status  10/19/2018 7.2 (A) 5.7 - 6.4 % Final   HbA1c, POC (controlled diabetic range)  Date Value Ref Range Status  10/19/2018 7.2 (A) 0.0 - 7.0 % Final   HbA1c POC (<> result, manual entry)  Date Value Ref Range Status  10/19/2018 7.2 4.0 - 5.6 % Final   Hgb A1c MFr Bld  Date Value Ref Range Status  03/24/2023 7.7 (H) <5.7 % of total Hgb Final    Comment:    For someone without known diabetes, a hemoglobin A1c value of 6.5% or greater indicates that they may have  diabetes and this should be confirmed with a follow-up  test. . For someone with known diabetes, a value <7% indicates  that their diabetes is well controlled and a value  greater than or equal to 7% indicates suboptimal  control. A1c targets should be individualized based on  duration of diabetes, age, comorbid conditions, and  other considerations. . Currently, no consensus exists regarding use of hemoglobin A1c for diagnosis of diabetes for children. Verna Czech - Valid encounter within last 6 months    Recent Outpatient Visits           3 weeks ago Mild protein-calorie malnutrition [facility])   [place] [facility] [place], Danna Hefty, MD   3 months ago Type 2 diabetes, controlled, with peripheral neuropathy [facility])   [place] [facility] Alba Cory, MD   4 months ago Acute pain of right shoulder   [facility] Alba Cory, MD   7 months ago Type 2 diabetes, controlled, with peripheral neuropathy [facility])   [place] [facility] Alba Cory, MD   10 months ago  Type 2 diabetes, controlled, with peripheral neuropathy [facility])   [place] [facility] Alba Cory, MD       Future Appointments             In 1 month Carlynn Purl, Danna Hefty, MD [facility], [facility]   In 10 months Vanna Scotland, MD [facility] [place]

## 2023-07-08 NOTE — Telephone Encounter (Signed)
Refill sent to [name] #[id number]

## 2023-07-08 NOTE — Telephone Encounter (Unsigned)
Copied from CRM [id number]. Topic: General - Other >> Jul 07, 2023  5:17 PM Austin Hunt wrote: Reason for CRM: The patient has been directed to contact their PCP and request follow up on their previously submitted refill request of pioglitazone (ACTOS) 15 MG tablet [[id number]]  The patient has 0 tablets remaining   Please contact the patient further when possible

## 2023-07-16 DIAGNOSIS — Z23 Encounter for immunization: Secondary | ICD-10-CM | POA: Diagnosis not present

## 2023-07-22 ENCOUNTER — Other Ambulatory Visit: Payer: Self-pay | Admitting: Family Medicine

## 2023-07-22 DIAGNOSIS — E1169 Type 2 diabetes mellitus with other specified complication: Secondary | ICD-10-CM

## 2023-07-22 DIAGNOSIS — E1142 Type 2 diabetes mellitus with diabetic polyneuropathy: Secondary | ICD-10-CM

## 2023-07-22 DIAGNOSIS — E1136 Type 2 diabetes mellitus with diabetic cataract: Secondary | ICD-10-CM

## 2023-08-05 ENCOUNTER — Ambulatory Visit (INDEPENDENT_AMBULATORY_CARE_PROVIDER_SITE_OTHER): Payer: Medicare Other | Admitting: Podiatry

## 2023-08-05 ENCOUNTER — Encounter: Payer: Self-pay | Admitting: Podiatry

## 2023-08-05 DIAGNOSIS — E119 Type 2 diabetes mellitus without complications: Secondary | ICD-10-CM | POA: Diagnosis not present

## 2023-08-05 DIAGNOSIS — M79676 Pain in unspecified toe(s): Secondary | ICD-10-CM | POA: Diagnosis not present

## 2023-08-05 DIAGNOSIS — B353 Tinea pedis: Secondary | ICD-10-CM

## 2023-08-05 DIAGNOSIS — B351 Tinea unguium: Secondary | ICD-10-CM | POA: Diagnosis not present

## 2023-08-05 MED ORDER — KETOCONAZOLE 2 % EX CREA
TOPICAL_CREAM | CUTANEOUS | 1 refills | Status: AC
Start: 2023-08-05 — End: ?

## 2023-08-05 NOTE — Progress Notes (Signed)
Subjective:  Patient ID: Austin Hunt, male    DOB: 1940-12-26,  MRN: [id number]  82 y.o. male presents preventative diabetic foot care and painful thick toenails that are difficult to trim. Pain interferes with ambulation. Aggravating factors include wearing enclosed shoe gear. Pain is relieved with periodic professional debridement. Chief Complaint  Patient presents with   Diabetes    DFC BS -134 A1C- PCPV-05/2023     New problem(s): None   PCP is Alba Cory, MD.  Allergies  Allergen Reactions   Colesevelam Other (See Comments)   Penicillins Itching and Other (See Comments)   Statins Other (See Comments)    myalgia   Welchol [Colesevelam Hcl]     Review of Systems: Negative except as noted in the HPI.   Objective:  Austin Hunt is a pleasant 82 y.o. male thin build in NAD.Marland Kitchen AAO x 3.  Vascular Examination: Vascular status intact b/l with faintly palpable pedal pulses. CFT immediate b/l. Pedal hair absent. No edema. No pain with calf compression b/l. Skin temperature gradient warm to cool b/l. No varicosities noted. No cyanosis or clubbing noted.  Neurological Examination: Sensation grossly intact b/l with 10 gram monofilament. Vibratory sensation intact b/l.  Dermatological Examination: Pedal skin with normal turgor, texture and tone b/l. No open wounds nor interdigital macerations noted. Toenails 1-5 b/l thick, discolored, elongated with subungual debris and pain on dorsal palpation. No hyperkeratotic lesions noted b/l.   Diffuse scaling noted peripherally and plantarly left foot.  No interdigital macerations.  No blisters, no weeping. No signs of secondary bacterial infection noted.  Musculoskeletal Examination: Muscle strength 5/5 to b/l LE.  No pain, crepitus noted b/l. HAV with bunion deformity noted b/l LE.  Radiographs: None  Last A1c:      Latest Ref Rng & Units 03/24/2023   10:31 AM 11/20/2022    9:53 AM 08/20/2022   10:15 AM  Hemoglobin A1C   Hemoglobin-A1c <5.7 % of total Hgb 7.7  7.3  6.3    Assessment:   1. Pain due to onychomycosis of toenail   2. Tinea pedis of both feet   3. Diabetes mellitus without complication (HCC)    Plan:   Meds ordered this encounter  Medications   ketoconazole (NIZORAL) 2 % cream    Sig: Apply to both feet and between toes once daily for 6 weeks.    Dispense:  60 g    Refill:  1  -Consent given for treatment as described below: -Examined patient. -Continue foot and shoe inspections daily. Monitor blood glucose per PCP/Endocrinologist's recommendations. -Continue supportive shoe gear daily. -Mycotic toenails 1-5 bilaterally were debrided in length and girth with sterile nail nippers and dremel without incident. -For tinea pedis, Rx sent to pharmacy for Ketoconazole Cream 2% to be applied once daily for six weeks. -Patient/POA to call should there be question/concern in the interim.  Return in about 3 months (around 11/05/2023).  Freddie Breech, DPM

## 2023-08-05 NOTE — Patient Instructions (Signed)
 To prevent reinfection, spray shoes with lysol every evening.  Clean tub or shower with bleach based cleanser.  Athlete's Foot Athlete's foot (tinea pedis) is a fungal infection of the skin on your feet. It often occurs on the skin that is between or underneath the toes. It can also occur on the soles of your feet. The infection can spread from person to person (is contagious). It can also spread when a person's bare feet come in contact with the fungus on shower floors or on items such as shoes. What are the causes? This condition is caused by a fungus that grows in warm, moist places. You can get athlete's foot by sharing shoes, shower stalls, towels, and wet floors with someone who is infected. Not washing your feet or changing your socks often enough can also lead to athlete's foot. What increases the risk? This condition is more likely to develop in: Men. People who have a weak body defense system (immune system). People who have diabetes. People who use public showers, such as at a gym. People who wear heavy-duty shoes, such as industrial or military shoes. Seasons with warm, humid weather. What are the signs or symptoms? Symptoms of this condition include: Itchy areas between your toes or on the soles of your feet. White, flaky, or scaly areas between your toes or on the soles of your feet. Very itchy small blisters between your toes or on the soles of your feet. Small cuts in your skin. These cuts can become infected. Thick or discolored toenails. How is this diagnosed? This condition may be diagnosed with a physical exam and a review of your medical history. Your health care provider may also take a skin or toenail sample to examine under a microscope. How is this treated? This condition is treated with antifungal medicines. These may be applied as powders, ointments, or creams. In severe cases, an oral antifungal medicine may be given. Follow these instructions at  home: Medicines Apply or take over-the-counter and prescription medicines only as told by your health care provider. Apply your antifungal medicine as told by your health care provider. Do not stop using the antifungal even if your condition improves. Foot care Do not scratch your feet. Keep your feet dry: Wear cotton or wool socks. Change your socks every day or if they become wet. Wear shoes that allow air to flow, such as sandals or canvas tennis shoes. Wash and dry your feet, including the area between your toes. Also, wash and dry your feet: Every day or as told by your health care provider. After exercising. General instructions Do not let others use towels, shoes, nail clippers, or other personal items that touch your feet. Protect your feet by wearing sandals in wet areas, such as locker rooms and shared showers. Keep all follow-up visits. This is important. If you have diabetes, keep your blood sugar under control. Contact a health care provider if: You have a fever. You have swelling, soreness, warmth, or redness in your foot. Your feet are not getting better with treatment. Your symptoms get worse. You have new symptoms. You have severe pain. Summary Athlete's foot (tinea pedis) is a fungal infection of the skin on your feet. It often occurs on skin that is between or underneath the toes. This condition is caused by a fungus that grows in warm, moist places. Symptoms include white, flaky, or scaly areas between your toes or on the soles of your feet. This condition is treated with antifungal medicines.   Keep your feet clean. Always dry them thoroughly. This information is not intended to replace advice given to you by your health care provider. Make sure you discuss any questions you have with your health care provider. Document Revised: 02/09/2021 Document Reviewed: 02/09/2021 Elsevier Patient Education  2024 [employer]  

## 2023-08-09 NOTE — Progress Notes (Unsigned)
Name: Austin Hunt   MRN: [id number]    DOB: 1941-07-30   Date:08/10/2023       Progress Note  Subjective  Chief Complaint  Follow Up  HPI  DMII:he is currently taking Metformin 750 mg daily and  Actos - today A1C is down to 6.7 %  . He has associated neuropathy - described as feet pruritis but intermittent and does not want to take medication for that he also has dyslipidemia. He sees podiatrist for thick nails, he has PAD and also onychomycosis  He denies polyphagia, polydipsia or polyphagia. He also has glaucoma and cataract  and follows up with ophthalmologist   Malnutrition:  He used to be over 150 lbs, gradually losing weight, pants falling down, temporal waisting. He is  no longer skipping meals, also taking Glucerna protein shakes once a day.  He was seen by Dr. Cathie Hoops and had labs done for neutropenia, per her note advised to check CT if weight continued to drop , we ordered CT lung and Abdomen, but insurance denied coverage for CT lung. His weight is now stable and he is feeling well. We will continue to monitor  Other neutropenia  : currently under the care of Dr. Cathie Hoops and reassuring labs and was released from her care in April 2022 but we have been monitoring it yearly. Last level  was down to 2.3 , we will recheck it yearly    BPH and prostate cancer: he is seeing Urologist and denies any obstructive symptoms, except for nocturia about 1-  2 times per night.  Incidental finding of prostate cancer during TURP back in 2015 , last PSA was done June 2021 and it was 2.12 , he is now on yearly follow up with  Urologist  and last PSA was 05/2023 was 2.04. Last visit with Urologist was 06/2023    Dyslipidemia: he is not on statin therapy, myalgia in the past,  currently on Zetia and Vascepa and last LDL was down from 120 to 106. Advised him to continue regular regiment    Chronic constipation: he is doing better since he started taking miralax again , he takes it prn , having a bowel movements  every 2-3 days    CAD  He had a stress test done by Dr. Juliann Pares for evaluation of chest pain back in 11/2018 and results were normal . He goes every 6 months and next visit is in Nov 2024.  He states chest pain under control with medications, very seldom has chest pain , he had a stent placed 2004.    May 9 th 2022  NORMAL LEFT VENTRICULAR SYSTOLIC FUNCTION  NORMAL RIGHT VENTRICULAR SYSTOLIC FUNCTION  MILD VALVULAR REGURGITATION (See above)  NO VALVULAR STENOSIS  ESTIMATED LVEF >55%    11/2018:  Normal myocardial perfusion scan no evidence of stress-induced  myocardial ischemia ejection fraction of 64% conclusion negative scan.   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/21/2020   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) 06/21/2020   Cervical pain (neck) 10/13/2016   Other neutropenia (HCC) 01/07/2016   Carotid artery narrowing 04/08/2015   Chronic constipation 04/08/2015  H/O inguinal hernia repair 04/08/2015   Hyperlipidemia associated with type 2 diabetes mellitus (HCC) 04/08/2015   Low back pain 04/08/2015   MI (mitral incompetence) 04/08/2015   TI (tricuspid incompetence) 04/08/2015   Unilateral recurrent femoral hernia without obstruction or gangrene 05/23/2014   Right inguinal hernia 02/06/2014   Cataract 01/16/2014   Glaucoma 01/16/2014   Reflux esophagitis 06/21/2009   Atherosclerosis of native coronary artery of native heart without angina pectoris 04/03/2009   Benign enlargement of prostate 02/11/2009   Decreased libido 01/12/2008    Past Surgical History:  Procedure Laterality Date   APPENDECTOMY   1954   BLADDER SURGERY  Oct 2015   CARDIAC CATHETERIZATION  2002   1 stent   COLONOSCOPY     EYE SURGERY  2011, 2013   cataracts   EYE SURGERY Right May 2016   glaucoma   HERNIA REPAIR Left 1967   HERNIA REPAIR Right 05-10-14   Right femoral hernia repair Dr Lemar Livings with PerFix plug, no onlay mesh.Marland Kitchen    PHOTOCOAGULATION WITH LASER Right 12/30/2015   Procedure: PHOTOCOAGULATION WITH LASER;  Surgeon: Sherald Hess, MD;  Location: [facility];  Service: Ophthalmology;  Laterality: Right;  DIABETIC - oral meds IVA BLOCK   PROSTATE SURGERY  Oct 2015    Family History  Problem Relation Age of Onset   Diabetes Sister    Diabetes Brother    Diabetes Sister    Prostate cancer Neg Hx    Bladder Cancer Neg Hx     Social History   Tobacco Use   Smoking status: Never   Smokeless tobacco: Never   Tobacco comments:    smoking cessation materials not required  Substance Use Topics   Alcohol use: No     Current Outpatient Medications:    Accu-Chek Softclix Lancets lancets, TEST 4 TIMES DAILY, Disp: 100 each, Rfl: 5   aspirin 81 MG tablet, Take 81 mg by mouth daily. , Disp: , Rfl:    Blood Glucose Monitoring Suppl (ACCU-CHEK GUIDE ME) w/Device KIT, , Disp: , Rfl:    brimonidine-timolol (COMBIGAN) 0.2-0.5 % ophthalmic solution, Place 1 drop into both eyes every 12 (twelve) hours., Disp: , Rfl:    celecoxib (CELEBREX) 100 MG capsule, Take 1 capsule (100 mg total) by mouth 2 (two) times daily., Disp: 30 capsule, Rfl: 0   Cholecalciferol (VITAMIN D) 50 MCG (2000 UT) CAPS, Take 1 capsule (2,000 Units total) by mouth daily., Disp: 30 capsule, Rfl: 0   clotrimazole-betamethasone (LOTRISONE) cream, Apply 1 application. topically at bedtime., Disp: 30 g, Rfl: 0   Cyanocobalamin (B-12) 1000 MCG SUBL, Place 1 tablet under the tongue daily., Disp: 30 tablet, Rfl: 0   docusate sodium (COLACE) 100 MG capsule, Take 100 mg by mouth daily., Disp: , Rfl:    dorzolamide (TRUSOPT) 2 %  ophthalmic solution, 1 drop 2 (two) times daily. , Disp: , Rfl:    ezetimibe (ZETIA) 10 MG tablet, TAKE 1 TABLET BY MOUTH ONCE DAILY, Disp: 90 tablet, Rfl: 1   Garlic 10 MG CAPS, Take 15 mg by mouth daily. , Disp: , Rfl:    glucose blood (ACCU-CHEK GUIDE) test strip, USE 4 TIMES A DAY AS DIRECTED, Disp: 100 each, Rfl: 10   icosapent Ethyl (VASCEPA) 1 g capsule, TAKE 2 CAPSULES BY MOUTH TWICE DAILY, Disp: 120 capsule, Rfl: 0   ketoconazole (NIZORAL) 2 % cream, Apply to both feet and between toes once daily for 6 weeks., Disp: 60 g, Rfl: 1  latanoprost (XALATAN) 0.005 % ophthalmic solution, Place 1 drop into both eyes at bedtime. , Disp: , Rfl:    Magnesium 100 MG TABS, Take by mouth. , Disp: , Rfl:    metFORMIN (GLUCOPHAGE-XR) 750 MG 24 hr tablet, TAKE 1 TABLET BY MOUTH ONCE DAILY WITH BREAKFAST, Disp: 90 tablet, Rfl: 0   Multiple Vitamin (MULTIVITAMIN) tablet, Take 1 tablet by mouth daily., Disp: , Rfl:    pioglitazone (ACTOS) 15 MG tablet, Take 1 tablet (15 mg total) by mouth daily., Disp: 90 tablet, Rfl: 0   polyethylene glycol powder (GLYCOLAX/MIRALAX) 17 GM/SCOOP powder, Take 17 g by mouth daily., Disp: 3350 g, Rfl: 1   sildenafil (REVATIO) 20 MG tablet, Take 1-5 tablets one hour prior to intercourse as needed., Disp: 30 tablet, Rfl: 6   triamcinolone (KENALOG) 0.025 % cream, Apply topically., Disp: , Rfl:    vitamin C (ASCORBIC ACID) 500 MG tablet, Take 500 mg by mouth 2 (two) times daily. Chewable, Disp: , Rfl:    Zinc Acetate 50 MG CAPS, Take 1 capsule by mouth daily. , Disp: , Rfl:   Allergies  Allergen Reactions   Colesevelam Other (See Comments)   Penicillins Itching and Other (See Comments)   Statins Other (See Comments)    myalgia   Welchol [Colesevelam Hcl]     I personally reviewed active problem list, medication list, allergies, family history, social history, health maintenance with the patient/caregiver today.   ROS  Ten systems reviewed and is negative except as  mentioned in HPI     Objective  Vitals:   08/10/23 1132  BP: 124/68  Pulse: 63  Resp: 16  SpO2: 99%  Weight: 125 lb (56.7 kg)  Height: 5\' 8"  (1.727 m)    Body mass index is 19.01 kg/m.  Physical Exam  Constitutional: Patient appears well-developed and well-nourished.  No distress.  HEENT: head atraumatic, normocephalic, pupils equal and reactive to light, neck supple Cardiovascular: Normal rate, regular rhythm and normal heart sounds.  No murmur heard. No BLE edema. Pulmonary/Chest: Effort normal and breath sounds normal. No respiratory distress. Abdominal: Soft.  There is no tenderness. Psychiatric: Patient has a normal mood and affect. behavior is normal. Judgment and thought content normal.    PHQ2/9:    08/10/2023   11:33 AM 06/16/2023    9:54 AM 03/24/2023   10:15 AM 02/19/2023    1:12 PM 11/20/2022    9:52 AM  Depression screen PHQ 2/9  Decreased Interest 0 0 0 0 0  Down, Depressed, Hopeless 0 0 0 0 0  PHQ - 2 Score 0 0 0 0 0  Altered sleeping 0 0 0 0 0  Tired, decreased energy 0 0 0 0 0  Change in appetite 0 0 0 0 0  Feeling bad or failure about yourself  0 0 0 0 0  Trouble concentrating 0 0 0 0 0  Moving slowly or fidgety/restless 0 0 0 0 0  Suicidal thoughts 0 0 0 0 0  PHQ-9 Score 0 0 0 0 0    phq 9 is negative   Fall Risk:    08/10/2023   11:33 AM 06/16/2023    9:54 AM 03/24/2023   10:15 AM 02/19/2023    1:12 PM 11/20/2022    9:52 AM  Fall Risk   Falls in the past year? 0 0 1 0 0  Number falls in past yr: 0 0 0 0 0  Injury with Fall? 0 0 1 0 0  Risk  for fall due to : No Fall Risks No Fall Risks History of fall(s) No Fall Risks No Fall Risks  Follow up Falls prevention discussed Falls prevention discussed Falls prevention discussed;Education provided;Falls evaluation completed Falls prevention discussed Falls prevention discussed      Functional Status Survey: Is the patient deaf or have difficulty hearing?: No Does the patient have difficulty  seeing, even when wearing glasses/contacts?: No Does the patient have difficulty concentrating, remembering, or making decisions?: No Does the patient have difficulty walking or climbing stairs?: No Does the patient have difficulty dressing or bathing?: No Does the patient have difficulty doing errands alone such as visiting a doctor's office or shopping?: No    Assessment & Plan  1. Type 2 diabetes, controlled, with peripheral neuropathy (HCC)  - POCT HgB A1C - pioglitazone (ACTOS) 15 MG tablet; Take 1 tablet (15 mg total) by mouth daily.  Dispense: 90 tablet; Refill: 1 - metFORMIN (GLUCOPHAGE-XR) 500 MG 24 hr tablet; Take 1 tablet (500 mg total) by mouth daily with breakfast.  Dispense: 90 tablet; Refill: 1  2. Need for immunization against influenza  - Flu Vaccine Trivalent High Dose (Fluad)  3. 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  Dispense: 90 tablet; Refill: 1 - metFORMIN (GLUCOPHAGE-XR) 500 MG 24 hr tablet; Take 1 tablet (500 mg total) by mouth daily with breakfast.  Dispense: 90 tablet; Refill: 1  4. Hyperlipidemia associated with type 2 diabetes mellitus (HCC)  - icosapent Ethyl (VASCEPA) 1 g capsule; Take 2 capsules (2 g total) by mouth 2 (two) times daily.  Dispense: 480 capsule; Refill: 1   5. Other neutropenia (HCC)  We will recheck next visit   6. Vitamin D deficiency  Continue supplementation  7. Statin myopathy  Taking zetia  8. Chronic constipation  Continue prn medications  9. Atherosclerosis of native coronary artery of native heart without angina pectoris   Under the care of cardiologist

## 2023-08-10 ENCOUNTER — Ambulatory Visit (INDEPENDENT_AMBULATORY_CARE_PROVIDER_SITE_OTHER): Payer: Medicare Other | Admitting: Family Medicine

## 2023-08-10 ENCOUNTER — Encounter: Payer: Self-pay | Admitting: Family Medicine

## 2023-08-10 VITALS — BP 124/68 | HR 63 | Resp 16 | Ht 68.0 in | Wt 125.0 lb

## 2023-08-10 DIAGNOSIS — E1142 Type 2 diabetes mellitus with diabetic polyneuropathy: Secondary | ICD-10-CM

## 2023-08-10 DIAGNOSIS — H42 Glaucoma in diseases classified elsewhere: Secondary | ICD-10-CM

## 2023-08-10 DIAGNOSIS — G720 Drug-induced myopathy: Secondary | ICD-10-CM | POA: Diagnosis not present

## 2023-08-10 DIAGNOSIS — E559 Vitamin D deficiency, unspecified: Secondary | ICD-10-CM | POA: Diagnosis not present

## 2023-08-10 DIAGNOSIS — K5909 Other constipation: Secondary | ICD-10-CM

## 2023-08-10 DIAGNOSIS — Z23 Encounter for immunization: Secondary | ICD-10-CM | POA: Diagnosis not present

## 2023-08-10 DIAGNOSIS — E1169 Type 2 diabetes mellitus with other specified complication: Secondary | ICD-10-CM

## 2023-08-10 DIAGNOSIS — D708 Other neutropenia: Secondary | ICD-10-CM

## 2023-08-10 DIAGNOSIS — E1136 Type 2 diabetes mellitus with diabetic cataract: Secondary | ICD-10-CM | POA: Diagnosis not present

## 2023-08-10 DIAGNOSIS — E1139 Type 2 diabetes mellitus with other diabetic ophthalmic complication: Secondary | ICD-10-CM | POA: Diagnosis not present

## 2023-08-10 DIAGNOSIS — E785 Hyperlipidemia, unspecified: Secondary | ICD-10-CM

## 2023-08-10 DIAGNOSIS — I2510 Atherosclerotic heart disease of native coronary artery without angina pectoris: Secondary | ICD-10-CM

## 2023-08-10 LAB — POCT GLYCOSYLATED HEMOGLOBIN (HGB A1C): Hemoglobin A1C: 6.7 % — AB (ref 4.0–5.6)

## 2023-08-10 MED ORDER — METFORMIN HCL ER 500 MG PO TB24
500.0000 mg | ORAL_TABLET | Freq: Every day | ORAL | 1 refills | Status: DC
Start: 2023-08-10 — End: 2024-02-10

## 2023-08-10 MED ORDER — PIOGLITAZONE HCL 15 MG PO TABS
15.0000 mg | ORAL_TABLET | Freq: Every day | ORAL | 1 refills | Status: DC
Start: 2023-08-10 — End: 2024-03-16

## 2023-08-10 MED ORDER — ICOSAPENT ETHYL 1 G PO CAPS
2.0000 g | ORAL_CAPSULE | Freq: Two times a day (BID) | ORAL | 1 refills | Status: DC
Start: 2023-08-10 — End: 2024-03-16

## 2023-08-24 NOTE — Progress Notes (Unsigned)
08/26/2023 1:14 PM   Austin Hunt 14-Sep-1941 [id number]  Referring provider: Alba Cory, MD [address] [place],  [place] [address]  Urological history: 1. Prostate cancer  -PSA (05/2023) 2.04 -Gleason 3+3 identified on TURP specimen in 2015   2. Combined arterial insufficiency and corporo-venous occlusive erectile dysfunction -Contributing factors of age, prostate cancer, pelvic surgery, diabetes, hyperlipidemia, neuropathy, CAD and BPH - Managed on sildenafil 20 mg, on-demand dosing  3. Nephrolithiasis -non contrast CT (03/2023) - tiny bilateral calculi   4. BPH with LU TS -prostate volume ~ 65 cc on 2024 CT   Chief Complaint  Patient presents with   Dysuria   HPI: Austin Hunt is a 82 y.o. male who presents today for burning.   Previous records reviewed.   CT (03/2023) - no hydro, bilateral stones   He is actually not experiencing burning but weak urinary stream when he gets up to void at night.  Patient denies any modifying or aggravating factors.  Patient denies any recent UTI's, gross hematuria, dysuria or suprapubic/flank pain.  Patient denies any fevers, chills, nausea or vomiting.    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)     Surgical History: Past Surgical History:  Procedure Laterality Date   APPENDECTOMY  1954   BLADDER SURGERY  Oct 2015   CARDIAC CATHETERIZATION  2002   1 stent   COLONOSCOPY     EYE SURGERY  2011, 2013   cataracts   EYE SURGERY Right May 2016   glaucoma   HERNIA REPAIR Left 1967   HERNIA REPAIR Right 05-10-14   Right femoral hernia repair Dr Lemar Livings with PerFix plug, no onlay mesh.Marland Kitchen    PHOTOCOAGULATION  WITH LASER Right 12/30/2015   Procedure: PHOTOCOAGULATION WITH LASER;  Surgeon: Sherald Hess, MD;  Location: [facility];  Service: Ophthalmology;  Laterality: Right;  DIABETIC - oral meds IVA BLOCK   PROSTATE SURGERY  Oct 2015    Home Medications:  Allergies as of 08/26/2023       Reactions   Colesevelam Other (See Comments)   Penicillins Itching, Other (See Comments)   Statins Other (See Comments)   myalgia   Welchol [colesevelam Hcl]         Medication List        Accurate as of August 26, 2023  1:14 PM. If you have any questions, ask your nurse or doctor.          Accu-Chek Guide Me w/Device Kit   Accu-Chek Guide test strip Generic drug: glucose blood USE 4 TIMES A DAY AS DIRECTED   Accu-Chek Softclix Lancets lancets TEST 4 TIMES DAILY   ascorbic acid 500 MG tablet Commonly known as: VITAMIN C Take 500 mg by mouth 2 (two) times daily. Chewable   aspirin 81 MG tablet Take 81 mg by mouth daily.   B-12 1000 MCG Subl Place 1 tablet under the tongue daily.   brimonidine-timolol 0.2-0.5 % ophthalmic solution Commonly known as: COMBIGAN Place 1 drop into both eyes every 12 (twelve) hours.   clotrimazole-betamethasone cream Commonly  known as: Lotrisone Apply 1 application. topically at bedtime.   docusate sodium 100 MG capsule Commonly known as: COLACE Take 100 mg by mouth daily.   dorzolamide 2 % ophthalmic solution Commonly known as: TRUSOPT 1 drop 2 (two) times daily.   ezetimibe 10 MG tablet Commonly known as: ZETIA TAKE 1 TABLET BY MOUTH ONCE DAILY   Garlic 10 MG Caps Take 15 mg by mouth daily.   icosapent Ethyl 1 g capsule Commonly known as: Vascepa Take 2 capsules (2 g total) by mouth 2 (two) times daily.   ketoconazole 2 % cream Commonly known as: NIZORAL Apply to both feet and between toes once daily for 6 weeks.   latanoprost 0.005 % ophthalmic solution Commonly known as: XALATAN Place 1 drop into both eyes at  bedtime.   Magnesium 100 MG Tabs Take by mouth.   metFORMIN 500 MG 24 hr tablet Commonly known as: GLUCOPHAGE-XR Take 1 tablet (500 mg total) by mouth daily with breakfast.   multivitamin tablet Take 1 tablet by mouth daily.   pioglitazone 15 MG tablet Commonly known as: ACTOS Take 1 tablet (15 mg total) by mouth daily.   polyethylene glycol powder 17 GM/SCOOP powder Commonly known as: GLYCOLAX/MIRALAX Take 17 g by mouth daily.   sildenafil 20 MG tablet Commonly known as: REVATIO Take 1-5 tablets one hour prior to intercourse as needed.   tamsulosin 0.4 MG Caps capsule Commonly known as: FLOMAX Take 1 capsule (0.4 mg total) by mouth daily.   triamcinolone 0.025 % cream Commonly known as: KENALOG Apply topically.   Vitamin D 50 MCG (2000 UT) Caps Take 1 capsule (2,000 Units total) by mouth daily.   Zinc Acetate 50 MG Caps Take 1 capsule by mouth daily.        Allergies:  Allergies  Allergen Reactions   Colesevelam Other (See Comments)   Penicillins Itching and Other (See Comments)   Statins Other (See Comments)    myalgia   Welchol [Colesevelam Hcl]     Family History: Family History  Problem Relation Age of Onset   Diabetes Sister    Diabetes Brother    Diabetes Sister    Prostate cancer Neg Hx    Bladder Cancer Neg Hx     Social History:  reports that he has never smoked. He has never used smokeless tobacco. He reports that he does not drink alcohol and does not use drugs.  ROS: Pertinent ROS in HPI  Physical Exam: BP (!) 150/67   Pulse (!) 54   Ht 5\' 8"  (1.727 m)   Wt 125 lb (56.7 kg)   BMI 19.01 kg/m   Constitutional:  Well nourished. Alert and oriented, No acute distress. HEENT: [place] AT, moist mucus membranes.  Trachea midline Cardiovascular: No clubbing, cyanosis, or edema. Respiratory: Normal respiratory effort, no increased work of breathing. Neurologic: Grossly intact, no focal deficits, moving all 4 extremities. Psychiatric: Normal  mood and affect.  Laboratory Data: Lab Results  Component Value Date   WBC 2.3 (L) 11/20/2022   HGB 14.1 11/20/2022   HCT 41.2 11/20/2022   MCV 88.2 11/20/2022   PLT 224 11/20/2022    Lab Results  Component Value Date   CREATININE 0.85 03/24/2023    Lab Results  Component Value Date   HGBA1C 6.7 (A) 08/10/2023       Component Value Date/Time   CHOL 180 11/20/2022 1017   CHOL 243 (H) 04/08/2015 0919   HDL 60 11/20/2022 1017   HDL 55 04/08/2015  0919   CHOLHDL 3.0 11/20/2022 1017   VLDL 24 08/27/2016 0914   LDLCALC 106 (H) 11/20/2022 1017    Lab Results  Component Value Date   AST 21 03/24/2023   Lab Results  Component Value Date   ALT 13 03/24/2023    Urinalysis See EPIC and HPI I have reviewed the labs.   Pertinent Imaging: CLINICAL DATA:  Unintentional weight loss of approximately 10 lb in past 3 months. * Tracking Code: BO *   EXAM: CT ABDOMEN AND PELVIS WITHOUT CONTRAST   TECHNIQUE: Multidetector CT imaging of the abdomen and pelvis was performed following the standard protocol without IV contrast.   RADIATION DOSE REDUCTION: This exam was performed according to the departmental dose-optimization program which includes automated exposure control, adjustment of the mA and/or kV according to patient size and/or use of iterative reconstruction technique.   COMPARISON:  11/07/2013   FINDINGS: Lower chest: No acute findings. Stable tiny sub-cm nodules in right lung base, consistent with benign etiology.   Hepatobiliary: No mass visualized on this unenhanced exam. Gallbladder is unremarkable. No evidence of biliary ductal dilatation.   Pancreas: No mass or inflammatory process visualized on this unenhanced exam.   Spleen:  Within normal limits in size.   Adrenals/Urinary tract: Few tiny less than 5 mm renal calculi are noted bilaterally. No evidence of ureteral calculi or hydronephrosis. Unremarkable unopacified urinary bladder.    Stomach/Bowel: No evidence of obstruction, inflammatory process, or abnormal fluid collections.   Vascular/Lymphatic: No pathologically enlarged lymph nodes identified. No evidence of abdominal aortic aneurysm. Aortic atherosclerotic calcification incidentally noted.   Reproductive:  Mildly enlarged prostate gland again noted.   Other: Tiny left inguinal hernia again seen, which contains only fat.   Musculoskeletal:  No suspicious bone lesions identified.   IMPRESSION: No acute findings.   Tiny bilateral renal calculi. No evidence of ureteral calculi or hydronephrosis.   Stable mildly enlarged prostate.   Stable tiny left inguinal hernia, which contains only fat.     Electronically Signed   By: Danae Orleans M.D.   On: 04/04/2023 15:00   08/26/23 13:11  Scan Result 196 ml   I have independently reviewed the films.   See HPI.   Assessment & Plan:    1. Nocturia -UA unremarkable  -urine culture sent -We will go ahead and restart tamsulosin 0.4 mg nightly  2. Prostate cancer -low grade -PSA in 12/2022 stable  3. BPH with LU TS -Continue conservative management  4. Nephrolithiasis  -Asymptomatic  Return in about 1 month (around 09/26/2023) for I PSS, PVR.  These notes generated with voice recognition software. I apologize for typographical errors.  Cloretta Ned  [facility] [address]  [address] [place], [place] [address] [phone]

## 2023-08-26 ENCOUNTER — Encounter: Payer: Self-pay | Admitting: Urology

## 2023-08-26 ENCOUNTER — Ambulatory Visit (INDEPENDENT_AMBULATORY_CARE_PROVIDER_SITE_OTHER): Payer: Medicare Other | Admitting: Urology

## 2023-08-26 VITALS — BP 150/67 | HR 54 | Ht 68.0 in | Wt 125.0 lb

## 2023-08-26 DIAGNOSIS — N401 Enlarged prostate with lower urinary tract symptoms: Secondary | ICD-10-CM | POA: Diagnosis not present

## 2023-08-26 DIAGNOSIS — C61 Malignant neoplasm of prostate: Secondary | ICD-10-CM

## 2023-08-26 DIAGNOSIS — N200 Calculus of kidney: Secondary | ICD-10-CM

## 2023-08-26 DIAGNOSIS — R351 Nocturia: Secondary | ICD-10-CM | POA: Diagnosis not present

## 2023-08-26 DIAGNOSIS — R300 Dysuria: Secondary | ICD-10-CM

## 2023-08-26 DIAGNOSIS — N138 Other obstructive and reflux uropathy: Secondary | ICD-10-CM

## 2023-08-26 LAB — MICROSCOPIC EXAMINATION
Bacteria, UA: NONE SEEN
RBC, Urine: NONE SEEN /[HPF] (ref 0–2)

## 2023-08-26 LAB — URINALYSIS, COMPLETE
Bilirubin, UA: NEGATIVE
Ketones, UA: NEGATIVE
Leukocytes,UA: NEGATIVE
Nitrite, UA: NEGATIVE
Protein,UA: NEGATIVE
RBC, UA: NEGATIVE
Specific Gravity, UA: 1.01 (ref 1.005–1.030)
Urobilinogen, Ur: 0.2 mg/dL (ref 0.2–1.0)
pH, UA: 6.5 (ref 5.0–7.5)

## 2023-08-26 LAB — BLADDER SCAN AMB NON-IMAGING: Scan Result: 196

## 2023-08-26 MED ORDER — TAMSULOSIN HCL 0.4 MG PO CAPS
0.4000 mg | ORAL_CAPSULE | Freq: Every day | ORAL | 3 refills | Status: DC
Start: 2023-08-26 — End: 2024-03-14

## 2023-08-27 ENCOUNTER — Ambulatory Visit (INDEPENDENT_AMBULATORY_CARE_PROVIDER_SITE_OTHER): Payer: Medicare Other

## 2023-08-27 VITALS — Ht 68.0 in | Wt 125.0 lb

## 2023-08-27 DIAGNOSIS — Z0000 Encounter for general adult medical examination without abnormal findings: Secondary | ICD-10-CM

## 2023-08-27 NOTE — Progress Notes (Signed)
Subjective:   Austin Hunt is a 82 y.o. male who presents for Medicare Annual/Subsequent preventive examination.  Visit Complete: Virtual I connected with  Austin Hunt on 08/27/23 by a audio enabled telemedicine application and verified that I am speaking with the correct person using two identifiers.  Patient Location: Home  Provider Location: Home Office  I discussed the limitations of evaluation and management by telemedicine. The patient expressed understanding and agreed to proceed.  Vital Signs: Because this visit was a virtual/telehealth visit, some criteria may be missing or patient reported. Any vitals not documented were not able to be obtained and vitals that have been documented are patient reported.  Patient Medicare AWV questionnaire was completed by the patient on (not done); I have confirmed that all information answered by patient is correct and no changes since this date.  Cardiac Risk Factors include: advanced age (>45men, >13 women);diabetes mellitus;dyslipidemia;male gender    Objective:    Today's Vitals   08/27/23 0943  Weight: 125 lb (56.7 kg)  Height: 5\' 8"  (1.727 m)   Body mass index is 19.01 kg/m.     08/27/2023    9:57 AM 06/03/2021    9:36 AM 02/28/2021    2:48 PM 11/26/2020   11:00 AM 11/12/2020    9:42 AM 05/30/2020    9:40 AM 05/30/2019    9:59 AM  Advanced Directives  Does Patient Have a Medical Advance Directive? Yes No Yes Yes Yes No No  Type of Estate agent of [place];Living will  Living will Living will Living will    Copy of Healthcare Power of Attorney in Chart? No - copy requested        Would patient like information on creating a medical advance directive?  Yes (MAU/Ambulatory/Procedural Areas - Information given)    Yes (MAU/Ambulatory/Procedural Areas - Information given) Yes (MAU/Ambulatory/Procedural Areas - Information given)    Current Medications (verified) Outpatient Encounter Medications as of  08/27/2023  Medication Sig   Accu-Chek Softclix Lancets lancets TEST 4 TIMES DAILY   aspirin 81 MG tablet Take 81 mg by mouth daily.    Blood Glucose Monitoring Suppl (ACCU-CHEK GUIDE ME) w/Device KIT    brimonidine-timolol (COMBIGAN) 0.2-0.5 % ophthalmic solution Place 1 drop into both eyes every 12 (twelve) hours.   Cholecalciferol (VITAMIN D) 50 MCG (2000 UT) CAPS Take 1 capsule (2,000 Units total) by mouth daily.   clotrimazole-betamethasone (LOTRISONE) cream Apply 1 application. topically at bedtime.   Cyanocobalamin (B-12) 1000 MCG SUBL Place 1 tablet under the tongue daily.   docusate sodium (COLACE) 100 MG capsule Take 100 mg by mouth daily.   dorzolamide (TRUSOPT) 2 % ophthalmic solution 1 drop 2 (two) times daily.    ezetimibe (ZETIA) 10 MG tablet TAKE 1 TABLET BY MOUTH ONCE DAILY   Garlic 10 MG CAPS Take 15 mg by mouth daily.    glucose blood (ACCU-CHEK GUIDE) test strip USE 4 TIMES A DAY AS DIRECTED   icosapent Ethyl (VASCEPA) 1 g capsule Take 2 capsules (2 g total) by mouth 2 (two) times daily.   ketoconazole (NIZORAL) 2 % cream Apply to both feet and between toes once daily for 6 weeks.   latanoprost (XALATAN) 0.005 % ophthalmic solution Place 1 drop into both eyes at bedtime.    Magnesium 100 MG TABS Take by mouth.    metFORMIN (GLUCOPHAGE-XR) 500 MG 24 hr tablet Take 1 tablet (500 mg total) by mouth daily with breakfast.   Multiple Vitamin (MULTIVITAMIN)  tablet Take 1 tablet by mouth daily.   pioglitazone (ACTOS) 15 MG tablet Take 1 tablet (15 mg total) by mouth daily.   polyethylene glycol powder (GLYCOLAX/MIRALAX) 17 GM/SCOOP powder Take 17 g by mouth daily.   tamsulosin (FLOMAX) 0.4 MG CAPS capsule Take 1 capsule (0.4 mg total) by mouth daily.   triamcinolone (KENALOG) 0.025 % cream Apply topically.   vitamin C (ASCORBIC ACID) 500 MG tablet Take 500 mg by mouth 2 (two) times daily. Chewable   Zinc Acetate 50 MG CAPS Take 1 capsule by mouth daily.    sildenafil (REVATIO)  20 MG tablet Take 1-5 tablets one hour prior to intercourse as needed. (Patient not taking: Reported on 08/26/2023)   No facility-administered encounter medications on file as of 08/27/2023.    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    Diabetes mellitus without complication (HCC)    Glaucoma    Headache    rare - 1x/mo   Nocturia    Prostate cancer [facility])    Past Surgical History:  Procedure Laterality Date   APPENDECTOMY  1954   BLADDER SURGERY  Oct 2015   CARDIAC CATHETERIZATION  2002   1 stent   COLONOSCOPY     EYE SURGERY  2011, 2013   cataracts   EYE SURGERY Right May 2016   glaucoma   HERNIA REPAIR Left 1967   HERNIA REPAIR Right 05-10-14   Right femoral hernia repair Dr Lemar Livings with PerFix plug, no onlay mesh.Marland Kitchen    PHOTOCOAGULATION WITH LASER Right 12/30/2015   Procedure: PHOTOCOAGULATION WITH LASER;  Surgeon: Sherald Hess, MD;  Location: [facility];  Service: Ophthalmology;  Laterality: Right;  DIABETIC - oral meds IVA BLOCK   PROSTATE SURGERY  Oct 2015   Family History  Problem Relation Age of Onset   Diabetes Sister    Diabetes Brother    Diabetes Sister    Prostate cancer Neg Hx    Bladder Cancer Neg Hx    Social History   Socioeconomic History   Marital status: Widowed    Spouse name: Austin Hunt   Number of children: 4   Years of education: some college   Highest education level: 12th grade  Occupational History    Employer: RETIRED  Tobacco Use   Smoking status: Never   Smokeless tobacco: Never   Tobacco comments:    smoking cessation materials not required  Vaping Use   Vaping status: Never Used  Substance and Sexual Activity   Alcohol use: No   Drug use: No   Sexual activity: Yes  Other Topics  Concern   Not on file  Social History Narrative   Not on file   Social Determinants of Health   Financial Resource Strain: Low Risk  (08/27/2023)   Overall Financial Resource Strain (CARDIA)    Difficulty of Paying Living Expenses: Not hard at all  Food Insecurity: No Food Insecurity (08/27/2023)   Hunger Vital Sign    Worried About Running Out of Food in the Last Year: Never true    Ran Out of Food in the Last Year: Never true  Transportation Needs: No Transportation Needs (08/27/2023)   PRAPARE - Administrator, Civil Service (Medical): No    Lack of Transportation (  Non-Medical): No  Physical Activity: Sufficiently Active (08/27/2023)   Exercise Vital Sign    Days of Exercise per Week: 5 days    Minutes of Exercise per Session: 40 min  Stress: No Stress Concern Present (08/27/2023)   [employer] of Occupational Health - Occupational Stress Questionnaire    Feeling of Stress : Not at all  Social Connections: Moderately Integrated (08/27/2023)   Social Connection and Isolation Panel [NHANES]    Frequency of Communication with Friends and Family: More than three times a week    Frequency of Social Gatherings with Friends and Family: More than three times a week    Attends Religious Services: More than 4 times per year    Active Member of [employer] or Organizations: No    Attends Engineer, structural: Never    Marital Status: Married    Tobacco Counseling Counseling given: Not Answered Tobacco comments: smoking cessation materials not required   Clinical Intake:  Pre-visit preparation completed: Yes  Pain : No/denies pain     BMI - recorded: 19.01 Nutritional Status: BMI of 19-24  Normal Nutritional Risks: None Diabetes: Yes CBG done?: Yes (BS 152 this am at home) CBG resulted in Enter/ Edit results?: No Did pt. bring in CBG monitor from home?: No  How often do you need to have someone help you when you read instructions, pamphlets, or other  written materials from your doctor or pharmacy?: 1 - Never  Interpreter Needed?: No  Comments: son lives with pt Information entered by :: B.Aseret Hoffman,LPN   Activities of Daily Living    08/27/2023    9:58 AM 08/10/2023   11:33 AM  In your present state of health, do you have any difficulty performing the following activities:  Hearing? 0 0  Vision? 0 0  Difficulty concentrating or making decisions? 0 0  Walking or climbing stairs? 0 0  Dressing or bathing? 0 0  Doing errands, shopping? 0 0  Preparing Food and eating ? N   Using the Toilet? N   In the past six months, have you accidently leaked urine? N   Do you have problems with loss of bowel control? N   Managing your Medications? N   Managing your Finances? N   Housekeeping or managing your Housekeeping? N     Patient Care Team: Alba Cory, MD as PCP - General (Family Medicine) Alwyn Pea, MD as Consulting Physician (Cardiology) Vanna Scotland, MD as Consulting Physician (Urology) Lockie Mola, MD as Consulting Physician (Ophthalmology) Helane Gunther, DPM as Consulting Physician (Podiatry) Gaspar Cola, [facility] (Inactive) (Pharmacist) Rickard Patience, MD as Consulting Physician (Oncology) Jesusita Oka, MD (Dermatology)  Indicate any recent Medical Services you may have received from other than [facility] providers in the past year (date may be approximate).     Assessment:   This is a routine wellness examination for [place].  Hearing/Vision screen Hearing Screening - Comments:: Pt says his hearing is good  Vision Screening - Comments:: Pt says his vision is good;readers only Dr Inez Pilgrim   Goals Addressed             This Visit's Progress    DIET - INCREASE WATER INTAKE   Not on track    Recommend to drink at least 6-8 8oz glasses of water per day.     COMPLETED: Exercise   On track    Starting 08/31/16, I will continue exercising 4 times a week for 40 minutes.     COMPLETED:  Monitor  and Manage My Blood Sugar-Diabetes Type 2       Timeframe:  Long-Range Goal Priority:  High Start Date:    01/02/21                         Expected End Date:  01/08/23                     Follow up within 90 days   -check blood sugar daily before breakfast - check blood sugar if I feel it is too high or too low - enter blood sugar readings and medication or insulin into daily log    Why is this important?   Checking your blood sugar at home helps to keep it from getting very high or very low.  Writing the results in a diary or log helps the doctor know how to care for you.  Your blood sugar log should have the time, date and the results.  Also, write down the amount of insulin or other medicine that you take.  Other information, like what you ate, exercise done and how you were feeling, will also be helpful.     Notes:        Depression Screen    08/27/2023    9:55 AM 08/10/2023   11:33 AM 06/16/2023    9:54 AM 03/24/2023   10:15 AM 02/19/2023    1:12 PM 11/20/2022    9:52 AM 08/20/2022   10:11 AM  PHQ 2/9 Scores  PHQ - 2 Score 0 0 0 0 0 0 0  PHQ- 9 Score  0 0 0 0 0 1    Fall Risk    08/27/2023    9:50 AM 08/10/2023   11:33 AM 06/16/2023    9:54 AM 03/24/2023   10:15 AM 02/19/2023    1:12 PM  Fall Risk   Falls in the past year? 0 0 0 1 0  Number falls in past yr: 0 0 0 0 0  Injury with Fall? 0 0 0 1 0  Risk for fall due to : No Fall Risks No Fall Risks No Fall Risks History of fall(s) No Fall Risks  Follow up Education provided;Falls prevention discussed Falls prevention discussed Falls prevention discussed Falls prevention discussed;Education provided;Falls evaluation completed Falls prevention discussed    MEDICARE RISK AT HOME: Medicare Risk at Home Any stairs in or around the home?: Yes If so, are there any without handrails?: Yes Home free of loose throw rugs in walkways, pet beds, electrical cords, etc?: Yes Adequate lighting in your home to reduce risk of  falls?: Yes Life alert?: No Use of a cane, walker or w/c?: No Grab bars in the bathroom?: Yes Shower chair or bench in shower?: Yes Elevated toilet seat or a handicapped toilet?: No  TIMED UP AND GO:  Was the test performed?  No    Cognitive Function:        06/04/2022    8:33 AM 05/30/2020    9:46 AM 05/30/2019   10:01 AM 05/24/2018    8:54 AM 08/31/2016    9:09 AM  6CIT Screen  What Year? 0 points 0 points 0 points 0 points 0 points  What month? 0 points 0 points 0 points 0 points 0 points  What time? 0 points 0 points 0 points 0 points 0 points  Count back from 20 0 points 0 points 0 points 0 points 0 points  Months in reverse  0 points 0 points 0 points 0 points 0 points  Repeat phrase 0 points 0 points 0 points 6 points 0 points  Total Score 0 points 0 points 0 points 6 points 0 points    Immunizations Immunization History  Administered Date(s) Administered   Fluad Quad(high Dose 65+) 08/01/2019, 08/19/2020, 07/22/2021, 08/20/2022   Fluad Trivalent(High Dose 65+) 08/10/2023   Influenza Split 07/02/2010   Influenza, High Dose Seasonal PF 08/14/2015, 07/30/2016, 08/19/2017, 08/09/2018   Influenza, Seasonal, Injecte, Preservative Fre 07/08/2011, 07/22/2012   Influenza,inj,Quad PF,6+ Mos 07/11/2013   PFIZER(Purple Top)SARS-COV-2 Vaccination 11/22/2019, 12/16/2019, 08/04/2020, 01/30/2021, 07/14/2021   PNEUMOCOCCAL CONJUGATE-20 04/20/2022   Pneumococcal Conjugate-13 01/22/2014   Pneumococcal-Unspecified 01/03/2008   Tdap 11/04/2010, 11/05/2020   Zoster Recombinant(Shingrix) 06/25/2020, 10/03/2020   Zoster, Live 06/14/2008    TDAP status: Up to date  Flu Vaccine status: Up to date  Pneumococcal vaccine status: Up to date  Covid-19 vaccine status: Completed vaccines  Qualifies for Shingles Vaccine? Yes   Zostavax completed No   Shingrix Completed?: No.    Education has been provided regarding the importance of this vaccine. Patient has been advised to call insurance  company to determine out of pocket expense if they have not yet received this vaccine. Advised may also receive vaccine at local pharmacy or Health Dept. Verbalized acceptance and understanding.  Screening Tests Health Maintenance  Topic Date Due   COVID-19 Vaccine (6 - 2023-24 season) 09/12/2023 (Originally 07/04/2023)   FOOT EXAM  08/28/2023   Diabetic kidney evaluation - Urine ACR  11/21/2023   HEMOGLOBIN A1C  02/08/2024   Diabetic kidney evaluation - eGFR measurement  03/23/2024   OPHTHALMOLOGY EXAM  04/28/2024   Medicare Annual Wellness (AWV)  08/26/2024   DTaP/Tdap/Td (3 - Td or Tdap) 11/05/2030   Pneumonia Vaccine 3+ Years old  Completed   INFLUENZA VACCINE  Completed   Zoster Vaccines- Shingrix  Completed   HPV VACCINES  Aged Out    Health Maintenance  There are no preventive care reminders to display for this patient.   Colorectal cancer screening: No longer required.   Lung Cancer Screening: (Low Dose CT Chest recommended if Age 62-80 years, 20 pack-year currently smoking OR have quit w/in 15years.) does not qualify.   Lung Cancer Screening Referral: no  Additional Screening:  Hepatitis C Screening: does not qualify; Completed no  Vision Screening: Recommended annual ophthalmology exams for early detection of glaucoma and other disorders of the eye. Is the patient up to date with their annual eye exam?  Yes  Who is the provider or what is the name of the office in which the patient attends annual eye exams? [facility] If pt is not established with a provider, would they like to be referred to a provider to establish care? No .   Dental Screening: Recommended annual dental exams for proper oral hygiene  Diabetic Foot Exam: Diabetic Foot Exam: Completed 11/2022  Community Resource Referral / Chronic Care Management: CRR required this visit?  No   CCM required this visit?  No    Plan:     I have personally reviewed and noted the following in the patient's  chart:   Medical and social history Use of alcohol, tobacco or illicit drugs  Current medications and supplements including opioid prescriptions. Patient is not currently taking opioid prescriptions. Functional ability and status Nutritional status Physical activity Advanced directives List of other physicians Hospitalizations, surgeries, and ER visits in previous 12 months Vitals Screenings to include cognitive, depression, and  falls Referrals and appointments  In addition, I have reviewed and discussed with patient certain preventive protocols, quality metrics, and best practice recommendations. A written personalized care plan for preventive services as well as general preventive health recommendations were provided to patient.    Sue Lush, LPN   [id number]   After Visit Summary: (Declined) Due to this being a telephonic visit, with patients personalized plan was offered to patient but patient Declined AVS at this time   Nurse Notes: The patient states he is doing well and has no concerns or questions at this time.

## 2023-08-27 NOTE — Patient Instructions (Signed)
Austin Hunt , Thank you for taking time to come for your Medicare Wellness Visit. I appreciate your ongoing commitment to your health goals. Please review the following plan we discussed and let me know if I can assist you in the future.   Referrals/Orders/Follow-Ups/Clinician Recommendations: none  This is a list of the screening recommended for you and due dates:  Health Maintenance  Topic Date Due   COVID-19 Vaccine (6 - 2023-24 season) 09/12/2023*   Complete foot exam   08/28/2023   Yearly kidney health urinalysis for diabetes  11/21/2023   Hemoglobin A1C  02/08/2024   Yearly kidney function blood test for diabetes  03/23/2024   Eye exam for diabetics  04/28/2024   Medicare Annual Wellness Visit  08/26/2024   DTaP/Tdap/Td vaccine (3 - Td or Tdap) 11/05/2030   Pneumonia Vaccine  Completed   Flu Shot  Completed   Zoster (Shingles) Vaccine  Completed   HPV Vaccine  Aged Out  *Topic was postponed. The date shown is not the original due date.    Advanced directives: (Copy Requested) Please bring a copy of your health care power of attorney and living will to the office to be added to your chart at your convenience.  Next Medicare Annual Wellness Visit scheduled for next year: Yes  08/31/24 @ 11:35am telephone

## 2023-08-29 LAB — CULTURE, URINE COMPREHENSIVE

## 2023-08-30 DIAGNOSIS — E119 Type 2 diabetes mellitus without complications: Secondary | ICD-10-CM | POA: Diagnosis not present

## 2023-08-30 DIAGNOSIS — H353131 Nonexudative age-related macular degeneration, bilateral, early dry stage: Secondary | ICD-10-CM | POA: Diagnosis not present

## 2023-08-30 DIAGNOSIS — H401133 Primary open-angle glaucoma, bilateral, severe stage: Secondary | ICD-10-CM | POA: Diagnosis not present

## 2023-08-30 LAB — HM DIABETES EYE EXAM

## 2023-09-21 NOTE — Progress Notes (Unsigned)
09/23/2023 12:43 PM   Austin Hunt January 05, 1941 [id number]  Referring provider: Alba Cory, MD [address] [place],  [place] [address]  Urological history: 1. Prostate cancer  -PSA (05/2023) 2.04 -Gleason 3+3 identified on TURP specimen in 2015   2. Combined arterial insufficiency and corporo-venous occlusive erectile dysfunction -Contributing factors of age, prostate cancer, pelvic surgery, diabetes, hyperlipidemia, neuropathy, CAD and BPH - Managed on sildenafil 20 mg, on-demand dosing  3. Nephrolithiasis -non contrast CT (03/2023) - tiny bilateral calculi   4. BPH with LU TS -prostate volume ~ 65 cc on 2024 CT   No chief complaint on file.  HPI: Austin Hunt is a 82 y.o. male who presents today for one month follow up after starting the tamsulosin 0.4 mg for nocturia.    Previous records reviewed.   I PSS ***  PVR ***    Score:  1-7 Mild 8-19 Moderate 20-35 Severe   PMH: 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    Diabetes mellitus without complication (HCC)    Glaucoma    Headache    rare - 1x/mo   Nocturia    Prostate cancer [facility])     Surgical History: Past Surgical History:  Procedure Laterality Date   APPENDECTOMY  1954   BLADDER SURGERY  Oct 2015   CARDIAC CATHETERIZATION  2002   1 stent   COLONOSCOPY     EYE SURGERY  2011, 2013   cataracts   EYE SURGERY Right May 2016   glaucoma   HERNIA REPAIR Left 1967   HERNIA REPAIR Right 05-10-14   Right femoral hernia repair Dr Lemar Livings with PerFix plug, no onlay mesh.Marland Kitchen    PHOTOCOAGULATION WITH LASER Right 12/30/2015   Procedure: PHOTOCOAGULATION WITH LASER;  Surgeon: Sherald Hess, MD;  [facility];  Service: Ophthalmology;  Laterality: Right;  DIABETIC - oral meds IVA BLOCK   PROSTATE SURGERY  Oct 2015    Home  Medications:  Allergies as of 09/23/2023       Reactions   Colesevelam Other (See Comments)   Penicillins Itching, Other (See Comments)   Statins Other (See Comments)   myalgia   Welchol [colesevelam Hcl]         Medication List        Accurate as of September 21, 2023 12:43 PM. If you have any questions, ask your nurse or doctor.          Accu-Chek Guide Me w/Device Kit   Accu-Chek Guide test strip Generic drug: glucose blood USE 4 TIMES A DAY AS DIRECTED   Accu-Chek Softclix Lancets lancets TEST 4 TIMES DAILY   ascorbic acid 500 MG tablet Commonly known as: VITAMIN C Take 500 mg by mouth 2 (two) times daily. Chewable   aspirin 81 MG tablet Take 81 mg by mouth daily.   B-12 1000 MCG Subl Place 1 tablet under the tongue daily.   brimonidine-timolol 0.2-0.5 % ophthalmic solution Commonly known as: COMBIGAN Place 1 drop into both eyes every 12 (twelve) hours.   clotrimazole-betamethasone cream Commonly known as: Lotrisone Apply 1 application. topically at bedtime.   docusate sodium 100 MG capsule Commonly known as: COLACE Take 100 mg by mouth daily.   dorzolamide 2 % ophthalmic solution Commonly known as: TRUSOPT 1 drop 2 (two) times daily.  ezetimibe 10 MG tablet Commonly known as: ZETIA TAKE 1 TABLET BY MOUTH ONCE DAILY   Garlic 10 MG Caps Take 15 mg by mouth daily.   icosapent Ethyl 1 g capsule Commonly known as: Vascepa Take 2 capsules (2 g total) by mouth 2 (two) times daily.   ketoconazole 2 % cream Commonly known as: NIZORAL Apply to both feet and between toes once daily for 6 weeks.   latanoprost 0.005 % ophthalmic solution Commonly known as: XALATAN Place 1 drop into both eyes at bedtime.   Magnesium 100 MG Tabs Take by mouth.   metFORMIN 500 MG 24 hr tablet Commonly known as: GLUCOPHAGE-XR Take 1 tablet (500 mg total) by mouth daily with breakfast.   multivitamin tablet Take 1 tablet by mouth daily.   pioglitazone 15 MG  tablet Commonly known as: ACTOS Take 1 tablet (15 mg total) by mouth daily.   polyethylene glycol powder 17 GM/SCOOP powder Commonly known as: GLYCOLAX/MIRALAX Take 17 g by mouth daily.   sildenafil 20 MG tablet Commonly known as: REVATIO Take 1-5 tablets one hour prior to intercourse as needed.   tamsulosin 0.4 MG Caps capsule Commonly known as: FLOMAX Take 1 capsule (0.4 mg total) by mouth daily.   triamcinolone 0.025 % cream Commonly known as: KENALOG Apply topically.   Vitamin D 50 MCG (2000 UT) Caps Take 1 capsule (2,000 Units total) by mouth daily.   Zinc Acetate 50 MG Caps Take 1 capsule by mouth daily.        Allergies:  Allergies  Allergen Reactions   Colesevelam Other (See Comments)   Penicillins Itching and Other (See Comments)   Statins Other (See Comments)    myalgia   Welchol [Colesevelam Hcl]     Family History: Family History  Problem Relation Age of Onset   Diabetes Sister    Diabetes Brother    Diabetes Sister    Prostate cancer Neg Hx    Bladder Cancer Neg Hx     Social History:  reports that he has never smoked. He has never used smokeless tobacco. He reports that he does not drink alcohol and does not use drugs.  ROS: Pertinent ROS in HPI  Physical Exam: There were no vitals taken for this visit.  Constitutional:  Well nourished. Alert and oriented, No acute distress. HEENT: [place] AT, moist mucus membranes.  Trachea midline, no masses. Cardiovascular: No clubbing, cyanosis, or edema. Respiratory: Normal respiratory effort, no increased work of breathing. GI: Abdomen is soft, non tender, non distended, no abdominal masses. Liver and spleen not palpable.  No hernias appreciated.  Stool sample for occult testing is not indicated.   GU: No CVA tenderness.  No bladder fullness or masses.  Patient with circumcised/uncircumcised phallus. ***Foreskin easily retracted***  Urethral meatus is patent.  No penile discharge. No penile lesions or  rashes. Scrotum without lesions, cysts, rashes and/or edema.  Testicles are located scrotally bilaterally. No masses are appreciated in the testicles. Left and right epididymis are normal. Rectal: Patient with  normal sphincter tone. Anus and perineum without scarring or rashes. No rectal masses are appreciated. Prostate is approximately *** grams, *** nodules are appreciated. Seminal vesicles are normal. Skin: No rashes, bruises or suspicious lesions. Lymph: No cervical or inguinal adenopathy. Neurologic: Grossly intact, no focal deficits, moving all 4 extremities. Psychiatric: Normal mood and affect.   Laboratory Data: N/A  Pertinent Imaging: ***  Assessment & Plan:    1. Nocturia -continue tamsulosin 0.4 mg nightly  2. Prostate  cancer -low grade -PSA in 12/2022 stable  3. BPH with LU TS -Continue conservative management  4. Nephrolithiasis  -Asymptomatic  No follow-ups on file.  These notes generated with voice recognition software. I apologize for typographical errors.  Cloretta Ned  [facility] [address]  [address] [place], [place] [address] [phone]

## 2023-09-23 ENCOUNTER — Ambulatory Visit (INDEPENDENT_AMBULATORY_CARE_PROVIDER_SITE_OTHER): Payer: Medicare Other | Admitting: Urology

## 2023-09-23 ENCOUNTER — Encounter: Payer: Self-pay | Admitting: Urology

## 2023-09-23 DIAGNOSIS — N5203 Combined arterial insufficiency and corporo-venous occlusive erectile dysfunction: Secondary | ICD-10-CM | POA: Diagnosis not present

## 2023-09-23 DIAGNOSIS — N401 Enlarged prostate with lower urinary tract symptoms: Secondary | ICD-10-CM | POA: Diagnosis not present

## 2023-09-23 DIAGNOSIS — R351 Nocturia: Secondary | ICD-10-CM | POA: Diagnosis not present

## 2023-09-23 DIAGNOSIS — C61 Malignant neoplasm of prostate: Secondary | ICD-10-CM

## 2023-09-23 DIAGNOSIS — N138 Other obstructive and reflux uropathy: Secondary | ICD-10-CM

## 2023-09-23 DIAGNOSIS — N200 Calculus of kidney: Secondary | ICD-10-CM | POA: Diagnosis not present

## 2023-09-23 LAB — BLADDER SCAN AMB NON-IMAGING: Scan Result: 2

## 2023-09-23 MED ORDER — SILDENAFIL CITRATE 20 MG PO TABS: ORAL_TABLET | ORAL | 6 refills | Status: DC

## 2023-09-24 ENCOUNTER — Telehealth: Payer: Self-pay | Admitting: *Deleted

## 2023-09-24 NOTE — Telephone Encounter (Signed)
Received fax from [facility] asking for prior auth for sildenafil 20mg . This medication is cash pay   .left message to have patient return my call.

## 2023-09-27 NOTE — Telephone Encounter (Signed)
Austin Hunt  left message to have patient return my call.

## 2023-09-27 NOTE — Telephone Encounter (Signed)
Pt returned DS Call.

## 2023-09-28 NOTE — Telephone Encounter (Signed)
Last and final call, .left message to have patient return my call.

## 2023-09-28 NOTE — Telephone Encounter (Signed)
Pt states he should have a rx for Viagra at the pharmacy. He only picked up one prescription and he took all of it.   Advised pt that Sildenafil was erxed. Generic of Viagra. This medication is cash pay at the pharmacy. He will need to contact the pharmacy for pricing.   Per SM last note pt needs to continue with Tamsulosin at bedtime. Pt advise to pick up med at pharmacy.   Pt seemed very confused.

## 2023-10-14 IMAGING — US US SOFT TISSUE HEAD/NECK
1 series · 9 of 9 positions shown · non-contrast
Comparison: None.

CLINICAL DATA: Neck pain, possible left neck nodule

EXAM:
ULTRASOUND OF HEAD/NECK SOFT TISSUES
TECHNIQUE: Ultrasound examination of the head and neck soft tissues was
performed in the area of clinical concern.

[Series 1: us soft tissue head/neck · 0.06mm/px · 9 acquisitions, 9 frames shown]
[im 1/9]
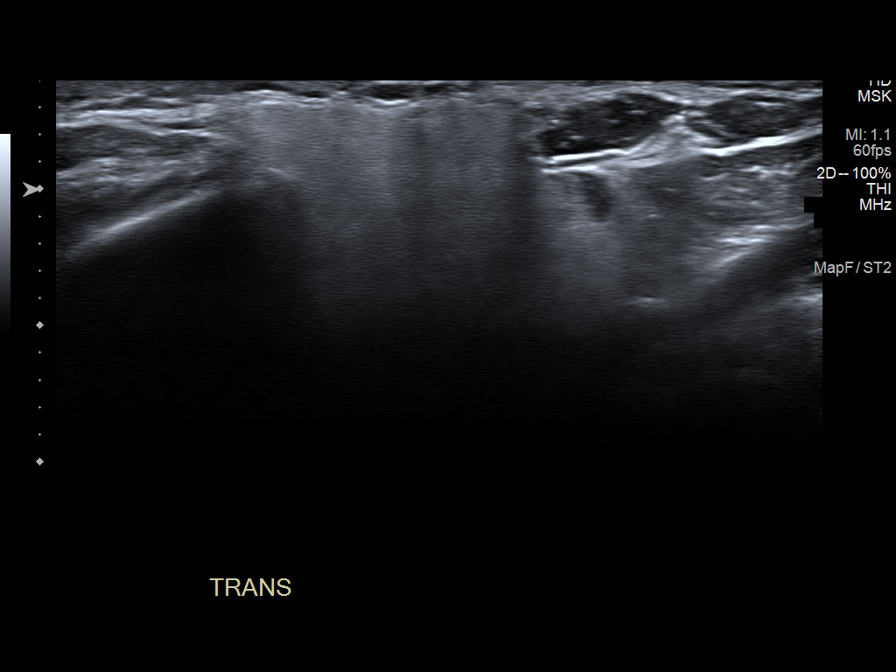
[im 2/9]
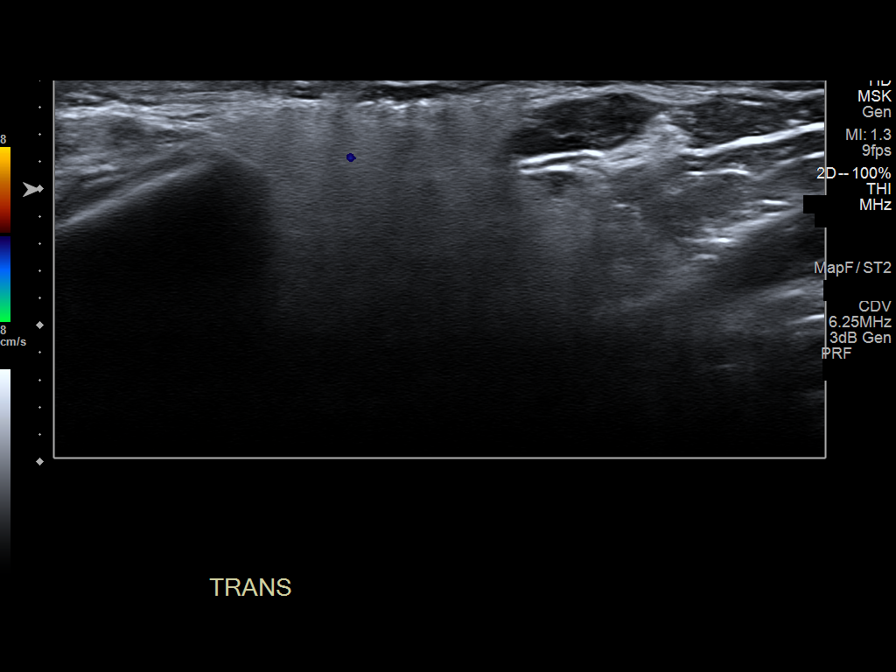
[im 3/9]
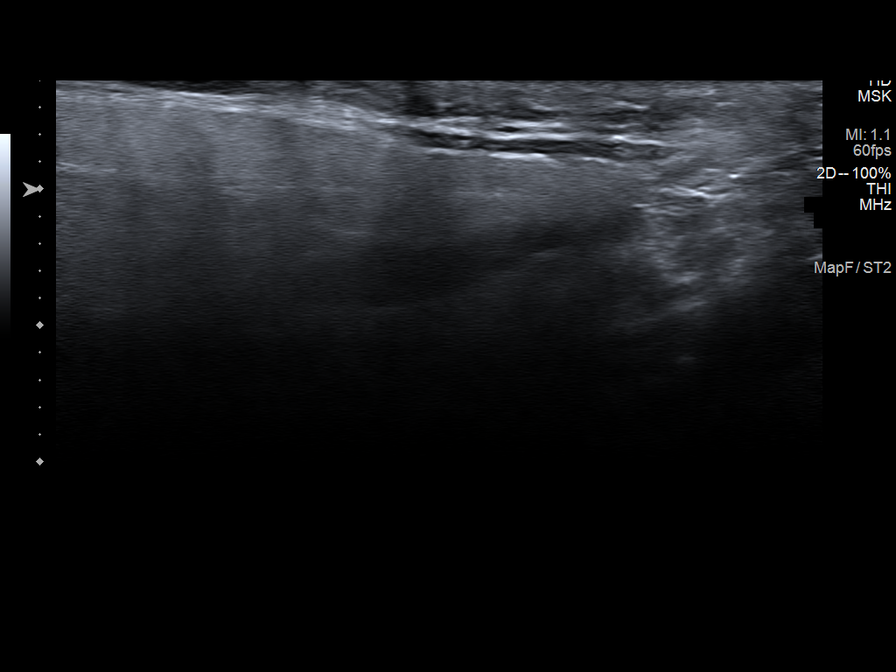
[im 4/9]
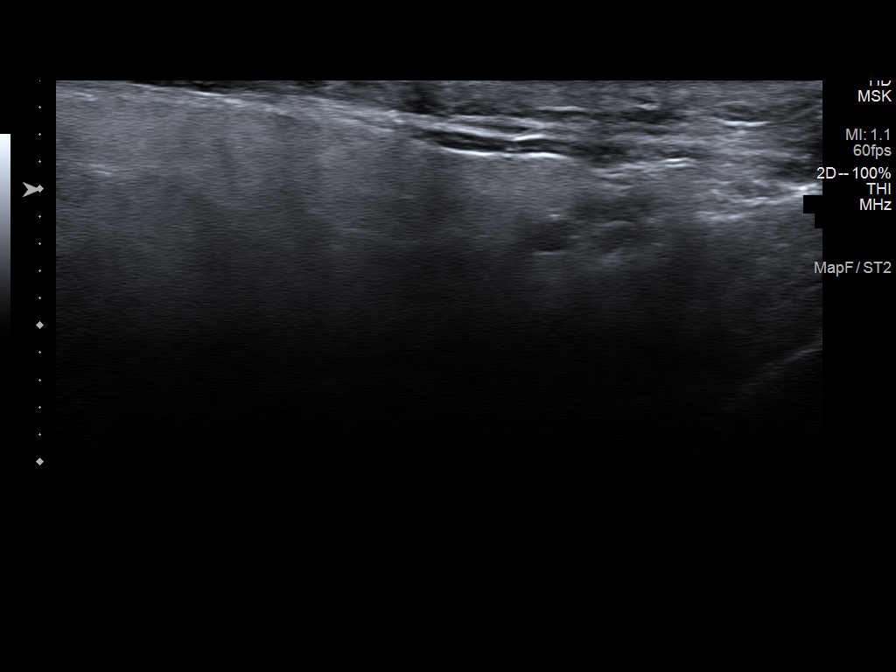
[im 5/9]
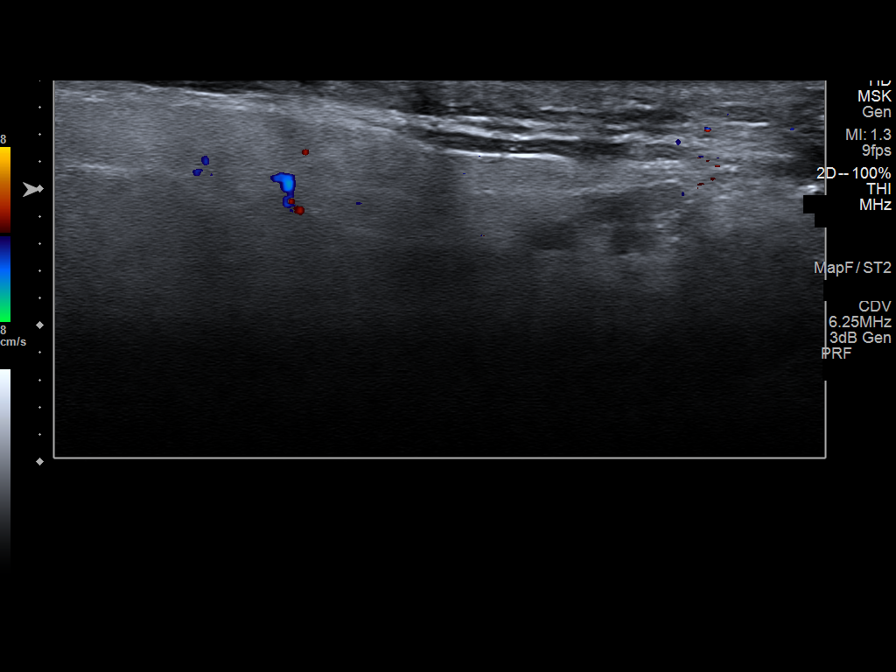
[im 6/9]
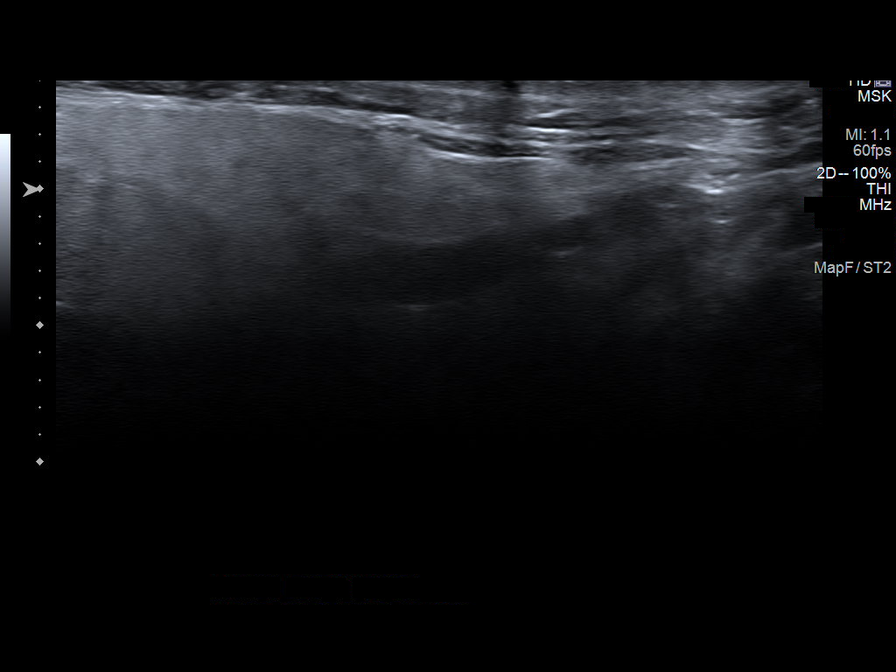
[im 7/9]
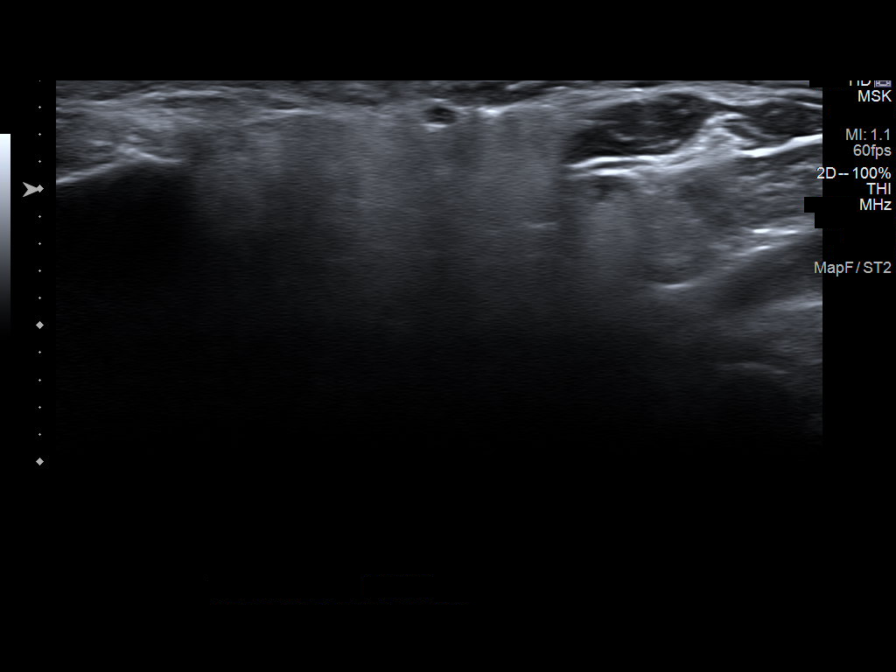
[im 8/9]
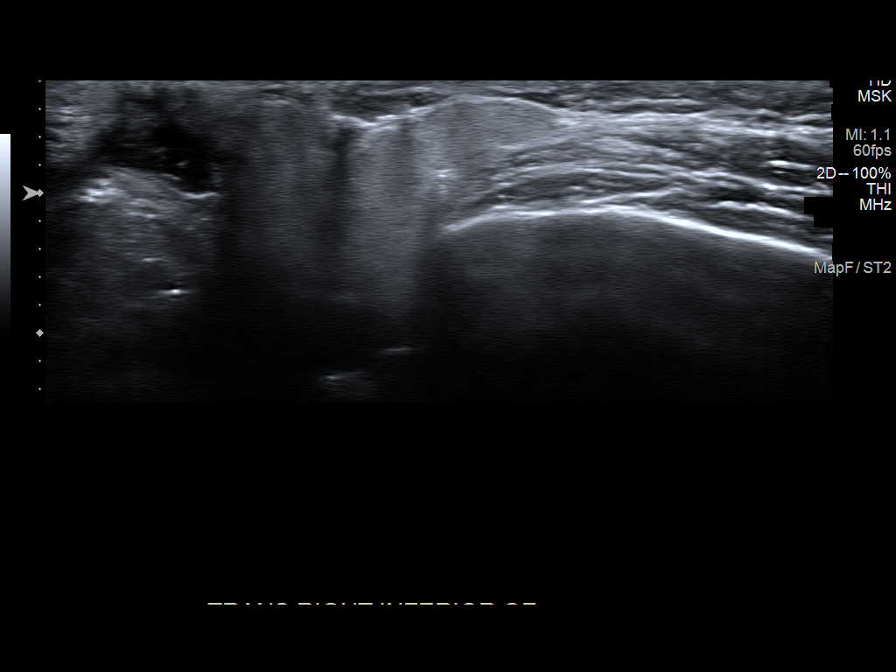
[im 9/9]
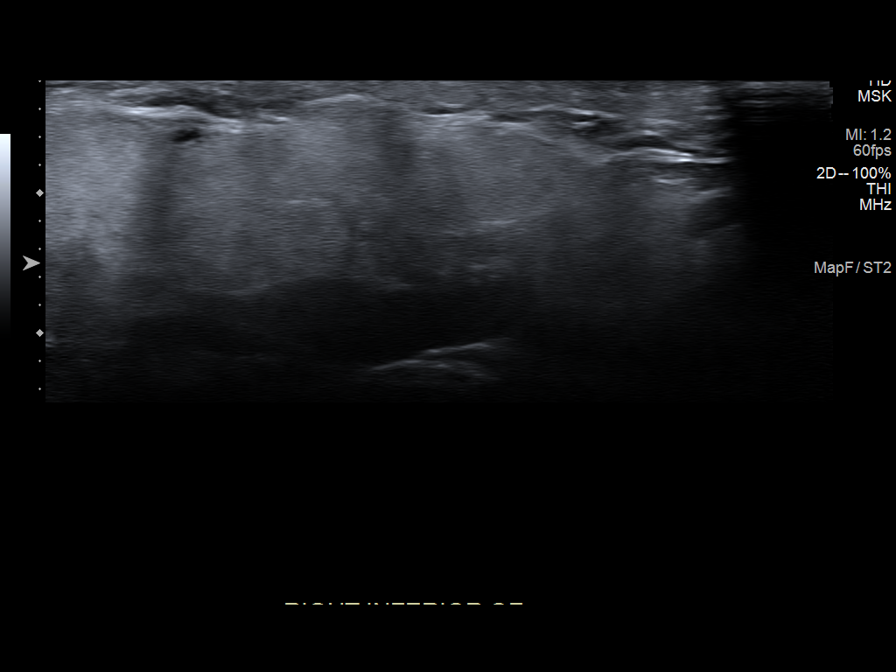

[9 of 9 positions shown; findings below may reference images not displayed]

FINDINGS: Sonographic interrogation of the region of clinical concern
demonstrates a normal appearing left parotid gland and subcutaneous
fat. No lymphadenopathy, mass lesion or cystic abnormality
identified.
IMPRESSION: Negative sonographic survey of the region of clinical concern.

## 2023-11-01 DIAGNOSIS — E7800 Pure hypercholesterolemia, unspecified: Secondary | ICD-10-CM | POA: Diagnosis not present

## 2023-11-01 DIAGNOSIS — R001 Bradycardia, unspecified: Secondary | ICD-10-CM | POA: Diagnosis not present

## 2023-11-01 DIAGNOSIS — R0602 Shortness of breath: Secondary | ICD-10-CM | POA: Diagnosis not present

## 2023-11-01 DIAGNOSIS — Z955 Presence of coronary angioplasty implant and graft: Secondary | ICD-10-CM | POA: Diagnosis not present

## 2023-11-01 DIAGNOSIS — I10 Essential (primary) hypertension: Secondary | ICD-10-CM | POA: Diagnosis not present

## 2023-11-01 DIAGNOSIS — M353 Polymyalgia rheumatica: Secondary | ICD-10-CM | POA: Diagnosis not present

## 2023-11-01 DIAGNOSIS — E119 Type 2 diabetes mellitus without complications: Secondary | ICD-10-CM | POA: Diagnosis not present

## 2023-11-01 DIAGNOSIS — R0609 Other forms of dyspnea: Secondary | ICD-10-CM | POA: Diagnosis not present

## 2023-11-01 DIAGNOSIS — R42 Dizziness and giddiness: Secondary | ICD-10-CM | POA: Diagnosis not present

## 2023-11-01 DIAGNOSIS — I2510 Atherosclerotic heart disease of native coronary artery without angina pectoris: Secondary | ICD-10-CM | POA: Diagnosis not present

## 2023-11-05 ENCOUNTER — Ambulatory Visit (INDEPENDENT_AMBULATORY_CARE_PROVIDER_SITE_OTHER): Payer: BC Managed Care – PPO | Admitting: Podiatry

## 2023-11-05 ENCOUNTER — Encounter: Payer: Self-pay | Admitting: Podiatry

## 2023-11-05 VITALS — Ht 68.0 in | Wt 125.0 lb

## 2023-11-05 DIAGNOSIS — B351 Tinea unguium: Secondary | ICD-10-CM

## 2023-11-05 DIAGNOSIS — M79676 Pain in unspecified toe(s): Secondary | ICD-10-CM

## 2023-11-05 DIAGNOSIS — E1142 Type 2 diabetes mellitus with diabetic polyneuropathy: Secondary | ICD-10-CM

## 2023-11-05 DIAGNOSIS — M2010 Hallux valgus (acquired), unspecified foot: Secondary | ICD-10-CM | POA: Diagnosis not present

## 2023-11-05 DIAGNOSIS — Z0189 Encounter for other specified special examinations: Secondary | ICD-10-CM

## 2023-11-05 DIAGNOSIS — E119 Type 2 diabetes mellitus without complications: Secondary | ICD-10-CM | POA: Diagnosis not present

## 2023-11-11 ENCOUNTER — Encounter: Payer: Self-pay | Admitting: Podiatry

## 2023-11-11 NOTE — Progress Notes (Signed)
 ANNUAL DIABETIC FOOT EXAM  Subjective: Austin Hunt presents today for annual diabetic foot exam.  Chief Complaint  Patient presents with   Nail Problem    Patient is here for [facility]   Patient confirms h/o diabetes.  Patient denies any h/o foot wounds.  Patient has been diagnosed with neuropathy.  Sowles, Krichna, MD is patient's PCP. LOV 08/10/2023.  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    Diabetes mellitus without complication (HCC)    Glaucoma    Headache    rare - 1x/mo   Nocturia    Prostate cancer [facility])    Patient Active Problem List   Diagnosis Date Noted   B12 deficiency 04/20/2022   Type 2 diabetes, controlled, with peripheral neuropathy (HCC) 04/20/2022   Statin myopathy 04/20/2022   History of hypertension 04/20/2022   Vitamin D  deficiency 04/20/2022   Mild protein-calorie malnutrition (HCC) 04/20/2022   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) 06/21/2020   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) 06/21/2020   Cervical pain (neck) 10/13/2016   Other neutropenia (HCC) 01/07/2016   Carotid artery narrowing 04/08/2015   Chronic constipation 04/08/2015   H/O inguinal hernia repair 04/08/2015   Hyperlipidemia associated with type 2 diabetes mellitus (HCC) 04/08/2015   Low back pain 04/08/2015   MI (mitral incompetence) 04/08/2015   TI (tricuspid incompetence) 04/08/2015   Unilateral recurrent femoral hernia without obstruction or gangrene 05/23/2014   Right inguinal hernia 02/06/2014   Cataract 01/16/2014   Glaucoma 01/16/2014   Reflux esophagitis 06/21/2009   Atherosclerosis of native coronary artery of native heart without angina pectoris 04/03/2009   Benign enlargement of prostate 02/11/2009   Decreased libido 01/12/2008   Past Surgical History:  Procedure Laterality Date    APPENDECTOMY  1954   BLADDER SURGERY  Oct 2015   CARDIAC CATHETERIZATION  2002   1 stent   COLONOSCOPY     EYE SURGERY  2011, 2013   cataracts   EYE SURGERY Right May 2016   glaucoma   HERNIA REPAIR Left 1967   HERNIA REPAIR Right 05-10-14   Right femoral hernia repair Dr Dessa with PerFix plug, no onlay mesh.SABRA    PHOTOCOAGULATION WITH LASER Right 12/30/2015   Procedure: PHOTOCOAGULATION WITH LASER;  Surgeon: Donzell Arlyce Budd, MD;  Location: [facility];  Service: Ophthalmology;  Laterality: Right;  DIABETIC - oral meds IVA BLOCK   PROSTATE SURGERY  Oct 2015   Current Outpatient Medications on File Prior to Visit  Medication Sig Dispense Refill   Accu-Chek Softclix Lancets lancets TEST 4 TIMES DAILY 100 each 5   aspirin 81 MG tablet Take 81 mg by mouth daily.      Blood Glucose Monitoring Suppl (ACCU-CHEK GUIDE ME) w/Device KIT      brimonidine-timolol (COMBIGAN) 0.2-0.5 % ophthalmic solution Place 1 drop into both eyes every 12 (twelve) hours.     Cholecalciferol (VITAMIN D ) 50 MCG (2000 UT) CAPS Take 1 capsule (2,000 Units total) by mouth daily. 30 capsule 0   clotrimazole -betamethasone  (LOTRISONE ) cream Apply 1 application. topically at bedtime. 30 g 0   Cyanocobalamin  (B-12) 1000 MCG SUBL Place 1 tablet under the tongue daily. 30 tablet 0   docusate sodium (COLACE) 100 MG capsule Take 100 mg by mouth daily.     dorzolamide (  TRUSOPT) 2 % ophthalmic solution 1 drop 2 (two) times daily.      ezetimibe  (ZETIA ) 10 MG tablet TAKE 1 TABLET BY MOUTH ONCE DAILY 90 tablet 1   Garlic 10 MG CAPS Take 15 mg by mouth daily.      glucose blood (ACCU-CHEK GUIDE) test strip USE 4 TIMES A DAY AS DIRECTED 100 each 10   icosapent  Ethyl (VASCEPA ) 1 g capsule Take 2 capsules (2 g total) by mouth 2 (two) times daily. 480 capsule 1   ketoconazole  (NIZORAL ) 2 % cream Apply to both feet and between toes once daily for 6 weeks. 60 g 1   latanoprost  (XALATAN ) 0.005 % ophthalmic solution Place  1 drop into both eyes at bedtime.      Magnesium 100 MG TABS Take by mouth.      metFORMIN  (GLUCOPHAGE -XR) 500 MG 24 hr tablet Take 1 tablet (500 mg total) by mouth daily with breakfast. 90 tablet 1   Multiple Vitamin (MULTIVITAMIN) tablet Take 1 tablet by mouth daily.     pioglitazone  (ACTOS ) 15 MG tablet Take 1 tablet (15 mg total) by mouth daily. 90 tablet 1   polyethylene glycol powder (GLYCOLAX /MIRALAX ) 17 GM/SCOOP powder Take 17 g by mouth daily. 3350 g 1   sildenafil  (REVATIO ) 20 MG tablet Take 1-5 tablets one hour prior to intercourse as needed. 30 tablet 6   tamsulosin  (FLOMAX ) 0.4 MG CAPS capsule Take 1 capsule (0.4 mg total) by mouth daily. 90 capsule 3   triamcinolone (KENALOG) 0.025 % cream Apply topically.     vitamin C (ASCORBIC ACID) 500 MG tablet Take 500 mg by mouth 2 (two) times daily. Chewable     Zinc Acetate 50 MG CAPS Take 1 capsule by mouth daily.      No current facility-administered medications on file prior to visit.    Allergies  Allergen Reactions   Colesevelam Other (See Comments)   Penicillins Itching and Other (See Comments)   Statins Other (See Comments)    myalgia   Welchol [Colesevelam Hcl]    Social History   Occupational History    Employer: RETIRED  Tobacco Use   Smoking status: Never   Smokeless tobacco: Never   Tobacco comments:    smoking cessation materials not required  Vaping Use   Vaping status: Never Used  Substance and Sexual Activity   Alcohol use: No   Drug use: No   Sexual activity: Yes   Family History  Problem Relation Age of Onset   Diabetes Sister    Diabetes Brother    Diabetes Sister    Prostate cancer Neg Hx    Bladder Cancer Neg Hx    Immunization History  Administered Date(s) Administered   Fluad Quad(high Dose 65+) 08/01/2019, 08/19/2020, 07/22/2021, 08/20/2022   Fluad Trivalent(High Dose 65+) 08/10/2023   Influenza Split 07/02/2010   Influenza, High Dose Seasonal PF 08/14/2015, 07/30/2016, 08/19/2017,  08/09/2018   Influenza, Seasonal, Injecte, Preservative Fre 07/08/2011, 07/22/2012   Influenza,inj,Quad PF,6+ Mos 07/11/2013   PFIZER(Purple Top)SARS-COV-2 Vaccination 11/22/2019, 12/16/2019, 08/04/2020, 01/30/2021, 07/14/2021   PNEUMOCOCCAL CONJUGATE-20 04/20/2022   Pneumococcal Conjugate-13 01/22/2014   Pneumococcal-Unspecified 01/03/2008   Tdap 11/04/2010, 11/05/2020   Zoster Recombinant(Shingrix) 06/25/2020, 10/03/2020   Zoster, Live 06/14/2008     Review of Systems: Negative except as noted in the HPI.   Objective: There were no vitals filed for this visit.  Austin Hunt is a pleasant 83 y.o. male in NAD. AAO X 3.  Diabetic foot exam was performed  with the following findings:   CFT <3 seconds b/l LE. Faintly palpable pedal pulses b/l LE. Pedal hair present b/l LE. Skin temperature gradient WNL b/l. No pain with calf compression b/l. No edema b/l LE. No cyanosis or clubbing noted b/l LE.  Patient has subjective symptoms of neuropathy. Protective sensation intact 5/5 intact bilaterally with 10g monofilament b/l. Vibratory sensation intact b/l. Proprioception intact bilaterally.  Pedal skin is warm and supple b/l LE. No open wounds b/l LE. No interdigital macerations noted b/l LE. Toenail(s) 1-5 bilaterally elongated, discolored, dystrophic, thickened >1/4 inch. Nails are crumbly with subungual debris and there is exquisite tenderness to dorsal palpation. No subungual wound(s) noted. No corns, calluses nor porokeratotic lesions noted.  Muscle strength 5/5 to all lower extremity muscle groups bilaterally. No pain, crepitus or joint limitation noted with ROM bilateral LE. HAV with bunion deformity noted b/l LE.    Lab Results  Component Value Date   HGBA1C 6.7 (A) 08/10/2023   ADA Risk Categorization: Low Risk :  Patient has all of the following: Intact protective sensation No prior foot ulcer  No severe deformity Pedal pulses present  Assessment: 1. Pain due to  onychomycosis of toenail   2. Acquired hallux valgus, unspecified laterality   3. Type 2 diabetes, controlled, with peripheral neuropathy (HCC)   4. Encounter for diabetic foot exam [facility])     Plan: -Patient was evaluated today. All questions/concerns addressed on today's visit. -Diabetic foot examination performed today. -Continue diabetic foot care principles: inspect feet daily, monitor glucose as recommended by PCP and/or Endocrinologist, and follow prescribed diet per PCP, Endocrinologist and/or dietician. -Patient to continue soft, supportive shoe gear daily. -Toenails 1-5 b/l were debrided in length and girth with sterile nail nippers and dremel without iatrogenic bleeding.  -Patient/POA to call should there be question/concern in the interim. Return in about 3 months (around 02/03/2024).  Austin Hunt, DPM      [facility] LOCATION: [address], [place] [address]                   Office [phone]   [facility] LOCATION: [address] [place], [place] [address] Office [phone]

## 2023-11-17 ENCOUNTER — Other Ambulatory Visit: Payer: Self-pay | Admitting: Family Medicine

## 2023-11-17 DIAGNOSIS — E1136 Type 2 diabetes mellitus with diabetic cataract: Secondary | ICD-10-CM

## 2023-11-17 DIAGNOSIS — E1142 Type 2 diabetes mellitus with diabetic polyneuropathy: Secondary | ICD-10-CM

## 2023-12-09 ENCOUNTER — Telehealth: Payer: Self-pay | Admitting: Urology

## 2023-12-09 NOTE — Telephone Encounter (Signed)
 Spoke with [name] pharmacist and found out that RX for Sildenafil  from 09/2023 is on file with them and patient did not get this filled then but can if he wants to proceed. I called patient and advised him that medication has refills and on file with pharmacy and to let them know when he is ready to fill it.

## 2023-12-09 NOTE — Telephone Encounter (Signed)
 PT Just came in asking if they could be prescribed Sildenafil  and asked if someone could give him a call on getting that prescription. He stated his pharmacy is [name] in [place]

## 2023-12-28 ENCOUNTER — Other Ambulatory Visit: Payer: Self-pay | Admitting: Family Medicine

## 2023-12-29 ENCOUNTER — Other Ambulatory Visit: Payer: Self-pay | Admitting: Family Medicine

## 2023-12-29 DIAGNOSIS — E785 Hyperlipidemia, unspecified: Secondary | ICD-10-CM

## 2023-12-30 NOTE — Telephone Encounter (Signed)
 Last f/u 08/2023

## 2024-01-11 ENCOUNTER — Ambulatory Visit: Payer: Medicare Other | Admitting: Family Medicine

## 2024-01-11 ENCOUNTER — Ambulatory Visit: Payer: Medicare PPO | Admitting: Family Medicine

## 2024-01-13 ENCOUNTER — Ambulatory Visit: Payer: Self-pay | Admitting: Family Medicine

## 2024-01-13 NOTE — Telephone Encounter (Signed)
  Chief Complaint: pain Symptoms: left leg pain Frequency: off and on about 2 months Pertinent Negatives: Patient denies swelling, redness, fever, numbness Disposition: [] ED /[] Urgent Care (no appt availability in office) / [x] Appointment(In office/virtual)/ []  [facility]/ [] Home Care/ [] Refused Recommended Disposition /[]  Mobile Bus/ []  Follow-up with PCP Additional Notes: Patient states that this leg pain has been off and on for a couple months, doesn't bother him during the day but wakes him out of his sleep at night. The pain in his left leg at night is about 5/10. He states that he heard that sleeping with soap in your sock would help and he tried that before and it helped but hasn't done that in a while.    Copied from CRM [id number]. Topic: Clinical - Red Word Triage >> Jan 13, 2024 11:56 AM Tiffany B wrote: Red Word that prompted transfer to Nurse Triage: Patient states he's experiencing left leg pain that is keeping him up at night. Patient states his blood sugar level is 168 before he ate food. Reason for Disposition  [1] MILD pain (e.g., does not interfere with normal activities) AND [2] present > 7 days  Answer Assessment - Initial Assessment Questions 1. ONSET: "When did the pain start?"      Couple months 2. LOCATION: "Where is the pain located?"      Left pain 3. PAIN: "How bad is the pain?"    (Scale 1-10; or mild, moderate, severe)   -  MILD (1-3): doesn't interfere with normal activities    -  MODERATE (4-7): interferes with normal activities (e.g., work or school) or awakens from sleep, limping    -  SEVERE (8-10): excruciating pain, unable to do any normal activities, unable to walk     No pain til night, but 5/10 at night 4. WORK OR EXERCISE: "Has there been any recent work or exercise that involved this part of the body?"      no 5. CAUSE: "What do you think is causing the leg pain?"     unknown 6. OTHER SYMPTOMS: "Do you have any other  symptoms?" (e.g., chest pain, back pain, breathing difficulty, swelling, rash, fever, numbness, weakness)     none  Protocols used: Leg Pain-A-AH

## 2024-01-14 ENCOUNTER — Ambulatory Visit (INDEPENDENT_AMBULATORY_CARE_PROVIDER_SITE_OTHER): Admitting: Family Medicine

## 2024-01-14 ENCOUNTER — Encounter: Payer: Self-pay | Admitting: Family Medicine

## 2024-01-14 VITALS — BP 124/72 | HR 68 | Resp 16 | Ht 68.0 in | Wt 121.9 lb

## 2024-01-14 DIAGNOSIS — E559 Vitamin D deficiency, unspecified: Secondary | ICD-10-CM | POA: Diagnosis not present

## 2024-01-14 DIAGNOSIS — E785 Hyperlipidemia, unspecified: Secondary | ICD-10-CM

## 2024-01-14 DIAGNOSIS — R1312 Dysphagia, oropharyngeal phase: Secondary | ICD-10-CM | POA: Diagnosis not present

## 2024-01-14 DIAGNOSIS — R490 Dysphonia: Secondary | ICD-10-CM

## 2024-01-14 DIAGNOSIS — Z79899 Other long term (current) drug therapy: Secondary | ICD-10-CM

## 2024-01-14 DIAGNOSIS — E1169 Type 2 diabetes mellitus with other specified complication: Secondary | ICD-10-CM

## 2024-01-14 DIAGNOSIS — D708 Other neutropenia: Secondary | ICD-10-CM | POA: Diagnosis not present

## 2024-01-14 DIAGNOSIS — E538 Deficiency of other specified B group vitamins: Secondary | ICD-10-CM

## 2024-01-14 DIAGNOSIS — G4762 Sleep related leg cramps: Secondary | ICD-10-CM | POA: Diagnosis not present

## 2024-01-14 MED ORDER — ROPINIROLE HCL 0.5 MG PO TABS: 0.5000 mg | ORAL_TABLET | Freq: Every day | ORAL | 0 refills | Status: DC

## 2024-01-14 NOTE — Progress Notes (Signed)
 Name: Austin Hunt   MRN: [id number]    DOB: Dec 06, 1940   Date:01/14/2024       Progress Note  Subjective  Chief Complaint  Chief Complaint  Patient presents with   Leg Pain    On going for months more consistent, L leg only cramps at night    Discussed the use of AI scribe software for clinical note transcription with the patient, who gave verbal consent to proceed.  History of Present Illness   Austin Hunt is an 83 year old male who presents with leg cramps and weight loss.  He experiences cramps in his calves, primarily at night, which sometimes wake him up around 3 AM and prevent him from returning to sleep. The cramps subside once he gets up and moves around. He also describes a sensation of weakness and tiredness in his quadriceps during the day, usually when he first stands up,  although he does not experience cramps in this area. He maintains an active lifestyle, regularly exercising and going to the gym.  He has noticed a gradual weight loss over the past year, with his current weight at 115 pounds at home , down from 125 pounds. He is aware of his low muscle mass, particularly in his quadriceps. He lives alone following the death of his wife and sometimes cooks for himself or eats out. His diet includes vegetables, chicken, and fish, with occasional consumption of macaroni and cheese. He also uses glucerna  protein shakes.  He reports experiencing hoarseness and difficulty swallowing, describing a sensation of food getting stuck in his throat without associated pain. This issue has been ongoing, but he has not previously informed his ear, nose, and throat specialist about it. He visits this specialist annually but does not currently have an appointment scheduled.        Wt Readings from Last 5 Encounters:  01/14/24 121 lb 14.4 oz (55.3 kg)  11/05/23 125 lb (56.7 kg)  08/27/23 125 lb (56.7 kg)  08/26/23 125 lb (56.7 kg)  08/10/23 125 lb (56.7 kg)   BMI Readings from Last  5 Encounters:  01/14/24 18.53 kg/m  11/05/23 19.01 kg/m  08/27/23 19.01 kg/m  08/26/23 19.01 kg/m  08/10/23 19.01 kg/m    Patient Active Problem List   Diagnosis Date Noted   B12 deficiency 04/20/2022   Type 2 diabetes, controlled, with peripheral neuropathy (HCC) 04/20/2022   Statin myopathy 04/20/2022   History of hypertension 04/20/2022   Vitamin D deficiency 04/20/2022   Mild protein-calorie malnutrition (HCC) 04/20/2022   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) 06/21/2020   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) 06/21/2020   Cervical pain (neck) 10/13/2016   Other neutropenia (HCC) 01/07/2016   Carotid artery narrowing 04/08/2015   Chronic constipation 04/08/2015   H/O inguinal hernia repair 04/08/2015   Hyperlipidemia associated with type 2 diabetes mellitus (HCC) 04/08/2015   Low back pain 04/08/2015   MI (mitral incompetence) 04/08/2015   TI (tricuspid incompetence) 04/08/2015   Unilateral recurrent femoral hernia without obstruction or gangrene 05/23/2014   Right inguinal hernia 02/06/2014   Cataract 01/16/2014   Glaucoma 01/16/2014   Reflux esophagitis 06/21/2009   Atherosclerosis of native coronary artery of native heart without angina pectoris 04/03/2009   Benign enlargement of prostate 02/11/2009   Decreased libido 01/12/2008    Social History   Tobacco Use   Smoking status: Never   Smokeless tobacco: Never   Tobacco comments:  smoking cessation materials not required  Substance Use Topics   Alcohol use: No     Current Outpatient Medications:    Accu-Chek Softclix Lancets lancets, TEST 4 TIMES DAILY, Disp: 100 each, Rfl: 5   aspirin 81 MG tablet, Take 81 mg by mouth daily. , Disp: , Rfl:    Blood Glucose Monitoring Suppl (ACCU-CHEK GUIDE ME) w/Device KIT, , Disp: , Rfl:    brimonidine-timolol (COMBIGAN) 0.2-0.5 % ophthalmic solution, Place 1 drop into both eyes every 12 (twelve) hours., Disp: , Rfl:     Cholecalciferol (VITAMIN D) 50 MCG (2000 UT) CAPS, Take 1 capsule (2,000 Units total) by mouth daily., Disp: 30 capsule, Rfl: 0   clotrimazole-betamethasone (LOTRISONE) cream, Apply 1 application. topically at bedtime., Disp: 30 g, Rfl: 0   Cyanocobalamin (B-12) 1000 MCG SUBL, Place 1 tablet under the tongue daily., Disp: 30 tablet, Rfl: 0   docusate sodium (COLACE) 100 MG capsule, Take 100 mg by mouth daily., Disp: , Rfl:    dorzolamide (TRUSOPT) 2 % ophthalmic solution, 1 drop 2 (two) times daily. , Disp: , Rfl:    ezetimibe (ZETIA) 10 MG tablet, TAKE 1 TABLET BY MOUTH ONCE DAILY, Disp: 90 tablet, Rfl: 1   Garlic 10 MG CAPS, Take 15 mg by mouth daily. , Disp: , Rfl:    glucose blood (ACCU-CHEK GUIDE) test strip, USE 4 TIMES A DAY AS DIRECTED, Disp: 100 each, Rfl: 10   icosapent Ethyl (VASCEPA) 1 g capsule, Take 2 capsules (2 g total) by mouth 2 (two) times daily., Disp: 480 capsule, Rfl: 1   ketoconazole (NIZORAL) 2 % cream, Apply to both feet and between toes once daily for 6 weeks., Disp: 60 g, Rfl: 1   latanoprost (XALATAN) 0.005 % ophthalmic solution, Place 1 drop into both eyes at bedtime. , Disp: , Rfl:    Magnesium 100 MG TABS, Take by mouth. , Disp: , Rfl:    metFORMIN (GLUCOPHAGE-XR) 500 MG 24 hr tablet, Take 1 tablet (500 mg total) by mouth daily with breakfast., Disp: 90 tablet, Rfl: 1   Multiple Vitamin (MULTIVITAMIN) tablet, Take 1 tablet by mouth daily., Disp: , Rfl:    pioglitazone (ACTOS) 15 MG tablet, Take 1 tablet (15 mg total) by mouth daily., Disp: 90 tablet, Rfl: 1   polyethylene glycol powder (GLYCOLAX/MIRALAX) 17 GM/SCOOP powder, Take 17 g by mouth daily., Disp: 3350 g, Rfl: 1   sildenafil (REVATIO) 20 MG tablet, Take 1-5 tablets one hour prior to intercourse as needed., Disp: 30 tablet, Rfl: 6   tamsulosin (FLOMAX) 0.4 MG CAPS capsule, Take 1 capsule (0.4 mg total) by mouth daily., Disp: 90 capsule, Rfl: 3   triamcinolone (KENALOG) 0.025 % cream, Apply topically., Disp: ,  Rfl:    vitamin C (ASCORBIC ACID) 500 MG tablet, Take 500 mg by mouth 2 (two) times daily. Chewable, Disp: , Rfl:    Zinc Acetate 50 MG CAPS, Take 1 capsule by mouth daily. , Disp: , Rfl:   Allergies  Allergen Reactions   Colesevelam Other (See Comments)   Penicillins Itching and Other (See Comments)   Statins Other (See Comments)    myalgia   Welchol [Colesevelam Hcl]     ROS  Ten systems reviewed and is negative except as mentioned in HPI    Objective  Vitals:   01/14/24 0924  BP: 124/72  Pulse: 68  Resp: 16  Weight: 121 lb 14.4 oz (55.3 kg)  Height: 5\' 8"  (1.727 m)    Body mass index  is 18.53 kg/m.    Physical Exam  Constitutional: Patient appears well-developed and well-nourished. Obese  No distress.  HEENT: head atraumatic, normocephalic, pupils equal and reactive to light, neck supple Cardiovascular: Normal rate, regular rhythm and normal heart sounds.  No murmur heard. No BLE edema. Pulmonary/Chest: Effort normal and breath sounds normal. No respiratory distress. Abdominal: Soft.  There is no tenderness. Psychiatric: Patient has a normal mood and affect. behavior is normal. Judgment and thought content normal.  Muscular skeletal: slow gait but , no calf or quad pain during exam   Assessment and Plan    Oropharyngeal dysphagia and hoarseness Persistent dysphagia and hoarseness. ENT evaluation preferred due to anesthesia risks. - Refer to ENT specialist, Dr. Jenne Campus, for evaluation. - Consider barium swallow if ENT evaluation is inconclusive.  Leg cramps Intermittent nocturnal cramps with possible restless leg syndrome. Daytime weakness likely from muscle mass loss. Requip proposed with consent. Blood tests to rule out deficiencies. - Prescribe Requip 0.5 mg tablet at night. - Order blood tests: ferritin, magnesium, kidney function, anemia screening.  Malnutrition Significant weight loss and muscle mass loss. Suboptimal protein intake contributing to  weakness. - Encourage increased protein intake with meals. - Continue Lucerna protein shakes.  Diabetes Diabetes management deferred to future visit. Routine labs ordered for next appointment. - Order routine diabetes-related labs. - Bring diabetes readings to next appointment.

## 2024-01-15 LAB — COMPLETE METABOLIC PANEL WITH GFR
AG Ratio: 1.6 (calc) (ref 1.0–2.5)
ALT: 16 U/L (ref 9–46)
AST: 26 U/L (ref 10–35)
Albumin: 4.1 g/dL (ref 3.6–5.1)
Alkaline phosphatase (APISO): 60 U/L (ref 35–144)
BUN: 14 mg/dL (ref 7–25)
CO2: 33 mmol/L — ABNORMAL HIGH (ref 20–32)
Calcium: 9.6 mg/dL (ref 8.6–10.3)
Chloride: 98 mmol/L (ref 98–110)
Creat: 0.97 mg/dL (ref 0.70–1.22)
Globulin: 2.5 g/dL (ref 1.9–3.7)
Glucose, Bld: 153 mg/dL — ABNORMAL HIGH (ref 65–99)
Potassium: 4.7 mmol/L (ref 3.5–5.3)
Sodium: 137 mmol/L (ref 135–146)
Total Bilirubin: 0.6 mg/dL (ref 0.2–1.2)
Total Protein: 6.6 g/dL (ref 6.1–8.1)
eGFR: 77 mL/min/{1.73_m2} (ref >=60)

## 2024-01-15 LAB — LIPID PANEL
Cholesterol: 206 mg/dL — ABNORMAL HIGH (ref <200)
HDL: 68 mg/dL (ref >=40)
LDL Cholesterol (Calc): 123 mg/dL — ABNORMAL HIGH
Non-HDL Cholesterol (Calc): 138 mg/dL — ABNORMAL HIGH (ref <130)
Total CHOL/HDL Ratio: 3 (calc) (ref <5.0)
Triglycerides: 61 mg/dL (ref <150)

## 2024-01-15 LAB — CBC WITH DIFFERENTIAL/PLATELET
Absolute Lymphocytes: 638 {cells}/uL — ABNORMAL LOW (ref 850–3900)
Absolute Monocytes: 328 {cells}/uL (ref 200–950)
Basophils Absolute: 10 {cells}/uL (ref 0–200)
Basophils Relative: 0.4 %
Eosinophils Absolute: 90 {cells}/uL (ref 15–500)
Eosinophils Relative: 3.6 %
HCT: 43.7 % (ref 38.5–50.0)
Hemoglobin: 14.5 g/dL (ref 13.2–17.1)
MCH: 29.5 pg (ref 27.0–33.0)
MCHC: 33.2 g/dL (ref 32.0–36.0)
MCV: 89 fL (ref 80.0–100.0)
MPV: 9 fL (ref 7.5–12.5)
Monocytes Relative: 13.1 %
Neutro Abs: 1435 {cells}/uL — ABNORMAL LOW (ref 1500–7800)
Neutrophils Relative %: 57.4 %
Platelets: 214 10*3/uL (ref 140–400)
RBC: 4.91 10*6/uL (ref 4.20–5.80)
RDW: 11.8 % (ref 11.0–15.0)
Total Lymphocyte: 25.5 %
WBC: 2.5 10*3/uL — ABNORMAL LOW (ref 3.8–10.8)

## 2024-01-15 LAB — B12 AND FOLATE PANEL
Folate: >24 ng/mL
Vitamin B-12: 1617 pg/mL — ABNORMAL HIGH (ref 200–1100)

## 2024-01-15 LAB — HEMOGLOBIN A1C
Hgb A1c MFr Bld: 8.2 %{Hb} — ABNORMAL HIGH (ref <5.7)
Mean Plasma Glucose: 189 mg/dL
eAG (mmol/L): 10.4 mmol/L

## 2024-01-15 LAB — MICROALBUMIN / CREATININE URINE RATIO
Creatinine, Urine: 92 mg/dL (ref 20–320)
Microalb Creat Ratio: 7 mg/g{creat} (ref <30)
Microalb, Ur: 0.6 mg/dL

## 2024-01-15 LAB — FERRITIN: Ferritin: 36 ng/mL (ref 24–380)

## 2024-01-15 LAB — MAGNESIUM: Magnesium: 2 mg/dL (ref 1.5–2.5)

## 2024-01-15 LAB — VITAMIN D 25 HYDROXY (VIT D DEFICIENCY, FRACTURES): Vit D, 25-Hydroxy: 42 ng/mL (ref 30–100)

## 2024-02-04 ENCOUNTER — Ambulatory Visit (INDEPENDENT_AMBULATORY_CARE_PROVIDER_SITE_OTHER): Payer: Medicare Other | Admitting: Podiatry

## 2024-02-04 DIAGNOSIS — E1142 Type 2 diabetes mellitus with diabetic polyneuropathy: Secondary | ICD-10-CM

## 2024-02-04 DIAGNOSIS — M79676 Pain in unspecified toe(s): Secondary | ICD-10-CM

## 2024-02-04 DIAGNOSIS — B351 Tinea unguium: Secondary | ICD-10-CM | POA: Diagnosis not present

## 2024-02-04 NOTE — Progress Notes (Signed)
  Subjective:  Patient ID: Austin Hunt, male    DOB: 12-Mar-1941,  MRN: [id number]  Austin Hunt presents to clinic today for at risk foot care with history of diabetic neuropathy and painful, elongated thickened toenails x 10 which are symptomatic when wearing enclosed shoe gear. This interferes with his/her daily activities.   New problem(s): None.   PCP is Alba Cory, MD.  Allergies  Allergen Reactions   Colesevelam Other (See Comments)   Penicillins Itching and Other (See Comments)   Statins Other (See Comments)    myalgia   Welchol [Colesevelam Hcl]     Review of Systems: Negative except as noted in the HPI.  Objective: No changes noted in today's physical examination. There were no vitals filed for this visit. Austin Hunt is a pleasant 83 y.o. male in NAD. AAO x 3.  Vascular Examination: CFT <3 seconds b/l. DP/PT pulses faintly palpable b/l. Skin temperature gradient warm to warm b/l. No pain with calf compression. No ischemia or gangrene. No cyanosis or clubbing noted b/l.    Neurological Examination: Sensation grossly intact b/l with 10 gram monofilament. Vibratory sensation intact b/l. Pt has subjective symptoms of neuropathy.  Dermatological Examination: Pedal skin warm and supple b/l.   No open wounds. No interdigital macerations.  Toenails 1-5 b/l thick, discolored, elongated with subungual debris and pain on dorsal palpation.    No hyperkeratotic nor porokeratotic lesions present on today's visit.  Musculoskeletal Examination: Muscle strength 5/5 to all lower extremity muscle groups bilaterally. HAV with bunion deformity noted b/l LE.  Radiographs: None  Last A1c:      Latest Ref Rng & Units 01/14/2024   10:03 AM 08/10/2023   11:34 AM 03/24/2023   10:31 AM  Hemoglobin A1C  Hemoglobin-A1c <5.7 % of total Hgb 8.2  6.7  7.7     Assessment/Plan: 1. Pain due to onychomycosis of toenail   2. Type 2 diabetes, controlled, with peripheral neuropathy  [facility])    Consent given for treatment. Patient examined. All patient's and/or POA's questions/concerns addressed on today's visit. Mycotic toenails 1-5 debrided in length and girth without incident. Continue foot and shoe inspections daily. Monitor blood glucose per PCP/Endocrinologist's recommendations.Continue soft, supportive shoe gear daily. Report any pedal injuries to medical professional. Call office if there are any quesitons/concerns. -Patient/POA to call should there be question/concern in the interim.   Return in about 3 months (around 05/05/2024).  Freddie Breech, DPM      [facility] LOCATION: [address], [place] [address]                   Office [phone]   [facility] LOCATION: [address] [place], [place] [address] Office [phone]

## 2024-02-10 ENCOUNTER — Encounter: Payer: Self-pay | Admitting: Podiatry

## 2024-02-10 ENCOUNTER — Other Ambulatory Visit: Payer: Self-pay | Admitting: Family Medicine

## 2024-02-10 DIAGNOSIS — E1139 Type 2 diabetes mellitus with other diabetic ophthalmic complication: Secondary | ICD-10-CM

## 2024-02-10 DIAGNOSIS — E1142 Type 2 diabetes mellitus with diabetic polyneuropathy: Secondary | ICD-10-CM

## 2024-02-16 ENCOUNTER — Encounter: Payer: Self-pay | Admitting: Urology

## 2024-03-06 ENCOUNTER — Ambulatory Visit: Payer: Self-pay

## 2024-03-06 NOTE — Telephone Encounter (Signed)
  Chief Complaint: leg pain Symptoms: pain Frequency: intermittent Pertinent Negatives: Patient denies fever Disposition: [] ED /[] Urgent Care (no appt availability in office) / [x] Appointment(In office/virtual)/ []  [facility]/ [] Home Care/ [] Refused Recommended Disposition /[] [facility]/ []  Follow-up with PCP Additional Notes:  Several weeks ago evaluated by Dr. Ava Lei for nocturnal leg cramps, she gave him medication that helped. Calling today for bilateral leg pain 5/10, that comes on when he stands up. He is on daily aspirin, not taking any otc pain relievers for acute leg pain. Acute visit advised, patient inquired when his follow up visit is scheduled for, this writer confirmed he is scheduled on 03/16/24, patient states he does not want to reschedule follow up and does not want to schedule acute visit at this time, he prefers to wait until follow up, he states he thought follow up wasn't until end of May and that's why he called. He is leaving town today for several days to stay with his daughter due to family issue. He states he will call back if pain becomes constant or severe or with any new symptoms. Educated on care advice as documented in protocol, patient verbalized understanding. Discussed reasons to call back.    Copied from CRM [id number]. Topic: Clinical - Red Word Triage >> Mar 06, 2024  4:57 PM Leory Rands wrote: Red Word that prompted transfer to Nurse Triage: Patient is calling to report ocassional pain in the Left & Right leg only when he gets up. Reason for Disposition  Leg pain or muscle cramp is a chronic symptom (recurrent or ongoing AND present > 4 weeks)  Protocols used: Leg Pain-A-AH

## 2024-03-14 ENCOUNTER — Ambulatory Visit (INDEPENDENT_AMBULATORY_CARE_PROVIDER_SITE_OTHER): Admitting: Urology

## 2024-03-14 ENCOUNTER — Other Ambulatory Visit: Payer: Self-pay | Admitting: Family Medicine

## 2024-03-14 VITALS — BP 153/62 | HR 69 | Ht 68.0 in | Wt 125.0 lb

## 2024-03-14 DIAGNOSIS — N138 Other obstructive and reflux uropathy: Secondary | ICD-10-CM | POA: Diagnosis not present

## 2024-03-14 DIAGNOSIS — E1142 Type 2 diabetes mellitus with diabetic polyneuropathy: Secondary | ICD-10-CM

## 2024-03-14 DIAGNOSIS — E1136 Type 2 diabetes mellitus with diabetic cataract: Secondary | ICD-10-CM

## 2024-03-14 DIAGNOSIS — N401 Enlarged prostate with lower urinary tract symptoms: Secondary | ICD-10-CM

## 2024-03-14 DIAGNOSIS — R351 Nocturia: Secondary | ICD-10-CM

## 2024-03-14 LAB — URINALYSIS, COMPLETE
Bilirubin, UA: NEGATIVE
Ketones, UA: NEGATIVE
Leukocytes,UA: NEGATIVE
Nitrite, UA: NEGATIVE
Protein,UA: NEGATIVE
RBC, UA: NEGATIVE
Specific Gravity, UA: 1.02 (ref 1.005–1.030)
Urobilinogen, Ur: 0.2 mg/dL (ref 0.2–1.0)
pH, UA: 6 (ref 5.0–7.5)

## 2024-03-14 LAB — MICROSCOPIC EXAMINATION: Bacteria, UA: NONE SEEN

## 2024-03-14 LAB — BLADDER SCAN AMB NON-IMAGING: Scan Result: 22

## 2024-03-14 MED ORDER — TAMSULOSIN HCL 0.4 MG PO CAPS
0.4000 mg | ORAL_CAPSULE | Freq: Every day | ORAL | 3 refills | Status: AC
Start: 2024-03-14 — End: ?

## 2024-03-14 NOTE — Progress Notes (Signed)
 I,Amy L Pierron,acting as a scribe for Dustin Gimenez, MD.,have documented all relevant documentation on the behalf of Dustin Gimenez, MD,as directed by  Dustin Gimenez, MD while in the presence of Dustin Gimenez, MD.  03/14/2024 4:38 PM   Austin Hunt [birth year] [id number]  Referring provider: Arleen Lacer, MD [address] [place],  [place] [address]  Chief Complaint  Patient presents with   Follow-up    HPI: 83 year-old male with a personal history of benign prostatic hyperplasia (BPH) and incidental prostate cancer presents today for discussion of ongoing urinary symptoms.   He has a remote history of transurethral resection of the prostate (TURP) with findings of incidental Gleason 3+3 prostate cancer. His prostate-specific antigen (PSA) levels have remained stable over the years, with the last PSA being 2.04 nine months ago.   He was not taking any medications for BPH until late last year when he started experiencing worsening urinary symptoms.   He uses Sildenafil  as needed for chronic erectile dysfunction (ED).   His urinalysis today is negative, although there is one-plus glucose present.   He reports getting up at night to urinate one to two times and describes his urinary flow as not being right, though it does not hurt.   He had been restarted on Flomax , which he found helpful.  Results for orders placed or performed in visit on 03/14/24  Microscopic Examination   Urine  Result Value Ref Range   WBC, UA 0-5 0 - 5 /hpf   RBC, Urine 0-2 0 - 2 /hpf   Epithelial Cells (non renal) 0-10 0 - 10 /hpf   Bacteria, UA None seen None seen/Few  Urinalysis, Complete  Result Value Ref Range   Specific Gravity, UA 1.020 1.005 - 1.030   pH, UA 6.0 5.0 - 7.5   Color, UA Yellow Yellow   Appearance Ur Clear Clear   Leukocytes,UA Negative Negative   Protein,UA Negative Negative/Trace   Glucose, UA 1+ (A) Negative   Ketones, UA Negative Negative   RBC, UA  Negative Negative   Bilirubin, UA Negative Negative   Urobilinogen, Ur 0.2 0.2 - 1.0 mg/dL   Nitrite, UA Negative Negative   Microscopic Examination Comment    Microscopic Examination See below:   BLADDER SCAN AMB NON-IMAGING  Result Value Ref Range   Scan Result 22 ml     PMH: 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    Diabetes mellitus without complication (HCC)    Glaucoma    Headache    rare - 1x/mo   Nocturia    Prostate cancer [facility])     Surgical History: Past Surgical History:  Procedure Laterality Date   APPENDECTOMY  1954   BLADDER SURGERY  Oct 2015   CARDIAC CATHETERIZATION  2002   1 stent   COLONOSCOPY     EYE SURGERY  2011, 2013   cataracts   EYE SURGERY Right May 2016   glaucoma   HERNIA REPAIR Left 1967   HERNIA REPAIR Right 05-10-14   Right femoral hernia repair Dr Marquita Situ with PerFix plug, no onlay mesh.Aaron Aas    PHOTOCOAGULATION WITH LASER Right 12/30/2015   Procedure: PHOTOCOAGULATION WITH LASER;  Surgeon: Billee Buddle, MD;  Location: [facility];  Service: Ophthalmology;  Laterality: Right;  DIABETIC - oral meds IVA BLOCK  PROSTATE SURGERY  Oct 2015    Home Medications:  Allergies as of 03/14/2024       Reactions   Colesevelam Other (See Comments)   Penicillins Itching, Other (See Comments)   Statins Other (See Comments)   myalgia   Welchol [colesevelam Hcl]         Medication List        Accurate as of Mar 14, 2024  4:38 PM. If you have any questions, ask your nurse or doctor.          Accu-Chek Guide Me w/Device Kit   Accu-Chek Guide test strip Generic drug: glucose blood USE 4 TIMES A DAY AS DIRECTED   Accu-Chek Softclix Lancets lancets TEST 4 TIMES DAILY   ascorbic acid 500 MG tablet Commonly known as: VITAMIN C Take 500 mg by mouth 2 (two) times daily. Chewable    aspirin 81 MG tablet Take 81 mg by mouth daily.   B-12 1000 MCG Subl Place 1 tablet under the tongue daily.   brimonidine-timolol 0.2-0.5 % ophthalmic solution Commonly known as: COMBIGAN Place 1 drop into both eyes every 12 (twelve) hours.   clotrimazole -betamethasone  cream Commonly known as: Lotrisone  Apply 1 application. topically at bedtime.   docusate sodium 100 MG capsule Commonly known as: COLACE Take 100 mg by mouth daily.   dorzolamide 2 % ophthalmic solution Commonly known as: TRUSOPT 1 drop 2 (two) times daily.   ezetimibe  10 MG tablet Commonly known as: ZETIA  TAKE 1 TABLET BY MOUTH ONCE DAILY   Garlic 10 MG Caps Take 15 mg by mouth daily.   icosapent  Ethyl 1 g capsule Commonly known as: Vascepa  Take 2 capsules (2 g total) by mouth 2 (two) times daily.   ketoconazole  2 % cream Commonly known as: NIZORAL  Apply to both feet and between toes once daily for 6 weeks.   latanoprost  0.005 % ophthalmic solution Commonly known as: XALATAN  Place 1 drop into both eyes at bedtime.   Magnesium 100 MG Tabs Take by mouth.   metFORMIN  500 MG 24 hr tablet Commonly known as: GLUCOPHAGE -XR TAKE 1 TABLET BY MOUTH ONCE DAILY WITH BREAKFAST   multivitamin tablet Take 1 tablet by mouth daily.   pioglitazone  15 MG tablet Commonly known as: ACTOS  Take 1 tablet (15 mg total) by mouth daily.   polyethylene glycol powder 17 GM/SCOOP powder Commonly known as: GLYCOLAX /MIRALAX  Take 17 g by mouth daily.   rOPINIRole  0.5 MG tablet Commonly known as: REQUIP  Take 1 tablet (0.5 mg total) by mouth at bedtime.   sildenafil  20 MG tablet Commonly known as: REVATIO  Take 1-5 tablets one hour prior to intercourse as needed.   tamsulosin  0.4 MG Caps capsule Commonly known as: FLOMAX  Take 1 capsule (0.4 mg total) by mouth daily.   triamcinolone 0.025 % cream Commonly known as: KENALOG Apply topically.   Vitamin D  50 MCG (2000 UT) Caps Take 1 capsule (2,000 Units total) by  mouth daily.   Zinc Acetate 50 MG Caps Take 1 capsule by mouth daily.        Allergies:  Allergies  Allergen Reactions   Colesevelam Other (See Comments)   Penicillins Itching and Other (See Comments)   Statins Other (See Comments)    myalgia   Welchol [Colesevelam Hcl]     Family History: Family History  Problem Relation Age of Onset   Diabetes Sister    Diabetes Brother    Diabetes Sister    Prostate cancer Neg Hx    Bladder Cancer Neg  Hx     Social History:  reports that he has never smoked. He has never used smokeless tobacco. He reports that he does not drink alcohol and does not use drugs.   Physical Exam: BP (!) 153/62   Pulse 69   Ht 5\' 8"  (1.727 m)   Wt 125 lb (56.7 kg)   BMI 19.01 kg/m   Constitutional:  Alert and oriented, No acute distress. HEENT: [place] AT, moist mucus membranes.  Trachea midline, no masses. Neurologic: Grossly intact, no focal deficits, moving all 4 extremities. Psychiatric: Normal mood and affect.   Assessment & Plan:    1. BPH with LUTS (Lower Urinary Tract Symptoms) - Worsening urinary symptoms, including nocturia and altered urinary flow. Given the history of TURP approximately 10 years ago, it is possible that prostatic regrowth is contributing to symptoms. Flomax  (tamsulosin ) has been effective in managing symptoms, and he will continue this medication. A prescription for a year's supply of Flomax  will be provided. Surgical intervention is not deemed necessary at this time due to the effectiveness of Flomax .  2. History of incidental prostate cancer - PSA levels have remained stable, with the last reading at 2.4 nine months ago. He will continue to be monitored with regular PSA checks. He has a follow-up appointment with Cathleen Coach in July for PSA evaluation.  Return in about 2 months (around 05/14/2024).  I have reviewed the above documentation for accuracy and completeness, and I agree with the above.   Dustin Gimenez,  MD   [facility] [address] [place], [place] [address] [phone]

## 2024-03-16 ENCOUNTER — Telehealth: Payer: Self-pay | Admitting: Pharmacy Technician

## 2024-03-16 ENCOUNTER — Ambulatory Visit: Admitting: Family Medicine

## 2024-03-16 ENCOUNTER — Other Ambulatory Visit (HOSPITAL_COMMUNITY): Payer: Self-pay

## 2024-03-16 ENCOUNTER — Encounter: Payer: Self-pay | Admitting: Family Medicine

## 2024-03-16 VITALS — BP 126/70 | HR 70 | Resp 18 | Ht 68.0 in | Wt 126.4 lb

## 2024-03-16 DIAGNOSIS — T466X5A Adverse effect of antihyperlipidemic and antiarteriosclerotic drugs, initial encounter: Secondary | ICD-10-CM

## 2024-03-16 DIAGNOSIS — D708 Other neutropenia: Secondary | ICD-10-CM | POA: Diagnosis not present

## 2024-03-16 DIAGNOSIS — K5904 Chronic idiopathic constipation: Secondary | ICD-10-CM

## 2024-03-16 DIAGNOSIS — E1142 Type 2 diabetes mellitus with diabetic polyneuropathy: Secondary | ICD-10-CM

## 2024-03-16 DIAGNOSIS — E559 Vitamin D deficiency, unspecified: Secondary | ICD-10-CM

## 2024-03-16 DIAGNOSIS — G2581 Restless legs syndrome: Secondary | ICD-10-CM

## 2024-03-16 DIAGNOSIS — E538 Deficiency of other specified B group vitamins: Secondary | ICD-10-CM

## 2024-03-16 DIAGNOSIS — E1169 Type 2 diabetes mellitus with other specified complication: Secondary | ICD-10-CM

## 2024-03-16 DIAGNOSIS — E785 Hyperlipidemia, unspecified: Secondary | ICD-10-CM | POA: Diagnosis not present

## 2024-03-16 DIAGNOSIS — I2510 Atherosclerotic heart disease of native coronary artery without angina pectoris: Secondary | ICD-10-CM | POA: Diagnosis not present

## 2024-03-16 DIAGNOSIS — G720 Drug-induced myopathy: Secondary | ICD-10-CM

## 2024-03-16 DIAGNOSIS — Z7984 Long term (current) use of oral hypoglycemic drugs: Secondary | ICD-10-CM

## 2024-03-16 MED ORDER — POLYETHYLENE GLYCOL 3350 17 GM/SCOOP PO POWD: 17.0000 g | Freq: Every day | ORAL | 1 refills | Status: DC

## 2024-03-16 MED ORDER — ROPINIROLE HCL 0.5 MG PO TABS: 0.5000 mg | ORAL_TABLET | Freq: Every day | ORAL | 1 refills | Status: DC

## 2024-03-16 MED ORDER — NEXLIZET 180-10 MG PO TABS: 1.0000 | ORAL_TABLET | Freq: Every day | ORAL | 1 refills | Status: DC

## 2024-03-16 MED ORDER — ICOSAPENT ETHYL 1 G PO CAPS: 2.0000 g | ORAL_CAPSULE | Freq: Two times a day (BID) | ORAL | 1 refills | Status: DC

## 2024-03-16 MED ORDER — PIOGLITAZONE HCL 15 MG PO TABS: 15.0000 mg | ORAL_TABLET | Freq: Every day | ORAL | 1 refills | Status: DC

## 2024-03-16 MED ORDER — METFORMIN HCL ER 750 MG PO TB24
750.0000 mg | ORAL_TABLET | Freq: Every day | ORAL | 0 refills | Status: DC
Start: 2024-03-16 — End: 2024-05-23

## 2024-03-16 NOTE — Progress Notes (Signed)
 Name: Austin Hunt   MRN: [id number]    DOB: 04-May-1941   Date:03/16/2024       Progress Note  Subjective  Chief Complaint  Chief Complaint  Patient presents with   Medical Management of Chronic Issues   Discussed the use of AI scribe software for clinical note transcription with the patient, who gave verbal consent to proceed.  History of Present Illness Austin Hunt is an 83 year old male with diabetes, peripheral neuropathy, and hyperlipidemia who presents for a regular follow-up visit.  He has experienced improvement in leg cramps with the use of Requip , which he finds effective. He ran out of the medication but confirms that it worked well when he was taking it. No waking up at night needing to move his legs when on the medication.  He has diabetes with peripheral neuropathy, experiencing occasional discomfort in his feet, described as not feeling good, but denies burning pain. His blood sugar levels have increased since his metformin  dose was reduced from 750 mg to 500 mg due to previous weight loss. His morning blood sugar was 162 mg/dL. He is currently taking pioglitazone  15 mg and metformin  500 mg. No increased hunger, thirst, or urination despite elevated blood sugar levels.  He has hyperlipidemia and is currently taking Zetia  and Vascepa . His LDL cholesterol was 123 mg/dL. He cannot take statins due to muscle aches.  He has a history of glaucoma and cataracts and recently saw a specialist who confirmed everything was okay  . He also has a history of prostate cancer treated with TURP in 2015 and is monitored for BPH with tamsulosin  0.4 mg.  He experiences chronic constipation and sometimes goes several days without a bowel movement. He is not currently taking Miralax  regularly, which was previously prescribed to help regulate his bowel movements.  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 He was last evaluated for this in 2022.  He  takes B12 supplements daily, but his levels were previously high. He takes vitamin D  daily, which has improved his levels. No chest pain or shortness of breath with activity.    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) 06/21/2020   Cervical pain (neck) 10/13/2016   Other neutropenia (HCC) 01/07/2016   Carotid artery narrowing 04/08/2015   Chronic idiopathic constipation 04/08/2015   H/O inguinal hernia repair 04/08/2015   Hyperlipidemia associated with type 2 diabetes mellitus (HCC) 04/08/2015   Low back pain 04/08/2015   MI (mitral incompetence) 04/08/2015   TI (tricuspid incompetence) 04/08/2015   Unilateral recurrent femoral hernia without obstruction or gangrene 05/23/2014   Right inguinal hernia 02/06/2014   Cataract 01/16/2014   Glaucoma 01/16/2014   Reflux esophagitis 06/21/2009   Atherosclerosis of native coronary artery of native heart without angina pectoris 04/03/2009   Benign enlargement of prostate 02/11/2009   Decreased libido 01/12/2008    Past Surgical History:  Procedure Laterality Date   APPENDECTOMY  1954   BLADDER SURGERY  Oct 2015   CARDIAC CATHETERIZATION  2002   1 stent  COLONOSCOPY     EYE SURGERY  2011, 2013   cataracts   EYE SURGERY Right May 2016   glaucoma   HERNIA REPAIR Left 1967   HERNIA REPAIR Right 05-10-14   Right femoral hernia repair Dr Marquita Situ with PerFix plug, no onlay mesh.Aaron Aas    PHOTOCOAGULATION WITH LASER Right 12/30/2015   Procedure: PHOTOCOAGULATION WITH LASER;  Surgeon: Billee Buddle,  MD;  Location: [facility];  Service: Ophthalmology;  Laterality: Right;  DIABETIC - oral meds IVA BLOCK   PROSTATE SURGERY  Oct 2015    Family History  Problem Relation Age of Onset   Diabetes Sister    Diabetes Brother    Diabetes Sister    Prostate cancer Neg Hx    Bladder Cancer Neg Hx     Social History   Tobacco Use   Smoking status: Never   Smokeless tobacco: Never   Tobacco comments:    smoking cessation materials not required  Substance Use Topics   Alcohol use: No     Current Outpatient Medications:    Accu-Chek Softclix Lancets lancets, TEST 4 TIMES DAILY, Disp: 100 each, Rfl: 5   aspirin 81 MG tablet, Take 81 mg by mouth daily. , Disp: , Rfl:    Bempedoic Acid-Ezetimibe  (NEXLIZET) 180-10 MG TABS, Take 1 tablet by mouth daily with breakfast. In place of zetia , Disp: 90 tablet, Rfl: 1   Blood Glucose Monitoring Suppl (ACCU-CHEK GUIDE ME) w/Device KIT, , Disp: , Rfl:    brimonidine-timolol (COMBIGAN) 0.2-0.5 % ophthalmic solution, Place 1 drop into both eyes every 12 (twelve) hours., Disp: , Rfl:    Cholecalciferol (VITAMIN D ) 50 MCG (2000 UT) CAPS, Take 1 capsule (2,000 Units total) by mouth daily., Disp: 30 capsule, Rfl: 0   clotrimazole -betamethasone  (LOTRISONE ) cream, Apply 1 application. topically at bedtime., Disp: 30 g, Rfl: 0   Cyanocobalamin  (B-12) 1000 MCG SUBL, Place 1 tablet under the tongue daily., Disp: 30 tablet, Rfl: 0   docusate sodium (COLACE) 100 MG capsule, Take 100 mg by mouth daily., Disp: , Rfl:    dorzolamide (TRUSOPT) 2 % ophthalmic solution, 1 drop 2 (two) times daily. , Disp: , Rfl:    Garlic 10 MG CAPS, Take 15 mg by mouth daily. , Disp: , Rfl:    glucose blood (ACCU-CHEK GUIDE) test strip, USE 4 TIMES A DAY AS DIRECTED, Disp: 100 each, Rfl: 10   ketoconazole  (NIZORAL ) 2 % cream, Apply to both feet and between toes once daily for 6 weeks., Disp: 60 g, Rfl: 1   latanoprost  (XALATAN ) 0.005 % ophthalmic solution, Place 1 drop into both  eyes at bedtime. , Disp: , Rfl:    Magnesium 100 MG TABS, Take by mouth. , Disp: , Rfl:    metFORMIN  (GLUCOPHAGE -XR) 750 MG 24 hr tablet, Take 1 tablet (750 mg total) by mouth daily with breakfast., Disp: 90 tablet, Rfl: 0   Multiple Vitamin (MULTIVITAMIN) tablet, Take 1 tablet by mouth daily., Disp: , Rfl:    sildenafil  (REVATIO ) 20 MG tablet, Take 1-5 tablets one hour prior to intercourse as needed., Disp: 30 tablet, Rfl: 6   tamsulosin  (FLOMAX ) 0.4 MG CAPS capsule, Take 1 capsule (0.4 mg total) by mouth daily., Disp: 90 capsule, Rfl: 3   triamcinolone (KENALOG) 0.025 % cream, Apply topically., Disp: , Rfl:    vitamin C (ASCORBIC ACID) 500 MG tablet, Take 500 mg by mouth 2 (two) times daily. Chewable, Disp: , Rfl:    Zinc Acetate 50 MG CAPS, Take  1 capsule by mouth daily. , Disp: , Rfl:    icosapent  Ethyl (VASCEPA ) 1 g capsule, Take 2 capsules (2 g total) by mouth 2 (two) times daily., Disp: 480 capsule, Rfl: 1   pioglitazone  (ACTOS ) 15 MG tablet, Take 1 tablet (15 mg total) by mouth daily., Disp: 90 tablet, Rfl: 1   polyethylene glycol powder (GLYCOLAX /MIRALAX ) 17 GM/SCOOP powder, Take 17 g by mouth daily., Disp: 3350 g, Rfl: 1   rOPINIRole  (REQUIP ) 0.5 MG tablet, Take 1 tablet (0.5 mg total) by mouth at bedtime., Disp: 90 tablet, Rfl: 1  Allergies  Allergen Reactions   Colesevelam Other (See Comments)   Penicillins Itching and Other (See Comments)   Statins Other (See Comments)    myalgia   Welchol [Colesevelam Hcl]     I personally reviewed active problem list, medication list, allergies, family history with the patient/caregiver today.   ROS  Ten systems reviewed and is negative except as mentioned in HPI    Objective Physical Exam Constitutional: Patient appears well-developed and well-nourished.  No distress.  HEENT: head atraumatic, normocephalic, pupils equal and reactive to light,, neck supple Cardiovascular: Normal rate, regular rhythm and normal heart sounds.  No murmur  heard. No BLE edema. Pulmonary/Chest: Effort normal and breath sounds normal. No respiratory distress. Abdominal: Soft.  There is no tenderness. Psychiatric: Patient has a normal mood and affect. behavior is normal. Judgment and thought content normal.      Vitals:   03/16/24 0945  BP: 126/70  Pulse: 70  Resp: 18  SpO2: 100%  Weight: 126 lb 6.4 oz (57.3 kg)  Height: 5\' 8"  (1.727 m)    Body mass index is 19.22 kg/m.  Recent Results (from the past 2160 hours)  Lipid panel     Status: Abnormal   Collection Time: 01/14/24 10:03 AM  Result Value Ref Range   Cholesterol 206 (H) <200 mg/dL   HDL 68 > OR = 40 mg/dL   Triglycerides 61 <413 mg/dL   LDL Cholesterol (Calc) 123 (H) mg/dL (calc)    Comment: Reference range: <100 . Desirable range <100 mg/dL for primary prevention;   <70 mg/dL for patients with CHD or diabetic patients  with > or = 2 CHD risk factors. Aaron Aas LDL-C is now calculated using the Martin-Hopkins  calculation, which is a validated novel method providing  better accuracy than the Friedewald equation in the  estimation of LDL-C.  Melinda Sprawls et al. Erroll Heard. 2440;102(72): 2061-2068  (http://education.[facility].com/faq/FAQ164)    Total CHOL/HDL Ratio 3.0 <5.0 (calc)   Non-HDL Cholesterol (Calc) 138 (H) <130 mg/dL (calc)    Comment: For patients with diabetes plus 1 major ASCVD risk  factor, treating to a non-HDL-C goal of <100 mg/dL  (LDL-C of <53 mg/dL) is considered a therapeutic  option.   Microalbumin / creatinine urine ratio     Status: None   Collection Time: 01/14/24 10:03 AM  Result Value Ref Range   Creatinine, Urine 92 20 - 320 mg/dL   Microalb, Ur 0.6 mg/dL    Comment: Reference Range Not established    Microalb Creat Ratio 7 <30 mg/g creat    Comment: . The ADA defines abnormalities in albumin excretion as follows: Aaron Aas Albuminuria Category        Result (mg/g creatinine) . Normal to Mildly increased   <30 Moderately increased          30-299  Severely increased           > OR = 300 .  The ADA recommends that at least two of three specimens collected within a 3-6 month period be abnormal before considering a patient to be within a diagnostic category.   COMPLETE METABOLIC PANEL WITH GFR     Status: Abnormal   Collection Time: 01/14/24 10:03 AM  Result Value Ref Range   Glucose, Bld 153 (H) 65 - 99 mg/dL    Comment: .            Fasting reference interval . For someone without known diabetes, a glucose value >125 mg/dL indicates that they may have diabetes and this should be confirmed with a follow-up test. .    BUN 14 7 - 25 mg/dL   Creat 1.61 0.96 - 0.45 mg/dL   eGFR 77 > OR = 60 WU/JWJ/1.91Y7   BUN/Creatinine Ratio SEE NOTE: 6 - 22 (calc)    Comment:    Not Reported: BUN and Creatinine are within    reference range. .    Sodium 137 135 - 146 mmol/L   Potassium 4.7 3.5 - 5.3 mmol/L   Chloride 98 98 - 110 mmol/L   CO2 33 (H) 20 - 32 mmol/L   Calcium 9.6 8.6 - 10.3 mg/dL   Total Protein 6.6 6.1 - 8.1 g/dL   Albumin 4.1 3.6 - 5.1 g/dL   Globulin 2.5 1.9 - 3.7 g/dL (calc)   AG Ratio 1.6 1.0 - 2.5 (calc)   Total Bilirubin 0.6 0.2 - 1.2 mg/dL   Alkaline phosphatase (APISO) 60 35 - 144 U/L   AST 26 10 - 35 U/L   ALT 16 9 - 46 U/L  CBC with Differential/Platelet     Status: Abnormal   Collection Time: 01/14/24 10:03 AM  Result Value Ref Range   WBC 2.5 (L) 3.8 - 10.8 Thousand/uL   RBC 4.91 4.20 - 5.80 Million/uL   Hemoglobin 14.5 13.2 - 17.1 g/dL   HCT 82.9 56.2 - 13.0 %   MCV 89.0 80.0 - 100.0 fL   MCH 29.5 27.0 - 33.0 pg   MCHC 33.2 32.0 - 36.0 g/dL    Comment: For adults, a slight decrease in the calculated MCHC value (in the range of 30 to 32 g/dL) is most likely not clinically significant; however, it should be interpreted with caution in correlation with other red cell parameters and the patient's clinical condition.    RDW 11.8 11.0 - 15.0 %   Platelets 214 140 - 400 Thousand/uL   MPV 9.0  7.5 - 12.5 fL   Neutro Abs 1,435 (L) 1,500 - 7,800 cells/uL   Absolute Lymphocytes 638 (L) 850 - 3,900 cells/uL   Absolute Monocytes 328 200 - 950 cells/uL   Eosinophils Absolute 90 15 - 500 cells/uL   Basophils Absolute 10 0 - 200 cells/uL   Neutrophils Relative % 57.4 %   Total Lymphocyte 25.5 %   Monocytes Relative 13.1 %   Eosinophils Relative 3.6 %   Basophils Relative 0.4 %  Hemoglobin A1c     Status: Abnormal   Collection Time: 01/14/24 10:03 AM  Result Value Ref Range   Hgb A1c MFr Bld 8.2 (H) <5.7 % of total Hgb    Comment: For someone without known diabetes, a hemoglobin A1c value of 6.5% or greater indicates that they may have  diabetes and this should be confirmed with a follow-up  test. . For someone with known diabetes, a value <7% indicates  that their diabetes is well controlled and a value  greater than or equal  to 7% indicates suboptimal  control. A1c targets should be individualized based on  duration of diabetes, age, comorbid conditions, and  other considerations. . Currently, no consensus exists regarding use of hemoglobin A1c for diagnosis of diabetes for children. .    Mean Plasma Glucose 189 mg/dL   eAG (mmol/L) 16.1 mmol/L  VITAMIN D  25 Hydroxy (Vit-D Deficiency, Fractures)     Status: None   Collection Time: 01/14/24 10:03 AM  Result Value Ref Range   Vit D, 25-Hydroxy 42 30 - 100 ng/mL    Comment: Vitamin D  Status         25-OH Vitamin D : . Deficiency:                    <20 ng/mL Insufficiency:             20 - 29 ng/mL Optimal:                 > or = 30 ng/mL . For 25-OH Vitamin D  testing on patients on  D2-supplementation and patients for whom quantitation  of D2 and D3 fractions is required, the [facility](TM) 25-OH VIT D, (D2,D3), LC/MS/MS is recommended: order  code 09604 (patients >54yrs). . See Note 1 . Note 1 . For additional information, please refer to  http://education.[facility].com/faq/FAQ199  (This link is being  provided for informational/ educational purposes only.)   B12 and Folate Panel     Status: Abnormal   Collection Time: 01/14/24 10:03 AM  Result Value Ref Range   Vitamin B-12 1,617 (H) 200 - 1,100 pg/mL   Folate >24.0 ng/mL    Comment:                            Reference Range                            Low:           <3.4                            Borderline:    3.4-5.4                            Normal:        >5.4 .   Magnesium     Status: None   Collection Time: 01/14/24 10:03 AM  Result Value Ref Range   Magnesium 2.0 1.5 - 2.5 mg/dL  Ferritin     Status: None   Collection Time: 01/14/24 10:03 AM  Result Value Ref Range   Ferritin 36 24 - 380 ng/mL  Urinalysis, Complete     Status: Abnormal   Collection Time: 03/14/24  2:37 PM  Result Value Ref Range   Specific Gravity, UA 1.020 1.005 - 1.030   pH, UA 6.0 5.0 - 7.5   Color, UA Yellow Yellow   Appearance Ur Clear Clear   Leukocytes,UA Negative Negative   Protein,UA Negative Negative/Trace   Glucose, UA 1+ (A) Negative   Ketones, UA Negative Negative   RBC, UA Negative Negative   Bilirubin, UA Negative Negative   Urobilinogen, Ur 0.2 0.2 - 1.0 mg/dL   Nitrite, UA Negative Negative   Microscopic Examination Comment     Comment: Microscopic follows if indicated.   Microscopic Examination See below:  Comment: Microscopic was indicated and was performed.  Microscopic Examination     Status: None   Collection Time: 03/14/24  2:37 PM   Urine  Result Value Ref Range   WBC, UA 0-5 0 - 5 /hpf   RBC, Urine 0-2 0 - 2 /hpf   Epithelial Cells (non renal) 0-10 0 - 10 /hpf   Bacteria, UA None seen None seen/Few  BLADDER SCAN AMB NON-IMAGING     Status: None   Collection Time: 03/14/24  2:40 PM  Result Value Ref Range   Scan Result 22 ml     Diabetic Foot Exam:     PHQ2/9:    03/16/2024    9:37 AM 01/14/2024    9:23 AM 08/27/2023    9:55 AM 08/10/2023   11:33 AM 06/16/2023    9:54 AM  Depression screen PHQ 2/9   Decreased Interest 0 0 0 0 0  Down, Depressed, Hopeless 0 0 0 0 0  PHQ - 2 Score 0 0 0 0 0  Altered sleeping  0  0 0  Tired, decreased energy  0  0 0  Change in appetite  0  0 0  Feeling bad or failure about yourself   0  0 0  Trouble concentrating  0  0 0  Moving slowly or fidgety/restless  0  0 0  Suicidal thoughts  0  0 0  PHQ-9 Score  0  0 0  Difficult doing work/chores  Not difficult at all       phq 9 is negative  Fall Risk:    03/16/2024    9:37 AM 01/14/2024    9:23 AM 08/27/2023    9:50 AM 08/10/2023   11:33 AM 06/16/2023    9:54 AM  Fall Risk   Falls in the past year? 0 0 0 0 0  Number falls in past yr: 0 0 0 0 0  Injury with Fall? 0 0 0 0 0  Risk for fall due to : No Fall Risks No Fall Risks No Fall Risks No Fall Risks No Fall Risks  Follow up Falls prevention discussed;Education provided;Falls evaluation completed Falls prevention discussed;Education provided;Falls evaluation completed Education provided;Falls prevention discussed Falls prevention discussed Falls prevention discussed     Assessment & Plan Type 2 diabetes mellitus with peripheral neuropathy A1c increased to 8.2% after metformin  dose reduction. Elevated morning glucose. Peripheral neuropathy symptoms intermittent, not severe enough for medication. - Increase metformin  dose to 750 mg. - Continue pioglitazone  15 mg. - Schedule follow-up in two months.  Atherosclerosis of coronary arteries Atherosclerosis without chest pain or dyspnea. Emphasized cholesterol reduction due to vascular disease.  Hyperlipidemia LDL at 123 mg/dL. Statins not tolerated. On Zetia  and Vascepa . Considering Nexlizet if covered. - Check insurance coverage for Nexlizet. - Switch to Nexlizet if covered, otherwise continue Zetia . - Continue Vascepa .  Restless legs syndrome Symptoms improved with Requip , recurrence noted after running out of medication. - Prescribe Requip  with a 90-day supply and one month refill.  Chronic  constipation Intermittent bowel movements due to inconsistent Miralax  use. Emphasized regular use to prevent constipation. - Prescribe Miralax , take one capful daily. - Adjust Miralax  frequency based on bowel movement regularity.  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 Seen by hematologist in the past  Prostate cancer Treated with TURP in 2015. Slightly elevated PSA levels under monitoring. LUTS  managed with tamsulosin . - Continue monitoring PSA levels with specialist. - Continue tamsulosin  0.4 mg.  Chronic Idiopathic Constipation -  resume Miralax   Vitamin D  and Vitamin B12 deficiency -go down on B12 to every other week, last level high -continue vitamin D  supplementation

## 2024-03-16 NOTE — Telephone Encounter (Signed)
 Pharmacy Patient Advocate Encounter   Received notification from CoverMyMeds that prior authorization for Polyethylene Glycol 3350  17GM/SCOOP powder is required/requested.   Insurance verification completed.   The patient is insured through [employer] .   Per test claim: The medication will not be approved because it's a plan/benefit exclusion, not covered

## 2024-03-16 NOTE — Telephone Encounter (Signed)
 Pharmacy Patient Advocate Encounter  Received notification from [employer] that Prior Authorization for Nexlizet 180-10MG  tablets has been APPROVED from 11/03/23 to 11/01/24. Ran test claim, Copay is $128.00 for 3 months. This test claim was processed through [facility]- copay amounts may vary at other pharmacies due to pharmacy/plan contracts, or as the patient moves through the different stages of their insurance plan.   PA #/Case ID/Reference #: [id number]

## 2024-03-16 NOTE — Telephone Encounter (Signed)
 Pharmacy Patient Advocate Encounter   Received notification from CoverMyMeds that prior authorization for Nexlizet 180-10MG  tablets is required/requested.   Insurance verification completed.   The patient is insured through [place] .   Per test claim: PA required; PA submitted to above mentioned insurance via CoverMyMeds Key/confirmation #/EOC [id number] Status is pending

## 2024-03-20 ENCOUNTER — Other Ambulatory Visit: Payer: Self-pay | Admitting: Family Medicine

## 2024-03-20 ENCOUNTER — Telehealth: Payer: Self-pay

## 2024-03-20 DIAGNOSIS — E1169 Type 2 diabetes mellitus with other specified complication: Secondary | ICD-10-CM

## 2024-03-20 MED ORDER — EZETIMIBE 10 MG PO TABS: 10.0000 mg | ORAL_TABLET | Freq: Every day | ORAL | 3 refills | Status: AC

## 2024-03-20 NOTE — Telephone Encounter (Signed)
 Pt states not interested in NEXLIZET  due to high cost for him. Pt would like to stay on ZETIA . Please advice

## 2024-03-20 NOTE — Telephone Encounter (Signed)
 Copied from CRM [id number]. Topic: Clinical - Prescription Issue >> Mar 20, 2024  9:05 AM Everette C wrote: Reason for CRM: The patient has called back as directed by their PCP. The patient has been told that their potential prescription for cholesterol medication will be roughly $40 out of pocket every mont, the patient would like to discuss potential alternatives when possible  Please contact if/when available

## 2024-04-05 ENCOUNTER — Other Ambulatory Visit: Payer: Self-pay | Admitting: Family Medicine

## 2024-04-05 DIAGNOSIS — E1136 Type 2 diabetes mellitus with diabetic cataract: Secondary | ICD-10-CM

## 2024-04-05 DIAGNOSIS — E1140 Type 2 diabetes mellitus with diabetic neuropathy, unspecified: Secondary | ICD-10-CM

## 2024-05-12 ENCOUNTER — Encounter: Payer: Self-pay | Admitting: Podiatry

## 2024-05-12 ENCOUNTER — Ambulatory Visit (INDEPENDENT_AMBULATORY_CARE_PROVIDER_SITE_OTHER): Admitting: Podiatry

## 2024-05-12 VITALS — Ht 68.0 in | Wt 126.4 lb

## 2024-05-12 DIAGNOSIS — E0865 Diabetes mellitus due to underlying condition with hyperglycemia: Secondary | ICD-10-CM | POA: Diagnosis not present

## 2024-05-12 DIAGNOSIS — M79676 Pain in unspecified toe(s): Secondary | ICD-10-CM | POA: Diagnosis not present

## 2024-05-12 DIAGNOSIS — B351 Tinea unguium: Secondary | ICD-10-CM

## 2024-05-18 ENCOUNTER — Encounter: Payer: Self-pay | Admitting: Podiatry

## 2024-05-18 NOTE — Progress Notes (Signed)
  Subjective:  Patient ID: Austin Hunt, male    DOB: [birth year],  MRN: [id number]  83 y.o. male presents to clinic with  at risk foot care with history of diabetic neuropathy and painful thick toenails that are difficult to trim. Pain interferes with ambulation. Aggravating factors include wearing enclosed shoe gear. Pain is relieved with periodic professional debridement.  Chief Complaint  Patient presents with   Nail Problem    Pt is here for [facility] last A1C was 7.1 PCP is Dr Glenard and LOV was in May.   New problem(s): None   PCP is Sowles, Krichna, MD.  Allergies  Allergen Reactions   Colesevelam Other (See Comments)   Penicillins Itching and Other (See Comments)   Statins Other (See Comments)    myalgia   Welchol [Colesevelam Hcl]     Review of Systems: Negative except as noted in the HPI.   Objective:  TOU HAYNER is a pleasant 83 y.o. male thin build in NAD. AAO x 3.  Vascular Examination: Vascular status intact b/l with palpable pedal pulses. CFT immediate b/l. No edema. No pain with calf compression b/l. Skin temperature gradient WNL b/l. No ischemia or gangrene noted b/l LE. No cyanosis or clubbing noted b/l LE.  Neurological Examination: Pt has subjective symptoms of neuropathy.Sensation grossly intact b/l with 10 gram monofilament. Vibratory sensation intact b/l.   Dermatological Examination: Pedal skin with normal turgor, texture and tone b/l. Toenails 1-5 b/l thick, discolored, elongated with subungual debris and pain on dorsal palpation. No hyperkeratotic lesions noted b/l.   Musculoskeletal Examination: Muscle strength 5/5 to b/l LE. HAV with bunion deformity noted b/l LE. Patient ambulates independent of any assistive aids.  Radiographs: None  Last A1c:      Latest Ref Rng & Units 01/14/2024   10:03 AM 08/10/2023   11:34 AM  Hemoglobin A1C  Hemoglobin-A1c <5.7 % of total Hgb 8.2  6.7      Assessment:   1. Pain due to onychomycosis of toenail   2.  Diabetes mellitus due to underlying condition, uncontrolled, with hyperglycemia (HCC)    Plan:  Patient was evaluated and treated. All patient's and/or POA's questions/concerns addressed on today's visit. He is working with PCP on diabetes control. Toenails 1-5 debrided in length and girth without incident. Continue soft, supportive shoe gear daily. Report any pedal injuries to medical professional. Call office if there are any questions/concerns. -Patient/POA to call should there be question/concern in the interim.  Return in about 3 months (around 08/12/2024).  Delon LITTIE Merlin, DPM      [facility] LOCATION: [address], [place] [address]                   Office [phone]   [facility] LOCATION: [address] [place], [place] [address] Office [phone]

## 2024-05-21 NOTE — Progress Notes (Unsigned)
 03/14/2024 3:34 PM   Austin Hunt 1941/06/11 [id number]  Referring provider: Glenard Mire, MD [address] [place],  [place] [address]  Urological history: 1. Low risk prostate cancer - PSA pending  2. BPH w/ LU TS - TURP ~ 10 years ago - tamsulosin  0.4 mg daily   3. ED - sildenafil  20 mg, on-demand-dosing  4. Nephrolithiasis - CT (03/2023) punctate stones bilaterally   Chief Complaint  Patient presents with   Follow-up   Prostate Cancer   HPI: 83 year-old man with a personal history of benign prostatic hyperplasia (BPH) and incidental prostate cancer presents today for one year follow up.   Previous records reviewed.     I PSS 7/1  He is doing well with the tamsulosin  0.4 mg daily.  Patient denies any modifying or aggravating factors.  Patient denies any recent UTI's, gross hematuria, dysuria or suprapubic/flank pain.  Patient denies any fevers, chills, nausea or vomiting.      IPSS     Row Name 05/22/24 1400         International Prostate Symptom Score   How often have you had the sensation of not emptying your bladder? Not at All     How often have you had to urinate less than every two hours? Less than 1 in 5 times     How often have you found you stopped and started again several times when you urinated? Less than half the time     How often have you found it difficult to postpone urination? Not at All     How often have you had a weak urinary stream? Less than 1 in 5 times     How often have you had to strain to start urination? Less than half the time     How many times did you typically get up at night to urinate? 1 Time     Total IPSS Score 7       Quality of Life due to urinary symptoms   If you were to spend the rest of your life with your urinary condition just the way it is now how would you feel about that? Pleased         PMH: 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   Balanitis     BPH (benign prostatic hypertrophy) with urinary obstruction    Calculus in bladder    Diabetes mellitus without complication (HCC)    Glaucoma    Headache    rare - 1x/mo   Nocturia    Prostate cancer [facility])     Surgical History: Past Surgical History:  Procedure Laterality Date   APPENDECTOMY  1954   BLADDER SURGERY  Oct 2015   CARDIAC CATHETERIZATION  2002   1 stent   COLONOSCOPY     EYE SURGERY  2011, 2013   cataracts   EYE SURGERY Right May 2016   glaucoma   HERNIA REPAIR Left 1967   HERNIA REPAIR Right 05-10-14   Right femoral hernia repair Dr Dessa with PerFix plug, no onlay mesh.SABRA    PHOTOCOAGULATION WITH LASER Right 12/30/2015   Procedure: PHOTOCOAGULATION WITH LASER;  Surgeon: Donzell Arlyce Budd, MD;  Location: [facility];  Service: Ophthalmology;  Laterality: Right;  DIABETIC - oral meds IVA BLOCK   PROSTATE SURGERY  Oct 2015    Home Medications:  Allergies as of 05/22/2024  Reactions   Colesevelam Other (See Comments)   Penicillins Itching, Other (See Comments)   Statins Other (See Comments)   myalgia   Welchol [colesevelam Hcl]         Medication List        Accurate as of May 22, 2024  3:34 PM. If you have any questions, ask your nurse or doctor.          Accu-Chek Guide Me w/Device Kit   Accu-Chek Guide Test test strip Generic drug: glucose blood USE 4 TIMES A DAY AS DIRECTED   Accu-Chek Softclix Lancets lancets TEST 4 TIMES DAILY   ascorbic acid 500 MG tablet Commonly known as: VITAMIN C Take 500 mg by mouth 2 (two) times daily. Chewable   aspirin 81 MG tablet Take 81 mg by mouth daily.   B-12 1000 MCG Subl Place 1 tablet under the tongue daily.   brimonidine-timolol 0.2-0.5 % ophthalmic solution Commonly known as: COMBIGAN Place 1 drop into both eyes every 12 (twelve) hours.   clotrimazole -betamethasone  cream Commonly known as: Lotrisone  Apply 1 application. topically at bedtime.   docusate sodium 100  MG capsule Commonly known as: COLACE Take 100 mg by mouth daily.   dorzolamide 2 % ophthalmic solution Commonly known as: TRUSOPT 1 drop 2 (two) times daily.   ezetimibe  10 MG tablet Commonly known as: Zetia  Take 1 tablet (10 mg total) by mouth daily.   Garlic 10 MG Caps Take 15 mg by mouth daily.   icosapent  Ethyl 1 g capsule Commonly known as: Vascepa  Take 2 capsules (2 g total) by mouth 2 (two) times daily.   ketoconazole  2 % cream Commonly known as: NIZORAL  Apply to both feet and between toes once daily for 6 weeks.   latanoprost  0.005 % ophthalmic solution Commonly known as: XALATAN  Place 1 drop into both eyes at bedtime.   Magnesium 100 MG Tabs Take by mouth.   metFORMIN  750 MG 24 hr tablet Commonly known as: GLUCOPHAGE -XR Take 1 tablet (750 mg total) by mouth daily with breakfast.   multivitamin tablet Take 1 tablet by mouth daily.   pioglitazone  15 MG tablet Commonly known as: ACTOS  Take 1 tablet (15 mg total) by mouth daily.   polyethylene glycol powder 17 GM/SCOOP powder Commonly known as: GLYCOLAX /MIRALAX  Take 17 g by mouth daily.   rOPINIRole  0.5 MG tablet Commonly known as: REQUIP  Take 1 tablet (0.5 mg total) by mouth at bedtime.   sildenafil  20 MG tablet Commonly known as: REVATIO  Take 1-5 tablets one hour prior to intercourse as needed.   tamsulosin  0.4 MG Caps capsule Commonly known as: FLOMAX  Take 1 capsule (0.4 mg total) by mouth daily.   triamcinolone 0.025 % cream Commonly known as: KENALOG Apply topically.   Vitamin D  50 MCG (2000 UT) Caps Take 1 capsule (2,000 Units total) by mouth daily.   Zinc Acetate 50 MG Caps Take 1 capsule by mouth daily.        Allergies:  Allergies  Allergen Reactions   Colesevelam Other (See Comments)   Penicillins Itching and Other (See Comments)   Statins Other (See Comments)    myalgia   Welchol [Colesevelam Hcl]     Family History: Family History  Problem Relation Age of Onset    Diabetes Sister    Diabetes Brother    Diabetes Sister    Prostate cancer Neg Hx    Bladder Cancer Neg Hx     Social History:  reports that he has never smoked. He has  never used smokeless tobacco. He reports that he does not drink alcohol and does not use drugs.   Physical Exam: BP (!) 155/72 (BP Location: Left Arm, Patient Position: Sitting, Cuff Size: Normal)   Pulse 63   Ht 5' 8 (1.727 m)   Wt 124 lb 3.2 oz (56.3 kg)   SpO2 100%   BMI 18.88 kg/m   Constitutional:  Well nourished. Alert and oriented, No acute distress. HEENT: [place] AT, moist mucus membranes.  Trachea midline Cardiovascular: No clubbing, cyanosis, or edema. Respiratory: Normal respiratory effort, no increased work of breathing. Neurologic: Grossly intact, no focal deficits, moving all 4 extremities. Psychiatric: Normal mood and affect.   Assessment & Plan:    1. BPH with LUTS (Lower Urinary Tract Symptoms) - at goal on tamsulosin  0.4 mg daily - Continue tamsulosin   2. Low risk prostate cancer - PSA pending   Return in about 1 year (around 05/22/2025) for PSA and symptom recheck .  I have reviewed the above documentation for accuracy and completeness, and I agree with the above.   CLOTILDA CORNWALL, PA-C   [facility] [address], [address] [place], [place] [address] [phone]

## 2024-05-22 ENCOUNTER — Ambulatory Visit (INDEPENDENT_AMBULATORY_CARE_PROVIDER_SITE_OTHER): Admitting: Urology

## 2024-05-22 ENCOUNTER — Encounter: Payer: Self-pay | Admitting: Urology

## 2024-05-22 ENCOUNTER — Other Ambulatory Visit: Payer: Self-pay

## 2024-05-22 ENCOUNTER — Other Ambulatory Visit
Admission: RE | Admit: 2024-05-22 | Discharge: 2024-05-22 | Disposition: A | Discharge status: Home / Self Care | Attending: Urology | Admitting: Urology

## 2024-05-22 VITALS — BP 155/72 | HR 63 | Ht 68.0 in | Wt 124.2 lb

## 2024-05-22 DIAGNOSIS — C61 Malignant neoplasm of prostate: Secondary | ICD-10-CM

## 2024-05-22 DIAGNOSIS — N138 Other obstructive and reflux uropathy: Secondary | ICD-10-CM

## 2024-05-22 DIAGNOSIS — N401 Enlarged prostate with lower urinary tract symptoms: Secondary | ICD-10-CM

## 2024-05-22 LAB — PSA: Prostatic Specific Antigen: 2.27 ng/mL (ref 0.00–4.00)

## 2024-05-23 ENCOUNTER — Encounter: Payer: Self-pay | Admitting: Family Medicine

## 2024-05-23 ENCOUNTER — Ambulatory Visit: Admitting: Family Medicine

## 2024-05-23 ENCOUNTER — Ambulatory Visit: Payer: Self-pay | Admitting: Urology

## 2024-05-23 VITALS — BP 118/68 | HR 66 | Resp 16 | Ht 68.0 in | Wt 123.1 lb

## 2024-05-23 DIAGNOSIS — D708 Other neutropenia: Secondary | ICD-10-CM

## 2024-05-23 DIAGNOSIS — E1142 Type 2 diabetes mellitus with diabetic polyneuropathy: Secondary | ICD-10-CM

## 2024-05-23 DIAGNOSIS — E1169 Type 2 diabetes mellitus with other specified complication: Secondary | ICD-10-CM | POA: Diagnosis not present

## 2024-05-23 DIAGNOSIS — I2510 Atherosclerotic heart disease of native coronary artery without angina pectoris: Secondary | ICD-10-CM | POA: Diagnosis not present

## 2024-05-23 DIAGNOSIS — E785 Hyperlipidemia, unspecified: Secondary | ICD-10-CM

## 2024-05-23 DIAGNOSIS — G720 Drug-induced myopathy: Secondary | ICD-10-CM

## 2024-05-23 DIAGNOSIS — T466X5A Adverse effect of antihyperlipidemic and antiarteriosclerotic drugs, initial encounter: Secondary | ICD-10-CM

## 2024-05-23 DIAGNOSIS — K5904 Chronic idiopathic constipation: Secondary | ICD-10-CM

## 2024-05-23 DIAGNOSIS — G2581 Restless legs syndrome: Secondary | ICD-10-CM

## 2024-05-23 DIAGNOSIS — E538 Deficiency of other specified B group vitamins: Secondary | ICD-10-CM

## 2024-05-23 LAB — POCT GLYCOSYLATED HEMOGLOBIN (HGB A1C): Hemoglobin A1C: 7.5 % — AB (ref 4.0–5.6)

## 2024-05-23 MED ORDER — PIOGLITAZONE HCL 15 MG PO TABS: 15.0000 mg | ORAL_TABLET | Freq: Every day | ORAL | 1 refills | Status: DC

## 2024-05-23 MED ORDER — METFORMIN HCL ER 750 MG PO TB24: 750.0000 mg | ORAL_TABLET | Freq: Every day | ORAL | 1 refills | Status: DC

## 2024-05-23 NOTE — Progress Notes (Signed)
 Name: Austin Hunt   MRN: [id number]    DOB: 10/30/1941   Date:05/23/2024       Progress Note  Subjective  Chief Complaint  Chief Complaint  Patient presents with   Medical Management of Chronic Issues   Discussed the use of AI scribe software for clinical note transcription with the patient, who gave verbal consent to proceed.  History of Present Illness Austin Hunt is an 83 year old male with diabetes, dyslipidemia, and restless leg syndrome who presents for a regular follow-up visit.  He experiences leg pain primarily at night, which he associates with restless leg syndrome. The pain subsides upon getting up and moving around. He was previously prescribed Requip  for this condition, which he found helpful, but he is currently out of the medication.  He has been experiencing weight loss, having lost three pounds since his last visit. He notes that he has been gradually losing weight over the past few years, dropping from the 150s to the 120s. He is trying to increase his food intake to address this issue. He occasionally eats out and frequents a place called [employer] for meals.  He has  diabetes type 2 and A1c has improved to 7.5% from 8.2%. He is currently taking metformin  750 mg once daily and pioglitazone  15 mg. No symptoms of increased hunger, thirst, or frequent urination, noting that he typically wakes once or twice at night to use the bathroom.  He has dyslipidemia and is unable to take statins due to muscle aches. He is prescribed Vascepa  and ezetimibe , but he is currently out of Vascepa .  He has a history of an enlarged prostate and lower urinary tract symptoms, for which he underwent a TURP procedure ten years ago. He is currently taking tamsulosin . He recently saw a urologist who checked his PSA, which was slightly elevated at 2.27 ng/mL but not significantly.  He has chronic idiopathic constipation, which he manages with Miralax  as needed. His constipation 'goes and comes'  and he denies any blood in his stools.  He has  leukopenia with a white blood cell count ranging from 2.3 to 3.4. He previously saw a hematologist for this condition, but no specific cause was identified.  No chest pain, palpitations, or tightness, despite having a history of coronary artery atherosclerosis without angina.    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) 06/21/2020   Cervical pain (neck) 10/13/2016   Other neutropenia (HCC) 01/07/2016   Carotid artery narrowing 04/08/2015   Chronic idiopathic constipation 04/08/2015   H/O inguinal hernia repair 04/08/2015   Hyperlipidemia associated with type 2 diabetes mellitus (HCC) 04/08/2015   Low back pain 04/08/2015   MI (mitral incompetence) 04/08/2015   TI (tricuspid incompetence) 04/08/2015   Unilateral recurrent femoral hernia without obstruction or gangrene 05/23/2014   Right inguinal hernia 02/06/2014   Cataract 01/16/2014   Glaucoma 01/16/2014   Reflux esophagitis 06/21/2009   Atherosclerosis of native coronary artery of native heart without angina pectoris 04/03/2009   Benign enlargement of prostate 02/11/2009   Decreased libido 01/12/2008    Past  Surgical History:  Procedure Laterality Date   APPENDECTOMY  1954   BLADDER SURGERY  Oct 2015   CARDIAC CATHETERIZATION  2002   1 stent   COLONOSCOPY     EYE SURGERY  2011, 2013   cataracts   EYE SURGERY Right May 2016   glaucoma   HERNIA REPAIR Left 1967   HERNIA REPAIR Right 05-10-14   Right femoral hernia repair Dr Dessa with  PerFix plug, no onlay mesh.SABRA    PHOTOCOAGULATION WITH LASER Right 12/30/2015   Procedure: PHOTOCOAGULATION WITH LASER;  Surgeon: Donzell Arlyce Budd, MD;  Location: [facility];  Service: Ophthalmology;  Laterality: Right;  DIABETIC - oral meds IVA BLOCK   PROSTATE SURGERY  Oct 2015    Family History  Problem Relation Age of Onset   Diabetes Sister    Diabetes Brother    Diabetes Sister    Prostate cancer Neg Hx    Bladder Cancer Neg Hx     Social History   Tobacco Use   Smoking status: Never   Smokeless tobacco: Never   Tobacco comments:    smoking cessation materials not required  Substance Use Topics   Alcohol use: No     Current Outpatient Medications:    ACCU-CHEK GUIDE TEST test strip, USE 4 TIMES A DAY AS DIRECTED, Disp: 100 each, Rfl: 10   Accu-Chek Softclix Lancets lancets, TEST 4 TIMES DAILY, Disp: 100 each, Rfl: 5   aspirin 81 MG tablet, Take 81 mg by mouth daily. , Disp: , Rfl:    Blood Glucose Monitoring Suppl (ACCU-CHEK GUIDE ME) w/Device KIT, , Disp: , Rfl:    brimonidine-timolol (COMBIGAN) 0.2-0.5 % ophthalmic solution, Place 1 drop into both eyes every 12 (twelve) hours., Disp: , Rfl:    Cholecalciferol (VITAMIN D ) 50 MCG (2000 UT) CAPS, Take 1 capsule (2,000 Units total) by mouth daily., Disp: 30 capsule, Rfl: 0   clotrimazole -betamethasone  (LOTRISONE ) cream, Apply 1 application. topically at bedtime., Disp: 30 g, Rfl: 0   Cyanocobalamin  (B-12) 1000 MCG SUBL, Place 1 tablet under the tongue daily., Disp: 30 tablet, Rfl: 0   docusate sodium (COLACE) 100 MG capsule, Take 100 mg by mouth daily., Disp: , Rfl:    dorzolamide (TRUSOPT) 2 % ophthalmic solution, 1 drop 2 (two) times daily. , Disp: , Rfl:    ezetimibe  (ZETIA ) 10 MG tablet, Take 1 tablet (10 mg total) by mouth daily., Disp: 90 tablet, Rfl: 3   Garlic 10 MG CAPS, Take 15 mg by mouth daily. , Disp: , Rfl:    icosapent  Ethyl (VASCEPA ) 1 g capsule, Take 2 capsules (2 g total) by mouth 2 (two)  times daily., Disp: 480 capsule, Rfl: 1   ketoconazole  (NIZORAL ) 2 % cream, Apply to both feet and between toes once daily for 6 weeks., Disp: 60 g, Rfl: 1   latanoprost  (XALATAN ) 0.005 % ophthalmic solution, Place 1 drop into both eyes at bedtime. , Disp: , Rfl:    Magnesium 100 MG TABS, Take by mouth. , Disp: , Rfl:    metFORMIN  (GLUCOPHAGE -XR) 750 MG 24 hr tablet, Take 1 tablet (750 mg total) by mouth daily with breakfast., Disp: 90 tablet, Rfl: 0   Multiple Vitamin (MULTIVITAMIN) tablet, Take 1 tablet by mouth daily., Disp: , Rfl:    pioglitazone  (ACTOS ) 15 MG tablet, Take 1 tablet (15 mg total) by mouth daily., Disp: 90 tablet, Rfl: 1   polyethylene glycol powder (GLYCOLAX /MIRALAX ) 17 GM/SCOOP powder, Take 17 g by mouth daily., Disp: 3350  g, Rfl: 1   rOPINIRole  (REQUIP ) 0.5 MG tablet, Take 1 tablet (0.5 mg total) by mouth at bedtime., Disp: 90 tablet, Rfl: 1   sildenafil  (REVATIO ) 20 MG tablet, Take 1-5 tablets one hour prior to intercourse as needed., Disp: 30 tablet, Rfl: 6   tamsulosin  (FLOMAX ) 0.4 MG CAPS capsule, Take 1 capsule (0.4 mg total) by mouth daily., Disp: 90 capsule, Rfl: 3   triamcinolone (KENALOG) 0.025 % cream, Apply topically., Disp: , Rfl:    vitamin C (ASCORBIC ACID) 500 MG tablet, Take 500 mg by mouth 2 (two) times daily. Chewable, Disp: , Rfl:    Zinc Acetate 50 MG CAPS, Take 1 capsule by mouth daily. , Disp: , Rfl:   Allergies  Allergen Reactions   Colesevelam Other (See Comments)   Penicillins Itching and Other (See Comments)   Statins Other (See Comments)    myalgia   Welchol [Colesevelam Hcl]     I personally reviewed active problem list, medication list, allergies, family history with the patient/caregiver today.   ROS  Ten systems reviewed and is negative except as mentioned in HPI    Objective Physical Exam  CONSTITUTIONAL: Patient appears well-developed and thin, looks frail.  No distress. HEENT: Head atraumatic, normocephalic, neck  supple. CARDIOVASCULAR: Normal rate, regular rhythm and normal heart sounds.  No murmur heard. No BLE edema. PULMONARY: Effort normal and breath sounds normal. No respiratory distress. ABDOMINAL: There is no tenderness or distention. MUSCULOSKELETAL: Normal gait. Without gross motor or sensory deficit. PSYCHIATRIC: Patient has a normal mood and affect. behavior is normal. Judgment and thought content normal.  Vitals:   05/23/24 1058  BP: 118/68  Pulse: 66  Resp: 16  SpO2: 99%  Weight: 123 lb 1.6 oz (55.8 kg)  Height: 5' 8 (1.727 m)    Body mass index is 18.72 kg/m.  Recent Results (from the past 2160 hours)  Urinalysis, Complete     Status: Abnormal   Collection Time: 03/14/24  2:37 PM  Result Value Ref Range   Specific Gravity, UA 1.020 1.005 - 1.030   pH, UA 6.0 5.0 - 7.5   Color, UA Yellow Yellow   Appearance Ur Clear Clear   Leukocytes,UA Negative Negative   Protein,UA Negative Negative/Trace   Glucose, UA 1+ (A) Negative   Ketones, UA Negative Negative   RBC, UA Negative Negative   Bilirubin, UA Negative Negative   Urobilinogen, Ur 0.2 0.2 - 1.0 mg/dL   Nitrite, UA Negative Negative   Microscopic Examination Comment     Comment: Microscopic follows if indicated.   Microscopic Examination See below:     Comment: Microscopic was indicated and was performed.  Microscopic Examination     Status: None   Collection Time: 03/14/24  2:37 PM   Urine  Result Value Ref Range   WBC, UA 0-5 0 - 5 /hpf   RBC, Urine 0-2 0 - 2 /hpf   Epithelial Cells (non renal) 0-10 0 - 10 /hpf   Bacteria, UA None seen None seen/Few  BLADDER SCAN AMB NON-IMAGING     Status: None   Collection Time: 03/14/24  2:40 PM  Result Value Ref Range   Scan Result 22 ml   PSA     Status: None   Collection Time: 05/22/24  1:50 PM  Result Value Ref Range   Prostatic Specific Antigen 2.27 0.00 - 4.00 ng/mL    Comment: (NOTE) While PSA levels of <=4.00 ng/ml are reported as reference range, some men  with levels  below 4.00 ng/ml can have prostate cancer and many men with PSA above 4.00 ng/ml do not have prostate cancer.  Other tests such as free PSA, age specific reference ranges, PSA velocity and PSA doubling time may be helpful especially in men less than 24 years old. Performed at [facility], [address], [place], [place] [address]   POCT glycosylated hemoglobin (Hb A1C)     Status: Abnormal   Collection Time: 05/23/24 11:04 AM  Result Value Ref Range   Hemoglobin A1C 7.5 (A) 4.0 - 5.6 %   HbA1c POC (<> result, manual entry)     HbA1c, POC (prediabetic range)     HbA1c, POC (controlled diabetic range)      Diabetic Foot Exam:     PHQ2/9:    05/23/2024   10:51 AM 03/16/2024    9:37 AM 01/14/2024    9:23 AM 08/27/2023    9:55 AM 08/10/2023   11:33 AM  Depression screen PHQ 2/9  Decreased Interest 0 0 0 0 0  Down, Depressed, Hopeless 0 0 0 0 0  PHQ - 2 Score 0 0 0 0 0  Altered sleeping   0  0  Tired, decreased energy   0  0  Change in appetite   0  0  Feeling bad or failure about yourself    0  0  Trouble concentrating   0  0  Moving slowly or fidgety/restless   0  0  Suicidal thoughts   0  0  PHQ-9 Score   0  0  Difficult doing work/chores   Not difficult at all      phq 9 is negative  Fall Risk:    05/23/2024   10:51 AM 03/16/2024    9:37 AM 01/14/2024    9:23 AM 08/27/2023    9:50 AM 08/10/2023   11:33 AM  Fall Risk   Falls in the past year? 0 0 0 0 0  Number falls in past yr: 0 0 0 0 0  Injury with Fall? 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 Peripheral neuropathy persists with leg pain. No hyperglycemia symptoms. - Continue metformin  750 mg once daily. - Continue pioglitazone  15 mg once daily.  Dyslipidemia LDL cholesterol elevated to 123 mg/dL. Statins not tolerated. Managed with Vascepa  and ezetimibe . Diet insufficient due to underweight status. - Instruct to pick up Vascepa  and ezetimibe  from pharmacy. - Continue Vascepa , two capsules twice daily. - Continue ezetimibe  as prescribed.  Atherosclerotic heart disease of native coronary artery without angina Atherosclerotic heart disease without angina. No chest pain or palpitations. - Continue current management and monitoring.  Unintentional weight loss Significant weight loss stabilized at 126 lbs. Emphasized nutritional intake to prevent further loss. - Encourage increased protein intake with meals. - Advise eating eggs daily for protein.  Restless legs syndrome Restless legs syndrome causing night discomfort. Previously managed with Requip . - Refill Requip  prescription. - Instruct to pick up medication from pharmacy.  Benign prostatic hyperplasia with lower urinary tract symptoms Benign prostatic hyperplasia with lower urinary tract symptoms. PSA slightly  elevated. Managed with tamsulosin . - Continue tamsulosin  as prescribed.  Chronic idiopathic constipation Chronic idiopathic constipation with intermittent symptoms. Managed with Miralax  as needed. - Continue Miralax  as needed for constipation.  Other Leukopenia stable low white blood cell count. Evaluated by hematologist with no specific cause identified.  General Health Maintenance General health maintenance discussed, including vitamin supplementation and dietary habits. - Instruct to take B12 every other day. - Instruct to take iron supplements on alternate days with B12. - Continue vitamin D  supplementation.

## 2024-05-24 ENCOUNTER — Telehealth: Payer: Self-pay

## 2024-05-24 ENCOUNTER — Other Ambulatory Visit (HOSPITAL_COMMUNITY): Payer: Self-pay

## 2024-05-24 ENCOUNTER — Other Ambulatory Visit: Payer: Self-pay | Admitting: Family Medicine

## 2024-05-24 DIAGNOSIS — E1169 Type 2 diabetes mellitus with other specified complication: Secondary | ICD-10-CM

## 2024-05-24 MED ORDER — VASCEPA 1 G PO CAPS: 2.0000 g | ORAL_CAPSULE | Freq: Two times a day (BID) | ORAL | 1 refills | Status: DC

## 2024-05-24 NOTE — Telephone Encounter (Signed)
 Brand name preferred, please send to [facility] pharmacy. Pt hs not picked it up yet

## 2024-05-24 NOTE — Telephone Encounter (Signed)
 Good morning, his Vascepa  is covered his plan just prefers brand name instead of generic. Upon running a test claim its just too soon to fill, it was last filled 05/23/24 and the next fill is 06/17/24.

## 2024-05-24 NOTE — Telephone Encounter (Signed)
 Is the Vascepa  covered? Can we try PA?

## 2024-05-24 NOTE — Telephone Encounter (Signed)
 Copied from CRM [id number]. Topic: Clinical - Prescription Issue >> May 24, 2024 10:13 AM Turkey B wrote: Reason for CRM: pt called in says omega 3 isnt covered by insurance. He is asking for alternative

## 2024-05-26 ENCOUNTER — Ambulatory Visit: Payer: Self-pay | Admitting: Urology

## 2024-07-04 DIAGNOSIS — H401133 Primary open-angle glaucoma, bilateral, severe stage: Secondary | ICD-10-CM | POA: Diagnosis not present

## 2024-07-11 DIAGNOSIS — Z0100 Encounter for examination of eyes and vision without abnormal findings: Secondary | ICD-10-CM | POA: Diagnosis not present

## 2024-07-11 DIAGNOSIS — E119 Type 2 diabetes mellitus without complications: Secondary | ICD-10-CM | POA: Diagnosis not present

## 2024-07-11 DIAGNOSIS — H353131 Nonexudative age-related macular degeneration, bilateral, early dry stage: Secondary | ICD-10-CM | POA: Diagnosis not present

## 2024-07-11 DIAGNOSIS — Z961 Presence of intraocular lens: Secondary | ICD-10-CM | POA: Diagnosis not present

## 2024-07-11 DIAGNOSIS — H401133 Primary open-angle glaucoma, bilateral, severe stage: Secondary | ICD-10-CM | POA: Diagnosis not present

## 2024-07-11 LAB — HM DIABETES EYE EXAM

## 2024-07-13 ENCOUNTER — Encounter: Payer: Self-pay | Admitting: Family Medicine

## 2024-08-14 ENCOUNTER — Ambulatory Visit (INDEPENDENT_AMBULATORY_CARE_PROVIDER_SITE_OTHER): Admitting: Podiatry

## 2024-08-14 DIAGNOSIS — Z91199 Patient's noncompliance with other medical treatment and regimen due to unspecified reason: Secondary | ICD-10-CM

## 2024-08-14 NOTE — Progress Notes (Signed)
 1. No-show for appointment

## 2024-08-21 ENCOUNTER — Ambulatory Visit (INDEPENDENT_AMBULATORY_CARE_PROVIDER_SITE_OTHER): Admitting: Podiatry

## 2024-08-21 ENCOUNTER — Encounter: Payer: Self-pay | Admitting: Podiatry

## 2024-08-21 DIAGNOSIS — E1142 Type 2 diabetes mellitus with diabetic polyneuropathy: Secondary | ICD-10-CM | POA: Diagnosis not present

## 2024-08-21 DIAGNOSIS — M79676 Pain in unspecified toe(s): Secondary | ICD-10-CM

## 2024-08-21 DIAGNOSIS — B351 Tinea unguium: Secondary | ICD-10-CM | POA: Diagnosis not present

## 2024-08-23 ENCOUNTER — Ambulatory Visit: Admitting: Podiatry

## 2024-08-27 NOTE — Progress Notes (Signed)
  Subjective:  Patient ID: Austin Hunt, male    DOB: 11-07-1940,  MRN: [id number]  Austin Hunt presents to clinic today for at risk foot care with history of diabetic neuropathy and painful mycotic toenails x 10 which interfere with daily activities. Pain is relieved with periodic professional debridement.  Chief Complaint  Patient presents with   Toe Pain    Diabetic foot care. Dr. Glenard is his PCP. Last visit was in July. His A1c is 7.0 per his report.   New problem(s): None.   PCP is Sowles, Krichna, MD.  Allergies  Allergen Reactions   Colesevelam Other (See Comments)   Penicillins Itching and Other (See Comments)   Statins Other (See Comments)    myalgia   Welchol [Colesevelam Hcl]     Review of Systems: Negative except as noted in the HPI.  Objective: No changes noted in today's physical examination. There were no vitals filed for this visit. Austin Hunt is a pleasant 83 y.o. male thin build in NAD. AAO x 3.  Vascular Examination: Vascular status intact b/l with palpable pedal pulses. CFT immediate b/l. No edema. No pain with calf compression b/l. Skin temperature gradient WNL b/l. No ischemia or gangrene noted b/l LE. No cyanosis or clubbing noted b/l LE.  Neurological Examination: Pt has subjective symptoms of neuropathy.Sensation grossly intact b/l with 10 gram monofilament. Vibratory sensation intact b/l.   Dermatological Examination: Pedal skin with normal turgor, texture and tone b/l. Toenails 1-5 b/l thick, discolored, elongated with subungual debris and pain on dorsal palpation. No hyperkeratotic lesions noted b/l.   Musculoskeletal Examination: Muscle strength 5/5 to b/l LE. HAV with bunion deformity noted b/l LE. Patient ambulates independent of any assistive aids.  Radiographs: None  Assessment/Plan: 1. Pain due to onychomycosis of toenail   2. Type 2 diabetes, controlled, with peripheral neuropathy [facility])   Consent given for treatment. Patient  examined. All patient's and/or POA's questions/concerns addressed on today's visit. Mycotic toenails 1-5 b/l debrided in length and girth without incident. Continue foot and shoe inspections daily. Monitor blood glucose per PCP/Endocrinologist's recommendations.Continue soft, supportive shoe gear daily. Report any pedal injuries to medical professional. Call office if there are any quesitons/concerns. -Patient/POA to call should there be question/concern in the interim.   Return in about 3 months (around 11/21/2024).  Austin Hunt Merlin, DPM      [place] LOCATION: [address], [place] [address]                   Office [phone]   [facility] LOCATION: [address] [place], [place] [address] Office [phone]

## 2024-08-31 ENCOUNTER — Ambulatory Visit: Payer: Medicare PPO

## 2024-09-25 ENCOUNTER — Ambulatory Visit: Admitting: Family Medicine

## 2024-09-25 ENCOUNTER — Encounter: Payer: Self-pay | Admitting: Family Medicine

## 2024-09-25 VITALS — BP 116/74 | HR 49 | Resp 16 | Ht 68.0 in | Wt 117.9 lb

## 2024-09-25 DIAGNOSIS — R1312 Dysphagia, oropharyngeal phase: Secondary | ICD-10-CM

## 2024-09-25 DIAGNOSIS — T466X5D Adverse effect of antihyperlipidemic and antiarteriosclerotic drugs, subsequent encounter: Secondary | ICD-10-CM

## 2024-09-25 DIAGNOSIS — I2510 Atherosclerotic heart disease of native coronary artery without angina pectoris: Secondary | ICD-10-CM

## 2024-09-25 DIAGNOSIS — E785 Hyperlipidemia, unspecified: Secondary | ICD-10-CM

## 2024-09-25 DIAGNOSIS — E1169 Type 2 diabetes mellitus with other specified complication: Secondary | ICD-10-CM | POA: Diagnosis not present

## 2024-09-25 DIAGNOSIS — E538 Deficiency of other specified B group vitamins: Secondary | ICD-10-CM | POA: Diagnosis not present

## 2024-09-25 DIAGNOSIS — G720 Drug-induced myopathy: Secondary | ICD-10-CM | POA: Diagnosis not present

## 2024-09-25 DIAGNOSIS — Z7984 Long term (current) use of oral hypoglycemic drugs: Secondary | ICD-10-CM

## 2024-09-25 DIAGNOSIS — G2581 Restless legs syndrome: Secondary | ICD-10-CM

## 2024-09-25 DIAGNOSIS — E1142 Type 2 diabetes mellitus with diabetic polyneuropathy: Secondary | ICD-10-CM | POA: Diagnosis not present

## 2024-09-25 DIAGNOSIS — K5904 Chronic idiopathic constipation: Secondary | ICD-10-CM | POA: Diagnosis not present

## 2024-09-25 LAB — POCT GLYCOSYLATED HEMOGLOBIN (HGB A1C): Hemoglobin A1C: 7.2 % — AB (ref 4.0–5.6)

## 2024-09-25 MED ORDER — ROPINIROLE HCL 0.5 MG PO TABS: 0.5000 mg | ORAL_TABLET | Freq: Every day | ORAL | 1 refills | Status: AC

## 2024-09-25 MED ORDER — VASCEPA 1 G PO CAPS: 2.0000 g | ORAL_CAPSULE | Freq: Two times a day (BID) | ORAL | 1 refills | Status: AC

## 2024-09-25 MED ORDER — PIOGLITAZONE HCL 15 MG PO TABS: 15.0000 mg | ORAL_TABLET | Freq: Every day | ORAL | 1 refills | Status: AC

## 2024-09-25 MED ORDER — POLYETHYLENE GLYCOL 3350 17 GM/SCOOP PO POWD: 17.0000 g | Freq: Two times a day (BID) | ORAL | 1 refills | Status: AC | PRN

## 2024-09-25 MED ORDER — GLIPIZIDE 2.5 MG PO TABS
2.5000 mg | ORAL_TABLET | Freq: Every day | ORAL | 0 refills | Status: AC
Start: 2024-09-25 — End: ?

## 2024-09-25 MED ORDER — POLYETHYLENE GLYCOL 3350 17 GM/SCOOP PO POWD: 17.0000 g | Freq: Every day | ORAL | 1 refills | Status: AC

## 2024-09-25 NOTE — Progress Notes (Signed)
 Name: Austin Hunt   MRN: [id number]    DOB: [birth year]   Date:09/25/2024       Progress Note  Subjective  Chief Complaint  Chief Complaint  Patient presents with   Medical Management of Chronic Issues   Discussed the use of AI scribe software for clinical note transcription with the patient, who gave verbal consent to proceed.  History of Present Illness Austin Hunt is an 83 year old male with type 2 diabetes and hyperlipidemia who presents with weight loss and concerns about medication side effects.  He has experienced significant weight loss, which he attributes to metformin  use. Despite increasing his food intake, his weight has decreased from 123 pounds to 117 pounds, resulting in a BMI below 18. He recalls a nurse mentioning that metformin  could cause weight loss. He eats regular meals, including oatmeal for breakfast and a sandwich for lunch.  He has type 2 diabetes and is currently taking 750 mg of metformin  and pioglitazone . His HbA1c has improved from 8.2 in March to 7.2 currently. He experiences occasional hypoglycemia, with blood sugar readings sometimes dropping to 50, and once as low as 39, although he later clarified that it was 129.  He experiences nocturia, urinating four times last night, which is unusual for him. No dysuria. He was previously under the care of Dr. Rosina Riis for this issue but is unsure of her current whereabouts.  He has a history of statin myopathy and is taking ezetimibe  and Vascepa  for hyperlipidemia. He cannot take statins due to myopathy.  He reports improvement in his peripheral neuropathy symptoms, with less burning and pain in his feet than before.  He has chronic idiopathic constipation, which he manages by drinking more water and eating more fruit. He experienced constipation this morning and has not been using Miralax .  He has a history of B12 deficiency and continues to take sublingual B12 supplements.  He has a history of  atherosclerosis of the native coronary artery but reports no chest pain or angina pectoris. No pain, including arthritis pain, and shares a home remedy with his cousin for arthritis pain.  He has a history of a coronary stent placement. No dizziness or syncope.  He mentions a sensation of food not going down properly when eating, for which he has an upcoming appointment with a gastroenterologist. 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) 06/21/2020   Cervical pain (neck) 10/13/2016   Other neutropenia 01/07/2016   Carotid artery narrowing 04/08/2015   Chronic idiopathic constipation 04/08/2015   H/O inguinal hernia repair 04/08/2015   Hyperlipidemia associated with type 2 diabetes mellitus (HCC) 04/08/2015   Low back pain 04/08/2015   MI (mitral incompetence) 04/08/2015   TI (tricuspid incompetence) 04/08/2015   Unilateral recurrent femoral hernia without obstruction or gangrene 05/23/2014   Right inguinal hernia 02/06/2014   Cataract 01/16/2014   Glaucoma 01/16/2014   Reflux esophagitis 06/21/2009   Atherosclerosis of native coronary artery of native heart without angina pectoris 04/03/2009   Benign enlargement of prostate 02/11/2009  Decreased libido 01/12/2008    Past Surgical History:  Procedure Laterality Date   APPENDECTOMY  1954   BLADDER SURGERY  Oct 2015   CARDIAC CATHETERIZATION  2002   1 stent   COLONOSCOPY     EYE SURGERY  2011, 2013   cataracts   EYE SURGERY Right May 2016   glaucoma   HERNIA REPAIR Left  1967   HERNIA REPAIR Right 05-10-14   Right femoral hernia repair Dr Dessa with PerFix plug, no onlay mesh.SABRA    PHOTOCOAGULATION WITH LASER Right 12/30/2015   Procedure: PHOTOCOAGULATION WITH LASER;  Surgeon: Donzell Arlyce Budd, MD;  Location: [facility];  Service: Ophthalmology;  Laterality: Right;  DIABETIC - oral meds IVA BLOCK   PROSTATE SURGERY  Oct 2015    Family History  Problem Relation Age of Onset   Diabetes Sister    Diabetes Brother    Diabetes Sister    Prostate cancer Neg Hx    Bladder Cancer Neg Hx     Social History   Tobacco Use   Smoking status: Never   Smokeless tobacco: Never   Tobacco comments:    smoking cessation materials not required  Substance Use Topics   Alcohol use: No     Current Outpatient Medications:    ACCU-CHEK GUIDE TEST test strip, USE 4 TIMES A DAY AS DIRECTED, Disp: 100 each, Rfl: 10   Accu-Chek Softclix Lancets lancets, TEST 4 TIMES DAILY, Disp: 100 each, Rfl: 5   aspirin 81 MG tablet, Take 81 mg by mouth daily. , Disp: , Rfl:    Blood Glucose Monitoring Suppl (ACCU-CHEK GUIDE ME) w/Device KIT, , Disp: , Rfl:    brimonidine-timolol (COMBIGAN) 0.2-0.5 % ophthalmic solution, Place 1 drop into both eyes every 12 (twelve) hours., Disp: , Rfl:    Cholecalciferol (VITAMIN D ) 50 MCG (2000 UT) CAPS, Take 1 capsule (2,000 Units total) by mouth daily., Disp: 30 capsule, Rfl: 0   clotrimazole -betamethasone  (LOTRISONE ) cream, Apply 1 application. topically at bedtime., Disp: 30 g, Rfl: 0   Cyanocobalamin  (B-12) 1000 MCG SUBL, Place 1 tablet under the tongue daily., Disp: 30 tablet, Rfl: 0   docusate sodium (COLACE) 100 MG capsule, Take 100 mg by mouth daily., Disp: , Rfl:    dorzolamide (TRUSOPT) 2 % ophthalmic solution, 1 drop 2 (two) times daily. , Disp: , Rfl:    ezetimibe  (ZETIA ) 10 MG tablet, Take 1 tablet (10 mg total) by mouth daily., Disp: 90 tablet, Rfl: 3   Garlic 10 MG CAPS, Take 15 mg by mouth daily. , Disp: , Rfl:     ketoconazole  (NIZORAL ) 2 % cream, Apply to both feet and between toes once daily for 6 weeks., Disp: 60 g, Rfl: 1   latanoprost  (XALATAN ) 0.005 % ophthalmic solution, Place 1 drop into both eyes at bedtime. , Disp: , Rfl:    Magnesium 100 MG TABS, Take by mouth. , Disp: , Rfl:    metFORMIN  (GLUCOPHAGE -XR) 750 MG 24 hr tablet, Take 1 tablet (750 mg total) by mouth daily with breakfast., Disp: 90 tablet, Rfl: 1   Multiple Vitamin (MULTIVITAMIN) tablet, Take 1 tablet by mouth daily., Disp: , Rfl:    pioglitazone  (ACTOS ) 15 MG tablet, Take 1 tablet (15 mg total) by mouth daily., Disp: 90 tablet, Rfl: 1   polyethylene glycol powder (GLYCOLAX /MIRALAX ) 17 GM/SCOOP powder, Take 17 g by mouth daily., Disp: 3350 g, Rfl: 1   rOPINIRole  (REQUIP ) 0.5 MG tablet, Take 1 tablet (0.5 mg total) by mouth  at bedtime., Disp: 90 tablet, Rfl: 1   sildenafil  (REVATIO ) 20 MG tablet, Take 1-5 tablets one hour prior to intercourse as needed., Disp: 30 tablet, Rfl: 6   tamsulosin  (FLOMAX ) 0.4 MG CAPS capsule, Take 1 capsule (0.4 mg total) by mouth daily., Disp: 90 capsule, Rfl: 3   triamcinolone (KENALOG) 0.025 % cream, Apply topically., Disp: , Rfl:    VASCEPA  1 g capsule, Take 2 capsules (2 g total) by mouth 2 (two) times daily., Disp: 480 capsule, Rfl: 1   vitamin C (ASCORBIC ACID) 500 MG tablet, Take 500 mg by mouth 2 (two) times daily. Chewable, Disp: , Rfl:    Zinc Acetate 50 MG CAPS, Take 1 capsule by mouth daily. , Disp: , Rfl:   Allergies  Allergen Reactions   Colesevelam Other (See Comments)   Penicillins Itching and Other (See Comments)   Statins Other (See Comments)    myalgia   Welchol [Colesevelam Hcl]     I personally reviewed active problem list, medication list, allergies, family history with the patient/caregiver today.   ROS  Ten systems reviewed and is negative except as mentioned in HPI    Objective Physical Exam VITALS: P- 49 MEASUREMENTS: Weight- 117. CONSTITUTIONAL: Patient appears  well-developed and well-nourished.  No distress. HEENT: Head atraumatic, normocephalic, neck supple. CARDIOVASCULAR: Normal rate, regular rhythm and normal heart sounds.  No murmur heard. No BLE edema. PULMONARY: Effort normal and breath sounds normal. No respiratory distress. ABDOMINAL: There is no tenderness or distention. MUSCULOSKELETAL: Normal gait. Without gross motor or sensory deficit. PSYCHIATRIC: Patient has a normal mood and affect. behavior is normal. Judgment and thought content normal.  Vitals:   09/25/24 1042  BP: 116/74  Pulse: (!) 49  Resp: 16  SpO2: 98%  Weight: 117 lb 14.4 oz (53.5 kg)  Height: 5' 8 (1.727 m)    Body mass index is 17.93 kg/m.  Recent Results (from the past 2160 hours)  HM DIABETES EYE EXAM     Status: None   Collection Time: 07/11/24  7:25 AM  Result Value Ref Range   HM Diabetic Eye Exam No Retinopathy No Retinopathy    Comment: ABST BY HIM  POCT glycosylated hemoglobin (Hb A1C)     Status: Abnormal   Collection Time: 09/25/24 10:49 AM  Result Value Ref Range   Hemoglobin A1C 7.2 (A) 4.0 - 5.6 %   HbA1c POC (<> result, manual entry)     HbA1c, POC (prediabetic range)     HbA1c, POC (controlled diabetic range)      Diabetic Foot Exam:     PHQ2/9:    09/25/2024   10:36 AM 05/23/2024   10:51 AM 03/16/2024    9:37 AM 01/14/2024    9:23 AM 08/27/2023    9:55 AM  Depression screen PHQ 2/9  Decreased Interest 0 0 0 0 0  Down, Depressed, Hopeless 0 0 0 0 0  PHQ - 2 Score 0 0 0 0 0  Altered sleeping    0   Tired, decreased energy    0   Change in appetite    0   Feeling bad or failure about yourself     0   Trouble concentrating    0   Moving slowly or fidgety/restless    0   Suicidal thoughts    0   PHQ-9 Score    0    Difficult doing work/chores    Not difficult at all      Data saved  with a previous flowsheet row definition    phq 9 is negative  Fall Risk:    09/25/2024   10:36 AM 05/23/2024   10:51 AM 03/16/2024     9:37 AM 01/14/2024    9:23 AM 08/27/2023    9:50 AM  Fall Risk   Falls in the past year? 0 0 0 0 0  Number falls in past yr: 0 0 0 0 0  Injury with Fall? 0 0 0 0 0  Risk for fall due to : No Fall Risks No Fall Risks No Fall Risks No Fall Risks No Fall Risks  Follow up Falls evaluation completed Falls evaluation completed Falls prevention discussed;Education provided;Falls evaluation completed Falls prevention discussed;Education provided;Falls evaluation completed Education provided;Falls prevention discussed      Assessment & Plan Type 2 diabetes mellitus with diabetic polyneuropathy , dyslipidemia A1c improved to 7.2. Peripheral neuropathy symptoms improved. Metformin  may contribute to weight loss. Glipizide  considered with caution for hypoglycemia. Insulin therapy declined. - Discontinued metformin . - Initiated glipizide  2.5 mg before largest meal. - Instructed to monitor blood glucose for hypoglycemia. - Scheduled follow-up in six weeks.  Atherosclerotic heart disease of native coronary artery without angina pectoris No angina. Heart rate low at 49 bpm without dizziness or syncope. Previous coronary stent placement noted. - Monitor heart rate and symptoms.  Chronic idiopathic constipation Improved bowel movements with increased water intake and dietary changes. Discontinuation of metformin  may exacerbate constipation. - Recommended Miralax . - Encouraged continued dietary modifications, including increased fruit intake.  Deficiency of other specified B group vitamins Currently taking sublingual B12 supplementation. - Continue sublingual B12 supplementation.  Drug-induced myopathy Statins contraindicated due to myopathy. Managed with ezetimibe  for cholesterol control. - Continue ezetimibe  and Vascepa .  Dysphagia, oropharyngeal phase Sensation of food getting stuck in esophagus. Scheduled for gastroenterology evaluation and possible esophageal dilation. - Proceed with  gastroenterology appointment.  Underweight BMI below 18. Recent weight loss possibly related to metformin  and dysphagia. Emphasis on weight gain. - Discontinued metformin . - Encouraged increased caloric intake and less restrictive diet. - Scheduled follow-up in six weeks.

## 2024-10-04 ENCOUNTER — Other Ambulatory Visit (HOSPITAL_COMMUNITY): Payer: Self-pay

## 2024-11-03 ENCOUNTER — Encounter: Payer: Self-pay | Admitting: Gastroenterology

## 2024-11-03 ENCOUNTER — Other Ambulatory Visit: Payer: Self-pay

## 2024-11-03 ENCOUNTER — Ambulatory Visit
Admission: RE | Admit: 2024-11-03 | Discharge: 2024-11-03 | Disposition: A | Discharge status: Home / Self Care | Attending: Gastroenterology | Admitting: Gastroenterology

## 2024-11-03 ENCOUNTER — Ambulatory Visit: Admitting: Anesthesiology

## 2024-11-03 ENCOUNTER — Encounter
Admission: RE | Disposition: A | Discharge status: Home / Self Care | Payer: Self-pay | Source: Home / Self Care | Attending: Gastroenterology

## 2024-11-03 DIAGNOSIS — K2970 Gastritis, unspecified, without bleeding: Secondary | ICD-10-CM | POA: Insufficient documentation

## 2024-11-03 DIAGNOSIS — K3189 Other diseases of stomach and duodenum: Secondary | ICD-10-CM | POA: Insufficient documentation

## 2024-11-03 DIAGNOSIS — R1310 Dysphagia, unspecified: Secondary | ICD-10-CM | POA: Diagnosis present

## 2024-11-03 DIAGNOSIS — I2510 Atherosclerotic heart disease of native coronary artery without angina pectoris: Secondary | ICD-10-CM | POA: Insufficient documentation

## 2024-11-03 DIAGNOSIS — E119 Type 2 diabetes mellitus without complications: Secondary | ICD-10-CM | POA: Insufficient documentation

## 2024-11-03 HISTORY — PX: ESOPHAGOGASTRODUODENOSCOPY: SHX5428

## 2024-11-03 HISTORY — PX: BIOPSY OF SKIN SUBCUTANEOUS TISSUE AND/OR MUCOUS MEMBRANE: SHX6741

## 2024-11-03 HISTORY — PX: BALLOON DILATION: SHX5330

## 2024-11-03 LAB — GLUCOSE, CAPILLARY: Glucose-Capillary: 117 mg/dL — ABNORMAL HIGH (ref 70–99)

## 2024-11-03 SURGERY — EGD (ESOPHAGOGASTRODUODENOSCOPY)
Anesthesia: General

## 2024-11-03 MED ORDER — PROPOFOL 10 MG/ML IV BOLUS
INTRAVENOUS | Status: AC
  Filled 2024-11-03: qty 20

## 2024-11-03 MED ORDER — LIDOCAINE HCL (PF) 2 % IJ SOLN
INTRAMUSCULAR | Status: AC
  Filled 2024-11-03: qty 5

## 2024-11-03 MED ORDER — LIDOCAINE HCL (CARDIAC) PF 100 MG/5ML IV SOSY
PREFILLED_SYRINGE | INTRAVENOUS | Status: DC | PRN
  Administered 2024-11-03: 60 mg via INTRAVENOUS

## 2024-11-03 MED ORDER — GLYCOPYRROLATE 0.2 MG/ML IJ SOLN
INTRAMUSCULAR | Status: AC
  Filled 2024-11-03: qty 1

## 2024-11-03 MED ORDER — DEXMEDETOMIDINE HCL IN NACL 80 MCG/20ML IV SOLN
INTRAVENOUS | Status: DC | PRN
  Administered 2024-11-03: 8 ug via INTRAVENOUS

## 2024-11-03 MED ORDER — PROPOFOL 10 MG/ML IV BOLUS
INTRAVENOUS | Status: DC | PRN
  Administered 2024-11-03: 50 mg via INTRAVENOUS
  Administered 2024-11-03 (×2): 20 mg via INTRAVENOUS

## 2024-11-03 MED ORDER — SODIUM CHLORIDE 0.9 % IV SOLN: INTRAVENOUS | Status: DC

## 2024-11-03 NOTE — Anesthesia Postprocedure Evaluation (Signed)
"   Anesthesia Post Note  Patient: Austin Hunt  Procedure(s) Performed: EGD (ESOPHAGOGASTRODUODENOSCOPY) BALLOON DILATION BIOPSY, GI  Anesthesia Type: General Anesthetic complications: no   No notable events documented.   Last Vitals:  Vitals:   11/03/24 1002 11/03/24 1012  BP: 109/60 121/63  Pulse: (!) 51 (!) 53  Resp: 12 17  Temp:    SpO2: 100% 100%    Last Pain:  Vitals:   11/03/24 1012  TempSrc:   PainSc: 0-No pain                 VAN STAVEREN,Concettina Leth      "

## 2024-11-03 NOTE — H&P (Signed)
 "  Ruel Kung , MD [address], [address], [place], [place], [address] Phone: [phone] Fax: [phone]  Primary Care Physician:  Glenard Mire, MD   Pre-Procedure History & Physical: HPI:  Austin Hunt is a 84 y.o. male is here for an endoscopy    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    Diabetes mellitus without complication (HCC)    Glaucoma    Headache    rare - 1x/mo   Nocturia    Prostate cancer [facility])     Past Surgical History:  Procedure Laterality Date   APPENDECTOMY  1954   BLADDER SURGERY  Oct 2015   CARDIAC CATHETERIZATION  2002   1 stent   COLONOSCOPY     EYE SURGERY  2011, 2013   cataracts   EYE SURGERY Right May 2016   glaucoma   HERNIA REPAIR Left 1967   HERNIA REPAIR Right 05-10-14   Right femoral hernia repair Dr Dessa with PerFix plug, no onlay mesh.SABRA    PHOTOCOAGULATION WITH LASER Right 12/30/2015   Procedure: PHOTOCOAGULATION WITH LASER;  Surgeon: Donzell Arlyce Budd, MD;  Location: [facility];  Service: Ophthalmology;  Laterality: Right;  DIABETIC - oral meds IVA BLOCK   PROSTATE SURGERY  Oct 2015    Prior to Admission medications  Medication Sig Start Date End Date Taking? Authorizing Provider  ACCU-CHEK GUIDE TEST test strip USE 4 TIMES A DAY AS DIRECTED 04/05/24   Sowles, Krichna, MD  Accu-Chek Softclix Lancets lancets TEST 4 TIMES DAILY 12/28/23   Sowles, Krichna, MD  aspirin 81 MG tablet Take 81 mg by mouth daily.     [provider]  Blood Glucose Monitoring Suppl (ACCU-CHEK GUIDE ME) w/Device KIT  08/28/20   [provider]  brimonidine-timolol (COMBIGAN) 0.2-0.5 % ophthalmic solution Place 1 drop into both eyes every 12 (twelve) hours.    [provider]  Cholecalciferol (VITAMIN D ) 50 MCG (2000 UT) CAPS Take 1 capsule (2,000 Units total) by mouth  daily. 06/20/19   Sowles, Krichna, MD  clotrimazole -betamethasone  (LOTRISONE ) cream Apply 1 application. topically at bedtime. 03/19/22   Loreda Hacker, DPM  Cyanocobalamin  (B-12) 1000 MCG SUBL Place 1 tablet under the tongue daily. 06/20/19   Sowles, Krichna, MD  docusate sodium (COLACE) 100 MG capsule Take 100 mg by mouth daily.    [provider]  dorzolamide (TRUSOPT) 2 % ophthalmic solution 1 drop 2 (two) times daily.  10/16/15   [provider]  ezetimibe  (ZETIA ) 10 MG tablet Take 1 tablet (10 mg total) by mouth daily. 03/20/24   Sowles, Krichna, MD  Garlic 10 MG CAPS Take 15 mg by mouth daily.     [provider]  glipiZIDE  2.5 MG TABS Take 2.5 mg by mouth daily after breakfast. 09/25/24   Sowles, Krichna, MD  ketoconazole  (NIZORAL ) 2 % cream Apply to both feet and between toes once daily for 6 weeks. 08/05/23   Gaynel Delon CROME, DPM  latanoprost (XALATAN) 0.005 % ophthalmic solution Place 1 drop into both eyes at bedtime.  03/07/19   [provider]  Magnesium 100 MG TABS Take by mouth.     [provider]  Multiple Vitamin (MULTIVITAMIN) tablet Take 1 tablet by mouth daily.    [provider]  pioglitazone  (ACTOS ) 15 MG tablet Take 1  tablet (15 mg total) by mouth daily. 09/25/24   Sowles, Krichna, MD  polyethylene glycol powder (GLYCOLAX /MIRALAX ) 17 GM/SCOOP powder Take 17 g by mouth 2 (two) times daily as needed. Dissolve 1 capful (17g) in 4-8 ounces of liquid and take by mouth daily. 09/25/24   Sowles, Krichna, MD  polyethylene glycol powder (GLYCOLAX /MIRALAX ) 17 GM/SCOOP powder Take 17 g by mouth daily. 09/25/24   Sowles, Krichna, MD  rOPINIRole  (REQUIP ) 0.5 MG tablet Take 1 tablet (0.5 mg total) by mouth at bedtime. 09/25/24   Sowles, Krichna, MD  sildenafil  (REVATIO ) 20 MG tablet Take 1-5 tablets one hour prior to intercourse as needed. 09/23/23   Helon Kirsch A, PA-C  tamsulosin  (FLOMAX ) 0.4 MG CAPS capsule Take 1 capsule (0.4 mg  total) by mouth daily. 03/14/24   Penne Knee, MD  triamcinolone (KENALOG) 0.025 % cream Apply topically. 07/31/20   [provider]  VASCEPA  1 g capsule Take 2 capsules (2 g total) by mouth 2 (two) times daily. 09/25/24   Sowles, Krichna, MD  vitamin C (ASCORBIC ACID) 500 MG tablet Take 500 mg by mouth 2 (two) times daily. Chewable    [provider]  Zinc Acetate 50 MG CAPS Take 1 capsule by mouth daily.     [provider]    Allergies as of 10/27/2024 - Review Complete 09/25/2024  Allergen Reaction Noted   Colesevelam Other (See Comments) 09/12/2015   Penicillins Itching and Other (See Comments) 09/13/2013   Statins Other (See Comments) 04/09/2015   Welchol [colesevelam hcl]  04/09/2015    Family History  Problem Relation Age of Onset   Diabetes Sister    Diabetes Brother    Diabetes Sister    Prostate cancer Neg Hx    Bladder Cancer Neg Hx     Social History   Socioeconomic History   Marital status: Widowed    Spouse name: Delayne   Number of children: 4   Years of education: some college   Highest education level: 12th grade  Occupational History    Employer: RETIRED  Tobacco Use   Smoking status: Never   Smokeless tobacco: Never   Tobacco comments:    smoking cessation materials not required  Vaping Use   Vaping status: Never Used  Substance and Sexual Activity   Alcohol use: No   Drug use: No   Sexual activity: Yes  Other Topics Concern   Not on file  Social History Narrative   Not on file   Social Drivers of Health   Tobacco Use: Low Risk  (10/31/2024)   Received from [facility]   Patient History    Smoking Tobacco Use: Never    Smokeless Tobacco Use: Never    Passive Exposure: Not on file  Financial Resource Strain: Low Risk  (10/24/2024)   Received from [facility]   Overall Financial Resource Strain (CARDIA)    Difficulty of Paying Living Expenses: Not hard at all  Food  Insecurity: No Food Insecurity (10/24/2024)   Received from [facility]   Epic    Within the past 12 months, you worried that your food would run out before you got the money to buy more.: Never true    Within the past 12 months, the food you bought just didn't last and you didn't have money to get more.: Never true  Transportation Needs: No Transportation Needs (10/24/2024)   Received from [facility]   [facility] - Transportation  In the past 12 months, has lack of transportation kept you from medical appointments or from getting medications?: No    Lack of Transportation (Non-Medical): No  Physical Activity: Sufficiently Active (08/27/2023)   Exercise Vital Sign    Days of Exercise per Week: 5 days    Minutes of Exercise per Session: 40 min  Stress: No Stress Concern Present (08/27/2023)   [employer] of Occupational [employer] - Occupational Stress Questionnaire    Feeling of Stress : Not at all  Social Connections: Moderately Integrated (08/27/2023)   Social Connection and Isolation Panel    Frequency of Communication with Friends and Family: More than three times a week    Frequency of Social Gatherings with Friends and Family: More than three times a week    Attends Religious Services: More than 4 times per year    Active Member of [employer] or Organizations: No    Attends Banker Meetings: Never    Marital Status: Married  Catering Manager Violence: Not At Risk (08/27/2023)   Humiliation, Afraid, Rape, and Kick questionnaire    Fear of Current or Ex-Partner: No    Emotionally Abused: No    Physically Abused: No    Sexually Abused: No  Depression (PHQ2-9): Low Risk (09/25/2024)   Depression (PHQ2-9)    PHQ-2 Score: 0  Alcohol Screen: Low Risk (08/27/2023)   Alcohol Screen    Last Alcohol Screening Score (AUDIT): 0  Housing: Low Risk  (10/24/2024)   Received from [facility]   Epic    In the last 12 months,  was there a time when you were not able to pay the mortgage or rent on time?: No    In the past 12 months, how many times have you moved where you were living?: 0    At any time in the past 12 months, were you homeless or living in a shelter (including now)?: No  Utilities: Not At Risk (10/24/2024)   Received from [facility]   Epic    In the past 12 months has the electric, gas, oil, or water company threatened to shut off services in your home?: No  Health Literacy: Adequate Health Literacy (08/27/2023)   B1300 Health Literacy    Frequency of need for help with medical instructions: Never    Review of Systems: See HPI, otherwise negative ROS  Physical Exam: BP (!) 131/54   Temp (!) 96.2 F (35.7 C) (Tympanic)   Resp 20   Ht 5' 8 (1.727 m)   Wt 56.4 kg   SpO2 100%   BMI 18.91 kg/m  General:   Alert,  pleasant and cooperative in NAD Head:  Normocephalic and atraumatic. Neck:  Supple; no masses or thyromegaly. Lungs:  Clear throughout to auscultation, normal respiratory effort.    Heart:  +S1, +S2, Regular rate and rhythm, No edema. Abdomen:  Soft, nontender and nondistended. Normal bowel sounds, without guarding, and without rebound.   Neurologic:  Alert and  oriented x4;  grossly normal neurologically.  Impression/Plan: Austin Hunt is here for an endoscopy  to be performed for  evaluation of dysphagia    Risks, benefits, limitations, and alternatives regarding endoscopy have been reviewed with the patient.  Questions have been answered.  All parties agreeable.   Ruel Kung, MD  11/03/2024, 8:44 AM  "

## 2024-11-03 NOTE — Transfer of Care (Signed)
 Immediate Anesthesia Transfer of Care Note  Patient: Austin Hunt  Procedure(s) Performed: EGD (ESOPHAGOGASTRODUODENOSCOPY) BALLOON DILATION  Patient Location: PACU and Endoscopy Unit  Anesthesia Type:General  Level of Consciousness: sedated  Airway & Oxygen Therapy: Patient Spontanous Breathing  Post-op Assessment: Report given to RN  Post vital signs: Reviewed and stable  Last Vitals:  Vitals Value Taken Time  BP 89/54 11/03/24 09:42  Temp    Pulse 60 11/03/24 09:42  Resp 15 11/03/24 09:42  SpO2 100 % 11/03/24 09:42  Vitals shown include unfiled device data.  Last Pain:  Vitals:   11/03/24 0817  TempSrc: Tympanic  PainSc: 0-No pain         Complications: No notable events documented.

## 2024-11-03 NOTE — Op Note (Signed)
 [facility] Patient Name: Austin Hunt Procedure Date: 11/03/2024 9:22 AM MRN: [id number] Account #: [id number] Date of Birth: January 09, 1941 Admit Type: Outpatient Age: 84 Room: [facility] ENDO ROOM 1 Gender: Male Note Status: Finalized Instrument Name: Barnie GI Scope [id number] Procedure:             Upper GI endoscopy Indications:           Dysphagia Providers:             Ruel Kung MD, MD Referring MD:          Dorette FALCON. Sowles, MD (Referring MD) Medicines:             Monitored Anesthesia Care Complications:         No immediate complications. Procedure:             Pre-Anesthesia Assessment:                        - Prior to the procedure, a History and Physical was                         performed, and patient medications, allergies and                         sensitivities were reviewed. The patient's tolerance                         of previous anesthesia was reviewed.                        - The risks and benefits of the procedure and the                         sedation options and risks were discussed with the                         patient. All questions were answered and informed                         consent was obtained.                        - ASA Grade Assessment: II - A patient with mild                         systemic disease.                        After obtaining informed consent, the endoscope was                         passed under direct vision. Throughout the procedure,                         the patient's blood pressure, pulse, and oxygen                         saturations were monitored continuously. The Endoscope                         was  introduced through the mouth, and advanced to the                         third part of duodenum. The upper GI endoscopy was                         accomplished with ease. The patient tolerated the                         procedure well. Findings:      The examined esophagus was  normal. A TTS dilator was passed through the       scope. Dilation with a 15-16.5-18 mm balloon dilator was performed to 18       mm. The dilation site was examined and showed no change. Biopsies were       taken with a cold forceps for histology. Baloon dragged while inflated       to 18 mm along entire length of the esophagus,. no resistance      The examined duodenum was normal.      Diffuse moderate inflammation characterized by congestion (edema),       erosions and erythema was found on the greater curvature of the stomach,       on the greater curvature of the gastric antrum and in the prepyloric       region of the stomach. Biopsies were taken with a cold forceps for       histology.      The cardia and gastric fundus were normal on retroflexion. Impression:            - Normal esophagus. Dilated. Biopsied.                        - Normal examined duodenum.                        - Gastritis. Biopsied. Recommendation:        - Await pathology results.                        - Discharge patient to home (with escort).                        - Resume previous diet.                        - Continue present medications.                        - Await pathology results.                        - Return to my office PRN. Procedure Code(s):     --- Professional ---                        [phone], Esophagogastroduodenoscopy, flexible,                         transoral; with transendoscopic balloon dilation of                         esophagus (less than 30 mm  diameter)                        43239, 59, Esophagogastroduodenoscopy, flexible,                         transoral; with biopsy, single or multiple Diagnosis Code(s):     --- Professional ---                        K29.70, Gastritis, unspecified, without bleeding                        R13.10, Dysphagia, unspecified CPT copyright 2022 American Medical Association. All rights reserved. The codes documented in this report are  preliminary and upon coder review may  be revised to meet current compliance requirements. Ruel Kung, MD Ruel Kung MD, MD 11/03/2024 9:40:49 AM This report has been signed electronically. Number of Addenda: 0 Note Initiated On: 11/03/2024 9:22 AM Estimated Blood Loss:  Estimated blood loss: none.      [facility]

## 2024-11-03 NOTE — Anesthesia Preprocedure Evaluation (Signed)
 "                                  Anesthesia Evaluation  Patient identified by MRN, date of birth, ID band Patient awake    Reviewed: Allergy & Precautions, NPO status , Patient's Chart, lab work & pertinent test results  Airway Mallampati: II  TM Distance: >3 FB Neck ROM: Full    Dental  (+) Lower Dentures   Pulmonary neg pulmonary ROS   Pulmonary exam normal        Cardiovascular Exercise Tolerance: Good + CAD  negative cardio ROS Normal cardiovascular exam Rhythm:Regular Rate:Normal     Neuro/Psych  Headaches negative neurological ROS  negative psych ROS   GI/Hepatic negative GI ROS, Neg liver ROS,,Patient received Oral Contrast Agents,  Endo/Other  negative endocrine ROSdiabetes, Type 2    Renal/GU negative Renal ROS  negative genitourinary   Musculoskeletal negative musculoskeletal ROS (+)    Abdominal Normal abdominal exam  (+)   Peds negative pediatric ROS (+)  Hematology negative hematology ROS (+)   Anesthesia Other Findings Past Medical History: No date: Arthritis     Comment:  neck - no limitations No date: Balanitis No date: BPH (benign prostatic hypertrophy) with urinary obstruction No date: Calculus in bladder No date: Diabetes mellitus without complication (HCC) No date: Glaucoma No date: Headache     Comment:  rare - 1x/mo No date: Nocturia No date: Prostate cancer [facility])  Past Surgical History: 1954: APPENDECTOMY Oct 2015: BLADDER SURGERY 2002: CARDIAC CATHETERIZATION     Comment:  1 stent No date: COLONOSCOPY 2011, 2013: EYE SURGERY     Comment:  cataracts May 2016: EYE SURGERY; Right     Comment:  glaucoma 1967: HERNIA REPAIR; Left 05-10-14: HERNIA REPAIR; Right     Comment:  Right femoral hernia repair Dr Dessa with PerFix plug,              no onlay mesh..  12/30/2015: PHOTOCOAGULATION WITH LASER; Right     Comment:  Procedure: PHOTOCOAGULATION WITH LASER;  Surgeon: Donzell Arlyce Budd, MD;   Location: [facility];                Service: Ophthalmology;  Laterality: Right;  DIABETIC -               oral meds IVA BLOCK Oct 2015: PROSTATE SURGERY  BMI    Body Mass Index: 18.91 kg/m      Reproductive/Obstetrics negative OB ROS                              Anesthesia Physical Anesthesia Plan  ASA: 2  Anesthesia Plan: General   Post-op Pain Management:    Induction: Intravenous  PONV Risk Score and Plan: Propofol infusion and TIVA  Airway Management Planned: Natural Airway and Nasal Cannula  Additional Equipment:   Intra-op Plan:   Post-operative Plan:   Informed Consent: I have reviewed the patients History and Physical, chart, labs and discussed the procedure including the risks, benefits and alternatives for the proposed anesthesia with the patient or authorized representative who has indicated his/her understanding and acceptance.     Dental Advisory Given  Plan Discussed with: CRNA  Anesthesia Plan Comments:         Anesthesia Quick  Evaluation  "

## 2024-11-06 LAB — SURGICAL PATHOLOGY

## 2024-11-07 ENCOUNTER — Ambulatory Visit: Attending: Family Medicine

## 2024-11-07 ENCOUNTER — Ambulatory Visit (INDEPENDENT_AMBULATORY_CARE_PROVIDER_SITE_OTHER): Admitting: Family Medicine

## 2024-11-07 ENCOUNTER — Encounter: Payer: Self-pay | Admitting: Family Medicine

## 2024-11-07 VITALS — BP 116/68 | HR 48 | Resp 16 | Ht 68.0 in | Wt 123.5 lb

## 2024-11-07 DIAGNOSIS — R001 Bradycardia, unspecified: Secondary | ICD-10-CM | POA: Diagnosis not present

## 2024-11-07 DIAGNOSIS — Z7984 Long term (current) use of oral hypoglycemic drugs: Secondary | ICD-10-CM | POA: Diagnosis not present

## 2024-11-07 DIAGNOSIS — E1142 Type 2 diabetes mellitus with diabetic polyneuropathy: Secondary | ICD-10-CM

## 2024-11-07 DIAGNOSIS — I2510 Atherosclerotic heart disease of native coronary artery without angina pectoris: Secondary | ICD-10-CM | POA: Diagnosis not present

## 2024-11-07 NOTE — Progress Notes (Signed)
 Name: Austin Hunt   MRN: [id number]    DOB: [birth year]   Date:11/07/2024       Progress Note  Subjective  Chief Complaint  Chief Complaint  Patient presents with   Medical Management of Chronic Issues   Discussed the use of AI scribe software for clinical note transcription with the patient, who gave verbal consent to proceed.  History of Present Illness Austin Hunt is an 84 year old male with type 2 diabetes and coronary artery disease who presents for follow-up on medication adjustments and heart rate monitoring.  He has a history of type 2 diabetes and coronary artery disease with stent placement. He recently stopped taking metformin  and is currently on glipizide , taking half of a small pill daily. His blood sugar levels have been stable, ranging from 125 to 130 mg/dL, without episodes of hypoglycemia or symptoms such as shakiness or sweating.  He reports a slow heart rate, with a previous recording of 60 beats per minute during a cardiology visit on December 30th. He is not on medications known to slow heart rate, such as beta blockers, and no longer takes metoprolol or ramipril  for HTN due to low bp. . No symptoms of syncope or significant fatigue lately .  He experiences occasional swelling in his  legs, particularly noticeable when wearing socks, which resolves upon removal. He has a history of neuropathy associated with diabetes, currently well-managed without significant pain or discomfort.  He has a history of prostate issues, having undergone a transurethral resection of the prostate (TURP) for an enlarged prostate, but has never had prostate cancer. He is taking lyrica   His social history includes family interactions during the holidays, with his mother visiting and his son residing in [place].    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) 06/21/2020   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) 06/21/2020   Cervical pain (neck) 10/13/2016   Other neutropenia 01/07/2016   Carotid artery narrowing 04/08/2015   Chronic idiopathic constipation 04/08/2015   H/O inguinal hernia repair 04/08/2015   Hyperlipidemia associated with type 2 diabetes mellitus (HCC) 04/08/2015   Low back pain 04/08/2015   MI (mitral incompetence) 04/08/2015   TI (tricuspid incompetence) 04/08/2015   Unilateral recurrent femoral hernia without obstruction or gangrene 05/23/2014   Right inguinal hernia 02/06/2014   Cataract 01/16/2014   Glaucoma 01/16/2014   Reflux esophagitis 06/21/2009   Atherosclerosis of native coronary artery of native heart without angina pectoris 04/03/2009   Benign enlargement of prostate 02/11/2009   Decreased libido 01/12/2008    Past Surgical History:  Procedure Laterality Date   APPENDECTOMY  1954   BALLOON DILATION  11/03/2024   Procedure: BALLOON DILATION;  Surgeon: Therisa Bi, MD;  Location: [facility] ENDOSCOPY;  Service: Gastroenterology;;   BIOPSY OF SKIN SUBCUTANEOUS TISSUE AND/OR MUCOUS MEMBRANE  11/03/2024   Procedure: BIOPSY, GI;  Surgeon: Therisa Bi, MD;  Location: [facility] ENDOSCOPY;  Service: Gastroenterology;;   BLADDER SURGERY  Oct 2015   CARDIAC CATHETERIZATION  2002   1 stent   COLONOSCOPY     ESOPHAGOGASTRODUODENOSCOPY N/A 11/03/2024   Procedure: EGD (ESOPHAGOGASTRODUODENOSCOPY);  Surgeon:  Therisa Bi, MD;  Location: [facility] ENDOSCOPY;  Service: Gastroenterology;  Laterality: N/A;   EYE SURGERY  2011, 2013   cataracts   EYE SURGERY Right May 2016   glaucoma   HERNIA REPAIR Left 1967   HERNIA REPAIR Right 05-10-14   Right femoral hernia repair Dr Dessa with PerFix  plug, no onlay mesh.SABRA    PHOTOCOAGULATION WITH LASER Right 12/30/2015   Procedure: PHOTOCOAGULATION WITH LASER;  Surgeon: Donzell Arlyce Budd, MD;  Location: [facility];  Service: Ophthalmology;  Laterality: Right;  DIABETIC - oral meds IVA BLOCK   PROSTATE SURGERY  Oct 2015    Family History  Problem Relation Age of Onset   Diabetes Sister    Diabetes Brother    Diabetes Sister    Prostate cancer Neg Hx    Bladder Cancer Neg Hx     Social History   Tobacco Use   Smoking status: Never   Smokeless tobacco: Never   Tobacco comments:    smoking cessation materials not required  Substance Use Topics   Alcohol use: No    Current Medications[1]  Allergies[2]  I personally reviewed active problem list, medication list, allergies, family history with the patient/caregiver today.   ROS  Ten systems reviewed and is negative except as mentioned in HPI    Objective Physical Exam VITALS: P- 48, BP- 116/68 CONSTITUTIONAL: Patient appears well-developed and well-nourished. No distress. HEENT: Head atraumatic, normocephalic, neck supple. CARDIOVASCULAR: Bradycardia with heart rate of 48. Normal heart sounds. No murmur heard. No BLE edema. Foot moisturized with callus present on little toe. PULMONARY: Effort normal and breath sounds normal. No respiratory distress. ABDOMINAL: There is no tenderness or distention. MUSCULOSKELETAL: Normal gait. Without gross motor or sensory deficit. PSYCHIATRIC: Patient has a normal mood and affect. Behavior is normal. Judgment and thought content normal.  Vitals:   11/07/24 1256  BP: 116/68  Pulse: (!) 48  Resp: 16  SpO2: 97%  Weight: 123 lb 8 oz (56 kg)  Height: 5' 8 (1.727 m)    Body mass index is 18.78 kg/m.  Recent Results (from the past 2160 hours)  POCT glycosylated hemoglobin (Hb A1C)     Status: Abnormal   Collection Time: 09/25/24 10:49 AM  Result Value Ref Range   Hemoglobin A1C 7.2 (A) 4.0 - 5.6 %   HbA1c POC  (<> result, manual entry)     HbA1c, POC (prediabetic range)     HbA1c, POC (controlled diabetic range)    Surgical pathology     Status: None   Collection Time: 11/03/24 12:00 AM  Result Value Ref Range   SURGICAL PATHOLOGY      SURGICAL PATHOLOGY [facility] [address] [place], [place] [address] Telephone [phone] or [phone] Fax [phone]  REPORT OF SURGICAL PATHOLOGY   Accession #: [id number] Patient Name: MICHAIAH, HOLSOPPLE Visit # : [id number]  MRN: [id number] Physician: Therisa Bi DOB/Age [birth year] (Age: 65) Gender: M Collected Date: 11/03/2024 Received Date: 11/03/2024  FINAL DIAGNOSIS       1. Stomach, biopsy, random, cbx :       - REACTIVE GASTROPATHY      - NEGATIVE FOR H. PYLORI ON H&E STAIN      - NEGATIVE FOR INTESTINAL METAPLASIA OR MALIGNANCY       2. Esophagus, biopsy, cbx :       - SQUAMOUS MUCOSA WITH MILD REACTIVE CHANGES      - NEGATIVE FOR INCREASED INTRAEPITHELIAL EOSINOPHILS      -  NEGATIVE FOR DYSPLASIA OR MALIGNANCY       ELECTRONIC SIGNATURE : Sharma M.D., Nupur, Pathologist, Electronic Signature  MICROSCOPIC DESCRIPTION  CASE COMMENTS STAINS USED IN DIAGNOSIS: H&E H&E    CLINICAL HISTORY  SPECIME N(S) OBTAINED 1. Stomach, biopsy, Random, Cbx 2. Esophagus, biopsy, Cbx  SPECIMEN COMMENTS: 1. For gastritis 2. For eosinophilic esophagitis SPECIMEN CLINICAL INFORMATION: 1. Dysphagia, unspecified type    Gross Description 1. Received in formalin are tan, soft tissue fragments that are submitted in toto.Number:  2  Size:  0.5 cm,  1 block. 2. Received in formalin are tan, soft tissue fragments that are submitted in toto.Number:  6  Size:  0.4 cm,  1 block.mb 11-03-24        Report signed out from the following location(s) . [facility] [address] [place], [place] [address] CLIA #: [id number]  [facility] [address] [place],  [place] [address] CLIA #: [id number]   Glucose, capillary     Status: Abnormal   Collection Time: 11/03/24  8:16 AM  Result Value Ref Range   Glucose-Capillary 117 (H) 70 - 99 mg/dL    Comment: Glucose reference range applies only to samples taken after fasting for at least 8 hours.    Diabetic Foot Exam:     PHQ2/9:    11/07/2024   12:54 PM 09/25/2024   10:36 AM 05/23/2024   10:51 AM 03/16/2024    9:37 AM 01/14/2024    9:23 AM  Depression screen PHQ 2/9  Decreased Interest 0 0 0 0 0  Down, Depressed, Hopeless 0 0 0 0 0  PHQ - 2 Score 0 0 0 0 0  Altered sleeping     0  Tired, decreased energy     0  Change in appetite     0  Feeling bad or failure about yourself      0  Trouble concentrating     0  Moving slowly or fidgety/restless     0  Suicidal thoughts     0  PHQ-9 Score     0   Difficult doing work/chores     Not difficult at all     Data saved with a previous flowsheet row definition    phq 9 is negative  Fall Risk:    11/07/2024   12:53 PM 09/25/2024   10:36 AM 05/23/2024   10:51 AM 03/16/2024    9:37 AM 01/14/2024    9:23 AM  Fall Risk   Falls in the past year? 0 0 0 0 0  Number falls in past yr: 0 0 0 0 0  Injury with Fall? 0 0  0  0  0   Risk for fall due to : No Fall Risks No Fall Risks No Fall Risks No Fall Risks No Fall Risks  Follow up Falls evaluation completed Falls evaluation completed Falls evaluation completed Falls prevention discussed;Education provided;Falls evaluation completed Falls prevention discussed;Education provided;Falls evaluation completed     Data saved with a previous flowsheet row definition    Assessment & Plan Bradycardia Heart rate 48 bpm, no symptoms, no medications affecting heart rate, history of coronary artery disease with stent. - Ordered Zio patch for continuous heart rhythm monitoring. - Advised to maintain usual exercise and dietary habits during monitoring.  Type 2 diabetes mellitus with diabetic polyneuropathy Managed with  glipizide , stable blood glucose, no hypoglycemia, weight gain noted, neuropathy not significantly impairing. - Continue glipizide  at current dose. -  Scheduled follow-up in March to check A1c levels.  Coronary artery disease Stent placement, recent cardiology follow-up, no angina or chest pain. - Continue follow-up with cardiologist as scheduled.        [1]  Current Outpatient Medications:    ACCU-CHEK GUIDE TEST test strip, USE 4 TIMES A DAY AS DIRECTED, Disp: 100 each, Rfl: 10   Accu-Chek Softclix Lancets lancets, TEST 4 TIMES DAILY, Disp: 100 each, Rfl: 5   aspirin 81 MG tablet, Take 81 mg by mouth daily. , Disp: , Rfl:    Blood Glucose Monitoring Suppl (ACCU-CHEK GUIDE ME) w/Device KIT, , Disp: , Rfl:    brimonidine-timolol (COMBIGAN) 0.2-0.5 % ophthalmic solution, Place 1 drop into both eyes every 12 (twelve) hours., Disp: , Rfl:    Cholecalciferol (VITAMIN D ) 50 MCG (2000 UT) CAPS, Take 1 capsule (2,000 Units total) by mouth daily., Disp: 30 capsule, Rfl: 0   clotrimazole -betamethasone  (LOTRISONE ) cream, Apply 1 application. topically at bedtime., Disp: 30 g, Rfl: 0   Cyanocobalamin  (B-12) 1000 MCG SUBL, Place 1 tablet under the tongue daily., Disp: 30 tablet, Rfl: 0   docusate sodium (COLACE) 100 MG capsule, Take 100 mg by mouth daily., Disp: , Rfl:    dorzolamide (TRUSOPT) 2 % ophthalmic solution, 1 drop 2 (two) times daily. , Disp: , Rfl:    ezetimibe  (ZETIA ) 10 MG tablet, Take 1 tablet (10 mg total) by mouth daily., Disp: 90 tablet, Rfl: 3   Garlic 10 MG CAPS, Take 15 mg by mouth daily. , Disp: , Rfl:    glipiZIDE  2.5 MG TABS, Take 2.5 mg by mouth daily after breakfast., Disp: 90 tablet, Rfl: 0   ketoconazole  (NIZORAL ) 2 % cream, Apply to both feet and between toes once daily for 6 weeks., Disp: 60 g, Rfl: 1   latanoprost (XALATAN) 0.005 % ophthalmic solution, Place 1 drop into both eyes at bedtime. , Disp: , Rfl:    Magnesium 100 MG TABS, Take by mouth. , Disp: , Rfl:     Multiple Vitamin (MULTIVITAMIN) tablet, Take 1 tablet by mouth daily., Disp: , Rfl:    pioglitazone  (ACTOS ) 15 MG tablet, Take 1 tablet (15 mg total) by mouth daily., Disp: 90 tablet, Rfl: 1   polyethylene glycol powder (GLYCOLAX /MIRALAX ) 17 GM/SCOOP powder, Take 17 g by mouth 2 (two) times daily as needed. Dissolve 1 capful (17g) in 4-8 ounces of liquid and take by mouth daily., Disp: 3350 g, Rfl: 1   polyethylene glycol powder (GLYCOLAX /MIRALAX ) 17 GM/SCOOP powder, Take 17 g by mouth daily., Disp: 3350 g, Rfl: 1   rOPINIRole  (REQUIP ) 0.5 MG tablet, Take 1 tablet (0.5 mg total) by mouth at bedtime., Disp: 90 tablet, Rfl: 1   tamsulosin  (FLOMAX ) 0.4 MG CAPS capsule, Take 1 capsule (0.4 mg total) by mouth daily., Disp: 90 capsule, Rfl: 3   triamcinolone (KENALOG) 0.025 % cream, Apply topically., Disp: , Rfl:    VASCEPA  1 g capsule, Take 2 capsules (2 g total) by mouth 2 (two) times daily., Disp: 480 capsule, Rfl: 1   vitamin C (ASCORBIC ACID) 500 MG tablet, Take 500 mg by mouth 2 (two) times daily. Chewable, Disp: , Rfl:    Zinc Acetate 50 MG CAPS, Take 1 capsule by mouth daily. , Disp: , Rfl:  [2]  Allergies Allergen Reactions   Colesevelam Other (See Comments)   Penicillins Itching and Other (See Comments)   Statins Other (See Comments)    myalgia   Welchol [Colesevelam Hcl]

## 2024-11-08 ENCOUNTER — Ambulatory Visit: Payer: Self-pay | Admitting: Gastroenterology

## 2024-11-17 ENCOUNTER — Ambulatory Visit

## 2024-11-24 ENCOUNTER — Ambulatory Visit

## 2024-12-07 ENCOUNTER — Other Ambulatory Visit (HOSPITAL_COMMUNITY): Payer: Self-pay

## 2024-12-08 ENCOUNTER — Other Ambulatory Visit (HOSPITAL_COMMUNITY): Payer: Self-pay

## 2024-12-21 ENCOUNTER — Ambulatory Visit

## 2025-01-23 ENCOUNTER — Ambulatory Visit: Admitting: Family Medicine

## 2025-05-28 ENCOUNTER — Ambulatory Visit: Admitting: Urology
